# Patient Record
Sex: Female | Born: 1940 | Race: Black or African American | Hispanic: No | Marital: Single | Smoking: Former smoker
Health system: Southern US, Community
[De-identification: ages and names within clinical notes are randomized; demographics above are authoritative.]

## PROBLEM LIST (undated history)

## (undated) DIAGNOSIS — K829 Disease of gallbladder, unspecified: Secondary | ICD-10-CM

## (undated) DIAGNOSIS — I10 Essential (primary) hypertension: Secondary | ICD-10-CM

## (undated) DIAGNOSIS — E538 Deficiency of other specified B group vitamins: Secondary | ICD-10-CM

## (undated) DIAGNOSIS — M7989 Other specified soft tissue disorders: Secondary | ICD-10-CM

## (undated) DIAGNOSIS — E079 Disorder of thyroid, unspecified: Secondary | ICD-10-CM

## (undated) DIAGNOSIS — J449 Chronic obstructive pulmonary disease, unspecified: Secondary | ICD-10-CM

## (undated) DIAGNOSIS — K589 Irritable bowel syndrome without diarrhea: Secondary | ICD-10-CM

## (undated) DIAGNOSIS — E559 Vitamin D deficiency, unspecified: Secondary | ICD-10-CM

## (undated) DIAGNOSIS — G629 Polyneuropathy, unspecified: Secondary | ICD-10-CM

## (undated) DIAGNOSIS — D219 Benign neoplasm of connective and other soft tissue, unspecified: Secondary | ICD-10-CM

## (undated) DIAGNOSIS — E7800 Pure hypercholesterolemia, unspecified: Secondary | ICD-10-CM

## (undated) DIAGNOSIS — I2699 Other pulmonary embolism without acute cor pulmonale: Secondary | ICD-10-CM

## (undated) DIAGNOSIS — F419 Anxiety disorder, unspecified: Secondary | ICD-10-CM

## (undated) DIAGNOSIS — M2550 Pain in unspecified joint: Secondary | ICD-10-CM

## (undated) DIAGNOSIS — K5900 Constipation, unspecified: Secondary | ICD-10-CM

## (undated) DIAGNOSIS — K529 Noninfective gastroenteritis and colitis, unspecified: Secondary | ICD-10-CM

## (undated) DIAGNOSIS — I8390 Asymptomatic varicose veins of unspecified lower extremity: Secondary | ICD-10-CM

## (undated) DIAGNOSIS — F32A Depression, unspecified: Secondary | ICD-10-CM

## (undated) DIAGNOSIS — I82409 Acute embolism and thrombosis of unspecified deep veins of unspecified lower extremity: Secondary | ICD-10-CM

## (undated) DIAGNOSIS — I38 Endocarditis, valve unspecified: Secondary | ICD-10-CM

## (undated) DIAGNOSIS — R0602 Shortness of breath: Secondary | ICD-10-CM

## (undated) DIAGNOSIS — Z8619 Personal history of other infectious and parasitic diseases: Secondary | ICD-10-CM

## (undated) HISTORY — DX: Depression, unspecified: F32.A

## (undated) HISTORY — PX: OOPHORECTOMY: SHX86

## (undated) HISTORY — DX: Irritable bowel syndrome, unspecified: K58.9

## (undated) HISTORY — DX: Disease of gallbladder, unspecified: K82.9

## (undated) HISTORY — DX: Benign neoplasm of connective and other soft tissue, unspecified: D21.9

## (undated) HISTORY — PX: TOTAL THYROIDECTOMY: SHX2547

## (undated) HISTORY — DX: Noninfective gastroenteritis and colitis, unspecified: K52.9

## (undated) HISTORY — DX: Essential (primary) hypertension: I10

## (undated) HISTORY — DX: Personal history of other infectious and parasitic diseases: Z86.19

## (undated) HISTORY — DX: Asymptomatic varicose veins of unspecified lower extremity: I83.90

## (undated) HISTORY — PX: ABDOMINAL HYSTERECTOMY: SHX81

## (undated) HISTORY — DX: Deficiency of other specified B group vitamins: E53.8

## (undated) HISTORY — DX: Chronic obstructive pulmonary disease, unspecified: J44.9

## (undated) HISTORY — DX: Disorder of thyroid, unspecified: E07.9

## (undated) HISTORY — DX: Vitamin D deficiency, unspecified: E55.9

## (undated) HISTORY — DX: Pain in unspecified joint: M25.50

## (undated) HISTORY — DX: Other specified soft tissue disorders: M79.89

## (undated) HISTORY — DX: Shortness of breath: R06.02

## (undated) HISTORY — DX: Polyneuropathy, unspecified: G62.9

## (undated) HISTORY — DX: Pure hypercholesterolemia, unspecified: E78.00

## (undated) HISTORY — DX: Endocarditis, valve unspecified: I38

## (undated) HISTORY — DX: Constipation, unspecified: K59.00

## (undated) HISTORY — DX: Anxiety disorder, unspecified: F41.9

---

## 1998-04-22 ENCOUNTER — Ambulatory Visit (HOSPITAL_COMMUNITY): Admission: RE | Admit: 1998-04-22 | Discharge: 1998-04-22 | Payer: Self-pay | Admitting: Podiatry

## 1998-04-27 ENCOUNTER — Other Ambulatory Visit: Admission: RE | Admit: 1998-04-27 | Discharge: 1998-04-27 | Payer: Self-pay | Admitting: Podiatry

## 1998-10-20 ENCOUNTER — Other Ambulatory Visit: Admission: RE | Admit: 1998-10-20 | Discharge: 1998-10-20 | Payer: Self-pay | Admitting: Obstetrics and Gynecology

## 2000-02-13 ENCOUNTER — Encounter: Admission: RE | Admit: 2000-02-13 | Discharge: 2000-02-13 | Payer: Self-pay | Admitting: Internal Medicine

## 2000-02-13 ENCOUNTER — Encounter: Payer: Self-pay | Admitting: Internal Medicine

## 2000-07-04 ENCOUNTER — Other Ambulatory Visit: Admission: RE | Admit: 2000-07-04 | Discharge: 2000-07-04 | Payer: Self-pay | Admitting: Internal Medicine

## 2003-11-15 ENCOUNTER — Inpatient Hospital Stay (HOSPITAL_COMMUNITY): Admission: EM | Admit: 2003-11-15 | Discharge: 2003-11-16 | Payer: Self-pay | Admitting: Emergency Medicine

## 2003-11-16 ENCOUNTER — Encounter: Payer: Self-pay | Admitting: *Deleted

## 2005-09-24 ENCOUNTER — Encounter: Admission: RE | Admit: 2005-09-24 | Discharge: 2005-09-24 | Payer: Self-pay | Admitting: Internal Medicine

## 2009-05-14 ENCOUNTER — Inpatient Hospital Stay (HOSPITAL_COMMUNITY): Admission: EM | Admit: 2009-05-14 | Discharge: 2009-05-17 | Payer: Self-pay | Admitting: Emergency Medicine

## 2010-03-09 ENCOUNTER — Encounter
Admission: RE | Admit: 2010-03-09 | Discharge: 2010-03-09 | Payer: Self-pay | Source: Home / Self Care | Attending: Internal Medicine | Admitting: Internal Medicine

## 2010-04-27 ENCOUNTER — Ambulatory Visit (HOSPITAL_COMMUNITY)
Admission: RE | Admit: 2010-04-27 | Discharge: 2010-04-27 | Disposition: A | Discharge status: Home / Self Care | Payer: Medicare Other | Source: Ambulatory Visit | Attending: Gastroenterology | Admitting: Gastroenterology

## 2010-04-27 DIAGNOSIS — I10 Essential (primary) hypertension: Secondary | ICD-10-CM | POA: Insufficient documentation

## 2010-04-27 DIAGNOSIS — Z1211 Encounter for screening for malignant neoplasm of colon: Secondary | ICD-10-CM | POA: Insufficient documentation

## 2010-05-01 NOTE — Op Note (Signed)
  NAME:  Denise Cline, Denise Cline NO.:  [id number]  MEDICAL RECORD NO.:  [id number]           PATIENT TYPE:  O  LOCATION:  WLEN                         FACILITY:  [facility]  PHYSICIAN:  Danise Edge, M.D.   DATE OF BIRTH:  1940/04/07  DATE OF PROCEDURE:  04/27/2010 DATE OF DISCHARGE:                              OPERATIVE REPORT   HISTORY:  [name] is a 70 year old female born 1940/10/17.  The patient is scheduled to undergo a screening colonoscopy with polypectomy to prevent colon cancer.  ENDOSCOPIST:  Danise Edge, M.D.  PREMEDICATION:  Fentanyl 75 mcg, Versed 4 mg.  PROCEDURE IN DETAIL:  After obtaining informed consent, the patient was placed in the left lateral decubitus position.  Anal inspection and digital rectal exam were normal.  The Pentax pediatric colonoscope was introduced into the rectum and advanced to the cecum.  Colonic preparation for the exam today was good.  Performance of the colonoscopy was technically difficult due to colonic loop formation.  Anal inspection and digital rectal exam were normal.  Rectum normal.  Retroflexed view of the distal rectum normal. Sigmoid colon and descending colon normal. Splenic flexure normal. Transverse colon normal. Hepatic flexure normal. Ascending colon normal. Cecum and ileocecal valve normal.  ASSESSMENT:  Normal screening proctocolonoscopy to the cecum.  No endoscopic evidence for the presence of colorectal neoplasia.  RECOMMENDATIONS:  Repeat screening colonoscopy is probably not necessary.          ______________________________ Danise Edge, M.D.     MJ/MEDQ  D:  04/27/2010  T:  04/27/2010  Job:  [id number]  cc:   Candyce Churn, M.D. Fax: [phone]  Electronically Signed by Danise Edge M.D. on 05/01/2010 05:04:35 PM

## 2010-05-29 LAB — URINE CULTURE

## 2010-05-29 LAB — HEPATIC FUNCTION PANEL
ALT: 13 U/L (ref 0–35)
AST: 15 U/L (ref 0–37)
Albumin: 3.2 g/dL — ABNORMAL LOW (ref 3.5–5.2)
Alkaline Phosphatase: 34 U/L — ABNORMAL LOW (ref 39–117)
Total Protein: 6.6 g/dL (ref 6.0–8.3)

## 2010-05-29 LAB — DIFFERENTIAL
Basophils Absolute: 0 10*3/uL (ref 0.0–0.1)
Basophils Absolute: 0 10*3/uL (ref 0.0–0.1)
Basophils Absolute: 0 10*3/uL (ref 0.0–0.1)
Basophils Relative: 0 % (ref 0–1)
Basophils Relative: 1 % (ref 0–1)
Eosinophils Absolute: 0 10*3/uL (ref 0.0–0.7)
Eosinophils Relative: 1 % (ref 0–5)
Eosinophils Relative: 2 % (ref 0–5)
Lymphocytes Relative: 46 % (ref 12–46)
Lymphs Abs: 2.3 10*3/uL (ref 0.7–4.0)
Monocytes Absolute: 0.2 10*3/uL (ref 0.1–1.0)
Neutro Abs: 2.3 10*3/uL (ref 1.7–7.7)
Neutro Abs: 2.4 10*3/uL (ref 1.7–7.7)
Neutrophils Relative %: 46 % (ref 43–77)
Neutrophils Relative %: 55 % (ref 43–77)

## 2010-05-29 LAB — CBC
HCT: 36.3 % (ref 36.0–46.0)
HCT: 37.8 % (ref 36.0–46.0)
Platelets: 175 10*3/uL (ref 150–400)
Platelets: 185 10*3/uL (ref 150–400)
Platelets: 187 10*3/uL (ref 150–400)
RBC: 3.68 MIL/uL — ABNORMAL LOW (ref 3.87–5.11)
RDW: 14.5 % (ref 11.5–15.5)
WBC: 4.4 10*3/uL (ref 4.0–10.5)
WBC: 5.1 10*3/uL (ref 4.0–10.5)
WBC: 6.1 10*3/uL (ref 4.0–10.5)

## 2010-05-29 LAB — URINALYSIS, ROUTINE W REFLEX MICROSCOPIC
Bilirubin Urine: NEGATIVE
Hgb urine dipstick: NEGATIVE
Specific Gravity, Urine: 1.013 (ref 1.005–1.030)
pH: 6.5 (ref 5.0–8.0)

## 2010-05-29 LAB — COMPREHENSIVE METABOLIC PANEL
AST: 16 U/L (ref 0–37)
Albumin: 3.3 g/dL — ABNORMAL LOW (ref 3.5–5.2)
Alkaline Phosphatase: 35 U/L — ABNORMAL LOW (ref 39–117)
Alkaline Phosphatase: 41 U/L (ref 39–117)
BUN: 7 mg/dL (ref 6–23)
BUN: 9 mg/dL (ref 6–23)
CO2: 26 mEq/L (ref 19–32)
CO2: 26 mEq/L (ref 19–32)
Chloride: 103 mEq/L (ref 96–112)
Chloride: 107 mEq/L (ref 96–112)
GFR calc Af Amer: >60 mL/min (ref >60)
GFR calc non Af Amer: >60 mL/min (ref >60)
Glucose, Bld: 82 mg/dL (ref 70–99)
Potassium: 3.3 mEq/L — ABNORMAL LOW (ref 3.5–5.1)
Potassium: 3.9 mEq/L (ref 3.5–5.1)
Total Bilirubin: 0.5 mg/dL (ref 0.3–1.2)
Total Bilirubin: 0.5 mg/dL (ref 0.3–1.2)
Total Protein: 7.4 g/dL (ref 6.0–8.3)

## 2010-05-29 LAB — CARDIAC PANEL(CRET KIN+CKTOT+MB+TROPI)
CK, MB: 1.1 ng/mL (ref 0.3–4.0)
CK, MB: 1.1 ng/mL (ref 0.3–4.0)
CK, MB: 1.1 ng/mL (ref 0.3–4.0)
Relative Index: 0.7 (ref 0.0–2.5)
Relative Index: 0.8 (ref 0.0–2.5)
Troponin I: 0.01 ng/mL (ref 0.00–0.06)

## 2010-05-29 LAB — T4: T4, Total: 8 ug/dL (ref 5.0–12.5)

## 2010-05-29 LAB — LIPID PANEL
HDL: 51 mg/dL (ref >39)
Total CHOL/HDL Ratio: 3.7 RATIO
Triglycerides: 112 mg/dL (ref <150)

## 2010-05-29 LAB — URINE MICROSCOPIC-ADD ON

## 2010-05-29 LAB — MAGNESIUM: Magnesium: 1.7 mg/dL (ref 1.5–2.5)

## 2010-05-29 LAB — POCT CARDIAC MARKERS
CKMB, poc: <1 ng/mL — ABNORMAL LOW (ref 1.0–8.0)
Myoglobin, poc: 74.5 ng/mL (ref 12–200)
Troponin i, poc: <0.05 ng/mL (ref 0.00–0.09)

## 2010-05-29 LAB — TSH: TSH: 2.543 u[IU]/mL (ref 0.350–4.500)

## 2010-05-29 LAB — APTT: aPTT: 30 seconds (ref 24–37)

## 2010-05-29 LAB — BASIC METABOLIC PANEL
BUN: 7 mg/dL (ref 6–23)
Chloride: 102 mEq/L (ref 96–112)
GFR calc non Af Amer: >60 mL/min (ref >60)
Potassium: 3.4 mEq/L — ABNORMAL LOW (ref 3.5–5.1)
Sodium: 138 mEq/L (ref 135–145)

## 2010-05-29 LAB — PHOSPHORUS: Phosphorus: 3 mg/dL (ref 2.3–4.6)

## 2010-05-29 LAB — PROTIME-INR
INR: 0.96 (ref 0.00–1.49)
Prothrombin Time: 12.7 seconds (ref 11.6–15.2)

## 2010-07-21 NOTE — H&P (Signed)
NAME:  Denise Cline, Denise Cline NO.:  [id number]   MEDICAL RECORD NO.:  [id number]                   PATIENT TYPE:  EMS   LOCATION:  ED                                   FACILITY:  [facility]   PHYSICIAN:  Candyce Churn, M.D.          DATE OF BIRTH:  1940/12/26   DATE OF ADMISSION:  11/15/2003  DATE OF DISCHARGE:                                HISTORY & PHYSICAL   CHIEF COMPLAINT:  Chest pain.   HISTORY OF PRESENT ILLNESS:  Denise Cline is a very pleasant 70 year old  female with a history of current tobacco use and borderline hypertension  with recent use of Diovan/HCT, who presents with increasing fatigue with  exertion for several months.  She also reports 2-3 pillow orthopnea for  weeks to months and sudden, severe chest pain that occurred this morning at  approximately 3 a.m.  It radiated up into her neck and down her left arm.  It was associated with shortness of breath and diaphoresis.  She had  intermittent chest pain until about 9 a.m. to 10 a.m. and decided to come to  the emergency room at [facility]  Cardiac enzymes have been negative, but  history is good for unstable angina.  Her chest x-ray revealed incipient  CHF, and she was given IV Lasix, which did help her feel less chest  pressure and short of breath.  She was admitted for workup of possible  unstable angina.   MEDICATIONS:  None routinely.  She does occasionally use Advair or Spiriva  for COPD and bronchospasm.  She was recently on Diovan/HCT 100/12.5 1 daily,  but blood pressure was normal on her physical exam on October 25, 2003, and  she had been off her blood pressure medicines for three weeks.  Finally,  occasional Tylenol use for DJD pain.   ALLERGIES:  1.  PENICILLIN causes a rash.  2.  ACE INHIBITORS cause hoarseness.   She can take ARBs okay.   PAST MEDICAL HISTORY:  1.  Hypertension.  2.  Anxiety.  3.  Tobacco use for 20-30 years.  4.  Mild eczema.  5.  Episodic  bronchitis.  6.  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   PAST SURGICAL HISTORY:  1.  Partial thyroidectomy in 1991.  (Since the patient's partial      thyroidectomy, she has euthyroid).  2.  TAH and unilateral oophorectomy.  3.  Bone spur removal from right fifth metatarsal.  4.  Right bunionectomy.   HEALTH MAINTENANCE:  Patient's last mammogram was in June, 2004 at [place].   FAMILY HISTORY:  Positive for stroke, hypertension, and either ulcerative  colitis or Crohn's disease in her mother.  She does not know her father's  family history.   HABITS:  Smokes about a pack per day for more than 20-30 years.  No alcohol  use currently.   SOCIAL HISTORY:  Retired from [employer] in March, 2003.  Worked there for  many years.  She has not been sexually active for several years.  She is  married but separated from her husband.  Her sister, Denise Cline, was  present in the [facility] ER and can be reached at [phone] or [phone].   REVIEW OF SYSTEMS:  Denies abdominal pain, fever or chills.  Otherwise as  per HPI.   PHYSICAL EXAMINATION:  VITAL SIGNS:  Blood pressure 130/96, pulse 58 and  regular, temperature 98.4, respiratory rate 16 and nonlabored.  O2  saturation is 98% on 2 liters.  GENERAL:  She is alert, oriented, comfortable.  HEENT:  Atraumatic and normocephalic.  She does wear glasses.  Oropharynx is  clear.  NECK:  Supple without JVD at 30 degrees.  No obvious bruits.  LUNGS:  Clear to auscultation after intravenous Lasix.  HEART:  Regular rhythm.  No murmurs or gallops.  The rate is decreased at  approximately 50.  ABDOMEN:  Soft and nontender.  Bowel sounds are normal.  EXTREMITIES:  Without edema.  Good capillary refill.  NEUROLOGIC:  Oriented x3.  Nonfocal.  SKIN:  Without rash.   Chest x-ray reveals question of incipient CHF.  Borderline increase in  heart size.   EKG reveals sinus bradycardia at 52 and is a normal EKG.   White count 5200 with a  differential of 52% neutrophils, hemoglobin 11.8,  platelets 246,000.  Sodium 138, potassium 3.8, chloride 105, bicarb 29, BUN  10, creatinine 0.8, glucose 87.  D-dimer is normal at 0.45.  LFTs are  normal.  Cardiac markers at 3 p.m. and 4 p.m. revealed a myoglobin of 55.1  and 68.9, respectively.  CK-MB and troponin I were negative with less than  1.0 and less than 0.05 respectively x2.  BNP was normal at 41.   ASSESSMENT:  A 70 year old female with a history of tobacco use and  hypertension but not hypercholesterolemia or diabetes.  She does run a high  HDL and low HDL cholesterol.  She has symptoms suggestive of cardiac  ischemia and question of transient congestive heart failure.   PLAN:  1.  Admit, monitor, and treat with subcu Lovenox as well as sublingual      nitroglycerin p.r.n.  Also treat with aspirin and O2.  2.  Check serial cardiac enzymes and 2D echo.  3.  Consult cardiology.  4.  Resume Diovan.  Diastolic values of 6.  5.  Be careful with beta blockers with a history of bronchospasm and      recurrent bradycardia.  6.  Xopenex for wheezing p.r.n. at half strength.  7.  Wellbutrin SR 150 mg daily and Xanax 0.25 mg daily for tobacco      cessation.  8.  Check TSH because of history of thyroidectomy.  Will also check      homocysteine level and fasting lipid profile.                                               Candyce Churn, M.D.    RNG/MEDQ  D:  11/15/2003  T:  11/15/2003  Job:  [id number]   cc:   Candace Cruise, M.D.

## 2010-07-21 NOTE — Discharge Summary (Signed)
NAMEYOLANDE, SKODA NO.:  [id number]   MEDICAL RECORD NO.:  [id number]          PATIENT TYPE:  INP   LOCATION:  0351                         FACILITY:  [facility]   PHYSICIAN:  Candyce Churn, M.D.DATE OF BIRTH:  March 07, 1940   DATE OF ADMISSION:  11/15/2003  DATE OF DISCHARGE:  11/16/2003                                 DISCHARGE SUMMARY   PROBLEM:  1.  Chest pain syndrome-likely secondary to reflux esophagitis/esophageal      spasm.  2.  Hypertension.  3.  Anxiety.  4.  History of tobacco use.  5.  Eczema.  6.  History of episodic bronchitis with bronchospasm.  7.  Moderate degenerative joint disease.   DISCHARGE MEDICATIONS:  1.  Diovan Hctz 1/12.5  2.  Occasional Advair or Spiriva use for bronchospasm.   ALLERGIES:  PENICILLIN causes a rash.  ACE INHIBITORS cause hoarseness.  She  tolerates ARBs well.   CONSULTATIONS:  Cardiology, Dr. Hillary Bow.   PROCEDURES:  Adenosine Cardiolite performed on 11/16/03.  No changes to  suggest myocardial ischemia.  The patient was unable to read target heart  rates secondary to fatigue.  On stress testing, Adenosine Cardiolite had to  be performed.   HOSPITAL COURSE:  Ms. Hoskie is a pleasant 70 year old female with a history  of recurrent tobacco use and borderline elevations in the blood pressure.  She takes diabetes seriously only episodically.  She presented on 11/15/03,  complaining of increased fatigue with exertion for months and 2-3 pillow  orthopnea for weeks to months.  She had a subsevere onset of chest pain that  radiated up her neck and down her left arm intermittently associated with  increased shortness of breath and diaphoresis on the date of admission.  She  states she has been having some intermittent chest pain all morning long on  the date of admission and presented to the [facility] Emergency Room on  11/15/03.  Cardiac enzymes were negative but she had a good history for  unstable  angina.  A chest x-ray revealed incipient CHF.  Her lungs were  clear on examination.  She was admitted for workup of possible unstable  angina.   Her workup was negative for ischemia and she seemed to respond well to  Protonix-a proton pump inhibitor.   She was discharged at 1305 in improved condition to be followed in my office  in one week.   DISCHARGE LABORATORIES:  White count 5200, hemoglobin 11.8, platelet count  246,000.  D-dimer 0.45-normal, PTT 28.  Homocystine level elevated at 14.25-  we will plan to get a multivitamin with folate as an  outpatient.  Lipid profile on 11/16/03, revealed a total cholesterol of 195,  triglycerides 84, LDL 120, HDL 58, ratio of 3.4.  TSH was 2.894-normal.   Chest x-ray.  EKG on 11/16/03, revealed sinus bradycardia at 46 beats/min on  11/16/03 with no changes.     [place]   RNG/MEDQ  D:  01/15/2004  T:  01/16/2004  Job:  [id number]   cc:   Meade Maw, M.D.  [phone]  [place]  [place] [address]  Fax: [phone]

## 2010-07-21 NOTE — Consult Note (Signed)
NAME:  Denise Cline, Denise Cline                       ACCOUNT NO.:  [id number]   MEDICAL RECORD NO.:  [id number]                   PATIENT TYPE:  INP   LOCATION:  0351                                 [facility]ACILITY:  [facility]   PHYSICIAN:  Meade Maw, M.D.                 DATE OF BIRTH:  [birth year]   DATE OF CONSULTATION:  11/16/2003  DATE OF DISCHARGE:                                   CONSULTATION   REFERRING PHYSICIAN:  Candyce Churn, M.D.   REASON FOR CONSULTATION:  Chest pain.   HISTORY OF PRESENT ILLNESS:  Denise Cline is a pleasant, 70 year old female  who presents to the emergency room with complaints of a chest tightness,  described as a pressure, and generally not feeling well.  She states that  she awoke at approximately 3 a.m. with a sharp chest pain which radiated  into her neck and down her left arm.  She had associated shortness of breath  and diaphoresis.  She subsequently went back to sleep and had recurrent  chest pain approximately at 9 a.m. to 10 a.m. and decided to come to the  emergency room.  Her coronary risk factors are significant for  postmenopausal status, hypertension and chronic tobacco use.  Cholesterol  profile is unknown.   PAST MEDICAL HISTORY:  1.  COPD.  2.  Hypertension.  3.  Anxiety disorder.  4.  Degenerative joint disease.   PAST SURGICAL HISTORY:  1.  Partial thyroidectomy in 1991.  2.  TAH with unilateral oophorectomy.  3.  Partial bunionectomy.   MEDICATIONS PRIOR TO ADMISSION:  1.  Occasional Advair and Spiriva for her COPD.  2.  She had been on Diovan/hydrochlorothiazide in the past.  Her blood      pressure has recently been normal off of medications.  3.  Tylenol p.r.n. for her DJD.   ALLERGIES:  PENICILLIN which results in a rash.  ACE INHIBITORS which  results in hoarseness.   FAMILY HISTORY:  Significant for a CVA, hypertension, ulcerative colitis.  She does not know her father's past history.   SOCIAL HISTORY:  She smokes one  pack per day for more than 20-30 years.  No  history of alcohol.  She is retired in March 2000.  She has been separated  from her husband for a significant amount of time.  She has increased stress  in her family related to her adult children as well as to her husband.   REVIEW OF SYSTEMS:  GASTROINTESTINAL:  No abdominal pain.  CONSTITUTIONAL:  No fevers or chills.  CARDIOPULMONARY:  No peripheral edema.  No  palpitations.  No tachycardia arrhythmia.  No presyncope or syncope.   PHYSICAL EXAMINATION:  GENERAL:  Elderly female in no acute distress.  VITAL SIGNS:  Blood pressure 126/73, heart rate 50, afebrile, O2 saturations  96% on room air.  HEENT:  Unremarkable.  NECK:  No carotid bruits.  No neck  vein distention.  LUNGS:  Breath sounds are equal and clear to auscultation.  No use of  accessory muscles.  CARDIAC:  Regular rate and rhythm, normal S1, S2.  No murmurs, rubs or  gallops.  PMI not displaced.  ABDOMEN:  Soft, benign, nontender.  No unusual bruits or pulsations are  noted.  EXTREMITIES:  No peripheral edema.  SKIN:  Warm and dry.  NEUROLOGIC:  Nonfocal.   LABORATORY DATA AND X-RAY FINDINGS:  White count 5.2, hemoglobin 11.8,  platelet count 246.  D-dimer 0.4.  Sodium 138, potassium 3.8, creatinine  0.8.  Normal liver enzymes.  Serial cardiac enzymes have been negative.  Troponin I is less than 0.01.  Her BNP was 41.   Chest x-ray reveals borderline heart size.  Her ECG reveals a sinus  bradycardia and normal ECG.   IMPRESSION:  A 70 year old female with chest pain.  The chest pain has  typical and atypical features.  Of note, the patient has been working in her  yard over the past weekend without exacerbation of chest pain.  Her serial  enzymes are negative.  There is no inducible ischemia by her  electrocardiogram.  Her B-type natriuretic peptide was noted to be normal.   RECOMMENDATIONS:  1.  Will schedule for a stress Cardiolite for further evaluation.  2.   Agree with ongoing therapy.  3.  Aspirin 325 mg daily.  4.  Blood pressure currently well-controlled on Avapro.  5.  For possible GE reflux, agree with ongoing use of Protonix.  6.  The patient is currently on Xopenex for COPD.  7.  Further recommendations pending the outcome of her stress Cardiolite.                                               Meade Maw, M.D.    HP/MEDQ  D:  11/16/2003  T:  11/16/2003  Job:  [id number]

## 2012-02-18 ENCOUNTER — Other Ambulatory Visit (HOSPITAL_COMMUNITY)
Admission: RE | Admit: 2012-02-18 | Discharge: 2012-02-18 | Disposition: A | Discharge status: Home / Self Care | Payer: Medicare Other | Source: Ambulatory Visit | Attending: Obstetrics and Gynecology | Admitting: Obstetrics and Gynecology

## 2012-02-18 ENCOUNTER — Encounter: Payer: Self-pay | Admitting: Obstetrics and Gynecology

## 2012-02-18 ENCOUNTER — Ambulatory Visit (INDEPENDENT_AMBULATORY_CARE_PROVIDER_SITE_OTHER): Payer: Medicare Other | Admitting: Obstetrics and Gynecology

## 2012-02-18 VITALS — BP 140/94 | Ht 65.5 in | Wt 198.0 lb

## 2012-02-18 DIAGNOSIS — R209 Unspecified disturbances of skin sensation: Secondary | ICD-10-CM

## 2012-02-18 DIAGNOSIS — Z01419 Encounter for gynecological examination (general) (routine) without abnormal findings: Secondary | ICD-10-CM | POA: Insufficient documentation

## 2012-02-18 DIAGNOSIS — I10 Essential (primary) hypertension: Secondary | ICD-10-CM | POA: Insufficient documentation

## 2012-02-18 DIAGNOSIS — N952 Postmenopausal atrophic vaginitis: Secondary | ICD-10-CM

## 2012-02-18 DIAGNOSIS — R232 Flushing: Secondary | ICD-10-CM

## 2012-02-18 DIAGNOSIS — Z8619 Personal history of other infectious and parasitic diseases: Secondary | ICD-10-CM | POA: Insufficient documentation

## 2012-02-18 DIAGNOSIS — D219 Benign neoplasm of connective and other soft tissue, unspecified: Secondary | ICD-10-CM | POA: Insufficient documentation

## 2012-02-18 DIAGNOSIS — R238 Other skin changes: Secondary | ICD-10-CM

## 2012-02-18 DIAGNOSIS — F419 Anxiety disorder, unspecified: Secondary | ICD-10-CM | POA: Insufficient documentation

## 2012-02-18 DIAGNOSIS — R200 Anesthesia of skin: Secondary | ICD-10-CM

## 2012-02-18 DIAGNOSIS — E079 Disorder of thyroid, unspecified: Secondary | ICD-10-CM | POA: Insufficient documentation

## 2012-02-18 DIAGNOSIS — Z1272 Encounter for screening for malignant neoplasm of vagina: Secondary | ICD-10-CM

## 2012-02-18 DIAGNOSIS — N951 Menopausal and female climacteric states: Secondary | ICD-10-CM

## 2012-02-18 NOTE — Progress Notes (Signed)
The patient is a 71 year old gravida 3 para 3 AB 0 who came to see me today for followup. We have not seen her since the year 2000 so it was treated as a new patient visit. Sometime in the 1990s we did a total abdominal hysterectomy on her for fibroids with severe metromenorrhagia and possible pelvic inflammatory disease. She thinks we removed just one ovary. Her records have been destroyed since has been greater than 10 years since we saw her. 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 She has not had a Pap smear since 2000 but all her Paps were normal after cryosurgery. She is having no pelvic pain. She is having no vaginal bleeding. Dr. Kevan Ny got her mammogram last year. She has had normal bone densities in his office. She does have atrophic vaginitis but is not sexually active. She still has hot flashes but she says they're almost gone and they are tolerable. She previously was on HRT. She is up-to-date on colonoscopy. She says her left leg remains cold  and has paresthesias.  ROS: 12 system review done. Pertinent positives above. Other positives include hypertension, shingles, anxiety and hypothyroidism.  HEENT: Within normal limits.Kennon Portela present.Neck: No masses. Supraclavicular lymph nodes: Not enlarged. Breasts: Examined in both sitting and lying position. Symmetrical without skin changes or masses. Abdomen: Soft no masses guarding or rebound. No hernias. Pelvic: External within normal limits. BUS within normal limits. Vaginal examination shows poor  estrogen effect, no cystocele enterocele or rectocele. Cervix and uterus absent. Adnexa within normal limits. Rectovaginal confirmatory. Extremities within normal limits. Both legs equal temperature. Pulses present. Patient does have some brawny edema of left ankle.  Assessment: #1. Hot flashes-resolving #2. Atrophic vaginitis #3. Possible circulatory problems of left leg #4.  Hypertension  Plan: Mammogram. Referral to Dr. Hart Rochester. We will get her operative note and pathology report from the hospital. Selena Batten will call her and tell her whether we removed her ovaries are not. Pap done.The new Pap smear guidelines were discussed with the patient. Patient to get blood pressure rechecked.

## 2012-02-18 NOTE — Patient Instructions (Addendum)
Your blood pressure was slightly high today. Please get it checked again. Please go to see Dr. Hart Rochester about your  leg. Schedule mammogram.

## 2012-02-22 ENCOUNTER — Encounter: Payer: Self-pay | Admitting: Obstetrics and Gynecology

## 2012-02-25 ENCOUNTER — Telehealth: Payer: Self-pay | Admitting: *Deleted

## 2012-02-25 NOTE — Telephone Encounter (Signed)
appt with Dr. Hart Rochester on Jan 28th @ 1:00 pm. Left message on voicemail with time and date.

## 2012-02-25 NOTE — Telephone Encounter (Signed)
REFERRAL FAXED TO DR. Hart Rochester OFFICE. THEY WILL LET ME KNOW ONCE APPT. SCHEDULED.

## 2012-02-28 ENCOUNTER — Other Ambulatory Visit: Payer: Self-pay | Admitting: *Deleted

## 2012-02-28 DIAGNOSIS — R609 Edema, unspecified: Secondary | ICD-10-CM

## 2012-03-31 ENCOUNTER — Encounter: Payer: Self-pay | Admitting: Vascular Surgery

## 2012-04-01 ENCOUNTER — Encounter (INDEPENDENT_AMBULATORY_CARE_PROVIDER_SITE_OTHER): Payer: Medicare Other | Admitting: *Deleted

## 2012-04-01 ENCOUNTER — Ambulatory Visit (INDEPENDENT_AMBULATORY_CARE_PROVIDER_SITE_OTHER): Payer: Medicare Other | Admitting: Vascular Surgery

## 2012-04-01 ENCOUNTER — Encounter: Payer: Self-pay | Admitting: Vascular Surgery

## 2012-04-01 VITALS — BP 166/103 | HR 53 | Resp 20 | Ht 66.5 in | Wt 198.0 lb

## 2012-04-01 DIAGNOSIS — I83893 Varicose veins of bilateral lower extremities with other complications: Secondary | ICD-10-CM

## 2012-04-01 DIAGNOSIS — R609 Edema, unspecified: Secondary | ICD-10-CM

## 2012-04-01 DIAGNOSIS — M79609 Pain in unspecified limb: Secondary | ICD-10-CM

## 2012-04-01 DIAGNOSIS — I739 Peripheral vascular disease, unspecified: Secondary | ICD-10-CM | POA: Insufficient documentation

## 2012-04-01 DIAGNOSIS — M7989 Other specified soft tissue disorders: Secondary | ICD-10-CM

## 2012-04-01 DIAGNOSIS — M79606 Pain in leg, unspecified: Secondary | ICD-10-CM | POA: Insufficient documentation

## 2012-04-01 NOTE — Progress Notes (Signed)
Subjective:     Patient ID: Denise Cline, female   DOB: 08-02-1940, 72 y.o.   MRN: [id number]  HPI this 72 year old female was referred by Dr. Oletha Blend for evaluation of left leg edema in discomfort. This patient states that she has had a superficial blood clot in the left calf in the past. She would not treated with "blood thinners". She has no known history of DVT. She developed swelling in the left leg as the day progresses. Her leg becomes very "jumpy" at night. She has no history of stasis ulcers bleeding or bulging varicosities. Right leg is asymptomatic.  Past Medical History  Diagnosis Date  . History of shingles   . Hypertension   . Thyroid disease     Nodules  . Fibroid   . Anxiety   . Varicose veins   . COPD (chronic obstructive pulmonary disease)     History  Substance Use Topics  . Smoking status: Former Smoker -- 40 years    Types: Cigarettes    Quit date: 05/30/2008  . Smokeless tobacco: Never Used  . Alcohol Use: No     Comment: Rare    Family History  Problem Relation Age of Onset  . Crohn's disease Mother   . Stroke Father   . Diabetes Sister   . Hypertension Sister   . Hypertension Brother     Allergies  Allergen Reactions  . Oxycodone   . Penicillins Swelling    Current outpatient prescriptions:valsartan (DIOVAN) 80 MG tablet, Take 80 mg by mouth daily., Disp: , Rfl: ;  Vitamin D, Ergocalciferol, (DRISDOL) 50000 UNITS CAPS, Take 50,000 Units by mouth every 7 (seven) days., Disp: , Rfl:   BP 166/103  Pulse 53  Resp 20  Ht 5' 6.5" (1.689 m)  Wt 198 lb (89.812 kg)  BMI 31.48 kg/m2  Body mass index is 31.48 kg/(m^2).           Review of Systems denies chest pain, dyspnea on exertion, PND, orthopnea, hemoptysis, claudication. Does complain of history of depression, and swelling    Objective:   Physical Exam blood pressure 166/103 heart rate 53 respirations 20 Gen.-alert and oriented x3 in no apparent distress HEENT normal for  age Lungs no rhonchi or wheezing Cardiovascular regular rhythm no murmurs carotid pulses 3+ palpable no bruits audible Abdomen soft nontender no palpable masses Musculoskeletal free of  major deformities Skin clear -no rashes Neurologic normal Lower extremities 3+ femoral and dorsalis pedis pulses palpable bilaterally with left calf 2 cm larger in circumference than right calf. No thigh edema noted. Both feet well perfused. No bulging varicosities noted.  Today I ordered a venous duplex exam of the left leg which are reviewed and interpreted. 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

## 2013-01-21 ENCOUNTER — Ambulatory Visit
Admission: RE | Admit: 2013-01-21 | Discharge: 2013-01-21 | Disposition: A | Discharge status: Home / Self Care | Payer: Medicare Other | Source: Ambulatory Visit | Attending: Internal Medicine | Admitting: Internal Medicine

## 2013-01-21 ENCOUNTER — Other Ambulatory Visit: Payer: Self-pay | Admitting: Internal Medicine

## 2013-01-21 DIAGNOSIS — J449 Chronic obstructive pulmonary disease, unspecified: Secondary | ICD-10-CM

## 2013-06-03 ENCOUNTER — Emergency Department (HOSPITAL_COMMUNITY)
Admission: EM | Admit: 2013-06-03 | Discharge: 2013-06-03 | Disposition: A | Discharge status: Home / Self Care | Payer: Medicare Other | Attending: Emergency Medicine | Admitting: Emergency Medicine

## 2013-06-03 ENCOUNTER — Emergency Department (HOSPITAL_COMMUNITY): Payer: Medicare Other

## 2013-06-03 ENCOUNTER — Encounter (HOSPITAL_COMMUNITY): Payer: Self-pay | Admitting: Emergency Medicine

## 2013-06-03 DIAGNOSIS — Z880 Allergy status to penicillin: Secondary | ICD-10-CM | POA: Insufficient documentation

## 2013-06-03 DIAGNOSIS — J40 Bronchitis, not specified as acute or chronic: Secondary | ICD-10-CM

## 2013-06-03 DIAGNOSIS — Z87891 Personal history of nicotine dependence: Secondary | ICD-10-CM | POA: Insufficient documentation

## 2013-06-03 DIAGNOSIS — Z8639 Personal history of other endocrine, nutritional and metabolic disease: Secondary | ICD-10-CM | POA: Insufficient documentation

## 2013-06-03 DIAGNOSIS — Z8659 Personal history of other mental and behavioral disorders: Secondary | ICD-10-CM | POA: Insufficient documentation

## 2013-06-03 DIAGNOSIS — J441 Chronic obstructive pulmonary disease with (acute) exacerbation: Secondary | ICD-10-CM | POA: Insufficient documentation

## 2013-06-03 DIAGNOSIS — Z8742 Personal history of other diseases of the female genital tract: Secondary | ICD-10-CM | POA: Insufficient documentation

## 2013-06-03 DIAGNOSIS — Z79899 Other long term (current) drug therapy: Secondary | ICD-10-CM | POA: Insufficient documentation

## 2013-06-03 DIAGNOSIS — Z862 Personal history of diseases of the blood and blood-forming organs and certain disorders involving the immune mechanism: Secondary | ICD-10-CM | POA: Insufficient documentation

## 2013-06-03 DIAGNOSIS — Z8619 Personal history of other infectious and parasitic diseases: Secondary | ICD-10-CM | POA: Insufficient documentation

## 2013-06-03 DIAGNOSIS — I10 Essential (primary) hypertension: Secondary | ICD-10-CM | POA: Insufficient documentation

## 2013-06-03 MED ORDER — BENZONATATE 100 MG PO CAPS: 100.0000 mg | ORAL_CAPSULE | Freq: Three times a day (TID) | ORAL | Status: DC

## 2013-06-03 MED ORDER — IPRATROPIUM BROMIDE 0.02 % IN SOLN
0.5000 mg | Freq: Once | RESPIRATORY_TRACT | Status: AC
  Administered 2013-06-03: 0.5 mg via RESPIRATORY_TRACT
  Filled 2013-06-03: qty 2.5

## 2013-06-03 MED ORDER — ALBUTEROL SULFATE HFA 108 (90 BASE) MCG/ACT IN AERS
1.0000 | INHALATION_SPRAY | RESPIRATORY_TRACT | Status: DC | PRN
  Administered 2013-06-03: 2 via RESPIRATORY_TRACT
  Filled 2013-06-03: qty 6.7

## 2013-06-03 MED ORDER — ALBUTEROL SULFATE (2.5 MG/3ML) 0.083% IN NEBU
5.0000 mg | INHALATION_SOLUTION | RESPIRATORY_TRACT | Status: DC | PRN
  Administered 2013-06-03: 5 mg via RESPIRATORY_TRACT
  Filled 2013-06-03: qty 6

## 2013-06-03 MED ORDER — GUAIFENESIN ER 1200 MG PO TB12: 1.0000 | ORAL_TABLET | Freq: Two times a day (BID) | ORAL | Status: DC

## 2013-06-03 MED ORDER — PREDNISONE 50 MG PO TABS: 50.0000 mg | ORAL_TABLET | Freq: Every day | ORAL | Status: DC

## 2013-06-03 NOTE — ED Notes (Signed)
Respiratory therapist called for breathing treatment.

## 2013-06-03 NOTE — ED Provider Notes (Signed)
CSN: [id number]     Arrival date & time 06/03/13  6010 History  First MD Initiated Contact with Patient 06/03/13 [phone]     Chief Complaint  Patient presents with  . Cough  . Chills   Patient is a 73 y.o. female presenting with cough. The history is provided by the patient.  Cough Cough characteristics:  Non-productive Severity:  Moderate Onset quality:  Gradual Duration:  4 days Timing:  Constant Progression:  Worsening Chronicity:  New Context: upper respiratory infection   Relieved by:  Nothing Ineffective treatments:  Cough suppressants (nyquil) Associated symptoms: shortness of breath and wheezing   Associated symptoms: no chest pain and no fever   It feels like she caught the flu.  She has been coughing a lot but cant bring anything up.  Her mouth feels dry.  She did not see her doctor because it is hard to get in the office.  Past Medical History  Diagnosis Date  . History of shingles   . Hypertension   . Thyroid disease     Nodules  . Fibroid   . Anxiety   . Varicose veins   . COPD (chronic obstructive pulmonary disease)    Past Surgical History  Procedure Laterality Date  . Abdominal hysterectomy    . Oophorectomy      One ovary removed  . Thyroid nodules    . Thyroid surgery     Family History  Problem Relation Age of Onset  . Crohn's disease Mother   . Stroke Father   . Diabetes Sister   . Hypertension Sister   . Hypertension Brother    History  Substance Use Topics  . Smoking status: Former Smoker -- 40 years    Types: Cigarettes    Quit date: 05/30/2008  . Smokeless tobacco: Former Systems developer    Quit date: 05/12/2008  . Alcohol Use: No     Comment: Rare   OB History   Grav Para Term Preterm Abortions TAB SAB Ect Mult Living   3 3 3       3      Review of Systems  Constitutional: Negative for fever.  Respiratory: Positive for cough, shortness of breath and wheezing.   Cardiovascular: Negative for chest pain.  All other systems reviewed and are  negative.      Allergies  Oxycodone and Penicillins  Home Medications   Current Outpatient Rx  Name  Route  Sig  Dispense  Refill  . valsartan (DIOVAN) 80 MG tablet   Oral   Take 80 mg by mouth daily.         . Vitamin D, Ergocalciferol, (DRISDOL) 50000 UNITS CAPS   Oral   Take 50,000 Units by mouth every 7 (seven) days.          BP 169/104  Pulse 89  Temp(Src) 99 F (37.2 C) (Oral)  Resp 20  SpO2 94% Physical Exam  Nursing note and vitals reviewed. Constitutional: She appears well-developed and well-nourished. No distress.  HENT:  Head: Normocephalic and atraumatic.  Right Ear: External ear normal.  Left Ear: External ear normal.  Eyes: Conjunctivae are normal. Right eye exhibits no discharge. Left eye exhibits no discharge. No scleral icterus.  Neck: Neck supple. No tracheal deviation present.  Cardiovascular: Normal rate, regular rhythm and intact distal pulses.   Pulmonary/Chest: Effort normal and breath sounds normal. No stridor. No respiratory distress. She has no decreased breath sounds. She has no wheezes. She has no rhonchi. She has no  rales.  Frequent cough   Abdominal: Soft. Bowel sounds are normal. She exhibits no distension. There is no tenderness. There is no rebound and no guarding.  Musculoskeletal: She exhibits no edema and no tenderness.  Neurological: She is alert. She has normal strength. No cranial nerve deficit (no facial droop, extraocular movements intact, no slurred speech) or sensory deficit. She exhibits normal muscle tone. She displays no seizure activity. Coordination normal.  Skin: Skin is warm and dry. No rash noted.  Psychiatric: She has a normal mood and affect.    ED Course  Procedures (including critical care time) Imaging Review Dg Chest 2 View  06/03/2013   CLINICAL DATA:  Cough and chills.  EXAM: CHEST  2 VIEW  COMPARISON:  01/21/2013  FINDINGS: There is chronic stable cardiomegaly. Pulmonary vascularity is normal. There is  peribronchial thickening with diffuse accentuation of the interstitial markings suggestive of bronchitis. There is minimal chronic scarring at the left lung base. No effusions.  IMPRESSION: Prominent bronchitic changes.   Electronically Signed   By: Rozetta Nunnery M.D.   On: 06/03/2013 08:11   Medications  albuterol (PROVENTIL) (2.5 MG/3ML) 0.083% nebulizer solution 5 mg (5 mg Nebulization Given 06/03/13 0728)  albuterol (PROVENTIL HFA;VENTOLIN HFA) 108 (90 BASE) MCG/ACT inhaler 1-2 puff (not administered)  ipratropium (ATROVENT) nebulizer solution 0.5 mg (0.5 mg Nebulization Given 06/03/13 0728)   0900  Repeat exam.  Sporadic wheeze noted on end expiration.   NO tachypnea.  No labored breathing.   MDM   Final diagnoses:  Bronchitis    Pt with history of COPD.  No PNA on xray.  No tachypnea.  Occsnl wheeze noted on repeat exam.  Pt has not been using an inhaler.  Will dc with oral steroids and inhaler.  Rec follow up with PCP next week.      Kathalene Frames, MD 06/03/13 [phone]

## 2013-06-03 NOTE — Discharge Instructions (Signed)
Antibiotic Nonuse  Your caregiver felt that the infection or problem was not one that would be helped with an antibiotic. Infections may be caused by viruses or bacteria. Only a caregiver can tell which one of these is the likely cause of an illness. A cold is the most common cause of infection in both adults and children. A cold is a virus. Antibiotic treatment will have no effect on a viral infection. Viruses can lead to many lost days of work caring for sick children and many missed days of school. Children may catch as many as 10 "colds" or "flus" per year during which they can be tearful, cranky, and uncomfortable. The goal of treating a virus is aimed at keeping the ill person comfortable. Antibiotics are medications used to help the body fight bacterial infections. There are relatively few types of bacteria that cause infections but there are hundreds of viruses. While both viruses and bacteria cause infection they are very different types of germs. A viral infection will typically go away by itself within 7 to 10 days. Bacterial infections may spread or get worse without antibiotic treatment. Examples of bacterial infections are:  Sore throats (like strep throat or tonsillitis).  Infection in the lung (pneumonia).  Ear and skin infections. Examples of viral infections are:  Colds or flus.  Most coughs and bronchitis.  Sore throats not caused by Strep.  Runny noses. It is often best not to take an antibiotic when a viral infection is the cause of the problem. Antibiotics can kill off the helpful bacteria that we have inside our body and allow harmful bacteria to start growing. Antibiotics can cause side effects such as allergies, nausea, and diarrhea without helping to improve the symptoms of the viral infection. Additionally, repeated uses of antibiotics can cause bacteria inside of our body to become resistant. That resistance can be passed onto harmful bacterial. The next time you have  an infection it may be harder to treat if antibiotics are used when they are not needed. Not treating with antibiotics allows our own immune system to develop and take care of infections more efficiently. Also, antibiotics will work better for [place] when they are prescribed for bacterial infections. Treatments for a child that is ill may include:  Give extra fluids throughout the day to stay hydrated.  Get plenty of rest.  Only give your child over-the-counter or prescription medicines for pain, discomfort, or fever as directed by your caregiver.  The use of a cool mist humidifier may help stuffy noses.  Cold medications if suggested by your caregiver. Your caregiver may decide to start you on an antibiotic if:  The problem you were seen for today continues for a longer length of time than expected.  You develop a secondary bacterial infection. SEEK MEDICAL CARE IF:  Fever lasts longer than 5 days.  Symptoms continue to get worse after 5 to 7 days or become severe.  Difficulty in breathing develops.  Signs of dehydration develop (poor drinking, rare urinating, dark colored urine).  Changes in behavior or worsening tiredness (listlessness or lethargy). Document Released: 04/30/2001 Document Revised: 05/14/2011 Document Reviewed: 10/27/2008 [facility] Patient Information 2014 [place], [place].  Bronchitis Bronchitis is inflammation of the airways that extend from the windpipe into the lungs (bronchi). The inflammation often causes mucus to develop, which leads to a cough. If the inflammation becomes severe, it may cause shortness of breath. CAUSES  Bronchitis may be caused by:   Viral infections.   Bacteria.  Cigarette smoke.   °· Allergens, pollutants, and other irritants.   °SIGNS AND SYMPTOMS  °The most common symptom of bronchitis is a frequent cough that produces mucus. Other symptoms include: °· Fever.   °· Body aches.   °· Chest congestion.   °· Chills.   °· Shortness of  breath.   °· Sore throat.   °DIAGNOSIS  °Bronchitis is usually diagnosed through a medical history and physical exam. Tests, such as chest X-rays, are sometimes done to rule out other conditions.  °TREATMENT  °You may need to avoid contact with whatever caused the problem (smoking, for example). Medicines are sometimes needed. These may include: °· Antibiotics. These may be prescribed if the condition is caused by bacteria. °· Cough suppressants. These may be prescribed for relief of cough symptoms.   °· Inhaled medicines. These may be prescribed to help open your airways and make it easier for you to breathe.   °· Steroid medicines. These may be prescribed for those with recurrent (chronic) bronchitis. °HOME CARE INSTRUCTIONS °· Get plenty of rest.   °· Drink enough fluids to keep your urine clear or pale yellow (unless you have a medical condition that requires fluid restriction). Increasing fluids may help thin your secretions and will prevent dehydration.   °· Only take over-the-counter or prescription medicines as directed by your health care provider. °· Only take antibiotics as directed. Make sure you finish them even if you start to feel better. °· Avoid secondhand smoke, irritating chemicals, and strong fumes. These will make bronchitis worse. If you are a smoker, quit smoking. Consider using nicotine gum or skin patches to help control withdrawal symptoms. Quitting smoking will help your lungs heal faster.   °· Put a cool-mist humidifier in your bedroom at night to moisten the air. This may help loosen mucus. Change the water in the humidifier daily. You can also run the hot water in your shower and sit in the bathroom with the door closed for 5 10 minutes.   °· Follow up with your health care provider as directed.   °· Wash your hands frequently to avoid catching bronchitis again or spreading an infection to others.   °SEEK MEDICAL CARE IF: °Your symptoms do not improve after 1 week of treatment.  °SEEK  IMMEDIATE MEDICAL CARE IF: °· Your fever increases. °· You have chills.   °· You have chest pain.   °· You have worsening shortness of breath.   °· You have bloody sputum. °· You faint.   °· You have lightheadedness. °· You have a severe headache.   °· You vomit repeatedly. °MAKE SURE YOU:  °· Understand these instructions. °· Will watch your condition. °· Will get help right away if you are not doing well or get worse. °Document Released: 02/19/2005 Document Revised: 12/10/2012 Document Reviewed: 10/14/2012 °[employer]® Patient Information ©2014 [employer]. ° °

## 2013-06-03 NOTE — ED Notes (Signed)
Per pt report: pt c/o of cough and chills since Saturday.  Pt a/o x 4.  Skin warm and dry. Pt has a strong unproductive cough. Pt states "I feel like an 18 wheeler ran over me" and "I can't stuff to come up."

## 2013-06-30 ENCOUNTER — Emergency Department (HOSPITAL_COMMUNITY): Payer: Medicare Other

## 2013-06-30 ENCOUNTER — Emergency Department (HOSPITAL_COMMUNITY)
Admission: EM | Admit: 2013-06-30 | Discharge: 2013-06-30 | Disposition: A | Discharge status: Home / Self Care | Payer: Medicare Other | Attending: Emergency Medicine | Admitting: Emergency Medicine

## 2013-06-30 ENCOUNTER — Encounter (HOSPITAL_COMMUNITY): Payer: Self-pay | Admitting: Emergency Medicine

## 2013-06-30 DIAGNOSIS — Z79899 Other long term (current) drug therapy: Secondary | ICD-10-CM | POA: Insufficient documentation

## 2013-06-30 DIAGNOSIS — N390 Urinary tract infection, site not specified: Secondary | ICD-10-CM | POA: Insufficient documentation

## 2013-06-30 DIAGNOSIS — R55 Syncope and collapse: Secondary | ICD-10-CM | POA: Insufficient documentation

## 2013-06-30 DIAGNOSIS — J449 Chronic obstructive pulmonary disease, unspecified: Secondary | ICD-10-CM | POA: Insufficient documentation

## 2013-06-30 DIAGNOSIS — Z8639 Personal history of other endocrine, nutritional and metabolic disease: Secondary | ICD-10-CM | POA: Insufficient documentation

## 2013-06-30 DIAGNOSIS — Z87891 Personal history of nicotine dependence: Secondary | ICD-10-CM | POA: Insufficient documentation

## 2013-06-30 DIAGNOSIS — I10 Essential (primary) hypertension: Secondary | ICD-10-CM | POA: Insufficient documentation

## 2013-06-30 DIAGNOSIS — Z862 Personal history of diseases of the blood and blood-forming organs and certain disorders involving the immune mechanism: Secondary | ICD-10-CM | POA: Insufficient documentation

## 2013-06-30 DIAGNOSIS — Z8619 Personal history of other infectious and parasitic diseases: Secondary | ICD-10-CM | POA: Insufficient documentation

## 2013-06-30 DIAGNOSIS — J4489 Other specified chronic obstructive pulmonary disease: Secondary | ICD-10-CM | POA: Insufficient documentation

## 2013-06-30 DIAGNOSIS — Z880 Allergy status to penicillin: Secondary | ICD-10-CM | POA: Insufficient documentation

## 2013-06-30 DIAGNOSIS — R1110 Vomiting, unspecified: Secondary | ICD-10-CM | POA: Insufficient documentation

## 2013-06-30 DIAGNOSIS — Z8659 Personal history of other mental and behavioral disorders: Secondary | ICD-10-CM | POA: Insufficient documentation

## 2013-06-30 DIAGNOSIS — Z7982 Long term (current) use of aspirin: Secondary | ICD-10-CM | POA: Insufficient documentation

## 2013-06-30 DIAGNOSIS — Z8742 Personal history of other diseases of the female genital tract: Secondary | ICD-10-CM | POA: Insufficient documentation

## 2013-06-30 LAB — URINE MICROSCOPIC-ADD ON

## 2013-06-30 LAB — COMPREHENSIVE METABOLIC PANEL
ALK PHOS: 48 U/L (ref 39–117)
ALT: 15 U/L (ref 0–35)
AST: 17 U/L (ref 0–37)
Albumin: 3.5 g/dL (ref 3.5–5.2)
BUN: 21 mg/dL (ref 6–23)
CALCIUM: 9.1 mg/dL (ref 8.4–10.5)
CO2: 26 meq/L (ref 19–32)
Chloride: 105 mEq/L (ref 96–112)
Creatinine, Ser: 0.79 mg/dL (ref 0.50–1.10)
GFR calc non Af Amer: 79 mL/min — ABNORMAL LOW (ref >90)
GLUCOSE: 115 mg/dL — AB (ref 70–99)
POTASSIUM: 4.1 meq/L (ref 3.7–5.3)
SODIUM: 141 meq/L (ref 137–147)
TOTAL PROTEIN: 7 g/dL (ref 6.0–8.3)
Total Bilirubin: 0.2 mg/dL — ABNORMAL LOW (ref 0.3–1.2)

## 2013-06-30 LAB — CBG MONITORING, ED: GLUCOSE-CAPILLARY: 107 mg/dL — AB (ref 70–99)

## 2013-06-30 LAB — I-STAT CHEM 8, ED
BUN: 21 mg/dL (ref 6–23)
CHLORIDE: 104 meq/L (ref 96–112)
CREATININE: 0.9 mg/dL (ref 0.50–1.10)
Calcium, Ion: 1.21 mmol/L (ref 1.13–1.30)
Glucose, Bld: 110 mg/dL — ABNORMAL HIGH (ref 70–99)
HCT: 40 % (ref 36.0–46.0)
Hemoglobin: 13.6 g/dL (ref 12.0–15.0)
POTASSIUM: 4 meq/L (ref 3.7–5.3)
SODIUM: 143 meq/L (ref 137–147)
TCO2: 26 mmol/L (ref 0–100)

## 2013-06-30 LAB — URINALYSIS, ROUTINE W REFLEX MICROSCOPIC
Bilirubin Urine: NEGATIVE
GLUCOSE, UA: NEGATIVE mg/dL
HGB URINE DIPSTICK: NEGATIVE
Ketones, ur: NEGATIVE mg/dL
Nitrite: POSITIVE — AB
PROTEIN: NEGATIVE mg/dL
SPECIFIC GRAVITY, URINE: 1.018 (ref 1.005–1.030)
UROBILINOGEN UA: 0.2 mg/dL (ref 0.0–1.0)
pH: 6.5 (ref 5.0–8.0)

## 2013-06-30 LAB — CBC
HCT: 37.4 % (ref 36.0–46.0)
HEMOGLOBIN: 12.6 g/dL (ref 12.0–15.0)
MCH: 31.8 pg (ref 26.0–34.0)
MCHC: 33.7 g/dL (ref 30.0–36.0)
MCV: 94.4 fL (ref 78.0–100.0)
PLATELETS: 238 10*3/uL (ref 150–400)
RBC: 3.96 MIL/uL (ref 3.87–5.11)
RDW: 14.2 % (ref 11.5–15.5)
WBC: 4.2 10*3/uL (ref 4.0–10.5)

## 2013-06-30 MED ORDER — SULFAMETHOXAZOLE-TMP DS 800-160 MG PO TABS
1.0000 | ORAL_TABLET | Freq: Once | ORAL | Status: AC
  Administered 2013-06-30: 1 via ORAL
  Filled 2013-06-30: qty 1

## 2013-06-30 MED ORDER — SULFAMETHOXAZOLE-TRIMETHOPRIM 800-160 MG PO TABS: 1.0000 | ORAL_TABLET | Freq: Two times a day (BID) | ORAL | Status: DC

## 2013-06-30 MED ORDER — SODIUM CHLORIDE 0.9 % IV BOLUS (SEPSIS)
500.0000 mL | Freq: Once | INTRAVENOUS | Status: AC
  Administered 2013-06-30: 500 mL via INTRAVENOUS

## 2013-06-30 MED ORDER — DIPHENHYDRAMINE HCL 50 MG/ML IJ SOLN
25.0000 mg | Freq: Once | INTRAMUSCULAR | Status: AC
  Administered 2013-06-30: 25 mg via INTRAVENOUS
  Filled 2013-06-30: qty 1

## 2013-06-30 MED ORDER — METOCLOPRAMIDE HCL 10 MG PO TABS: 10.0000 mg | ORAL_TABLET | Freq: Four times a day (QID) | ORAL | Status: DC | PRN

## 2013-06-30 MED ORDER — METOCLOPRAMIDE HCL 5 MG/ML IJ SOLN
10.0000 mg | Freq: Once | INTRAMUSCULAR | Status: AC
  Administered 2013-06-30: 10 mg via INTRAVENOUS
  Filled 2013-06-30: qty 2

## 2013-06-30 NOTE — ED Notes (Signed)
Patient denies pain, says she just doesn't feel right.  She is nauseated but not actively vomiting,.

## 2013-06-30 NOTE — Discharge Instructions (Signed)
Take bactrim twice a day for a week.   Take reglan for nausea.   Stay hydrated.   Follow up with your doctor.   Return to ER if you have passing out, vomiting, fever.

## 2013-06-30 NOTE — ED Notes (Signed)
Bed: WA02 Expected date:  Expected time:  Means of arrival:  Comments: 

## 2013-06-30 NOTE — ED Provider Notes (Signed)
CSN: [id number]     Arrival date & time 06/30/13  1102 History   First MD Initiated Contact with Patient 06/30/13 1205     Chief Complaint  Patient presents with  . Near Syncope     (Consider location/radiation/quality/duration/timing/severity/associated sxs/prior Treatment) The history is provided by the patient.  Denise Cline is a 73 y.o. female hx of shingles, HTN, fibroids, COPD here with near syncope, headache, vomiting. Vocal this morning and felt lightheaded and dizzy. She felt like she'll pass out but didn't pass out. She has some headache and nausea and vomiting. Denies any fevers or chills or chest pain or abdominal pain. She was here a month ago and was diagnosed with COPD exacerbation but denies any more coughing or short of breath.    Past Medical History  Diagnosis Date  . History of shingles   . Hypertension   . Thyroid disease     Nodules  . Fibroid   . Anxiety   . Varicose veins   . COPD (chronic obstructive pulmonary disease)    Past Surgical History  Procedure Laterality Date  . Abdominal hysterectomy    . Oophorectomy      One ovary removed  . Thyroid nodules    . Thyroid surgery     Family History  Problem Relation Age of Onset  . Crohn's disease Mother   . Stroke Father   . Diabetes Sister   . Hypertension Sister   . Hypertension Brother    History  Substance Use Topics  . Smoking status: Former Smoker -- 40 years    Types: Cigarettes    Quit date: 05/30/2008  . Smokeless tobacco: Former Systems developer    Quit date: 05/12/2008  . Alcohol Use: No     Comment: Rare   OB History   Grav Para Term Preterm Abortions TAB SAB Ect Mult Living   3 3 3       3      Review of Systems  Cardiovascular: Positive for near-syncope.  Gastrointestinal: Positive for vomiting.  Neurological: Positive for dizziness.  All other systems reviewed and are negative.     Allergies  Penicillins and Oxycodone  Home Medications   Prior to Admission medications    Medication Sig Start Date End Date Taking? Authorizing Provider  aspirin 325 MG tablet Take 325 mg by mouth daily.   Yes Historical Provider, MD  Multiple Vitamin (MULTIVITAMIN WITH MINERALS) TABS tablet Take 1 tablet by mouth daily.   Yes Historical Provider, MD  valsartan (DIOVAN) 80 MG tablet Take 80 mg by mouth daily.   Yes Historical Provider, MD   BP 149/91  Pulse 59  Temp(Src) 97.6 F (36.4 C) (Oral)  Resp 20  SpO2 99% Physical Exam  Nursing note and vitals reviewed. Constitutional: She is oriented to person, place, and time.  Chronically ill, NAD   HENT:  Head: Normocephalic.  Mouth/Throat: Oropharynx is clear and moist.  Eyes: Conjunctivae are normal. Pupils are equal, round, and reactive to light.  Neck: Normal range of motion. Neck supple.  Cardiovascular: Normal rate, regular rhythm and normal heart sounds.   Pulmonary/Chest: Effort normal and breath sounds normal. No respiratory distress. She has no wheezes. She has no rales.  Abdominal: Soft. Bowel sounds are normal. She exhibits no distension. There is no tenderness. There is no rebound and no guarding.  Musculoskeletal: Normal range of motion. She exhibits no edema and no tenderness.  Neurological: She is alert and oriented to person, place, and time.  No cranial nerve deficit. Coordination normal.  Nl strength and sensation throughout   Skin: Skin is warm and dry.  Psychiatric: She has a normal mood and affect. Her behavior is normal. Judgment and thought content normal.    ED Course  Procedures (including critical care time) Labs Review Labs Reviewed  URINALYSIS, ROUTINE W REFLEX MICROSCOPIC - Abnormal; Notable for the following:    APPearance CLOUDY (*)    Nitrite POSITIVE (*)    Leukocytes, UA LARGE (*)    All other components within normal limits  COMPREHENSIVE METABOLIC PANEL - Abnormal; Notable for the following:    Glucose, Bld 115 (*)    Total Bilirubin 0.2 (*)    GFR calc non Af Amer 79 (*)    All  other components within normal limits  URINE MICROSCOPIC-ADD ON - Abnormal; Notable for the following:    Squamous Epithelial / LPF MANY (*)    Bacteria, UA MANY (*)    All other components within normal limits  CBG MONITORING, ED - Abnormal; Notable for the following:    Glucose-Capillary 107 (*)    All other components within normal limits  I-STAT CHEM 8, ED - Abnormal; Notable for the following:    Glucose, Bld 110 (*)    All other components within normal limits  CBC    Imaging Review Dg Chest 2 View  06/30/2013   CLINICAL DATA:  Weakness, extremity tingling. Prior history of smoking and COPD  EXAM: CHEST  2 VIEW  COMPARISON:  Prior chest x-ray 06/03/2013  FINDINGS: Stable cardiomegaly. Mediastinal contours are unchanged. No focal airspace consolidation, pulmonary edema, pleural effusion or pneumothorax. Stable mild bronchitic changes. No acute osseous abnormality.  IMPRESSION: Stable chest x-ray without evidence of acute cardiopulmonary process.   Electronically Signed   By: Jacqulynn Cadet M.D.   On: 06/30/2013 13:54   Ct Head Wo Contrast  06/30/2013   CLINICAL DATA:  Dizziness, nausea/vomiting  EXAM: CT HEAD WITHOUT CONTRAST  TECHNIQUE: Contiguous axial images were obtained from the base of the skull through the vertex without intravenous contrast.  COMPARISON:  None.  FINDINGS: No evidence of parenchymal hemorrhage or extra-axial fluid collection. No mass lesion, mass effect, or midline shift.  No CT evidence of acute infarction.  Mild subcortical white matter and periventricular small vessel ischemic changes, including in the subcortical left parietal lobe (series 2/image 19).  Cerebral volume is within normal limits.  No ventriculomegaly.  The visualized paranasal sinuses are essentially clear. The mastoid air cells are unopacified.  No evidence of calvarial fracture.  IMPRESSION: No evidence of acute intracranial abnormality.  Mild small vessel ischemic changes.   Electronically  Signed   By: Julian Hy M.D.   On: 06/30/2013 13:36     EKG Interpretation   Date/Time:  Tuesday June 30 2013 11:33:20 EDT Ventricular Rate:  54 PR Interval:  141 QRS Duration: 77 QT Interval:  460 QTC Calculation: 436 R Axis:   11 Text Interpretation:  Sinus arrhythmia Baseline wander in lead(s) V2 No  significant change since last tracing Confirmed by Quantae Martel  MD, Rayyan Burley ([id number])  on 06/30/2013 12:05:58 PM      MDM   Final diagnoses:  None   [name] is a 73 y.o. female here with near syncope. Will get orthostatics, labs, EKG. Will check UA, CXR. No chest pain or shortness of breath so I doubt PE or dissection or ACS.   2:31 PM Not orthostatic. UA + UTI and has history of  this. Previous urine culture sensitive to amoxicillin, ceftriaxone, and bactrim. 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 Labs at baseline. Felt better. Will d/c home on bactrim.      Wandra Arthurs, MD 06/30/13 1452

## 2013-06-30 NOTE — ED Notes (Signed)
Pt states that when she woke up this morning to get out of bed she felt dizzy and faint. Pt then got nauseated and vomited twice.  Pt has HTN but hasnt taken her meds this am.

## 2013-07-03 ENCOUNTER — Emergency Department (HOSPITAL_COMMUNITY)
Admission: EM | Admit: 2013-07-03 | Discharge: 2013-07-03 | Disposition: A | Discharge status: Home / Self Care | Payer: Medicare Other | Attending: Emergency Medicine | Admitting: Emergency Medicine

## 2013-07-03 ENCOUNTER — Encounter (HOSPITAL_COMMUNITY): Payer: Self-pay | Admitting: Emergency Medicine

## 2013-07-03 DIAGNOSIS — J449 Chronic obstructive pulmonary disease, unspecified: Secondary | ICD-10-CM | POA: Insufficient documentation

## 2013-07-03 DIAGNOSIS — T7840XA Allergy, unspecified, initial encounter: Secondary | ICD-10-CM

## 2013-07-03 DIAGNOSIS — Z792 Long term (current) use of antibiotics: Secondary | ICD-10-CM | POA: Insufficient documentation

## 2013-07-03 DIAGNOSIS — T370X5A Adverse effect of sulfonamides, initial encounter: Secondary | ICD-10-CM | POA: Insufficient documentation

## 2013-07-03 DIAGNOSIS — Z880 Allergy status to penicillin: Secondary | ICD-10-CM | POA: Insufficient documentation

## 2013-07-03 DIAGNOSIS — Z87891 Personal history of nicotine dependence: Secondary | ICD-10-CM | POA: Insufficient documentation

## 2013-07-03 DIAGNOSIS — J4489 Other specified chronic obstructive pulmonary disease: Secondary | ICD-10-CM | POA: Insufficient documentation

## 2013-07-03 DIAGNOSIS — Z8619 Personal history of other infectious and parasitic diseases: Secondary | ICD-10-CM | POA: Insufficient documentation

## 2013-07-03 DIAGNOSIS — N390 Urinary tract infection, site not specified: Secondary | ICD-10-CM | POA: Insufficient documentation

## 2013-07-03 DIAGNOSIS — Z7982 Long term (current) use of aspirin: Secondary | ICD-10-CM | POA: Insufficient documentation

## 2013-07-03 DIAGNOSIS — R21 Rash and other nonspecific skin eruption: Secondary | ICD-10-CM | POA: Insufficient documentation

## 2013-07-03 DIAGNOSIS — F411 Generalized anxiety disorder: Secondary | ICD-10-CM | POA: Insufficient documentation

## 2013-07-03 DIAGNOSIS — Z79899 Other long term (current) drug therapy: Secondary | ICD-10-CM | POA: Insufficient documentation

## 2013-07-03 DIAGNOSIS — I10 Essential (primary) hypertension: Secondary | ICD-10-CM | POA: Insufficient documentation

## 2013-07-03 DIAGNOSIS — E860 Dehydration: Secondary | ICD-10-CM | POA: Insufficient documentation

## 2013-07-03 LAB — URINE MICROSCOPIC-ADD ON

## 2013-07-03 LAB — URINALYSIS, ROUTINE W REFLEX MICROSCOPIC
Glucose, UA: NEGATIVE mg/dL
HGB URINE DIPSTICK: NEGATIVE
KETONES UR: NEGATIVE mg/dL
Nitrite: NEGATIVE
PROTEIN: 30 mg/dL — AB
Specific Gravity, Urine: 1.021 (ref 1.005–1.030)
Urobilinogen, UA: 1 mg/dL (ref 0.0–1.0)
pH: 5.5 (ref 5.0–8.0)

## 2013-07-03 LAB — BASIC METABOLIC PANEL
BUN: 27 mg/dL — ABNORMAL HIGH (ref 6–23)
CALCIUM: 8.1 mg/dL — AB (ref 8.4–10.5)
CO2: 23 mEq/L (ref 19–32)
Chloride: 97 mEq/L (ref 96–112)
Creatinine, Ser: 1.41 mg/dL — ABNORMAL HIGH (ref 0.50–1.10)
GFR, EST AFRICAN AMERICAN: 41 mL/min — AB (ref >90)
GFR, EST NON AFRICAN AMERICAN: 35 mL/min — AB (ref >90)
GLUCOSE: 108 mg/dL — AB (ref 70–99)
POTASSIUM: 5 meq/L (ref 3.7–5.3)
SODIUM: 133 meq/L — AB (ref 137–147)

## 2013-07-03 LAB — CBC WITH DIFFERENTIAL/PLATELET
BASOS ABS: 0 10*3/uL (ref 0.0–0.1)
BASOS PCT: 0 % (ref 0–1)
EOS ABS: 0.6 10*3/uL (ref 0.0–0.7)
EOS PCT: 5 % (ref 0–5)
HCT: 36.9 % (ref 36.0–46.0)
Hemoglobin: 12.6 g/dL (ref 12.0–15.0)
LYMPHS ABS: 1.5 10*3/uL (ref 0.7–4.0)
Lymphocytes Relative: 13 % (ref 12–46)
MCH: 31.7 pg (ref 26.0–34.0)
MCHC: 34.1 g/dL (ref 30.0–36.0)
MCV: 92.9 fL (ref 78.0–100.0)
Monocytes Absolute: 0.4 10*3/uL (ref 0.1–1.0)
Monocytes Relative: 3 % (ref 3–12)
NEUTROS PCT: 79 % — AB (ref 43–77)
Neutro Abs: 9.5 10*3/uL — ABNORMAL HIGH (ref 1.7–7.7)
Platelets: 145 10*3/uL — ABNORMAL LOW (ref 150–400)
RBC: 3.97 MIL/uL (ref 3.87–5.11)
RDW: 14.5 % (ref 11.5–15.5)
WBC: 12 10*3/uL — ABNORMAL HIGH (ref 4.0–10.5)

## 2013-07-03 MED ORDER — CIPROFLOXACIN HCL 500 MG PO TABS: 500.0000 mg | ORAL_TABLET | Freq: Two times a day (BID) | ORAL | Status: DC

## 2013-07-03 MED ORDER — METHYLPREDNISOLONE SODIUM SUCC 125 MG IJ SOLR
125.0000 mg | Freq: Once | INTRAMUSCULAR | Status: AC
  Administered 2013-07-03: 125 mg via INTRAVENOUS
  Filled 2013-07-03: qty 2

## 2013-07-03 MED ORDER — SODIUM CHLORIDE 0.9 % IV BOLUS (SEPSIS)
500.0000 mL | Freq: Once | INTRAVENOUS | Status: AC
  Administered 2013-07-03: 500 mL via INTRAVENOUS

## 2013-07-03 MED ORDER — FAMOTIDINE IN NACL 20-0.9 MG/50ML-% IV SOLN
20.0000 mg | Freq: Once | INTRAVENOUS | Status: AC
  Administered 2013-07-03: 20 mg via INTRAVENOUS
  Filled 2013-07-03: qty 50

## 2013-07-03 MED ORDER — SODIUM CHLORIDE 0.9 % IV SOLN
INTRAVENOUS | Status: DC
  Administered 2013-07-03: 18:00:00 via INTRAVENOUS

## 2013-07-03 MED ORDER — DIPHENHYDRAMINE HCL 50 MG/ML IJ SOLN
25.0000 mg | Freq: Once | INTRAMUSCULAR | Status: AC
  Administered 2013-07-03: 25 mg via INTRAVENOUS
  Filled 2013-07-03: qty 1

## 2013-07-03 MED ORDER — SODIUM CHLORIDE 0.9 % IV SOLN
INTRAVENOUS | Status: DC
  Administered 2013-07-03: 17:00:00 via INTRAVENOUS

## 2013-07-03 MED ORDER — PREDNISONE 10 MG PO TABS: 20.0000 mg | ORAL_TABLET | Freq: Every day | ORAL | Status: DC

## 2013-07-03 NOTE — ED Notes (Signed)
Per patient and son-was recently treated here on Tuesday for upper respiratory infection with sulfa abx. Today noted generalized redness and swelling. 1+ generalized edema noted. No open wounds. MD in to see patient and son to explain plan of care. Patient A&O x4. VSS. In NAD.

## 2013-07-03 NOTE — Discharge Instructions (Signed)
Use Benadryl as directed and return here for any increased itching Anaphylactic Reaction An anaphylactic reaction is a sudden, severe allergic reaction that involves the whole body. It can be life threatening. A hospital stay is often required. People with asthma, eczema, or hay fever are slightly more likely to have an anaphylactic reaction. CAUSES  An anaphylactic reaction may be caused by anything to which you are allergic. After being exposed to the allergic substance, your immune system becomes sensitized to it. When you are exposed to that allergic substance again, an allergic reaction can occur. Common causes of an anaphylactic reaction include:  Medicines.  Foods, especially peanuts, wheat, shellfish, milk, and eggs.  Insect bites or stings.  Blood products.  Chemicals, such as dyes, latex, and contrast material used for imaging tests. SYMPTOMS  When an allergic reaction occurs, the body releases histamine and other substances. These substances cause symptoms such as tightening of the airway. Symptoms often develop within seconds or minutes of exposure. Symptoms may include:  Skin rash or hives.  Itching.  Chest tightness.  Swelling of the eyes, tongue, or lips.  Trouble breathing or swallowing.  Lightheadedness or fainting.  Anxiety or confusion.  Stomach pains, vomiting, or diarrhea.  Nasal congestion.  A fast or irregular heartbeat (palpitations). DIAGNOSIS  Diagnosis is based on your history of recent exposure to allergic substances, your symptoms, and a physical exam. Your caregiver may also perform blood or urine tests to confirm the diagnosis. TREATMENT  Epinephrine medicine is the main treatment for an anaphylactic reaction. Other medicines that may be used for treatment include antihistamines, steroids, and albuterol. In severe cases, fluids and medicine to support blood pressure may be given through an intravenous line (IV). Even if you improve after  treatment, you need to be observed to make sure your condition does not get worse. This may require a stay in the hospital. [place] a medical alert bracelet or necklace stating your allergy.  You and your family must learn how to use an anaphylaxis kit or give an epinephrine injection to temporarily treat an emergency allergic reaction. Always carry your epinephrine injection or anaphylaxis kit with you. This can be lifesaving if you have a severe reaction.  Do not drive or perform tasks after treatment until the medicines used to treat your reaction have worn off, or until your caregiver says it is okay.  If you have hives or a rash:  Take medicines as directed by your caregiver.  You may use an over-the-counter antihistamine (diphenhydramine) as needed.  Apply cold compresses to the skin or take baths in cool water. Avoid hot baths or showers. SEEK MEDICAL CARE IF:   You develop symptoms of an allergic reaction to a new substance. Symptoms may start right away or minutes later.  You develop a rash, hives, or itching.  You develop new symptoms. SEEK IMMEDIATE MEDICAL CARE IF:   You have swelling of the mouth, difficulty breathing, or wheezing.  You have a tight feeling in your chest or throat.  You develop hives, swelling, or itching all over your body.  You develop severe vomiting or diarrhea.  You feel faint or pass out. This is an emergency. Use your epinephrine injection or anaphylaxis kit as you have been instructed. Call your local emergency services (911 in [place] Even if you improve after the injection, you need to be examined at a hospital emergency department. MAKE SURE YOU:   Understand these instructions.  Will watch your  condition.  Will get help right away if you are not doing well or get worse. Document Released: 02/19/2005 Document Revised: 08/21/2011 Document Reviewed: 05/23/2011 [facility] Patient Information 2014 [place],  [place]. Urinary Tract Infection Urinary tract infections (UTIs) can develop anywhere along your urinary tract. Your urinary tract is your body's drainage system for removing wastes and extra water. Your urinary tract includes two kidneys, two ureters, a bladder, and a urethra. Your kidneys are a pair of bean-shaped organs. Each kidney is about the size of your fist. They are located below your ribs, one on each side of your spine. CAUSES Infections are caused by microbes, which are microscopic organisms, including fungi, viruses, and bacteria. These organisms are so small that they can only be seen through a microscope. Bacteria are the microbes that most commonly cause UTIs. SYMPTOMS  Symptoms of UTIs may vary by age and gender of the patient and by the location of the infection. Symptoms in young women typically include a frequent and intense urge to urinate and a painful, burning feeling in the bladder or urethra during urination. Older women and men are more likely to be tired, shaky, and weak and have muscle aches and abdominal pain. A fever may mean the infection is in your kidneys. Other symptoms of a kidney infection include pain in your back or sides below the ribs, nausea, and vomiting. DIAGNOSIS To diagnose a UTI, your caregiver will ask you about your symptoms. Your caregiver also will ask to provide a urine sample. The urine sample will be tested for bacteria and white blood cells. White blood cells are made by your body to help fight infection. TREATMENT  Typically, UTIs can be treated with medication. Because most UTIs are caused by a bacterial infection, they usually can be treated with the use of antibiotics. The choice of antibiotic and length of treatment depend on your symptoms and the type of bacteria causing your infection. HOME CARE INSTRUCTIONS  If you were prescribed antibiotics, take them exactly as your caregiver instructs you. Finish the medication even if you feel better after  you have only taken some of the medication.  Drink enough water and fluids to keep your urine clear or pale yellow.  Avoid caffeine, tea, and carbonated beverages. They tend to irritate your bladder.  Empty your bladder often. Avoid holding urine for long periods of time.  Empty your bladder before and after sexual intercourse.  After a bowel movement, women should cleanse from front to back. Use each tissue only once. SEEK MEDICAL CARE IF:   You have back pain.  You develop a fever.  Your symptoms do not begin to resolve within 3 days. SEEK IMMEDIATE MEDICAL CARE IF:   You have severe back pain or lower abdominal pain.  You develop chills.  You have nausea or vomiting.  You have continued burning or discomfort with urination. MAKE SURE YOU:   Understand these instructions.  Will watch your condition.  Will get help right away if you are not doing well or get worse. Document Released: 11/29/2004 Document Revised: 08/21/2011 Document Reviewed: 03/30/2011 [facility] Patient Information 2014 [place].

## 2013-07-03 NOTE — ED Notes (Signed)
Patient diagnosed recently here with UTI, given Bactrim and now having red rash and swelling in extremities.  No respiratory distress.

## 2013-07-03 NOTE — ED Provider Notes (Signed)
CSN: [id number]     Arrival date & time 07/03/13  1559 History   First MD Initiated Contact with Patient 07/03/13 1619     Chief Complaint  Patient presents with  . Allergic Reaction     (Consider location/radiation/quality/duration/timing/severity/associated sxs/prior Treatment) Patient is a 73 y.o. female presenting with allergic reaction. The history is provided by the patient and a relative.  Allergic Reaction  patient here complaining of diffuse whole-body rash characterized as being red and associated with swelling in her arms or legs. Has recently been started on Bactrim for UTI. No fever or chills. No trouble swallowing. No dyspnea. Symptoms persisted and no treatment used prior to arrival. No history of sulfa allergy according to the patient. Nothing makes her symptoms better or worse. Past Medical History  Diagnosis Date  . History of shingles   . Hypertension   . Thyroid disease     Nodules  . Fibroid   . Anxiety   . Varicose veins   . COPD (chronic obstructive pulmonary disease)    Past Surgical History  Procedure Laterality Date  . Abdominal hysterectomy    . Oophorectomy      One ovary removed  . Thyroid nodules    . Thyroid surgery     Family History  Problem Relation Age of Onset  . Crohn's disease Mother   . Stroke Father   . Diabetes Sister   . Hypertension Sister   . Hypertension Brother    History  Substance Use Topics  . Smoking status: Former Smoker -- 40 years    Types: Cigarettes    Quit date: 05/30/2008  . Smokeless tobacco: Former Systems developer    Quit date: 05/12/2008  . Alcohol Use: No     Comment: Rare   OB History   Grav Para Term Preterm Abortions TAB SAB Ect Mult Living   3 3 3       3      Review of Systems  All other systems reviewed and are negative.     Allergies  Penicillins; Sulfa antibiotics; and Oxycodone  Home Medications   Prior to Admission medications   Medication Sig Start Date End Date Taking? Authorizing Provider   acetaminophen (TYLENOL) 500 MG tablet Take 500 mg by mouth every 6 (six) hours as needed for headache.   Yes Historical Provider, MD  aspirin 325 MG tablet Take 325 mg by mouth daily.   Yes Historical Provider, MD  metoCLOPramide (REGLAN) 10 MG tablet Take 1 tablet (10 mg total) by mouth every 6 (six) hours as needed for nausea. 06/30/13  Yes Wandra Arthurs, MD  sulfamethoxazole-trimethoprim (BACTRIM DS,SEPTRA DS) 800-160 MG per tablet Take 1 tablet by mouth every 12 (twelve) hours.   Yes Historical Provider, MD  valsartan (DIOVAN) 80 MG tablet Take 80 mg by mouth daily.   Yes Historical Provider, MD  Multiple Vitamin (MULTIVITAMIN WITH MINERALS) TABS tablet Take 1 tablet by mouth daily.    Historical Provider, MD   BP 102/66  Pulse 91  Temp(Src) 99.3 F (37.4 C) (Oral)  Resp 18  SpO2 99% Physical Exam  Nursing note and vitals reviewed. Constitutional: She is oriented to person, place, and time. She appears well-developed and well-nourished.  Non-toxic appearance. No distress.  HENT:  Head: Normocephalic and atraumatic.  Eyes: Conjunctivae, EOM and lids are normal. Pupils are equal, round, and reactive to light.  Neck: Normal range of motion. Neck supple. No tracheal deviation present. No mass present.  Cardiovascular: Normal rate, regular  rhythm and normal heart sounds.  Exam reveals no gallop.   No murmur heard. Pulmonary/Chest: Effort normal and breath sounds normal. No stridor. No respiratory distress. She has no decreased breath sounds. She has no wheezes. She has no rhonchi. She has no rales.  Abdominal: Soft. Normal appearance and bowel sounds are normal. She exhibits no distension. There is no tenderness. There is no rebound and no CVA tenderness.  Musculoskeletal: Normal range of motion. She exhibits no edema and no tenderness.  Neurological: She is alert and oriented to person, place, and time. She has normal strength. No cranial nerve deficit or sensory deficit. GCS eye subscore is  4. GCS verbal subscore is 5. GCS motor subscore is 6.  Skin: Skin is warm and dry. Rash noted. No abrasion noted. Rash is macular.  Diffuse macular rash noted on patient's trunk and legs.  Psychiatric: She has a normal mood and affect. Her speech is normal and behavior is normal.    ED Course  Procedures (including critical care time) Labs Review Labs Reviewed  URINALYSIS, ROUTINE W REFLEX MICROSCOPIC    Imaging Review No results found.   EKG Interpretation None      MDM   Final diagnoses:  None    Patient given Benadryl and site meds on Pepcid here for suspected allergic reaction to sulfa medications. No evidence of Stevens-Johnson syndrome. Patient given IV fluids here. Creatinine is elevated and patient is dehydrated. She also has evidence of continued urinary tract infection. Patient to be placed on Cipro for her UTI and she is stable for discharge    Leota Jacobsen, MD 07/03/13 [phone]

## 2014-01-04 ENCOUNTER — Encounter (HOSPITAL_COMMUNITY): Payer: Self-pay | Admitting: Emergency Medicine

## 2014-10-04 ENCOUNTER — Observation Stay (HOSPITAL_COMMUNITY)
Admission: EM | Admit: 2014-10-04 | Discharge: 2014-10-06 | Disposition: A | Discharge status: Home / Self Care | Payer: Medicare Other | Attending: Internal Medicine | Admitting: Internal Medicine

## 2014-10-04 ENCOUNTER — Encounter (HOSPITAL_COMMUNITY): Payer: Self-pay | Admitting: Emergency Medicine

## 2014-10-04 ENCOUNTER — Emergency Department (HOSPITAL_COMMUNITY): Payer: Medicare Other

## 2014-10-04 DIAGNOSIS — Z880 Allergy status to penicillin: Secondary | ICD-10-CM | POA: Insufficient documentation

## 2014-10-04 DIAGNOSIS — I10 Essential (primary) hypertension: Secondary | ICD-10-CM | POA: Diagnosis not present

## 2014-10-04 DIAGNOSIS — I739 Peripheral vascular disease, unspecified: Secondary | ICD-10-CM | POA: Diagnosis not present

## 2014-10-04 DIAGNOSIS — F419 Anxiety disorder, unspecified: Secondary | ICD-10-CM | POA: Insufficient documentation

## 2014-10-04 DIAGNOSIS — J42 Unspecified chronic bronchitis: Secondary | ICD-10-CM | POA: Diagnosis not present

## 2014-10-04 DIAGNOSIS — Z882 Allergy status to sulfonamides status: Secondary | ICD-10-CM | POA: Diagnosis not present

## 2014-10-04 DIAGNOSIS — Z7982 Long term (current) use of aspirin: Secondary | ICD-10-CM | POA: Diagnosis not present

## 2014-10-04 DIAGNOSIS — K625 Hemorrhage of anus and rectum: Secondary | ICD-10-CM | POA: Insufficient documentation

## 2014-10-04 DIAGNOSIS — N390 Urinary tract infection, site not specified: Secondary | ICD-10-CM | POA: Insufficient documentation

## 2014-10-04 DIAGNOSIS — Z885 Allergy status to narcotic agent status: Secondary | ICD-10-CM | POA: Diagnosis not present

## 2014-10-04 DIAGNOSIS — Z8379 Family history of other diseases of the digestive system: Secondary | ICD-10-CM | POA: Insufficient documentation

## 2014-10-04 DIAGNOSIS — K550 Acute vascular disorders of intestine: Principal | ICD-10-CM | POA: Insufficient documentation

## 2014-10-04 DIAGNOSIS — E039 Hypothyroidism, unspecified: Secondary | ICD-10-CM | POA: Diagnosis not present

## 2014-10-04 DIAGNOSIS — R109 Unspecified abdominal pain: Secondary | ICD-10-CM | POA: Insufficient documentation

## 2014-10-04 DIAGNOSIS — J449 Chronic obstructive pulmonary disease, unspecified: Secondary | ICD-10-CM | POA: Diagnosis not present

## 2014-10-04 DIAGNOSIS — Z87891 Personal history of nicotine dependence: Secondary | ICD-10-CM | POA: Insufficient documentation

## 2014-10-04 DIAGNOSIS — K529 Noninfective gastroenteritis and colitis, unspecified: Secondary | ICD-10-CM | POA: Diagnosis present

## 2014-10-04 DIAGNOSIS — I8390 Asymptomatic varicose veins of unspecified lower extremity: Secondary | ICD-10-CM | POA: Diagnosis not present

## 2014-10-04 DIAGNOSIS — Z79899 Other long term (current) drug therapy: Secondary | ICD-10-CM | POA: Diagnosis not present

## 2014-10-04 LAB — URINE MICROSCOPIC-ADD ON

## 2014-10-04 LAB — COMPREHENSIVE METABOLIC PANEL
ALT: 18 U/L (ref 14–54)
AST: 20 U/L (ref 15–41)
Albumin: 4 g/dL (ref 3.5–5.0)
Alkaline Phosphatase: 44 U/L (ref 38–126)
Anion gap: 9 (ref 5–15)
BILIRUBIN TOTAL: 0.6 mg/dL (ref 0.3–1.2)
BUN: 20 mg/dL (ref 6–20)
CALCIUM: 8.9 mg/dL (ref 8.9–10.3)
CO2: 26 mmol/L (ref 22–32)
CREATININE: 1.23 mg/dL — AB (ref 0.44–1.00)
Chloride: 104 mmol/L (ref 101–111)
GFR calc Af Amer: 48 mL/min — ABNORMAL LOW (ref >60)
GFR, EST NON AFRICAN AMERICAN: 42 mL/min — AB (ref >60)
GLUCOSE: 108 mg/dL — AB (ref 65–99)
Potassium: 3.8 mmol/L (ref 3.5–5.1)
SODIUM: 139 mmol/L (ref 135–145)
Total Protein: 7.5 g/dL (ref 6.5–8.1)

## 2014-10-04 LAB — CBC
HCT: 38.4 % (ref 36.0–46.0)
HEMOGLOBIN: 12.6 g/dL (ref 12.0–15.0)
MCH: 31.4 pg (ref 26.0–34.0)
MCHC: 32.8 g/dL (ref 30.0–36.0)
MCV: 95.8 fL (ref 78.0–100.0)
Platelets: 218 10*3/uL (ref 150–400)
RBC: 4.01 MIL/uL (ref 3.87–5.11)
RDW: 14.8 % (ref 11.5–15.5)
WBC: 8.1 10*3/uL (ref 4.0–10.5)

## 2014-10-04 LAB — URINALYSIS, ROUTINE W REFLEX MICROSCOPIC
Bilirubin Urine: NEGATIVE
Glucose, UA: NEGATIVE mg/dL
Hgb urine dipstick: NEGATIVE
Ketones, ur: NEGATIVE mg/dL
Nitrite: POSITIVE — AB
Protein, ur: NEGATIVE mg/dL
SPECIFIC GRAVITY, URINE: 1.018 (ref 1.005–1.030)
UROBILINOGEN UA: 0.2 mg/dL (ref 0.0–1.0)
pH: 6 (ref 5.0–8.0)

## 2014-10-04 LAB — ABO/RH: ABO/RH(D): AB POS

## 2014-10-04 LAB — POC OCCULT BLOOD, ED: Fecal Occult Bld: POSITIVE — AB

## 2014-10-04 LAB — CLOSTRIDIUM DIFFICILE BY PCR: Toxigenic C. Difficile by PCR: NEGATIVE

## 2014-10-04 LAB — TYPE AND SCREEN
ABO/RH(D): AB POS
Antibody Screen: NEGATIVE

## 2014-10-04 LAB — LIPASE, BLOOD: Lipase: 16 U/L — ABNORMAL LOW (ref 22–51)

## 2014-10-04 MED ORDER — SODIUM CHLORIDE 0.9 % IV SOLN
INTRAVENOUS | Status: DC
  Administered 2014-10-04 – 2014-10-06 (×3): via INTRAVENOUS

## 2014-10-04 MED ORDER — METRONIDAZOLE 500 MG PO TABS
500.0000 mg | ORAL_TABLET | Freq: Three times a day (TID) | ORAL | Status: DC
  Administered 2014-10-04 – 2014-10-05 (×3): 500 mg via ORAL
  Filled 2014-10-04 (×6): qty 1

## 2014-10-04 MED ORDER — IOHEXOL 300 MG/ML  SOLN
100.0000 mL | Freq: Once | INTRAMUSCULAR | Status: AC | PRN
  Administered 2014-10-04: 80 mL via INTRAVENOUS

## 2014-10-04 MED ORDER — BUPROPION HCL ER (SR) 150 MG PO TB12
150.0000 mg | ORAL_TABLET | Freq: Every day | ORAL | Status: DC
  Administered 2014-10-05 – 2014-10-06 (×2): 150 mg via ORAL
  Filled 2014-10-04 (×2): qty 1

## 2014-10-04 MED ORDER — MORPHINE SULFATE 2 MG/ML IJ SOLN
1.0000 mg | INTRAMUSCULAR | Status: DC | PRN
  Administered 2014-10-04: 1 mg via INTRAVENOUS
  Filled 2014-10-04 (×2): qty 1

## 2014-10-04 MED ORDER — VALSARTAN-HYDROCHLOROTHIAZIDE 160-25 MG PO TABS: 1.0000 | ORAL_TABLET | Freq: Every day | ORAL | Status: DC

## 2014-10-04 MED ORDER — ADULT MULTIVITAMIN W/MINERALS CH
1.0000 | ORAL_TABLET | Freq: Every day | ORAL | Status: DC
  Administered 2014-10-05 – 2014-10-06 (×2): 1 via ORAL
  Filled 2014-10-04 (×2): qty 1

## 2014-10-04 MED ORDER — IOHEXOL 300 MG/ML  SOLN
50.0000 mL | Freq: Once | INTRAMUSCULAR | Status: AC | PRN
  Administered 2014-10-04: 50 mL via ORAL

## 2014-10-04 MED ORDER — ACETAMINOPHEN 325 MG PO TABS: 650.0000 mg | ORAL_TABLET | Freq: Four times a day (QID) | ORAL | Status: DC | PRN

## 2014-10-04 MED ORDER — ONDANSETRON HCL 4 MG/2ML IJ SOLN
4.0000 mg | Freq: Four times a day (QID) | INTRAMUSCULAR | Status: DC | PRN
  Filled 2014-10-04: qty 2

## 2014-10-04 MED ORDER — ACETAMINOPHEN 650 MG RE SUPP: 650.0000 mg | Freq: Four times a day (QID) | RECTAL | Status: DC | PRN

## 2014-10-04 MED ORDER — CIPROFLOXACIN HCL 500 MG PO TABS
500.0000 mg | ORAL_TABLET | Freq: Once | ORAL | Status: AC
  Administered 2014-10-04: 500 mg via ORAL
  Filled 2014-10-04: qty 1

## 2014-10-04 MED ORDER — IRBESARTAN 150 MG PO TABS
150.0000 mg | ORAL_TABLET | Freq: Every day | ORAL | Status: DC
  Administered 2014-10-04 – 2014-10-06 (×3): 150 mg via ORAL
  Filled 2014-10-04 (×3): qty 1

## 2014-10-04 MED ORDER — HYDROCHLOROTHIAZIDE 25 MG PO TABS
25.0000 mg | ORAL_TABLET | Freq: Every day | ORAL | Status: DC
  Administered 2014-10-04 – 2014-10-06 (×3): 25 mg via ORAL
  Filled 2014-10-04 (×3): qty 1

## 2014-10-04 NOTE — ED Notes (Signed)
Patient states she has abdominal pain and when using the restroom she noticed blood in the stool.  She is having abdominal cramps.  She denies any N/V or diarrhea.  She states she has no desire to eat.

## 2014-10-04 NOTE — Consult Note (Signed)
Referring Provider: Dr. Janece Canterbury ([facility]) Primary Care Physician:  Henrine Screws, MD Primary Gastroenterologist:  Dr. Howell Rucks  Reason for Consultation:  Colitis  HPI: Denise Cline is a 74 y.o. female admitted through the emergency room today following a roughly 12 hour prodrome of extreme low abdominal crampy pain, reminiscent of the pain when she gave birth to her children, associated with bloody diarrhea, mostly blood, little stool. The blood is becoming somewhat darker since admission, but the abdominal pain continues. A CT scan on admission showed left-sided colitis involving the descending colon. The patient has been afebrile, hemodynamically stable, normal hemoglobin. C. difficile toxin is negative.  Colonoscopy 4 years ago by Dr. Earle Gell was normal.  The patient noted a subtle change in bowel habits over the past couple of months, whereby her longstanding tendency for constipation was somewhat reversed, toward loose, more frequent stools, which she attributes to using salad with olive oil in her diet several times a week.   Past Medical History  Diagnosis Date  . History of shingles   . Hypertension   . Thyroid disease     Nodules  . Fibroid   . Anxiety   . Varicose veins   . COPD (chronic obstructive pulmonary disease)     Past Surgical History  Procedure Laterality Date  . Abdominal hysterectomy    . Oophorectomy      One ovary removed  . Thyroid nodules    . Thyroid surgery      Prior to Admission medications   Medication Sig Start Date End Date Taking? Authorizing Provider  aspirin 325 MG EC tablet Take 325 mg by mouth daily.   Yes Historical Provider, MD  buPROPion (WELLBUTRIN SR) 150 MG 12 hr tablet Take 150 mg by mouth daily.   Yes Historical Provider, MD  Multiple Vitamins-Minerals (CENTRUM SILVER ADULT 50+) TABS Take 1 tablet by mouth daily.   Yes Historical Provider, MD  valsartan-hydrochlorothiazide (DIOVAN-HCT)  160-25 MG per tablet Take 1 tablet by mouth daily.   Yes Historical Provider, MD    Current Facility-Administered Medications  Medication Dose Route Frequency Provider Last Rate Last Dose  . acetaminophen (TYLENOL) tablet 650 mg  650 mg Oral Q6H PRN Janece Canterbury, MD       Or  . acetaminophen (TYLENOL) suppository 650 mg  650 mg Rectal Q6H PRN Janece Canterbury, MD      . Derrill Memo ON 10/05/2014] buPROPion [facility] SR) 12 hr tablet 150 mg  150 mg Oral Daily Janece Canterbury, MD      . irbesartan (AVAPRO) tablet 150 mg  150 mg Oral Daily Minda Ditto, RPH   150 mg at 10/04/14 2138   And  . hydrochlorothiazide (HYDRODIURIL) tablet 25 mg  25 mg Oral Daily Minda Ditto, RPH   25 mg at 10/04/14 2138  . metroNIDAZOLE (FLAGYL) tablet 500 mg  500 mg Oral 3 times per day Janece Canterbury, MD   500 mg at 10/04/14 2138  . morphine 2 MG/ML injection 1 mg  1 mg Intravenous Q2H PRN Janece Canterbury, MD   1 mg at 10/04/14 2150  . [START ON 10/05/2014] multivitamin with minerals tablet 1 tablet  1 tablet Oral Daily Janece Canterbury, MD      . ondansetron [facility]) injection 4 mg  4 mg Intravenous Q6H PRN Janece Canterbury, MD        Allergies as of 10/04/2014 - Review Complete 10/04/2014  Allergen Reaction Noted  . Penicillins Anaphylaxis 02/18/2012  .  Sulfa antibiotics Hives 07/03/2013  . Oxycodone Other (See Comments) 04/01/2012    Family History  Problem Relation Age of Onset  . Crohn's disease Mother   . Stroke Father   . Diabetes Sister   . Hypertension Sister   . Hypertension Brother     History   Social History  . Marital Status: Divorced    Spouse Name: N/A  . Number of Children: N/A  . Years of Education: N/A   Occupational History  . Not on file.   Social History Main Topics  . Smoking status: Former Smoker -- 40 years    Types: Cigarettes    Quit date: 05/30/2008  . Smokeless tobacco: Former Systems developer    Quit date: 05/12/2008  . Alcohol Use: No     Comment: Rare  . Drug Use: No  .  Sexual Activity: No   Other Topics Concern  . Not on file   Social History Narrative    Review of Systems: Negative for anorexia or weight loss (has actually gained about 15 or 20 pounds over the past 6 months, which she attributes to leg swelling), moderate exertional dyspnea (only walks short distances) associated with COPD; no chest pain, no chronic skin rashes, no urinary difficulties, no hot or cold intolerance. She did have some transient vomiting after coming to the emergency room.  Physical Exam: Vital signs in last 24 hours: Temp:  [97.7 F (36.5 C)-98.2 F (36.8 C)] 97.7 F (36.5 C) (08/01 2057) Pulse Rate:  [59-69] 69 (08/01 2057) Resp:  [16-20] 20 (08/01 2057) BP: (120-153)/(75-89) 153/85 mmHg (08/01 2057) SpO2:  [97 %-100 %] 99 % (08/01 2057) Weight:  [97.523 kg (215 lb)] 97.523 kg (215 lb) (08/01 1748) Last BM Date: 10/04/14 General:   Alert,  Well-developed, well-nourished, pleasant and cooperative in NAD Head:  Normocephalic and atraumatic. Eyes:  Sclera clear, no icterus.   Conjunctiva pink. Mouth:   No ulcerations or lesions.  Oropharynx pink & moist. Neck:   No masses or thyromegaly. Lungs:  Clear throughout to auscultation.   No wheezes, crackles, or rhonchi. No evident respiratory distress. Vesicular breath sounds seem normal. Heart:   Regular rate and rhythm; no murmurs, clicks, rubs,  or gallops. Abdomen:  Soft, nontender, mildly tympanitic in the right upper quadrant, and nondistended. No masses, hepatosplenomegaly or ventral hernias noted. Normal bowel sounds, without bruits, guarding, or rebound.   Rectal:  Not performed, but I examined stool which she just past, in the commode. It was liquid, dark red with tissue, very little brown stool present.  Msk:   Symmetrical without gross deformities. Pulses: Radial pulse is diminished Extremities:   Without clubbing, cyanosis, or edema. Despite her stated history of recent leg swelling, I do not detect lower  extremity pitting edema at this time. Neurologic:  Alert and coherent;  grossly normal neurologically. Skin:  Intact without significant lesions or rashes. Cervical Nodes:  No significant cervical adenopathy. Psych:   Alert and cooperative. Normal mood and affect.  Intake/Output from previous day:   Intake/Output this shift: Total I/O In: 240 [P.O.:240] Out: 150 [Urine:150]  Lab Results:  Recent Labs  10/04/14 1409  WBC 8.1  HGB 12.6  HCT 38.4  PLT 218   BMET  Recent Labs  10/04/14 1409  NA 139  K 3.8  CL 104  CO2 26  GLUCOSE 108*  BUN 20  CREATININE 1.23*  CALCIUM 8.9   LFT  Recent Labs  10/04/14 1409  PROT 7.5  ALBUMIN 4.0  AST 20  ALT 18  ALKPHOS 44  BILITOT 0.6   PT/INR No results for input(s): LABPROT, INR in the last 72 hours.  Studies/Results: Ct Abdomen Pelvis W Contrast  10/04/2014   CLINICAL DATA:  Rectal bleeding, abdominal pain beginning at 0200 hours today, past history of hypertension, COPD  EXAM: CT ABDOMEN AND PELVIS WITH CONTRAST  TECHNIQUE: Multidetector CT imaging of the abdomen and pelvis was performed using the standard protocol following bolus administration of intravenous contrast. Sagittal and coronal MPR images reconstructed from axial data set.  CONTRAST:  24mL OMNIPAQUE IOHEXOL 300 MG/ML SOLN PO, 43mL OMNIPAQUE IOHEXOL 300 MG/ML SOLN IV  COMPARISON:  None  FINDINGS: Scattered atelectasis and few blebs at lung bases.  12 mm cyst upper pole LEFT kidney image 34.  Liver, gallbladder, spleen, pancreas, kidneys, and adrenal glands otherwise normal appearance.  Bowel wall thickening of the descending colon consistent with colitis.  Stomach and bowel loops otherwise normal appearance.  Unremarkable bladder and ureters.  Uterus surgically absent with unremarkable LEFT ovary.  RIGHT ovary and appendix are not visualized.  No mass, adenopathy, free air or free fluid.  Tiny umbilical hernia containing fat.  Minimal atherosclerotic calcification.   IMPRESSION: Bowel wall thickening of the descending colon consistent with colitis; differential diagnosis includes inflammatory bowel disease and infection, ischemia considered unlikely due to distribution and lack of significant vascular occlusive disease changes.  No other acute intra-abdominal or intrapelvic abnormalities identified.   Electronically Signed   By: Lavonia Dana M.D.   On: 10/04/2014 15:59    Impression: Acute or subacute onset of lower abdominal pain, bloody diarrhea, and CT evidence of colitis involving the descending colon--all of which to me seems strongly compatible with ischemic colitis, despite the absence of atherosclerotic changes or occlusive disease in her mesenteric vessels on her CT (it is felt that most ischemic colitis is due to arterial spasm, not chronic occlusion).  Plan: 1. Continue supportive care, with analgesics as needed to maintain comfort. 2. IV fluids because the patient will be nothing by mouth after midnight, and because these patients tend to have third spacing and some degree of volume contraction 3. I do not see the need for antibiotics in this setting, especially given the absence of leukocytosis or fever 4. I would favor limited colonoscopy tomorrow to examine the involved area of the colon, to try to discern the nature and severity of the inflammatory process, and to try to confirm the clinical impression of ischemic colitis. I have explained the procedure including the purpose and risks to the patient, and she is agreeable     [place] Soapstone V  10/04/2014, 10:26 PM   Pager [phone] If no answer or after 5 PM call [phone]

## 2014-10-04 NOTE — ED Notes (Signed)
MD at bedside. 

## 2014-10-04 NOTE — H&P (Signed)
[facility] History and Physical  Denise Cline SWN:[id number] DOB: 1940/12/22 DOA: 10/04/2014  Referring physician:  Malvin Johns PCP:  Henrine Screws, MD   Chief Complaint:  Rectal bleeding  HPI:  The patient is a 74 y.o. year-old female with history of COPD, hypertension, hypothyroidism, anxiety, PVD and family history of Crohn's disease in her mother who presents with abdominal pain and rectal bleeding.  The patient was last at their baseline health until about two week ago.  She states that she normally has two soft brown stools per day but that over the last two weeks, she has had more frequent stools and after her stools she would have some abdominal cramping.  She denies nausea, vomiting, fevers, chills, or blood in her stools prior to this morning. She denies black tarry stools. On the morning of admission, she awoke around 2 AM with severe abdominal cramping in the mid abdomen which she described is like labor pains. She states that the pains were coming about every 15 minutes and lasting for about a minute each time and that over the course of the night they started occurring more and more frequently. She she developed watery diarrhea and had about 3 loose stools and then developed smaller volume stools that were mucousy and bloody. The blood was in between light and dark in color and she had some clots mixed in. She denied lightheadedness, shortness of breath.  She had a colonoscopy in 2012 which was completely normal.  She denies weight loss and states she has gained 15 pounds in the last few months. She has not had any problems with iron deficiency anemia. She denies foreign travel, animal exposures, antibiotics or hospitalizations in the last 3 months.  In the emergency department, her vital signs were notable for mild hypertension 1 4481. Her hemoglobin was 12.6, and her creatinine was 1.23. Her CT of the abdomen and pelvis demonstrated bowel wall thickening of the  descending colon consistent with colitis.  The radiologist noted that ischemia is considered unlikely because of the distribution and lack of significant vascular occlusive disease changes. The patient had ongoing abdominal pain with nausea and vomiting in the emergency department. She also had several episodes of bloody rectal discharge that was small in volume.  Review of Systems:  General:  Denies fevers, chills, weight loss or gain HEENT:  Denies changes to hearing and vision, rhinorrhea, sinus congestion, sore throat CV:  Denies chest pain and palpitations, lower extremity edema.  PULM:  Denies SOB, wheezing, cough.   GI:  Per history of present illness  GU:  Denies dysuria, frequency, urgency ENDO:  Denies polyuria, polydipsia.   HEME:  Denies hematemesis, blood in stools, melena, abnormal bruising or bleeding.  LYMPH:  Denies lymphadenopathy.   MSK:  Denies arthralgias, myalgias.   DERM:  Denies skin rash or ulcer.   NEURO:  Denies focal numbness, weakness, slurred speech, confusion, facial droop.  PSYCH:  Denies anxiety and depression.    Past Medical History  Diagnosis Date  . History of shingles   . Hypertension   . Thyroid disease     Nodules  . Fibroid   . Anxiety   . Varicose veins   . COPD (chronic obstructive pulmonary disease)    Past Surgical History  Procedure Laterality Date  . Abdominal hysterectomy    . Oophorectomy      One ovary removed  . Thyroid nodules    . Thyroid surgery     Social History:  reports  that she quit smoking about 6 years ago. Her smoking use included Cigarettes. She quit after 40 years of use. She quit smokeless tobacco use about 6 years ago. She reports that she does not drink alcohol or use illicit drugs.  From home, independent of all ADLs and still drives.   Allergies  Allergen Reactions  . Penicillins Anaphylaxis  . Sulfa Antibiotics Hives  . Oxycodone Other (See Comments)    hallucanations     Family History  Problem  Relation Age of Onset  . Crohn's disease Mother   . Stroke Father   . Diabetes Sister   . Hypertension Sister   . Hypertension Brother      Prior to Admission medications   Medication Sig Start Date End Date Taking? Authorizing Provider  aspirin 325 MG EC tablet Take 325 mg by mouth daily.   Yes Historical Provider, MD  buPROPion (WELLBUTRIN SR) 150 MG 12 hr tablet Take 150 mg by mouth daily.   Yes Historical Provider, MD  Multiple Vitamins-Minerals (CENTRUM SILVER ADULT 50+) TABS Take 1 tablet by mouth daily.   Yes Historical Provider, MD  valsartan-hydrochlorothiazide (DIOVAN-HCT) 160-25 MG per tablet Take 1 tablet by mouth daily.   Yes Historical Provider, MD   Physical Exam: Filed Vitals:   10/04/14 1350 10/04/14 1700 10/04/14 1707 10/04/14 1748  BP: 120/75 144/81  123/89  Pulse: 60 68  59  Temp: 97.8 F (36.6 C)   98.2 F (36.8 C)  TempSrc: Oral   Oral  Resp: 16  16 16   Height:    5\' 6"  (1.676 m)  SpO2: 100% 97%  97%     General:  Pleasant adult female, no acute distress, vomited a couple of times during the interview  Eyes:  PERRL, anicteric, non-injected.  ENT:  Nares clear.  OP clear, non-erythematous without plaques or exudates.  MMM.  Neck:  Supple without TM or JVD.    Lymph:  No cervical, supraclavicular, or submandibular LAD.  Cardiovascular:  RRR, normal S1, S2, without m/r/g.  2+ pulses, warm extremities  Respiratory:  Diminished bilateral breath sounds without rales, wheezes, or rhonchi, without increased WOB.  Abdomen:  NABS.  Soft, mildly distended, tender to palpation in the epigastrium, left upper quadrant, just to the left of the umbilicus, and left lower quadrant  Skin:  No rashes or focal lesions.  Musculoskeletal:  Normal bulk and tone.  No LE edema.  Psychiatric:  A & O x 4.  Appropriate affect.  Neurologic:  CN 3-12 intact.  5/5 strength.  Sensation intact.  Labs on Admission:  Basic Metabolic Panel:  Recent Labs Lab 10/04/14 1409   NA 139  K 3.8  CL 104  CO2 26  GLUCOSE 108*  BUN 20  CREATININE 1.23*  CALCIUM 8.9   Liver Function Tests:  Recent Labs Lab 10/04/14 1409  AST 20  ALT 18  ALKPHOS 44  BILITOT 0.6  PROT 7.5  ALBUMIN 4.0    Recent Labs Lab 10/04/14 1409  LIPASE 16*   No results for input(s): AMMONIA in the last 168 hours. CBC:  Recent Labs Lab 10/04/14 1409  WBC 8.1  HGB 12.6  HCT 38.4  MCV 95.8  PLT 218   Cardiac Enzymes: No results for input(s): CKTOTAL, CKMB, CKMBINDEX, TROPONINI in the last 168 hours.  BNP (last 3 results) No results for input(s): BNP in the last 8760 hours.  ProBNP (last 3 results) No results for input(s): PROBNP in the last 8760 hours.  CBG:  No results for input(s): GLUCAP in the last 168 hours.  Radiological Exams on Admission: Ct Abdomen Pelvis W Contrast  10/04/2014   CLINICAL DATA:  Rectal bleeding, abdominal pain beginning at 0200 hours today, past history of hypertension, COPD  EXAM: CT ABDOMEN AND PELVIS WITH CONTRAST  TECHNIQUE: Multidetector CT imaging of the abdomen and pelvis was performed using the standard protocol following bolus administration of intravenous contrast. Sagittal and coronal MPR images reconstructed from axial data set.  CONTRAST:  26mL OMNIPAQUE IOHEXOL 300 MG/ML SOLN PO, 2mL OMNIPAQUE IOHEXOL 300 MG/ML SOLN IV  COMPARISON:  None  FINDINGS: Scattered atelectasis and few blebs at lung bases.  12 mm cyst upper pole LEFT kidney image 34.  Liver, gallbladder, spleen, pancreas, kidneys, and adrenal glands otherwise normal appearance.  Bowel wall thickening of the descending colon consistent with colitis.  Stomach and bowel loops otherwise normal appearance.  Unremarkable bladder and ureters.  Uterus surgically absent with unremarkable LEFT ovary.  RIGHT ovary and appendix are not visualized.  No mass, adenopathy, free air or free fluid.  Tiny umbilical hernia containing fat.  Minimal atherosclerotic calcification.  IMPRESSION: Bowel  wall thickening of the descending colon consistent with colitis; differential diagnosis includes inflammatory bowel disease and infection, ischemia considered unlikely due to distribution and lack of significant vascular occlusive disease changes.  No other acute intra-abdominal or intrapelvic abnormalities identified.   Electronically Signed   By: Lavonia Dana M.D.   On: 10/04/2014 15:59    EKG: Pending  Assessment/Plan Principal Problem:   Acute colitis Active Problems:   Hypertension   Anxiety   PVD (peripheral vascular disease)   COPD (chronic obstructive pulmonary disease)  ---  Acute colitis, differential diagnosis includes infectious colitis however given her family history this may represent IBD. Her last colonoscopy was 4 years ago and was completely normal and therefore malignancy may be less likely but is not totally excluded.    -  Cipro/Flagyl -  IV fluids -  Clear liquid diet -  Morphine when necessary pain -  Zofran when necessary -  GI consultation:  Followed by Sadie [facility] -  Stool culture -  Hold aspirin -  Repeat hgb in AM  Anxiety, stable, continue Wellbutrin  Hypertension, blood pressure mildly elevated -  Valsartan-HCTZ  Diet:  CLD Access:  PIV IVF:  yes Proph:  SCD  Code Status: full Family Communication: patient alone Disposition Plan: Admit to med-surg  Time spent: 60 min Ahren Pettinger [facility] Pager [phone]  If 7PM-7AM, please contact night-coverage [email or web address] Password [email or web address] 10/04/2014, 6:01 PM

## 2014-10-04 NOTE — ED Provider Notes (Addendum)
CSN: [id number]     Arrival date & time 10/04/14  1338 History   First MD Initiated Contact with Patient 10/04/14 1408     Chief Complaint  Patient presents with  . Rectal Bleeding  . Abdominal Pain     (Consider location/radiation/quality/duration/timing/severity/associated sxs/prior Treatment) HPI Comments: Patient presents with abdominal pain and rectal bleeding. She has a history of hypertension and hypothyroidism as well as COPD. She states that this morning she woke up during the night and had some abdominal crampy pain with one episode of diarrhea. She states throughout the morning and she's felt like she's had have a bowel movement but she has not any significant stool. She has noticed some blood in the toilet and blood when she wipes. She has diffuse crampy abdominal pain particularly across the lower abdomen. She's had some nausea but no vomiting. She feels very fatigued. She denies any known fevers or chills. She denies any past history of rectal bleeding other than hemorrhoids.  Patient is a 74 y.o. female presenting with hematochezia and abdominal pain.  Rectal Bleeding Associated symptoms: abdominal pain   Associated symptoms: no dizziness, no fever and no vomiting   Abdominal Pain Associated symptoms: diarrhea, hematochezia and nausea   Associated symptoms: no chest pain, no chills, no cough, no fatigue, no fever, no hematuria, no shortness of breath and no vomiting     Past Medical History  Diagnosis Date  . History of shingles   . Hypertension   . Thyroid disease     Nodules  . Fibroid   . Anxiety   . Varicose veins   . COPD (chronic obstructive pulmonary disease)    Past Surgical History  Procedure Laterality Date  . Abdominal hysterectomy    . Oophorectomy      One ovary removed  . Thyroid nodules    . Thyroid surgery     Family History  Problem Relation Age of Onset  . Crohn's disease Mother   . Stroke Father   . Diabetes Sister   . Hypertension Sister    . Hypertension Brother    History  Substance Use Topics  . Smoking status: Former Smoker -- 40 years    Types: Cigarettes    Quit date: 05/30/2008  . Smokeless tobacco: Former Systems developer    Quit date: 05/12/2008  . Alcohol Use: No     Comment: Rare   OB History    Gravida Para Term Preterm AB TAB SAB Ectopic Multiple Living   3 3 3       3      Review of Systems  Constitutional: Negative for fever, chills, diaphoresis and fatigue.  HENT: Negative for congestion, rhinorrhea and sneezing.   Eyes: Negative.   Respiratory: Negative for cough, chest tightness and shortness of breath.   Cardiovascular: Negative for chest pain and leg swelling.  Gastrointestinal: Positive for nausea, abdominal pain, diarrhea, blood in stool and hematochezia. Negative for vomiting.  Genitourinary: Negative for frequency, hematuria, flank pain and difficulty urinating.  Musculoskeletal: Negative for back pain and arthralgias.  Skin: Negative for rash.  Neurological: Negative for dizziness, speech difficulty, weakness, numbness and headaches.      Allergies  Penicillins; Sulfa antibiotics; and Oxycodone  Home Medications   Prior to Admission medications   Medication Sig Start Date End Date Taking? Authorizing Provider  aspirin 325 MG EC tablet Take 325 mg by mouth daily.   Yes Historical Provider, MD  buPROPion (WELLBUTRIN SR) 150 MG 12 hr tablet Take 150  mg by mouth daily.   Yes Historical Provider, MD  Multiple Vitamins-Minerals (CENTRUM SILVER ADULT 50+) TABS Take 1 tablet by mouth daily.   Yes Historical Provider, MD  valsartan-hydrochlorothiazide (DIOVAN-HCT) 160-25 MG per tablet Take 1 tablet by mouth daily.   Yes Historical Provider, MD   BP 120/75 mmHg  Pulse 60  Temp(Src) 97.8 F (36.6 C) (Oral)  Resp 16  SpO2 100% Physical Exam  Constitutional: She is oriented to person, place, and time. She appears well-developed and well-nourished.  HENT:  Head: Normocephalic and atraumatic.  Eyes:  Pupils are equal, round, and reactive to light.  Neck: Normal range of motion. Neck supple.  Cardiovascular: Normal rate, regular rhythm and normal heart sounds.   Pulmonary/Chest: Effort normal and breath sounds normal. No respiratory distress. She has no wheezes. She has no rales. She exhibits no tenderness.  Abdominal: Soft. Bowel sounds are normal. There is tenderness (Mild diffuse tenderness). There is no rebound and no guarding.  Genitourinary:  Small amount of gross blood on rectal exam. No visible signs of bleeding. She does have some large nonthrombosed nontender hemorrhoids.  Musculoskeletal: Normal range of motion. She exhibits no edema.  Lymphadenopathy:    She has no cervical adenopathy.  Neurological: She is alert and oriented to person, place, and time.  Skin: Skin is warm and dry. No rash noted.  Psychiatric: She has a normal mood and affect.    ED Course  Procedures (including critical care time) Labs Review Labs Reviewed  COMPREHENSIVE METABOLIC PANEL - Abnormal; Notable for the following:    Glucose, Bld 108 (*)    Creatinine, Ser 1.23 (*)    GFR calc non Af Amer 42 (*)    GFR calc Af Amer 48 (*)    All other components within normal limits  URINALYSIS, ROUTINE W REFLEX MICROSCOPIC (NOT AT [facility]) - Abnormal; Notable for the following:    APPearance CLOUDY (*)    Nitrite POSITIVE (*)    Leukocytes, UA MODERATE (*)    All other components within normal limits  LIPASE, BLOOD - Abnormal; Notable for the following:    Lipase 16 (*)    All other components within normal limits  URINE MICROSCOPIC-ADD ON - Abnormal; Notable for the following:    Bacteria, UA MANY (*)    All other components within normal limits  POC OCCULT BLOOD, ED - Abnormal; Notable for the following:    Fecal Occult Bld POSITIVE (*)    All other components within normal limits  CBC  TYPE AND SCREEN  ABO/RH    Imaging Review Ct Abdomen Pelvis W Contrast  10/04/2014   CLINICAL DATA:  Rectal  bleeding, abdominal pain beginning at 0200 hours today, past history of hypertension, COPD  EXAM: CT ABDOMEN AND PELVIS WITH CONTRAST  TECHNIQUE: Multidetector CT imaging of the abdomen and pelvis was performed using the standard protocol following bolus administration of intravenous contrast. Sagittal and coronal MPR images reconstructed from axial data set.  CONTRAST:  28mL OMNIPAQUE IOHEXOL 300 MG/ML SOLN PO, 59mL OMNIPAQUE IOHEXOL 300 MG/ML SOLN IV  COMPARISON:  None  FINDINGS: Scattered atelectasis and few blebs at lung bases.  12 mm cyst upper pole LEFT kidney image 34.  Liver, gallbladder, spleen, pancreas, kidneys, and adrenal glands otherwise normal appearance.  Bowel wall thickening of the descending colon consistent with colitis.  Stomach and bowel loops otherwise normal appearance.  Unremarkable bladder and ureters.  Uterus surgically absent with unremarkable LEFT ovary.  RIGHT ovary and appendix  are not visualized.  No mass, adenopathy, free air or free fluid.  Tiny umbilical hernia containing fat.  Minimal atherosclerotic calcification.  IMPRESSION: Bowel wall thickening of the descending colon consistent with colitis; differential diagnosis includes inflammatory bowel disease and infection, ischemia considered unlikely due to distribution and lack of significant vascular occlusive disease changes.  No other acute intra-abdominal or intrapelvic abnormalities identified.   Electronically Signed   By: Lavonia Dana M.D.   On: 10/04/2014 15:59     EKG Interpretation None      MDM   Final diagnoses:  Rectal bleeding  UTI (lower urinary tract infection)  Colitis    Patient presents with right red blood per rectum. She does have gross blood on rectal exam. Her CT scan shows evidence of colitis. It's felt to be unlikely secondary to ischemia. Her hemoglobin is stable. Her vital signs are stable. However given the gross blood, I feel that she should be admitted to make sure that the hemoglobin  remains stable.  Discussed with Dr. Sheran Fava who will put in admission orders.    Malvin Johns, MD 10/04/14 [place], MD 10/04/14 [phone]

## 2014-10-05 ENCOUNTER — Encounter (HOSPITAL_COMMUNITY)
Admission: EM | Disposition: A | Discharge status: Home / Self Care | Payer: Self-pay | Source: Home / Self Care | Attending: Emergency Medicine

## 2014-10-05 ENCOUNTER — Observation Stay (HOSPITAL_COMMUNITY): Payer: Medicare Other | Admitting: Certified Registered Nurse Anesthetist

## 2014-10-05 ENCOUNTER — Encounter (HOSPITAL_COMMUNITY): Payer: Self-pay | Admitting: Certified Registered Nurse Anesthetist

## 2014-10-05 DIAGNOSIS — K625 Hemorrhage of anus and rectum: Secondary | ICD-10-CM | POA: Diagnosis not present

## 2014-10-05 DIAGNOSIS — K529 Noninfective gastroenteritis and colitis, unspecified: Secondary | ICD-10-CM | POA: Diagnosis not present

## 2014-10-05 DIAGNOSIS — I10 Essential (primary) hypertension: Secondary | ICD-10-CM | POA: Diagnosis not present

## 2014-10-05 DIAGNOSIS — N390 Urinary tract infection, site not specified: Secondary | ICD-10-CM | POA: Diagnosis not present

## 2014-10-05 DIAGNOSIS — K550 Acute vascular disorders of intestine: Secondary | ICD-10-CM | POA: Diagnosis not present

## 2014-10-05 HISTORY — PX: COLONOSCOPY: SHX5424

## 2014-10-05 LAB — BASIC METABOLIC PANEL
Anion gap: 8 (ref 5–15)
BUN: 15 mg/dL (ref 6–20)
CO2: 27 mmol/L (ref 22–32)
CREATININE: 1.02 mg/dL — AB (ref 0.44–1.00)
Calcium: 8.5 mg/dL — ABNORMAL LOW (ref 8.9–10.3)
Chloride: 103 mmol/L (ref 101–111)
GFR calc non Af Amer: 52 mL/min — ABNORMAL LOW (ref >60)
GLUCOSE: 124 mg/dL — AB (ref 65–99)
POTASSIUM: 3.6 mmol/L (ref 3.5–5.1)
Sodium: 138 mmol/L (ref 135–145)

## 2014-10-05 LAB — CBC
HEMATOCRIT: 35.1 % — AB (ref 36.0–46.0)
Hemoglobin: 11.9 g/dL — ABNORMAL LOW (ref 12.0–15.0)
MCH: 32.4 pg (ref 26.0–34.0)
MCHC: 33.9 g/dL (ref 30.0–36.0)
MCV: 95.6 fL (ref 78.0–100.0)
Platelets: 207 10*3/uL (ref 150–400)
RBC: 3.67 MIL/uL — AB (ref 3.87–5.11)
RDW: 14.9 % (ref 11.5–15.5)
WBC: 8.1 10*3/uL (ref 4.0–10.5)

## 2014-10-05 LAB — PROTIME-INR
INR: 1.09 (ref 0.00–1.49)
Prothrombin Time: 14.3 seconds (ref 11.6–15.2)

## 2014-10-05 LAB — APTT: aPTT: 27 seconds (ref 24–37)

## 2014-10-05 SURGERY — COLONOSCOPY
Anesthesia: Monitor Anesthesia Care

## 2014-10-05 MED ORDER — PROPOFOL 10 MG/ML IV BOLUS
INTRAVENOUS | Status: AC
  Filled 2014-10-05: qty 20

## 2014-10-05 MED ORDER — SODIUM CHLORIDE 0.9 % IV SOLN: INTRAVENOUS | Status: DC

## 2014-10-05 MED ORDER — LIDOCAINE HCL (CARDIAC) 20 MG/ML IV SOLN
INTRAVENOUS | Status: DC | PRN
  Administered 2014-10-05: 50 mg via INTRAVENOUS

## 2014-10-05 MED ORDER — LACTATED RINGERS IV SOLN
INTRAVENOUS | Status: DC
  Administered 2014-10-05: 1000 mL via INTRAVENOUS

## 2014-10-05 MED ORDER — PROPOFOL INFUSION 10 MG/ML OPTIME
INTRAVENOUS | Status: DC | PRN
  Administered 2014-10-05: 300 ug/kg/min via INTRAVENOUS

## 2014-10-05 MED ORDER — PROPOFOL 10 MG/ML IV BOLUS
INTRAVENOUS | Status: DC | PRN
  Administered 2014-10-05: 50 mg via INTRAVENOUS

## 2014-10-05 MED ORDER — LIDOCAINE HCL (CARDIAC) 20 MG/ML IV SOLN
INTRAVENOUS | Status: AC
  Filled 2014-10-05: qty 5

## 2014-10-05 NOTE — Progress Notes (Signed)
Abdominal pain much better this morning. Abdomen is nontender to exam, and patient appears happy and content.  Hemoglobin slightly lower, creatinine is slightly improved, consistent with overnight hydration.  For unprepped colonoscopy later today.  Cleotis Nipper, M.D. Pager [phone] If no answer or after 5 PM call [phone]

## 2014-10-05 NOTE — Progress Notes (Signed)
Partial colonoscopy performed without difficulty, showing left-sided ischemic colitis of moderate severity.  Biopsies are pending.  I have started the patient on a clear liquid diet; she can probably be advanced to a low-residue diet tomorrow morning if she tolerates clear liquids.  Discharge tomorrow is probably going to be possible, as long as she has a satisfactory level of pain control, if necessary with the help of some oral analgesics.  Cleotis Nipper, M.D. Pager [phone] If no answer or after 5 PM call [phone]

## 2014-10-05 NOTE — Transfer of Care (Signed)
Immediate Anesthesia Transfer of Care Note  Patient: Denise Cline  Procedure(s) Performed: Procedure(s): COLONOSCOPY (N/A)  Patient Location: Endo Recovery  Anesthesia Type:MAC  Level of Consciousness: Patient easily awoken, sedated, comfortable, cooperative, following commands, responds to stimulation.   Airway & Oxygen Therapy: Patient spontaneously breathing, ventilating well, oxygen via simple oxygen mask.  Post-op Assessment: Report given to PACU RN, vital signs reviewed and stable, moving all extremities.   Post vital signs: Reviewed and stable.  Complications: No apparent anesthesia complications

## 2014-10-05 NOTE — Progress Notes (Signed)
[facility] PROGRESS NOTE  JANANN BOEVE CWC:[id number] DOB: 01-30-1941 DOA: 10/04/2014 PCP: Henrine Screws, MD Brief narrative 74 year old female with COPD, hypertension, hypothyroidism, anxiety and peripheral vascular disease with family history of Crohn's presented with abdominal pain and rectal bleed along with watery diarrhea on the day of admission. CT scan of the abdomen and pelvis on admission showing bowel wall thickening of the descending colon consistent with colitis. Patient admitted to hospitalist service and equal GI consulted.   Assessment/Plan: Acute colitis Differential includes ischemic colitis vs infection versus inflammatory bowel. Last colonoscopy about 4 years back was normal. On IV hydration and empiric Cipro and Flagyl. I will discontinue her antibiotics given absence of fever or leukocytosis and clinical improvement. Continue when necessary morphine for pain and Zofran when necessary for nausea. Stool for C. difficile negative. Follow stool culture. Abdominal pain and diarrhea has improved. (Had episodes of loose bowel movement With blood overnight). Slight drop in H&H this morning. -Appreciate GI evaluation. Plan on limited colonoscopy today.  Essential hypertension Blood pressure currently stable. Continue valsartan-HCTZ  Anxiety Continue Wellbutrin.  Diet: Nothing by mouth DVT prophylaxis: SCDs   Code Status: Full code Family Communication: None at bedside Disposition Plan: Home possibly in the next 24-48 hours, if improved   Consultants:  [facility] (Dr. Cristina Gong)  Procedures:  For colonoscopy on 8/2  Antibiotics:  IV Cipro and Flagyl : 8/1-8/2  HPI/Subjective: Patient still having blood mixed loose bowel movement but much better since admission. Abdominal pain better today.  Objective: Filed Vitals:   10/05/14 0534  BP: 104/61  Pulse: 61  Temp: 98.3 F (36.8 C)  Resp: 18    Intake/Output Summary (Last 24 hours) at  10/05/14 0923 Last data filed at 10/05/14 0600  Gross per 24 hour  Intake    915 ml  Output    700 ml  Net    215 ml   Filed Weights   10/04/14 1748 10/05/14 0534  Weight: 97.523 kg (215 lb) 95.255 kg (210 lb)    Exam:   General:  Elderly female in no acute distress  HEENT: No pallor, moist oral mucosa  Chest: Clear to auscultation bilaterally on CVS: Normal S1 and S2, no murmurs  GI: Soft, mild abdominal distention minimal infraumbilical and left lower quadrant tenderness, bowel sounds present  Musculoskeletal: Warm, no edema   Data Reviewed: Basic Metabolic Panel:  Recent Labs Lab 10/04/14 1409 10/05/14 0520  NA 139 138  K 3.8 3.6  CL 104 103  CO2 26 27  GLUCOSE 108* 124*  BUN 20 15  CREATININE 1.23* 1.02*  CALCIUM 8.9 8.5*   Liver Function Tests:  Recent Labs Lab 10/04/14 1409  AST 20  ALT 18  ALKPHOS 44  BILITOT 0.6  PROT 7.5  ALBUMIN 4.0    Recent Labs Lab 10/04/14 1409  LIPASE 16*   No results for input(s): AMMONIA in the last 168 hours. CBC:  Recent Labs Lab 10/04/14 1409 10/05/14 0520  WBC 8.1 8.1  HGB 12.6 11.9*  HCT 38.4 35.1*  MCV 95.8 95.6  PLT 218 207   Cardiac Enzymes: No results for input(s): CKTOTAL, CKMB, CKMBINDEX, TROPONINI in the last 168 hours. BNP (last 3 results) No results for input(s): BNP in the last 8760 hours.  ProBNP (last 3 results) No results for input(s): PROBNP in the last 8760 hours.  CBG: No results for input(s): GLUCAP in the last 168 hours.  Recent Results (from the past 240 hour(s))  Clostridium Difficile by  PCR (not at [facility])     Status: None   Collection Time: 10/04/14  7:55 PM  Result Value Ref Range Status   Toxigenic C Difficile by pcr NEGATIVE NEGATIVE Final     Studies: Ct Abdomen Pelvis W Contrast  10/04/2014   CLINICAL DATA:  Rectal bleeding, abdominal pain beginning at 0200 hours today, past history of hypertension, COPD  EXAM: CT ABDOMEN AND PELVIS WITH CONTRAST  TECHNIQUE:  Multidetector CT imaging of the abdomen and pelvis was performed using the standard protocol following bolus administration of intravenous contrast. Sagittal and coronal MPR images reconstructed from axial data set.  CONTRAST:  11mL OMNIPAQUE IOHEXOL 300 MG/ML SOLN PO, 85mL OMNIPAQUE IOHEXOL 300 MG/ML SOLN IV  COMPARISON:  None  FINDINGS: Scattered atelectasis and few blebs at lung bases.  12 mm cyst upper pole LEFT kidney image 34.  Liver, gallbladder, spleen, pancreas, kidneys, and adrenal glands otherwise normal appearance.  Bowel wall thickening of the descending colon consistent with colitis.  Stomach and bowel loops otherwise normal appearance.  Unremarkable bladder and ureters.  Uterus surgically absent with unremarkable LEFT ovary.  RIGHT ovary and appendix are not visualized.  No mass, adenopathy, free air or free fluid.  Tiny umbilical hernia containing fat.  Minimal atherosclerotic calcification.  IMPRESSION: Bowel wall thickening of the descending colon consistent with colitis; differential diagnosis includes inflammatory bowel disease and infection, ischemia considered unlikely due to distribution and lack of significant vascular occlusive disease changes.  No other acute intra-abdominal or intrapelvic abnormalities identified.   Electronically Signed   By: Lavonia Dana M.D.   On: 10/04/2014 15:59    Scheduled Meds: . buPROPion  150 mg Oral Daily  . irbesartan  150 mg Oral Daily   And  . hydrochlorothiazide  25 mg Oral Daily  . metroNIDAZOLE  500 mg Oral 3 times per day  . multivitamin with minerals  1 tablet Oral Daily   Continuous Infusions: . sodium chloride 100 mL/hr at 10/04/14 2315      Time spent: 25 minutes    Danen Lapaglia, [place]  [facility] Pager [phone]. If 7PM-7AM, please contact night-coverage at [email or web address], password [facility] 10/05/2014, 9:23 AM

## 2014-10-05 NOTE — Op Note (Signed)
[facility] [place], [address]   COLONOSCOPY PROCEDURE REPORT  PATIENT: Denise Cline, Denise Cline  MR#: [id number] BIRTHDATE: 1941/01/22 , 74  yrs. old GENDER: female ENDOSCOPIST: Ronald Lobo, MD REFERRED BY:  Dr. Ria Bush PROCEDURE DATE:  2014-10-06 PROCEDURE:   colonoscopy (incomplete) ASA CLASS:   per anesthesia INDICATIONS:left-sided colitis with abrupt onset of abdominal pain and bloody diarrhea MEDICATIONS: propofol, per anesthesia  DESCRIPTION OF PROCEDURE:   After the risks and benefits and of the procedure were explained, informed consent was obtained. The patient was brought from her hospital room to the [facility] long endoscopy unit.  Perianal exam showed skin tags. Digital exam was unremarkable. The EC-[id number] ([id number])  Pentax video colonoscope was introduced through the anus and advanced to 38 cm      . At that point, there was significant colitis and it was felt that further advancement of the scope would expose the patient to increased risk without adding much diagnostically, so pullback was then initiated.  The quality of the prep was very good, even though no specific prep was administered.      .  There was no free blood in the colonic lumen at the time this exam. There was some mucinous liquid blood that was semi-adherent to the colonic mucosa.  The mucosa of the rectum and distal sigmoid was normal, but beginning at approximately 35 cm from the anal verge, there were classic changes of moderately severe ischemic colitis, characterized by complete loss of vascularity, marked bowel wall edema, and significant exudate. As expected, these changes began initially, distally, as linear findings which then became circumferential as I advanced farther. The proximal extent of the inflammation was not determined, because I felt that there was too much inflammation to safely keep advancing the scope. There was no evidence of  gangrene in the region of inflamed mucosa that I was able to inspect.  Numerous biopsies were obtained from the inflamed area.  The instrument was then slowly withdrawn as the colon was fully examined. Estimated blood loss is zero unless otherwise noted in this procedure report.   No polyps, masses or diverticulosis were noted on this exam. Retroflexion in the rectum appeared normal.          The scope was then withdrawn from the patient and the procedure completed.  WITHDRAWAL TIME: not obtained, not pertinent for this incomplete exam  COMPLICATIONS: There were no immediate complications. ENDOSCOPIC IMPRESSION: 1. 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 Otherwise normal exam   RECOMMENDATIONS: 1. Await pathology results  2. Continue supportive care with IV fluids and analgesics. Will start patient on a clear liquid diet today and possibly advance tomorrow if she continues to feel improved.  REPEAT EXAM: not needed  cc:  _______________________________ eSignedRonald Lobo, MD 10/06/2014 1:26 PM   CPT CODES: ICD CODES:  The ICD and CPT codes recommended by this software are interpretations from the data that the clinical staff has captured with the software.  The verification of the translation of this report to the ICD and CPT codes and modifiers is the sole responsibility of the health care institution and practicing physician where this report was generated.  [facility]. will not be held responsible for the validity of the ICD and CPT codes included on this report.  AMA assumes no liability for data contained or not contained herein. CPT is a Designer, television/film set of the [employer].   PATIENT  NAME:  Denise Cline MR#: [id number]

## 2014-10-05 NOTE — Anesthesia Postprocedure Evaluation (Signed)
  Anesthesia Post-op Note  Patient: Denise Cline  Procedure(s) Performed: Procedure(s) (LRB): COLONOSCOPY (N/A)  Patient Location: PACU  Anesthesia Type: MAC  Level of Consciousness: awake and alert   Airway and Oxygen Therapy: Patient Spontanous Breathing  Post-op Pain: mild  Post-op Assessment: Post-op Vital signs reviewed, Patient's Cardiovascular Status Stable, Respiratory Function Stable, Patent Airway and No signs of Nausea or vomiting  Last Vitals:  Filed Vitals:   10/05/14 1350  BP: 116/66  Pulse: 64  Temp:   Resp: 17    Post-op Vital Signs: stable   Complications: No apparent anesthesia complications

## 2014-10-05 NOTE — Anesthesia Preprocedure Evaluation (Signed)
Anesthesia Evaluation  Patient identified by MRN, date of birth, ID band Patient awake    Reviewed: Allergy & Precautions, NPO status , Patient's Chart, lab work & pertinent test results  Airway Mallampati: II  TM Distance: >3 FB Neck ROM: Full    Dental no notable dental hx.    Pulmonary COPDformer smoker,  breath sounds clear to auscultation  Pulmonary exam normal       Cardiovascular Exercise Tolerance: Good hypertension, Pt. on medications + Peripheral Vascular Disease Normal cardiovascular examRhythm:Regular Rate:Normal     Neuro/Psych Anxiety negative neurological ROS     GI/Hepatic negative GI ROS, Neg liver ROS,   Endo/Other  negative endocrine ROS  Renal/GU negative Renal ROS  negative genitourinary   Musculoskeletal negative musculoskeletal ROS (+)   Abdominal   Peds negative pediatric ROS (+)  Hematology negative hematology ROS (+)   Anesthesia Other Findings   Reproductive/Obstetrics negative OB ROS                             Anesthesia Physical Anesthesia Plan  ASA: II  Anesthesia Plan: MAC   Post-op Pain Management:    Induction: Intravenous  Airway Management Planned: Natural Airway  Additional Equipment:   Intra-op Plan:   Post-operative Plan:   Informed Consent: I have reviewed the patients History and Physical, chart, labs and discussed the procedure including the risks, benefits and alternatives for the proposed anesthesia with the patient or authorized representative who has indicated his/her understanding and acceptance.   Dental advisory given  Plan Discussed with: CRNA  Anesthesia Plan Comments:         Anesthesia Quick Evaluation

## 2014-10-06 ENCOUNTER — Encounter (HOSPITAL_COMMUNITY): Payer: Self-pay | Admitting: Gastroenterology

## 2014-10-06 DIAGNOSIS — K529 Noninfective gastroenteritis and colitis, unspecified: Secondary | ICD-10-CM | POA: Diagnosis not present

## 2014-10-06 LAB — CBC
HCT: 35.7 % — ABNORMAL LOW (ref 36.0–46.0)
Hemoglobin: 11.5 g/dL — ABNORMAL LOW (ref 12.0–15.0)
MCH: 31.1 pg (ref 26.0–34.0)
MCHC: 32.2 g/dL (ref 30.0–36.0)
MCV: 96.5 fL (ref 78.0–100.0)
Platelets: 212 10*3/uL (ref 150–400)
RBC: 3.7 MIL/uL — AB (ref 3.87–5.11)
RDW: 14.9 % (ref 11.5–15.5)
WBC: 8.5 10*3/uL (ref 4.0–10.5)

## 2014-10-06 MED ORDER — CIPROFLOXACIN HCL 500 MG PO TABS: 500.0000 mg | ORAL_TABLET | Freq: Two times a day (BID) | ORAL | Status: AC

## 2014-10-06 NOTE — Discharge Instructions (Signed)
Follow with Primary MD GATES,ROBERT NEVILL, MD in 7 days   Get CBC, CMP,  by Primary MD next visit.    Activity: As tolerated with Full fall precautions use walker/cane & assistance as needed   Disposition Home    Diet: Heart Healthy  , with feeding assistance and aspiration precautions.  For Heart failure patients - Check your Weight same time everyday, if you gain over 2 pounds, or you develop in leg swelling, experience more shortness of breath or chest pain, call your Primary MD immediately. Follow Cardiac Low Salt Diet and 1.5 lit/day fluid restriction.   On your next visit with your primary care physician please Get Medicines reviewed and adjusted.   Please request your Prim.MD to go over all Hospital Tests and Procedure/Radiological results at the follow up, please get all Hospital records sent to your Prim MD by signing hospital release before you go home.   If you experience worsening of your admission symptoms, develop shortness of breath, life threatening emergency, suicidal or homicidal thoughts you must seek medical attention immediately by calling 911 or calling your MD immediately  if symptoms less severe.  You Must read complete instructions/literature along with all the possible adverse reactions/side effects for all the Medicines you take and that have been prescribed to you. Take any new Medicines after you have completely understood and accpet all the possible adverse reactions/side effects.   Do not drive, operating heavy machinery, perform activities at heights, swimming or participation in water activities or provide baby sitting services if your were admitted for syncope or siezures until you have seen by Primary MD or a Neurologist and advised to do so again.  Do not drive when taking Pain medications.    Do not take more than prescribed Pain, Sleep and Anxiety Medications  Special Instructions: If you have smoked or chewed Tobacco  in the last 2 yrs please  stop smoking, stop any regular Alcohol  and or any Recreational drug use.  Wear Seat belts while driving.   Please note  You were cared for by a hospitalist during your hospital stay. If you have any questions about your discharge medications or the care you received while you were in the hospital after you are discharged, you can call the unit and asked to speak with the hospitalist on call if the hospitalist that took care of you is not available. Once you are discharged, your primary care physician will handle any further medical issues. Please note that NO REFILLS for any discharge medications will be authorized once you are discharged, as it is imperative that you return to your primary care physician (or establish a relationship with a primary care physician if you do not have one) for your aftercare needs so that they can reassess your need for medications and monitor your lab values.

## 2014-10-06 NOTE — Discharge Summary (Signed)
Denise Cline, is a 74 y.o. female  DOB June 23, 1940  MRN [id number].  Admission date:  10/04/2014  Admitting Physician  Janece Canterbury, MD  Discharge Date:  10/06/2014   Primary MD  Henrine Screws, MD  Recommendations for primary care physician for things to follow:  - Patient to follow with her primary GI Dr. Earle Gell. - Her aspirin has been held on discharge and her presentation with GI bleed, can be resumed during next visit by PCP if her hemoglobin remained stable and no evidence of recurrence of GI bleed.  Admission Diagnosis  Colitis [K52.9] Rectal bleeding [K62.5] UTI (lower urinary tract infection) [N39.0]   Discharge Diagnosis  Colitis [K52.9] Rectal bleeding [K62.5] UTI (lower urinary tract infection) [N39.0]    Principal Problem:   Acute colitis Active Problems:   Hypertension   Anxiety   PVD (peripheral vascular disease)   COPD (chronic obstructive pulmonary disease)      Past Medical History  Diagnosis Date  . History of shingles   . Hypertension   . Thyroid disease     Nodules  . Fibroid   . Anxiety   . Varicose veins   . COPD (chronic obstructive pulmonary disease)     Past Surgical History  Procedure Laterality Date  . Abdominal hysterectomy    . Oophorectomy      One ovary removed  . Thyroid nodules    . Thyroid surgery    . Colonoscopy N/A 10/05/2014    Procedure: COLONOSCOPY;  Surgeon: Ronald Lobo, MD;  Location: [facility];  Service: Endoscopy;  Laterality: N/A;       History of present illness and  Hospital Course:     Kindly see H&P for history of present illness and admission details, please review complete Labs, Consult reports and Test reports for all details in brief  HPI  from the history and physical done on the day of admission on 8/1 by Dr Noah Delaine.  The patient is a 74 y.o. year-old female with history of COPD, hypertension,  hypothyroidism, anxiety, PVD and family history of Crohn's disease in her mother who presents with abdominal pain and rectal bleeding. The patient was last at their baseline health until about two week ago. She states that she normally has two soft brown stools per day but that over the last two weeks, she has had more frequent stools and after her stools she would have some abdominal cramping. She denies nausea, vomiting, fevers, chills, or blood in her stools prior to this morning. She denies black tarry stools. On the morning of admission, she awoke around 2 AM with severe abdominal cramping in the mid abdomen which she described is like labor pains. She states that the pains were coming about every 15 minutes and lasting for about a minute each time and that over the course of the night they started occurring more and more frequently. She she developed watery diarrhea and had about 3 loose stools and then developed smaller volume stools that were mucousy and bloody.  The blood was in between light and dark in color and she had some clots mixed in. She denied lightheadedness, shortness of breath. She had a colonoscopy in 2012 which was completely normal. She denies weight loss and states she has gained 15 pounds in the last few months. She has not had any problems with iron deficiency anemia. She denies foreign travel, animal exposures, antibiotics or hospitalizations in the last 3 months.  In the emergency department, her vital signs were notable for mild hypertension 1 4481. Her hemoglobin was 12.6, and her creatinine was 1.23. Her CT of the abdomen and pelvis demonstrated bowel wall thickening of the descending colon consistent with colitis. The radiologist noted that ischemia is considered unlikely because of the distribution and lack of significant vascular occlusive disease changes. The patient had ongoing abdominal pain with nausea and vomiting in the emergency department. She also had several  episodes of bloody rectal discharge that was small in volume.  Hospital Course   Acute moderate ischemic colitis - Patient was seen by GI service Dr. Harlene Ramus - Limited colonoscopy in 10/05/14 significant for moderately severe left-sided ischemic colitis - Started on clear liquid diet, advance to soft diet which she tolerated very well, no nausea, vomiting, diarrhea or abdominal pain, no recurrence of lower GI bleed, and mildly but has been stable during hospital stay, 12.6 on admission, 11.5 on discharge, aspirin has been held on discharge, to be resumed by PCP when appropriate. - Biopsy significant for him a colitis, no dysplasia or malignancy  Essential hypertension Blood pressure currently stable. Continue valsartan-HCTZ  Anxiety Continue Wellbutrin.  UTI - patient UA was positive on admission, will discharge on po Cipro X 3 days.  Discharge Condition:  Stable   Follow UP  Follow-up Information    Follow up with GATES,ROBERT NEVILL, MD. Call in 1 week.   Specialty:  Internal Medicine   Why:  post hospitalization follow-up   Contact information:   [address] [place] [address] [phone]       Call Garlan Fair, MD.   Specialty:  Gastroenterology   Why:  You will be called by his office for an appointment   Contact information:   [address] [place] [address] [phone]         Discharge Instructions  and  Discharge Medications         Discharge Instructions    Diet - low sodium heart healthy    Complete by:  As directed      Discharge instructions    Complete by:  As directed   Follow with Primary MD GATES,ROBERT NEVILL, MD in 7 days  Continue to hold aspirin until you are seen by your PCP or GI physician and cleared to resume. Get CBC, CMP, checked  by Primary MD next visit.    Activity: As tolerated with Full fall precautions use walker/cane & assistance as needed   Disposition Home **   Diet:  Heart Healthy ** , with feeding assistance and aspiration precautions.  For Heart failure patients - Check your Weight same time everyday, if you gain over 2 pounds, or you develop in leg swelling, experience more shortness of breath or chest pain, call your Primary MD immediately. Follow Cardiac Low Salt Diet and 1.5 lit/day fluid restriction.   On your next visit with your primary care physician please Get Medicines reviewed and adjusted.   Please request your Prim.MD to go over all Hospital Tests and  Procedure/Radiological results at the follow up, please get all Hospital records sent to your Prim MD by signing hospital release before you go home.   If you experience worsening of your admission symptoms, develop shortness of breath, life threatening emergency, suicidal or homicidal thoughts you must seek medical attention immediately by calling 911 or calling your MD immediately  if symptoms less severe.  You Must read complete instructions/literature along with all the possible adverse reactions/side effects for all the Medicines you take and that have been prescribed to you. Take any new Medicines after you have completely understood and accpet all the possible adverse reactions/side effects.   Do not drive, operating heavy machinery, perform activities at heights, swimming or participation in water activities or provide baby sitting services if your were admitted for syncope or siezures until you have seen by Primary MD or a Neurologist and advised to do so again.  Do not drive when taking Pain medications.    Do not take more than prescribed Pain, Sleep and Anxiety Medications  Special Instructions: If you have smoked or chewed Tobacco  in the last 2 yrs please stop smoking, stop any regular Alcohol  and or any Recreational drug use.  Wear Seat belts while driving.   Please note  You were cared for by a hospitalist during your hospital stay. If you have any questions about your  discharge medications or the care you received while you were in the hospital after you are discharged, you can call the unit and asked to speak with the hospitalist on call if the hospitalist that took care of you is not available. Once you are discharged, your primary care physician will handle any further medical issues. Please note that NO REFILLS for any discharge medications will be authorized once you are discharged, as it is imperative that you return to your primary care physician (or establish a relationship with a primary care physician if you do not have one) for your aftercare needs so that they can reassess your need for medications and monitor your lab values.     Increase activity slowly    Complete by:  As directed             Medication List    STOP taking these medications        aspirin 325 MG EC tablet      TAKE these medications        buPROPion 150 MG 12 hr tablet  Commonly known as:  WELLBUTRIN SR  Take 150 mg by mouth daily.     CENTRUM SILVER ADULT 50+ Tabs  Take 1 tablet by mouth daily.     ciprofloxacin 500 MG tablet  Commonly known as:  CIPRO  Take 1 tablet (500 mg total) by mouth 2 (two) times daily.     valsartan-hydrochlorothiazide 160-25 MG per tablet  Commonly known as:  DIOVAN-HCT  Take 1 tablet by mouth daily.          Diet and Activity recommendation: See Discharge Instructions above   Consults obtained -  GI   Major procedures and Radiology Reports - PLEASE review detailed and final reports for all details, in brief -   Partial colonoscopy performed 8/2 , showing left-sided ischemic colitis of moderate severity  Ct Abdomen Pelvis W Contrast  10/04/2014   CLINICAL DATA:  Rectal bleeding, abdominal pain beginning at 0200 hours today, past history of hypertension, COPD  EXAM: CT ABDOMEN AND PELVIS WITH CONTRAST  TECHNIQUE: Multidetector CT imaging  of the abdomen and pelvis was performed using the standard protocol following bolus  administration of intravenous contrast. Sagittal and coronal MPR images reconstructed from axial data set.  CONTRAST:  39mL OMNIPAQUE IOHEXOL 300 MG/ML SOLN PO, 64mL OMNIPAQUE IOHEXOL 300 MG/ML SOLN IV  COMPARISON:  None  FINDINGS: Scattered atelectasis and few blebs at lung bases.  12 mm cyst upper pole LEFT kidney image 34.  Liver, gallbladder, spleen, pancreas, kidneys, and adrenal glands otherwise normal appearance.  Bowel wall thickening of the descending colon consistent with colitis.  Stomach and bowel loops otherwise normal appearance.  Unremarkable bladder and ureters.  Uterus surgically absent with unremarkable LEFT ovary.  RIGHT ovary and appendix are not visualized.  No mass, adenopathy, free air or free fluid.  Tiny umbilical hernia containing fat.  Minimal atherosclerotic calcification.  IMPRESSION: Bowel wall thickening of the descending colon consistent with colitis; differential diagnosis includes inflammatory bowel disease and infection, ischemia considered unlikely due to distribution and lack of significant vascular occlusive disease changes.  No other acute intra-abdominal or intrapelvic abnormalities identified.   Electronically Signed   By: Lavonia Dana M.D.   On: 10/04/2014 15:59    Micro Results    Recent Results (from the past 240 hour(s))  Stool culture     Status: None (Preliminary result)   Collection Time: 10/04/14  7:55 PM  Result Value Ref Range Status   Specimen Description STOOL  Final   Special Requests Normal  Final   Culture   Final    NO SUSPICIOUS COLONIES, CONTINUING TO HOLD Note: REDUCED NORMAL FLORA PRESENT Performed at [facility]    Report Status PENDING  Incomplete  Clostridium Difficile by PCR (not at [facility])     Status: None   Collection Time: 10/04/14  7:55 PM  Result Value Ref Range Status   Toxigenic C Difficile by pcr NEGATIVE NEGATIVE Final       Today   Subjective:   Chauncey Cruel today has no headache,no chest abdominal  pain,no new weakness tingling or numbness, feels much better wants to go home today. No further episodes of GI bleed, tolerated soft diet without complaints  Objective:   Blood pressure 109/76, pulse 69, temperature 98.2 F (36.8 C), temperature source Oral, resp. rate 20, height 5\' 5"  (1.651 m), weight 98.2 kg (216 lb 7.9 oz), SpO2 100 %.   Intake/Output Summary (Last 24 hours) at 10/06/14 1542 Last data filed at 10/05/14 1916  Gross per 24 hour  Intake    240 ml  Output    300 ml  Net    -60 ml    Exam Awake Alert, Oriented x 3, No new F.N deficits, Normal affect [place].AT,PERRAL Supple Neck,No JVD, No cervical lymphadenopathy appriciated.  Symmetrical Chest wall movement, Good air movement bilaterally, CTAB RRR,No Gallops,Rubs or new Murmurs, No Parasternal Heave +ve B.Sounds, Abd Soft, Non tender, No organomegaly appriciated, No rebound -guarding or rigidity. No Cyanosis, Clubbing or edema, No new Rash or bruise  Data Review   CBC w Diff:  Lab Results  Component Value Date   WBC 8.5 10/06/2014   HGB 11.5* 10/06/2014   HCT 35.7* 10/06/2014   PLT 212 10/06/2014   LYMPHOPCT 13 07/03/2013   MONOPCT 3 07/03/2013   EOSPCT 5 07/03/2013   BASOPCT 0 07/03/2013    CMP:  Lab Results  Component Value Date   NA 138 10/05/2014   K 3.6 10/05/2014   CL 103 10/05/2014   CO2 27 10/05/2014  BUN 15 10/05/2014   CREATININE 1.02* 10/05/2014   PROT 7.5 10/04/2014   ALBUMIN 4.0 10/04/2014   BILITOT 0.6 10/04/2014   ALKPHOS 44 10/04/2014   AST 20 10/04/2014   ALT 18 10/04/2014  .   Total Time in preparing paper work, data evaluation and todays exam - 35 minutes  Finola Rosal M.D on 10/06/2014 at 3:42 PM  [facility]   Office  [phone]

## 2014-10-06 NOTE — Progress Notes (Signed)
Pt d/c to home. Discharge instructions, reasons to return to ED/MD, follow up appts, prescriptions, and after visit care reviewed with pt and family at bedside. Pt offered no concerns or questions. Pt and RN signed discharge paper work. PIV removed without complication. Pt escorted to main lobby by PCT Margarette via wheelchair.

## 2014-10-06 NOTE — Progress Notes (Signed)
Pt is free of any significant pain.  Having occasional small liquid stools.  Hungry, eating full liq lunch.  EXAM:   VSS, NAD, abd essent nontender (min subjective tenderness in LLQ).    LABS:  WBC nl, hgb stable, bx's c/w ischemic colitis  IMPR:  Clinically resolving left-sided ischemic colitis.  PLAN:  Ok to advance diet.  I have d/c'd IV b/c it looks like it's starting to infiltrate and she doesn't need IVF's any more.  I have contacted the office of her primary gastroenterologist, Dr. Howell Rucks, asking them to make a f/u ov w/ pt for about a week from now.  I will sign off; call me if any further questions with this patient.  Cleotis Nipper, M.D. Pager [phone] If no answer or after 5 PM call [phone]

## 2014-10-08 LAB — STOOL CULTURE: Special Requests: NORMAL

## 2014-10-20 ENCOUNTER — Other Ambulatory Visit: Payer: Self-pay | Admitting: Internal Medicine

## 2014-10-20 DIAGNOSIS — I739 Peripheral vascular disease, unspecified: Secondary | ICD-10-CM

## 2014-10-22 ENCOUNTER — Ambulatory Visit
Admission: RE | Admit: 2014-10-22 | Discharge: 2014-10-22 | Disposition: A | Discharge status: Home / Self Care | Payer: Medicare Other | Source: Ambulatory Visit | Attending: Internal Medicine | Admitting: Internal Medicine

## 2014-10-22 DIAGNOSIS — I739 Peripheral vascular disease, unspecified: Secondary | ICD-10-CM

## 2015-02-21 ENCOUNTER — Other Ambulatory Visit: Payer: Self-pay | Admitting: Acute Care

## 2015-02-21 ENCOUNTER — Encounter: Payer: Self-pay | Admitting: Acute Care

## 2015-02-21 DIAGNOSIS — Z87891 Personal history of nicotine dependence: Secondary | ICD-10-CM

## 2015-02-23 ENCOUNTER — Ambulatory Visit (INDEPENDENT_AMBULATORY_CARE_PROVIDER_SITE_OTHER)
Admission: RE | Admit: 2015-02-23 | Discharge: 2015-02-23 | Disposition: A | Discharge status: Home / Self Care | Payer: Medicare Other | Source: Ambulatory Visit | Attending: Acute Care | Admitting: Acute Care

## 2015-02-23 ENCOUNTER — Ambulatory Visit (INDEPENDENT_AMBULATORY_CARE_PROVIDER_SITE_OTHER): Payer: Medicare Other | Admitting: Acute Care

## 2015-02-23 ENCOUNTER — Encounter: Payer: Self-pay | Admitting: Acute Care

## 2015-02-23 DIAGNOSIS — Z87891 Personal history of nicotine dependence: Secondary | ICD-10-CM

## 2015-02-23 NOTE — Progress Notes (Signed)
Shared Decision Making Visit Lung Cancer Screening Program [id number])   Eligibility:  Age 74 y.o.  Pack Years Smoking History Calculation 80 pack year smoking history (# packs/per year x # years smoked)  Recent History of coughing up blood  No  Unexplained weight loss? No ( >Than 15 pounds within the last 6 months )  Prior History Lung / other cancer no (Diagnosis within the last 5 years already requiring surveillance chest CT Scans).  Smoking Status Former Smoker  Former Smokers: Years since quit: 6 years  Quit Date: 05/12/2008  Visit Components:  Discussion included one or more decision making aids. yes  Discussion included risk/benefits of screening. yes  Discussion included potential follow up diagnostic testing for abnormal scans. yes  Discussion included meaning and risk of over diagnosis. yes  Discussion included meaning and risk of False Positives. yes  Discussion included meaning of total radiation exposure. yes  Counseling Included:  Importance of adherence to annual lung cancer LDCT screening. yes  Impact of comorbidities on ability to participate in the program. yes  Ability and willingness to under diagnostic treatment. yes  Smoking Cessation Counseling:  Current Smokers:   Discussed importance of smoking cessation. NA; patient is a former smoker  Information about tobacco cessation classes and interventions provided to patient. yes  Patient provided with "ticket" for LDCT Scan. yes  Symptomatic Patient. no  Counseling NA: patient is a former smoker  Diagnosis Code: Tobacco Use Z72.0  Asymptomatic Patient yes  Counseling NA: former smoker  Former Smokers:   Discussed the importance of maintaining cigarette abstinence. yes  Diagnosis Code: Personal History of Nicotine Dependence. [id number]  Information about tobacco cessation classes and interventions provided to patient. Yes  Patient provided with "ticket" for LDCT Scan. yes  Written  Order for Lung Cancer Screening with LDCT placed in Epic. Yes (CT Chest Lung Cancer Screening Low Dose W/O CM) [id number] Z12.2-Screening of respiratory organs Z87.891-Personal history of nicotine dependence  I spent 15 minutes of face to face time with Ms. Pe discussing the risks and benefits of lung cancer screening. We viewed a power point together that explained in detail the above noted topics. We took the time to pause the power point at intervals to allow for questions to be asked and answered to ensure understanding. We discussed that she  had taken the single most powerful action possible to decrease her  risk of developing lung cancer when she  quit smoking. I counseled her to remain smoke free, and to contact me if she ever had the desire to smoke again so that I can provide resources and tools to help support the effort to remain smoke free. We discussed the time and location of the scan, and that either [place] or I will call with the results within  24-48 hours of receiving them. Ms. Storts has my card and contact information in the event she needs to speak with me, in addition to a copy of the power point we reviewed as a resource. She verbalized understanding of all of the above and had no further questions upon leaving the office.    Magdalen Spatz, NP

## 2015-02-24 ENCOUNTER — Telehealth: Payer: Self-pay | Admitting: Acute Care

## 2015-02-24 NOTE — Telephone Encounter (Signed)
I called and explained to this patient that her lung screening scan was read as a Lung RADs 2, nodules with a very low likelihood of becoming a clinically active cancer, benign in appearance and behavior. I told her we will call and schedule her next scan in 02/2016. She verbalized understanding of the above, and has my contact information if she has any questions.

## 2015-03-06 DIAGNOSIS — I2699 Other pulmonary embolism without acute cor pulmonale: Secondary | ICD-10-CM

## 2015-03-06 HISTORY — DX: Other pulmonary embolism without acute cor pulmonale: I26.99

## 2015-03-16 ENCOUNTER — Encounter: Payer: Self-pay | Admitting: Internal Medicine

## 2015-03-16 ENCOUNTER — Ambulatory Visit (INDEPENDENT_AMBULATORY_CARE_PROVIDER_SITE_OTHER): Payer: Medicare Other | Admitting: Internal Medicine

## 2015-03-16 VITALS — BP 140/78 | HR 89 | Ht 66.0 in | Wt 209.2 lb

## 2015-03-16 DIAGNOSIS — M7989 Other specified soft tissue disorders: Secondary | ICD-10-CM | POA: Insufficient documentation

## 2015-03-16 DIAGNOSIS — R0600 Dyspnea, unspecified: Secondary | ICD-10-CM | POA: Diagnosis not present

## 2015-03-16 DIAGNOSIS — J42 Unspecified chronic bronchitis: Secondary | ICD-10-CM | POA: Diagnosis not present

## 2015-03-16 MED ORDER — FLUTICASONE FUROATE-VILANTEROL 100-25 MCG/INH IN AEPB: 1.0000 | INHALATION_SPRAY | Freq: Every morning | RESPIRATORY_TRACT | Status: DC

## 2015-03-16 NOTE — Patient Instructions (Signed)
Please see patient coordinator before you leave today  to schedule venous dopplers and I will call you with results  Try off the BREO as you don't appear to have significant copd - I will let Dr Inda Merlin know   Pulmonary follow up can be as needed

## 2015-03-16 NOTE — Progress Notes (Addendum)
Subjective:    Patient ID: Denise Cline, female    DOB: [birth year],     MRN: [id number]  HPI  89 yobf quit smoking 2010 referred to pulmonary clinic by Dr Inda Merlin 03/16/2015 for ? Copd with no airflow obst on initial spirometry   03/16/2015 1st [facility] office visit/ [facility]   Chief Complaint  Patient presents with  . PULMONARY CONSULT    pt. ref. by dr. Eugene Garnet gates for COPD. pt. states she has some SOB. dry cough X5d. denies wheezing or chest pain/tightness.  sev months prior to OV  Noted more difficulty walking from mb to house due to fatigue and sob BREO helped some  But not using it regularly and ? Causes her hoarseness when she does.   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 No unusual exp hx or h/o childhood pna/ asthma or knowledge of premature birth.  Sleeping ok without nocturnal  or early am exacerbation  of respiratory  c/o's or need for noct saba. Also denies any obvious fluctuation of symptoms with weather or environmental changes or other aggravating or alleviating factors except as outlined above   Current Medications, Allergies, Complete Past Medical History, Past Surgical History, Family History, and Social History were reviewed in [employer] record.              Review of Systems  Constitutional: Positive for unexpected weight change. Negative for fever.  HENT: Positive for sneezing. Negative for congestion, dental problem, ear pain, nosebleeds, postnasal drip, rhinorrhea, sinus pressure, sore throat and trouble swallowing.   Eyes: Negative for redness and itching.  Respiratory: Positive for shortness of breath. Negative for cough, chest tightness and wheezing.   Cardiovascular: Positive for leg  swelling. Negative for palpitations.  Gastrointestinal: Negative for nausea and vomiting.  Genitourinary: Negative for dysuria.  Musculoskeletal: Positive for joint swelling.  Skin: Negative for rash.  Neurological: Negative for headaches.  Hematological: Does not bruise/bleed easily.  Psychiatric/Behavioral: Negative for dysphoric mood. The patient is nervous/anxious.        Objective:   Physical Exam  Hoarse amb wf very unusual affect   Wt Readings from Last 3 Encounters:  03/16/15 209 lb 3.2 oz (94.892 kg)  10/06/14 216 lb 7.9 oz (98.2 kg)  04/01/12 198 lb (89.812 kg)    Vital signs reviewed   HEENT: nl dentition, turbinates, and oropharynx. Nl external ear canals without cough reflex   NECK :  without JVD/Nodes/TM/ nl carotid upstrokes bilaterally   LUNGS: no acc muscle use,  Nl contour chest which is clear to A and P bilaterally without cough on insp or exp maneuvers   CV:  RRR  no s3 or murmur or increase in P2,  Trace edema on L   asym swelling L calf >> R - neg Homan's / no palp chords   ABD:  soft and nontender with nl inspiratory excursion in the supine position. No bruits or organomegaly, bowel sounds nl  MS:  Nl gait/ ext warm without deformities, calf tenderness, cyanosis or clubbing No obvious joint restrictions   SKIN: warm and dry without lesions    NEURO:  alert, approp, nl sensorium with  no motor deficits  I personally reviewed images and agree with radiology impression as follows:  CT Chest   02/23/15  Multiple small pulmonary nodules scattered throughout the lungs bilaterally, the largest of which is in the right lower lobe (image 183 of series 3) demonstrating a volume derived mean diameter of 4.4 mm. No larger more suspicious appearing pulmonary nodules or masses are otherwise noted. There are 2 small thin-walled cysts in the right lung incidentally noted. Areas of mild scarring are noted in the anterior aspect of the left lower lobe  and inferior segment of the lingula. No acute consolidative airspace disease. No pleural effusions.     Assessment & Plan:

## 2015-03-17 ENCOUNTER — Telehealth: Payer: Self-pay | Admitting: Acute Care

## 2015-03-17 ENCOUNTER — Telehealth: Payer: Self-pay

## 2015-03-17 ENCOUNTER — Ambulatory Visit (HOSPITAL_COMMUNITY)
Admission: RE | Admit: 2015-03-17 | Discharge: 2015-03-17 | Disposition: A | Discharge status: Home / Self Care | Payer: Medicare Other | Source: Ambulatory Visit | Attending: Internal Medicine | Admitting: Internal Medicine

## 2015-03-17 ENCOUNTER — Encounter: Payer: Self-pay | Admitting: Internal Medicine

## 2015-03-17 DIAGNOSIS — M7989 Other specified soft tissue disorders: Secondary | ICD-10-CM | POA: Insufficient documentation

## 2015-03-17 DIAGNOSIS — R0600 Dyspnea, unspecified: Secondary | ICD-10-CM | POA: Insufficient documentation

## 2015-03-17 DIAGNOSIS — I10 Essential (primary) hypertension: Secondary | ICD-10-CM | POA: Diagnosis not present

## 2015-03-17 NOTE — Assessment & Plan Note (Addendum)
03/16/2015  Walked RA x 3 laps @ 185 ft each stopped due to  End of study/ no sob/ no desat - spirometry 03/16/2015 no obst   Symptoms are markedly disproportionate to objective findings and not clear this is a lung problem but pt does appear to have difficult airway management issues. DDX of  difficult airways management almost all start with A and  include Adherence, Ace Inhibitors, Acid Reflux, Active Sinus Disease, Alpha 1 Antitripsin deficiency, Anxiety masquerading as Airways dz,  ABPA,  Allergy(esp in young), Aspiration (esp in elderly), Adverse effects of meds,  Active smokers, A bunch of PE's (a small clot burden can't cause this syndrome unless there is already severe underlying pulm or vascular dz with poor reserve) plus two Bs  = Bronchiectasis and Beta blocker use..and one C= CHF   Adherence is always the initial "prime suspect" and is a multilayered concern that requires a "trust but verify" approach in every patient - starting with knowing how to use medications, especially inhalers, correctly, keeping up with refills and understanding the fundamental difference between maintenance and prns vs those medications only taken for a very short course and then stopped and not refilled.  - not using breo regularly and not clear as to benefit vs side effects > ok to stop and see if makes a difference (could have asthma but we didn't detect it at all today)  ? Adverse effects of meds ? breo related hoarseness > try off DPI's - if worse would suggest asthma rec rx with low dose laba/ics like symb 80 or dulera 100   ? A bunch of  PE's > unlikely based on today's walk study but at risk due to L leg h/o dvt > repeat venous dopplers on L   ? Anxiety > usually at the bottom of this list of usual suspects but should be much higher on this pt's based on H and P and note already on psychotropics .      Not a candidate for rehab as no dx of COPD but would benefit from regular exercise for sure    I had  an extended discussion with the patient and son reviewing all relevant studies completed to date and  lasting 35 minutes of a 60 minute visit.  Each maintenance medication was reviewed in detail including most importantly the difference between maintenance and prns and under what circumstances the prns are to be triggered using an action plan format that is not reflected in the computer generated alphabetically organized AVS.    Please see instructions for details which were reviewed in writing and the patient given a copy highlighting the part that I personally wrote and discussed at today's ov.

## 2015-03-17 NOTE — Assessment & Plan Note (Signed)
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

## 2015-03-17 NOTE — Telephone Encounter (Signed)
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  Preliminary report is in Epic.   MW please advise.  Thanks!

## 2015-03-17 NOTE — Telephone Encounter (Signed)
I called spoke with pt. She wanted to know who she needed to call about the "plaques" seen on her scan.  Advised her per Judson Roch she faxed report over to Dr. Inda Merlin this AM.  She will call his office. She needed nothing further.

## 2015-03-17 NOTE — Telephone Encounter (Signed)
Jan. 12, 2017                                                                           Chauncey Cruel: DOB: 09/16/1954  Dr. Inda Merlin Mrs. [place]'s Low Dose Screening CT was read as a Lung RADS 2, indicating nodules with a very low likelihood of becoming a clinically active cancer due to size or lack of growth, benign in appearance and behavior. The recommendation is for a repeat scan in 12 months, which we will schedule and order in Dec. 2017. These results have been called to Mrs. Pincock, who verbalized understanding. Please see the complete scan report attached. Please note the incidental findings of atherosclerosis including 2 vessel CAD. Please note severity of disease and any potential stenosis cannot be assessed on this non-gated exam. Assessment for potential risk factor modification, dietary therapy, or pharmacologic therapy may be warranted, if clinically indicated. 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 Please follow up as you feel is clinically indicated as you know this patient's medical history well. Thank you so much for the referral. Please let me know if you have any questions or concerns regarding the LDCT results.  Eric Form, NP [facility] [phone]  This was refaxed 03/17/15  Additionally,I have called and left a phone message alerting Dr. Inda Merlin to the fact I have refaxed the report. I have requested that they call me if they would like me to make a consult to [facility].

## 2015-03-17 NOTE — Progress Notes (Signed)
Preliminary results by tech - Left Lower Ext. Venous Duplex Completed. Positive for chronic appearing DVT in the posterior tibial veins and peroneal veins. Results given to [facility]. Oda Cogan, BS, RDMS, RVT

## 2015-03-18 NOTE — Telephone Encounter (Signed)
Nothing further needed here, see result note

## 2015-03-21 NOTE — Progress Notes (Signed)
Quick Note:  Spoke with pt and notified of results per Dr. Wert. Pt verbalized understanding and denied any questions.  ______ 

## 2015-04-26 ENCOUNTER — Other Ambulatory Visit: Payer: Self-pay | Admitting: Internal Medicine

## 2015-04-26 ENCOUNTER — Ambulatory Visit
Admission: RE | Admit: 2015-04-26 | Discharge: 2015-04-26 | Disposition: A | Discharge status: Home / Self Care | Payer: Medicare Other | Source: Ambulatory Visit | Attending: Internal Medicine | Admitting: Internal Medicine

## 2015-04-26 DIAGNOSIS — M25562 Pain in left knee: Secondary | ICD-10-CM

## 2015-04-26 DIAGNOSIS — M25512 Pain in left shoulder: Secondary | ICD-10-CM

## 2015-05-12 ENCOUNTER — Encounter: Payer: Self-pay | Admitting: Interventional Cardiology

## 2015-05-12 ENCOUNTER — Ambulatory Visit (INDEPENDENT_AMBULATORY_CARE_PROVIDER_SITE_OTHER): Payer: Medicare Other | Admitting: Interventional Cardiology

## 2015-05-12 VITALS — BP 126/78 | HR 82 | Ht 66.0 in | Wt 201.0 lb

## 2015-05-12 DIAGNOSIS — I10 Essential (primary) hypertension: Secondary | ICD-10-CM | POA: Diagnosis not present

## 2015-05-12 DIAGNOSIS — I739 Peripheral vascular disease, unspecified: Secondary | ICD-10-CM

## 2015-05-12 DIAGNOSIS — J42 Unspecified chronic bronchitis: Secondary | ICD-10-CM

## 2015-05-12 DIAGNOSIS — I712 Thoracic aortic aneurysm, without rupture, unspecified: Secondary | ICD-10-CM | POA: Insufficient documentation

## 2015-05-12 DIAGNOSIS — I2510 Atherosclerotic heart disease of native coronary artery without angina pectoris: Secondary | ICD-10-CM | POA: Insufficient documentation

## 2015-05-12 NOTE — Progress Notes (Signed)
Cardiology Office Note   Date:  05/12/2015   ID:  CHRISTEAN REZEK, DOB 1940-12-21, MRN [id number]  PCP:  Henrine Screws, MD  Cardiologist:  Sinclair Grooms, MD   Chief Complaint  Patient presents with  . Coronary Artery Disease      History of Present Illness: Denise Cline is a 75 y.o. female who presents for CAD identified by screening CT scan due to the presence of calcification.  The patient has a history of hypertension, tobacco use, thoracic aneurysm, hyperlipidemia, obesity, and generalized stress disorder. A low energy CT scan was performed to screen for lung cancer. This study demonstrated multiple nodules that need to have longitudinal follow-up, enlargement of the ascending aorta (4.5 cm), and two-vessel coronary artery disease. She denies chest discomfort. She does have dyspnea on exertion which is always been felt secondary to chronic lung disease. She denies orthopnea, PND, palpitations, and syncope.    Past Medical History  Diagnosis Date  . History of shingles   . Hypertension   . Thyroid disease     Nodules  . Fibroid   . Anxiety   . Varicose veins   . COPD (chronic obstructive pulmonary disease) [facility])     Past Surgical History  Procedure Laterality Date  . Abdominal hysterectomy    . Oophorectomy      One ovary removed  . Thyroid nodules    . Thyroid surgery    . Colonoscopy N/A 10/05/2014    Procedure: COLONOSCOPY;  Surgeon: Ronald Lobo, MD;  Location: [facility];  Service: Endoscopy;  Laterality: N/A;     Current Outpatient Prescriptions  Medication Sig Dispense Refill  . buPROPion (WELLBUTRIN SR) 150 MG 12 hr tablet Take 150 mg by mouth daily.    . fluticasone furoate-vilanterol (BREO ELLIPTA) 100-25 MCG/INH AEPB Inhale 1 puff into the lungs daily as needed (shortness of breath).    . Multiple Vitamins-Minerals (CENTRUM SILVER ADULT 50+) TABS Take 1 tablet by mouth daily.    Marland Kitchen OVER THE COUNTER MEDICATION Take 1 capsule by  mouth every other day. Med Name: COLON CLENZ    . traMADol (ULTRAM) 50 MG tablet Take 50 mg by mouth every 6 (six) hours as needed for moderate pain.    . valsartan-hydrochlorothiazide (DIOVAN-HCT) 160-25 MG per tablet Take 1 tablet by mouth daily.     No current facility-administered medications for this visit.    Allergies:   Penicillins; Sulfa antibiotics; and Oxycodone    Social History:  The patient  reports that she quit smoking about 6 years ago. Her smoking use included Cigarettes. She has a 80 pack-year smoking history. She quit smokeless tobacco use about 7 years ago. She reports that she does not drink alcohol or use illicit drugs.   Family History:  The patient's family history includes Crohn's disease in her mother; Diabetes in her sister; Hypertension in her brother and sister; Stroke in her father.    ROS:  Please see the history of present illness.   Otherwise, review of systems are positive for depression, anxiety, cough, leg swelling, leg pain, and unexplained weight gain. She has recent diagnosis of chronic venous stasis in the left lower extremity. Probable prior DVT. Previously on Xarelto which she has stopped without instruction from her physician..   All other systems are reviewed and negative.    PHYSICAL EXAM: VS:  BP 126/78 mmHg  Pulse 82  Ht 5\' 6"  (1.676 m)  Wt 201 lb (91.173 kg)  BMI 32.46  kg/m2 , BMI Body mass index is 32.46 kg/(m^2). GEN: Well nourished, well developed, in no acute distress HEENT: normal Neck: no JVD, carotid bruits, or masses Cardiac: RRR.  There is no murmur, rub, or gallop. There is no edema. Respiratory:  clear to auscultation bilaterally, normal work of breathing. GI: soft, nontender, nondistended, + BS MS: no deformity or atrophy Skin: warm and dry, no rash Neuro:  Strength and sensation are intact Psych: euthymic mood, full affect   EKG:  EKG is ordered today. The ekg reveals normal sinus rhythm with sinus arrhythmia and  nonspecific ST abnormality   Recent Labs: 10/04/2014: ALT 18 10/05/2014: BUN 15; Creatinine, Ser 1.02*; Potassium 3.6; Sodium 138 10/06/2014: Hemoglobin 11.5*; Platelets 212    Lipid Panel    Component Value Date/Time   CHOL  05/15/2009 0555    187        ATP III CLASSIFICATION:  <200     mg/dL   Desirable  200-239  mg/dL   Borderline High  >=240    mg/dL   High          TRIG 112 05/15/2009 0555   HDL 51 05/15/2009 0555   CHOLHDL 3.7 05/15/2009 0555   VLDL 22 05/15/2009 0555   LDLCALC * 05/15/2009 0555    114        Total Cholesterol/HDL:CHD Risk Coronary Heart Disease Risk Table                     Men   Women  1/2 Average Risk   3.4   3.3  Average Risk       5.0   4.4  2 X Average Risk   9.6   7.1  3 X Average Risk  23.4   11.0        Use the calculated Patient Ratio above and the CHD Risk Table to determine the patient's CHD Risk.        ATP III CLASSIFICATION (LDL):  <100     mg/dL   Optimal  100-129  mg/dL   Near or Above                    Optimal  130-159  mg/dL   Borderline  160-189  mg/dL   High  >190     mg/dL   Very High      Wt Readings from Last 3 Encounters:  05/12/15 201 lb (91.173 kg)  03/16/15 209 lb 3.2 oz (94.892 kg)  10/06/14 216 lb 7.9 oz (98.2 kg)      Other studies Reviewed: Additional studies/ records that were reviewed today include: CT report performed recently which identified coronary artery two-vessel calcification. Also an ascending thoracic aortic aneurysm measuring 4.5 cm.. The findings include multiple nodules were noted on the CT and surveillance repeat studies will be necessary..    ASSESSMENT AND PLAN:  2. Essential hypertension Well controlled  3. PVD (peripheral vascular disease) ([place]) Asymptomatic  5. Chronic bronchitis, unspecified chronic bronchitis type ([place]) Followed by pulmonary  1. Coronary artery calcification seen on CAT scan No clinical symptoms to suggest angina unless dyspnea isn't equivalent  4.  Thoracic aortic aneurysm without rupture (HCC) 4.5 cm on recent CT    Current medicines are reviewed at length with the patient today.  The patient has the following concerns regarding medicines: none.  The following changes/actions have been instituted:    Will need to have repeat CT scan of the chest performed in  1 year to follow aortic aneurysm  Pharmacologic myocardial perfusion study to exclude significant ischemia.  Labs/ tests ordered today include:   Orders Placed This Encounter  Procedures  . Myocardial Perfusion Imaging  . EKG 12-Lead     Disposition:   FU with HS in PRN    Signed, Sinclair Grooms, MD  05/12/2015 4:00 PM    [facility] [address], [place], [place]  [address] Phone: [phone]; Fax: [phone]

## 2015-05-12 NOTE — Patient Instructions (Signed)
Medication Instructions:  Your physician recommends that you continue on your current medications as directed. Please refer to the Current Medication list given to you today.   Labwork: None ordered  Testing/Procedures: Your physician has requested that you have a lexiscan myoview. For further information please visit [email or web address]. Please follow instruction sheet, as given.   Follow-Up: Your physician recommends that you schedule a follow-up appointment pending test results   Any Other Special Instructions Will Be Listed Below (If Applicable).     If you need a refill on your cardiac medications before your next appointment, please call your pharmacy.

## 2015-05-17 ENCOUNTER — Telehealth (HOSPITAL_COMMUNITY): Payer: Self-pay | Admitting: *Deleted

## 2015-05-17 NOTE — Telephone Encounter (Signed)
Patient given detailed instructions per Myocardial Perfusion Study Information Sheet for the test on 05/19/15 at 0730. Patient notified to arrive 15 minutes early and that it is imperative to arrive on time for appointment to keep from having the test rescheduled.  If you need to cancel or reschedule your appointment, please call the office within 24 hours of your appointment. Failure to do so may result in a cancellation of your appointment, and a $50 no show fee. Patient verbalized understanding.Laramie Gelles, Ranae Palms

## 2015-05-17 NOTE — Telephone Encounter (Signed)
Patient given detailed instructions per Myocardial Perfusion Study Information Sheet for the test on 05/19/15 at 0730. Patient notified to arrive 15 minutes early and that it is imperative to arrive on time for appointment to keep from having the test rescheduled.  If you need to cancel or reschedule your appointment, please call the office within 24 hours of your appointment. Failure to do so may result in a cancellation of your appointment, and a $50 no show fee. Patient verbalized understanding.Michaeline Eckersley, Ranae Palms

## 2015-05-19 ENCOUNTER — Ambulatory Visit (HOSPITAL_COMMUNITY): Payer: Medicare Other | Attending: Cardiology

## 2015-05-19 DIAGNOSIS — R0609 Other forms of dyspnea: Secondary | ICD-10-CM | POA: Insufficient documentation

## 2015-05-19 DIAGNOSIS — I739 Peripheral vascular disease, unspecified: Secondary | ICD-10-CM | POA: Insufficient documentation

## 2015-05-19 DIAGNOSIS — I2510 Atherosclerotic heart disease of native coronary artery without angina pectoris: Secondary | ICD-10-CM | POA: Insufficient documentation

## 2015-05-19 LAB — MYOCARDIAL PERFUSION IMAGING
CHL CUP RESTING HR STRESS: 63 {beats}/min
Peak HR: 81 {beats}/min

## 2015-05-19 MED ORDER — TECHNETIUM TC 99M SESTAMIBI GENERIC - CARDIOLITE
10.9000 | Freq: Once | INTRAVENOUS | Status: AC | PRN
  Administered 2015-05-19: 10.9 via INTRAVENOUS

## 2015-05-19 MED ORDER — TECHNETIUM TC 99M SESTAMIBI GENERIC - CARDIOLITE
32.3000 | Freq: Once | INTRAVENOUS | Status: AC | PRN
  Administered 2015-05-19: 32.3 via INTRAVENOUS

## 2015-05-19 MED ORDER — REGADENOSON 0.4 MG/5ML IV SOLN
0.4000 mg | Freq: Once | INTRAVENOUS | Status: AC
  Administered 2015-05-19: 0.4 mg via INTRAVENOUS

## 2015-05-25 ENCOUNTER — Other Ambulatory Visit: Payer: Self-pay | Admitting: Acute Care

## 2015-05-25 DIAGNOSIS — Z87891 Personal history of nicotine dependence: Secondary | ICD-10-CM

## 2015-08-23 ENCOUNTER — Emergency Department (HOSPITAL_COMMUNITY): Payer: Medicare Other

## 2015-08-23 ENCOUNTER — Encounter (HOSPITAL_COMMUNITY): Payer: Self-pay

## 2015-08-23 ENCOUNTER — Other Ambulatory Visit: Payer: Self-pay

## 2015-08-23 ENCOUNTER — Emergency Department (HOSPITAL_COMMUNITY)
Admission: EM | Admit: 2015-08-23 | Discharge: 2015-08-23 | Disposition: A | Discharge status: Home / Self Care | Payer: Medicare Other | Attending: Emergency Medicine | Admitting: Emergency Medicine

## 2015-08-23 DIAGNOSIS — J449 Chronic obstructive pulmonary disease, unspecified: Secondary | ICD-10-CM | POA: Insufficient documentation

## 2015-08-23 DIAGNOSIS — M25512 Pain in left shoulder: Secondary | ICD-10-CM | POA: Insufficient documentation

## 2015-08-23 DIAGNOSIS — I868 Varicose veins of other specified sites: Secondary | ICD-10-CM | POA: Insufficient documentation

## 2015-08-23 DIAGNOSIS — Z87891 Personal history of nicotine dependence: Secondary | ICD-10-CM | POA: Insufficient documentation

## 2015-08-23 DIAGNOSIS — Z79899 Other long term (current) drug therapy: Secondary | ICD-10-CM | POA: Diagnosis not present

## 2015-08-23 DIAGNOSIS — I10 Essential (primary) hypertension: Secondary | ICD-10-CM | POA: Diagnosis not present

## 2015-08-23 MED ORDER — KETOROLAC TROMETHAMINE 60 MG/2ML IM SOLN
15.0000 mg | Freq: Once | INTRAMUSCULAR | Status: AC
  Administered 2015-08-23: 15 mg via INTRAMUSCULAR
  Filled 2015-08-23: qty 2

## 2015-08-23 MED ORDER — IBUPROFEN 200 MG PO TABS: 400.0000 mg | ORAL_TABLET | Freq: Two times a day (BID) | ORAL | Status: DC

## 2015-08-23 MED ORDER — TRAMADOL HCL 50 MG PO TABS: 50.0000 mg | ORAL_TABLET | Freq: Four times a day (QID) | ORAL | Status: AC | PRN

## 2015-08-23 NOTE — ED Provider Notes (Signed)
CSN: [id number]     Arrival date & time 08/23/15  1406 History   First MD Initiated Contact with Patient 08/23/15 1858     Chief Complaint  Patient presents with  . Shoulder Pain     (Consider location/radiation/quality/duration/timing/severity/associated sxs/prior Treatment) Patient is a 75 y.o. female presenting with shoulder pain.  Shoulder Pain Location:  Shoulder Injury: no   Shoulder location:  L shoulder Pain details:    Quality:  Aching and sharp   Radiates to:  Does not radiate   Duration:  3 months   Timing:  Constant   Progression:  Worsening Chronicity:  Chronic Handedness:  Left-handed Dislocation: no   Foreign body present:  No foreign bodies Prior injury to area:  No   Past Medical History  Diagnosis Date  . History of shingles   . Hypertension   . Thyroid disease     Nodules  . Fibroid   . Anxiety   . Varicose veins   . COPD (chronic obstructive pulmonary disease) [facility])    Past Surgical History  Procedure Laterality Date  . Abdominal hysterectomy    . Oophorectomy      One ovary removed  . Thyroid nodules    . Thyroid surgery    . Colonoscopy N/A 10/05/2014    Procedure: COLONOSCOPY;  Surgeon: Ronald Lobo, MD;  Location: [facility];  Service: Endoscopy;  Laterality: N/A;   Family History  Problem Relation Age of Onset  . Crohn's disease Mother   . Stroke Father   . Diabetes Sister   . Hypertension Sister   . Hypertension Brother    Social History  Substance Use Topics  . Smoking status: Former Smoker -- 2.00 packs/day for 40 years    Types: Cigarettes    Quit date: 05/30/2008  . Smokeless tobacco: Former Systems developer    Quit date: 05/12/2008     Comment: Counseled to remain smoke free  . Alcohol Use: No     Comment: Rare   OB History    Gravida Para Term Preterm AB TAB SAB Ectopic Multiple Living   3 3 3       3      Review of Systems  Respiratory: Negative for cough and shortness of breath.   Cardiovascular: Negative for chest pain  and leg swelling.  All other systems reviewed and are negative.     Allergies  Penicillins; Sulfa antibiotics; and Oxycodone  Home Medications   Prior to Admission medications   Medication Sig Start Date End Date Taking? Authorizing Provider  buPROPion (WELLBUTRIN SR) 150 MG 12 hr tablet Take 150 mg by mouth daily.   Yes Historical Provider, MD  fluticasone furoate-vilanterol (BREO ELLIPTA) 100-25 MCG/INH AEPB Inhale 1 puff into the lungs daily as needed (shortness of breath).   Yes Historical Provider, MD  Multiple Vitamins-Minerals (CENTRUM SILVER ADULT 50+) TABS Take 1 tablet by mouth daily.   Yes Historical Provider, MD  OVER THE COUNTER MEDICATION Take 1 capsule by mouth every other day. Med Name: COLON CLENZ   Yes Historical Provider, MD  rivaroxaban (XARELTO) 10 MG TABS tablet Take 10 mg by mouth daily.   Yes Historical Provider, MD  valsartan-hydrochlorothiazide (DIOVAN-HCT) 160-25 MG per tablet Take 1 tablet by mouth daily.   Yes Historical Provider, MD  ibuprofen (ADVIL,MOTRIN) 200 MG tablet Take 2 tablets (400 mg total) by mouth 2 (two) times daily. 08/23/15   Merrily Pew, MD  traMADol (ULTRAM) 50 MG tablet Take 1 tablet (50 mg total) by  mouth every 6 (six) hours as needed for moderate pain. 08/23/15   Corene Cornea Sorayah Schrodt, MD   BP 179/93 mmHg  Pulse 50  Temp(Src) 97.5 F (36.4 C) (Oral)  Resp 16  SpO2 100% Physical Exam  Constitutional: She appears well-developed and well-nourished.  HENT:  Head: Normocephalic and atraumatic.  Neck: Normal range of motion.  Cardiovascular: Normal rate and regular rhythm.   Pulmonary/Chest: No stridor. No respiratory distress.  Abdominal: Soft. She exhibits no distension. There is no tenderness.  Musculoskeletal: She exhibits tenderness (palpation of biceps tendon and anterior rotator cuff).  Neurological: She is alert.  Nursing note and vitals reviewed.   ED Course  Procedures (including critical care time) Labs Review Labs Reviewed -  No data to display  Imaging Review Dg Shoulder Left  08/23/2015  CLINICAL DATA:  Left shoulder pain for several months. No known injury. EXAM: LEFT SHOULDER - 2+ VIEW COMPARISON:  04/26/2015 FINDINGS: Mild AC joint degenerative changes. The glenohumeral joint is maintained. No acute bony findings. No abnormal soft tissue calcifications. The left lung is grossly clear. IMPRESSION: No acute bony findings or significant degenerative changes. Electronically Signed   By: Marijo Sanes M.D.   On: 08/23/2015 19:40   I have personally reviewed and evaluated these images and lab results as part of my medical decision-making.   EKG Interpretation   Date/Time:  Tuesday August 23 2015 20:07:34 EDT Ventricular Rate:  52 PR Interval:    QRS Duration: 79 QT Interval:  488 QTC Calculation: [id number] R Axis:   24 Text Interpretation:  Sinus rhythm Abnormal R-wave progression, early  transition No significant change since last tracing Confirmed by  Winfred Leeds  MD, SAM [phone]) on 08/23/2015 8:30:25 PM      MDM   Final diagnoses:  Left shoulder pain   75 year old female with left shoulder pain likely secondary to a biceps tendinitis versus rotator cuff injury. Chronic in nature we'll be okay to follow up with primary doctor for orthopedic referral. Doubt any cardiac cause without any associated symptoms. No fracture or evidence of healing fracture or malignancy on x-ray.  New Prescriptions: Discharge Medication List as of 08/23/2015  8:52 PM      I have personally and contemperaneously reviewed labs and imaging and used in my decision making as above.   A medical screening exam was performed and I feel the patient has had an appropriate workup for their chief complaint at this time and likelihood of emergent condition existing is low and thus workup can continue on an outpatient basis.. Their vital signs are stable. They have been counseled on decision, discharge, follow up and which symptoms necessitate  immediate return to the emergency department.  They verbally stated understanding and agreement with plan and discharged in stable condition.      Merrily Pew, MD 08/24/15 [phone]

## 2015-08-23 NOTE — ED Notes (Addendum)
Pt with shoulder pain to left arm.  States feels tingling.  Pt states symptoms for months.  Denies injury.  Denies chest pain. Shoulder hurts more with physical movement

## 2015-08-23 NOTE — ED Notes (Signed)
Bed: WA14 Expected date:  Expected time:  Means of arrival:  Comments: 

## 2015-11-11 ENCOUNTER — Inpatient Hospital Stay (HOSPITAL_COMMUNITY)
Admission: EM | Admit: 2015-11-11 | Discharge: 2015-11-14 | DRG: 175 | Disposition: A | Discharge status: Home / Self Care | Payer: Medicare Other | Attending: Internal Medicine | Admitting: Internal Medicine

## 2015-11-11 ENCOUNTER — Emergency Department (HOSPITAL_COMMUNITY): Payer: Medicare Other

## 2015-11-11 ENCOUNTER — Encounter (HOSPITAL_COMMUNITY): Payer: Self-pay | Admitting: Emergency Medicine

## 2015-11-11 DIAGNOSIS — Z86718 Personal history of other venous thrombosis and embolism: Secondary | ICD-10-CM

## 2015-11-11 DIAGNOSIS — J449 Chronic obstructive pulmonary disease, unspecified: Secondary | ICD-10-CM | POA: Diagnosis present

## 2015-11-11 DIAGNOSIS — I2699 Other pulmonary embolism without acute cor pulmonale: Secondary | ICD-10-CM | POA: Diagnosis not present

## 2015-11-11 DIAGNOSIS — Z79899 Other long term (current) drug therapy: Secondary | ICD-10-CM

## 2015-11-11 DIAGNOSIS — I714 Abdominal aortic aneurysm, without rupture: Secondary | ICD-10-CM | POA: Diagnosis present

## 2015-11-11 DIAGNOSIS — I82412 Acute embolism and thrombosis of left femoral vein: Secondary | ICD-10-CM | POA: Diagnosis present

## 2015-11-11 DIAGNOSIS — I2609 Other pulmonary embolism with acute cor pulmonale: Secondary | ICD-10-CM | POA: Diagnosis not present

## 2015-11-11 DIAGNOSIS — I2510 Atherosclerotic heart disease of native coronary artery without angina pectoris: Secondary | ICD-10-CM | POA: Diagnosis present

## 2015-11-11 DIAGNOSIS — Z87891 Personal history of nicotine dependence: Secondary | ICD-10-CM

## 2015-11-11 DIAGNOSIS — R609 Edema, unspecified: Secondary | ICD-10-CM | POA: Diagnosis not present

## 2015-11-11 DIAGNOSIS — I10 Essential (primary) hypertension: Secondary | ICD-10-CM | POA: Diagnosis present

## 2015-11-11 DIAGNOSIS — R52 Pain, unspecified: Secondary | ICD-10-CM

## 2015-11-11 DIAGNOSIS — I82409 Acute embolism and thrombosis of unspecified deep veins of unspecified lower extremity: Secondary | ICD-10-CM

## 2015-11-11 DIAGNOSIS — Z7982 Long term (current) use of aspirin: Secondary | ICD-10-CM

## 2015-11-11 DIAGNOSIS — I82442 Acute embolism and thrombosis of left tibial vein: Secondary | ICD-10-CM | POA: Diagnosis present

## 2015-11-11 DIAGNOSIS — M7989 Other specified soft tissue disorders: Secondary | ICD-10-CM | POA: Diagnosis present

## 2015-11-11 DIAGNOSIS — I739 Peripheral vascular disease, unspecified: Secondary | ICD-10-CM | POA: Diagnosis present

## 2015-11-11 DIAGNOSIS — Z880 Allergy status to penicillin: Secondary | ICD-10-CM

## 2015-11-11 LAB — BASIC METABOLIC PANEL
Anion gap: 7 (ref 5–15)
BUN: 24 mg/dL — ABNORMAL HIGH (ref 6–20)
CALCIUM: 9 mg/dL (ref 8.9–10.3)
CHLORIDE: 105 mmol/L (ref 101–111)
CO2: 29 mmol/L (ref 22–32)
CREATININE: 0.85 mg/dL (ref 0.44–1.00)
GFR calc Af Amer: >60 mL/min (ref >60)
GFR calc non Af Amer: >60 mL/min (ref >60)
GLUCOSE: 93 mg/dL (ref 65–99)
Potassium: 4 mmol/L (ref 3.5–5.1)
Sodium: 141 mmol/L (ref 135–145)

## 2015-11-11 LAB — PROTIME-INR
INR: 0.96
PROTHROMBIN TIME: 12.8 s (ref 11.4–15.2)

## 2015-11-11 LAB — CBC
HEMATOCRIT: 37.1 % (ref 36.0–46.0)
HEMOGLOBIN: 12.7 g/dL (ref 12.0–15.0)
MCH: 32.1 pg (ref 26.0–34.0)
MCHC: 34.2 g/dL (ref 30.0–36.0)
MCV: 93.7 fL (ref 78.0–100.0)
Platelets: 188 10*3/uL (ref 150–400)
RBC: 3.96 MIL/uL (ref 3.87–5.11)
RDW: 14.4 % (ref 11.5–15.5)
WBC: 6.4 10*3/uL (ref 4.0–10.5)

## 2015-11-11 LAB — TROPONIN I: Troponin I: 0.03 ng/mL (ref <0.03)

## 2015-11-11 LAB — HEPARIN LEVEL (UNFRACTIONATED): Heparin Unfractionated: 0.55 IU/mL (ref 0.30–0.70)

## 2015-11-11 MED ORDER — FLUTICASONE FUROATE-VILANTEROL 100-25 MCG/INH IN AEPB
1.0000 | INHALATION_SPRAY | Freq: Every day | RESPIRATORY_TRACT | Status: DC
  Administered 2015-11-12 – 2015-11-14 (×3): 1 via RESPIRATORY_TRACT
  Filled 2015-11-11: qty 28

## 2015-11-11 MED ORDER — SODIUM CHLORIDE 0.9% FLUSH
3.0000 mL | Freq: Two times a day (BID) | INTRAVENOUS | Status: DC
  Administered 2015-11-13 – 2015-11-14 (×3): 3 mL via INTRAVENOUS

## 2015-11-11 MED ORDER — BUPROPION HCL ER (SR) 150 MG PO TB12
150.0000 mg | ORAL_TABLET | Freq: Every day | ORAL | Status: DC
  Administered 2015-11-11 – 2015-11-14 (×4): 150 mg via ORAL
  Filled 2015-11-11 (×4): qty 1

## 2015-11-11 MED ORDER — IOPAMIDOL (ISOVUE-370) INJECTION 76%
100.0000 mL | Freq: Once | INTRAVENOUS | Status: AC | PRN
  Administered 2015-11-11: 100 mL via INTRAVENOUS

## 2015-11-11 MED ORDER — HEPARIN BOLUS VIA INFUSION
2000.0000 [IU] | Freq: Once | INTRAVENOUS | Status: AC
  Administered 2015-11-11: 2000 [IU] via INTRAVENOUS
  Filled 2015-11-11: qty 2000

## 2015-11-11 MED ORDER — IRBESARTAN 150 MG PO TABS
150.0000 mg | ORAL_TABLET | Freq: Every day | ORAL | Status: DC
  Administered 2015-11-11 – 2015-11-14 (×4): 150 mg via ORAL
  Filled 2015-11-11 (×4): qty 1

## 2015-11-11 MED ORDER — VALSARTAN-HYDROCHLOROTHIAZIDE 160-25 MG PO TABS: 1.0000 | ORAL_TABLET | Freq: Every day | ORAL | Status: DC

## 2015-11-11 MED ORDER — HYDROCHLOROTHIAZIDE 25 MG PO TABS
25.0000 mg | ORAL_TABLET | Freq: Every day | ORAL | Status: DC
  Administered 2015-11-11 – 2015-11-14 (×4): 25 mg via ORAL
  Filled 2015-11-11 (×4): qty 1

## 2015-11-11 MED ORDER — TRAMADOL HCL 50 MG PO TABS
50.0000 mg | ORAL_TABLET | Freq: Four times a day (QID) | ORAL | Status: DC | PRN
  Administered 2015-11-11 – 2015-11-12 (×4): 50 mg via ORAL
  Filled 2015-11-11 (×4): qty 1

## 2015-11-11 MED ORDER — ADULT MULTIVITAMIN W/MINERALS CH
1.0000 | ORAL_TABLET | Freq: Every day | ORAL | Status: DC
  Administered 2015-11-11 – 2015-11-14 (×4): 1 via ORAL
  Filled 2015-11-11 (×7): qty 1

## 2015-11-11 MED ORDER — MORPHINE SULFATE (PF) 2 MG/ML IV SOLN
2.0000 mg | Freq: Once | INTRAVENOUS | Status: AC
  Administered 2015-11-11: 2 mg via INTRAVENOUS
  Filled 2015-11-11: qty 1

## 2015-11-11 MED ORDER — ASPIRIN EC 81 MG PO TBEC
81.0000 mg | DELAYED_RELEASE_TABLET | Freq: Every day | ORAL | Status: DC
  Administered 2015-11-11 – 2015-11-12 (×2): 81 mg via ORAL
  Filled 2015-11-11 (×2): qty 1

## 2015-11-11 MED ORDER — HEPARIN (PORCINE) IN NACL 100-0.45 UNIT/ML-% IJ SOLN
1200.0000 [IU]/h | INTRAMUSCULAR | Status: DC
  Administered 2015-11-11: 1200 [IU]/h via INTRAVENOUS
  Filled 2015-11-11 (×2): qty 250

## 2015-11-11 NOTE — Progress Notes (Signed)
ANTICOAGULATION CONSULT NOTE - Initial Consult  Pharmacy Consult for Heparin Indication: pulmonary embolus  Allergies  Allergen Reactions  . Penicillins Anaphylaxis    Has patient had a PCN reaction causing immediate rash, facial/tongue/throat swelling, SOB or lightheadedness with hypotension: yes Has patient had a PCN reaction causing severe rash involving mucus membranes or skin necrosis: no Has patient had a PCN reaction that required hospitalization yes Has patient had a PCN reaction occurring within the last 10 years: yes If all of the above answers are "NO", then may proceed with Cephalosporin use.   . Sulfa Antibiotics Hives  . Oxycodone Other (See Comments)    hallucanations    Patient Measurements: Height: 5\' 5"  (165.1 cm) Weight: 200 lb (90.7 kg) IBW/kg (Calculated) : 57 Heparin Dosing Weight: 77kg  Vital Signs: Temp: 98 F (36.7 C) (09/08 1043) Temp Source: Oral (09/08 1043) BP: 152/91 (09/08 1330) Pulse Rate: 58 (09/08 1330)  Labs:  Recent Labs  11/11/15 1301  HGB 12.7  HCT 37.1  PLT 188  CREATININE 0.85   Estimated Creatinine Clearance: 61.7 mL/min (by C-G formula based on SCr of 0.85 mg/dL).  Medical History: Past Medical History:  Diagnosis Date  . Anxiety   . COPD (chronic obstructive pulmonary disease) ([place])   . Fibroid   . History of shingles   . Hypertension   . Thyroid disease    Nodules  . Varicose veins    Medications:  Scheduled:  . heparin  2,000 Units Intravenous Once   Infusions:  . heparin     Assessment: 1 yoF to ED with c/o head and neck pain. Head CT neg for acute process, R sided PE noted. No anti-coagulants PTA, hx of DVT in 2014, baseline CBC wnl.  Goal of Therapy:  Heparin level 0.3-0.7 units/ml Monitor platelets by anticoagulation protocol: Yes   Plan:   Begin Heparin per pharmacy, bolus 2000 units, infusion at 1200 units/hr  Check 6 hr Heparin level  Daily CBC, Heparin level (when at steady  state)  Monitor for signs/sx bleeding  Await decision for oral anti-coagulation  Minda Ditto PharmD Pager [phone] 11/11/2015, 3:22 PM

## 2015-11-11 NOTE — ED Notes (Signed)
Pt transported to CT ?

## 2015-11-11 NOTE — ED Triage Notes (Signed)
Pt presents to ED with complaints of left-sided neck pain that radiates to the back of her head and her spine. She reports that she had a blood clot (Jan 2014) which he doctor told her it has dissolved on its own. She has concerns that she may have another blood clot. Currently rating pain 8/10. Pt also reports concern of area of redness on her left shoulder, she is concerned that this may be an insect bite of some kind.

## 2015-11-11 NOTE — ED Provider Notes (Signed)
[facility] DEPT Provider Note   CSN: [id number] Arrival date & time: 11/11/15  1022     History   Chief Complaint Chief Complaint  Patient presents with  . Neck Pain    HPI Denise Cline is a 75 y.o. female.  The pain started a couple of days ago on the left side.  It is on the posterior part of the neck.  She has a history of a prior blood clot in her leg.   She is not longer on medications.  Pt is concerned that she could have another blood clot.   The history is provided by the patient.  Neck Pain   This is a new problem. The current episode started 2 days ago. The problem occurs constantly. Progression since onset: The pain was worse this am when she went to get up.   The pain is associated with nothing. There has been no fever. The quality of the pain is described as stabbing and shooting. Radiates to: back of her head. The pain is moderate. Exacerbated by: movement and turning. Stiffness is present in the morning. Pertinent negatives include no photophobia, no visual change, no numbness, no weight loss, no headaches, no paresis, no tingling and no weakness. She has tried NSAIDs for the symptoms. The treatment provided mild relief.    Past Medical History:  Diagnosis Date  . Anxiety   . COPD (chronic obstructive pulmonary disease) ([place])   . Fibroid   . History of shingles   . Hypertension   . Thyroid disease    Nodules  . Varicose veins     Patient Active Problem List   Diagnosis Date Noted  . Coronary artery calcification seen on CAT scan 05/12/2015  . Thoracic aortic aneurysm ([place]) 05/12/2015  . Dyspnea 03/17/2015  . Left leg swelling 03/16/2015  . Acute colitis 10/04/2014  . COPD (chronic obstructive pulmonary disease) ([place])   . PVD (peripheral vascular disease) ([place]) 04/01/2012  . Leg pain 04/01/2012  . Swelling of limb 04/01/2012  . History of shingles   . Hypertension   . Fibroid   . Thyroid disease   . Anxiety     Past Surgical History:    Procedure Laterality Date  . ABDOMINAL HYSTERECTOMY    . COLONOSCOPY N/A 10/05/2014   Procedure: COLONOSCOPY;  Surgeon: Ronald Lobo, MD;  Location: [facility] ENDOSCOPY;  Service: Endoscopy;  Laterality: N/A;  . OOPHORECTOMY     One ovary removed  . Thyroid nodules    . THYROID SURGERY      OB History    Gravida Para Term Preterm AB Living   3 3 3     3    SAB TAB Ectopic Multiple Live Births                   Home Medications    Prior to Admission medications   Medication Sig Start Date End Date Taking? Authorizing Provider  aspirin EC 81 MG tablet Take 81 mg by mouth daily.   Yes Historical Provider, MD  buPROPion (WELLBUTRIN SR) 150 MG 12 hr tablet Take 150 mg by mouth daily.   Yes Historical Provider, MD  fluticasone furoate-vilanterol (BREO ELLIPTA) 100-25 MCG/INH AEPB Inhale 1 puff into the lungs daily as needed (shortness of breath).   Yes Historical Provider, MD  ibuprofen (ADVIL,MOTRIN) 200 MG tablet Take 2 tablets (400 mg total) by mouth 2 (two) times daily. 08/23/15  Yes Merrily Pew, MD  Multiple Vitamins-Minerals (CENTRUM SILVER ADULT 50+) TABS  Take 1 tablet by mouth daily.   Yes Historical Provider, MD  OVER THE COUNTER MEDICATION Take 1 capsule by mouth daily as needed (constipation.). Med Name: COLON CLENZ    Yes Historical Provider, MD  traMADol (ULTRAM) 50 MG tablet Take 1 tablet (50 mg total) by mouth every 6 (six) hours as needed for moderate pain. 08/23/15  Yes Merrily Pew, MD  valsartan-hydrochlorothiazide (DIOVAN-HCT) 160-25 MG per tablet Take 1 tablet by mouth daily.   Yes Historical Provider, MD    Family History Family History  Problem Relation Age of Onset  . Crohn's disease Mother   . Stroke Father   . Diabetes Sister   . Hypertension Sister   . Hypertension Brother     Social History Social History  Substance Use Topics  . Smoking status: Former Smoker    Packs/day: 2.00    Years: 40.00    Types: Cigarettes    Quit date: 05/30/2008  . Smokeless  tobacco: Former Systems developer    Quit date: 05/12/2008     Comment: Counseled to remain smoke free  . Alcohol use No     Comment: Rare     Allergies   Penicillins; Sulfa antibiotics; and Oxycodone   Review of Systems Review of Systems  Constitutional: Negative for weight loss.  Eyes: Negative for photophobia.  Musculoskeletal: Positive for neck pain.  Neurological: Negative for tingling, weakness, numbness and headaches.  All other systems reviewed and are negative.    Physical Exam Updated Vital Signs BP 152/91   Pulse (!) 58   Temp 98 F (36.7 C) (Oral)   Resp 18   Ht 5\' 5"  (1.651 m)   Wt 90.7 kg   SpO2 100%   BMI 33.28 kg/m   Physical Exam  Constitutional: She appears well-developed and well-nourished. No distress.  HENT:  Head: Normocephalic and atraumatic.  Right Ear: External ear normal.  Left Ear: External ear normal.  Eyes: Conjunctivae are normal. Right eye exhibits no discharge. Left eye exhibits no discharge. No scleral icterus.  Neck: Neck supple. No spinous process tenderness and no muscular tenderness present. No neck rigidity. Decreased range of motion present. No tracheal deviation present.  Cardiovascular: Normal rate, regular rhythm and intact distal pulses.   Pulmonary/Chest: Effort normal and breath sounds normal. No stridor. No respiratory distress. She has no wheezes. She has no rales.  Abdominal: Soft. Bowel sounds are normal. She exhibits no distension. There is no tenderness. There is no rebound and no guarding.  Musculoskeletal: She exhibits no edema or tenderness.  Neurological: She is alert. She has normal strength. No cranial nerve deficit (no facial droop, extraocular movements intact, no slurred speech) or sensory deficit. She exhibits normal muscle tone. She displays no seizure activity. Coordination normal.  Skin: Skin is warm and dry. No rash noted.  Psychiatric: She has a normal mood and affect.  Nursing note and vitals reviewed.    ED  Treatments / Results  Labs (all labs ordered are listed, but only abnormal results are displayed) Labs Reviewed  BASIC METABOLIC PANEL - Abnormal; Notable for the following:       Result Value   BUN 24 (*)    All other components within normal limits  CBC  TROPONIN I  PROTIME-INR    EKG  EKG Interpretation None       Radiology Ct Angio Head W Or Wo Contrast  Result Date: 11/11/2015 CLINICAL DATA:  75 y/o F; left-sided headache radiating to the occipital area and 2  days of left-sided neck pain worse this morning. EXAM: CT ANGIOGRAPHY HEAD AND NECK TECHNIQUE: Multidetector CT imaging of the head and neck was performed using the standard protocol during bolus administration of intravenous contrast. Multiplanar CT image reconstructions and MIPs were obtained to evaluate the vascular anatomy. Carotid stenosis measurements (when applicable) are obtained utilizing NASCET criteria, using the distal internal carotid diameter as the denominator. CONTRAST:  100 cc Isovue 370. COMPARISON:  06/30/2013 CT head. FINDINGS: CT HEAD Brain: No evidence of large acute infarct, focal mass effect, intracranial hemorrhage, or hydrocephalus. Mild parenchymal volume loss and chronic microvascular ischemic changes. Calvarium and skull base: Negative. Paranasal sinuses: No acute finding. Orbits: Negative. CTA NECK Aortic arch: Ascending aortic aneurysm measuring up to 4.4 cm. Three vessel arch. Right carotid system: Mild calcific atherosclerosis of the right carotid bifurcation without significant stenosis. Moderate tortuosity of the right internal carotid artery with a brief retropharyngeal submucosal course. Left carotid system: Minimal calcific atherosclerosis of the left carotid bifurcation. No significant stenosis. Vertebral arteries:No occlusion, aneurysm, dissection, or significant stenosis is identified. Skeleton: Multilevel degenerative changes of the cervical spine with disc space narrowing, marginal  osteophytes, and disc osteophyte complexes greatest at the C4-5 level. Where there is at least moderate canal stenosis. Multilevel uncovertebral and facet hypertrophy results in mild to moderate bony foraminal narrowing bilaterally. 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 Heterogeneity of the right thyroid lobe may represent underlying nodules. No lymphadenopathy or discrete cervical mass is identified. Borderline nonspecific left-sided subpectoral lymph node. 2 mm right parotid gland calcification may represent sequelae of prior infectious/inflammatory process or a sialolith(series 7, image 173). No evidence of salivary duct obstruction. Upper chest: Left-sided lobar and segmental pulmonary embolus (series 7, image 21). Minor atelectasis in the lung apices bilaterally and minimal emphysematous changes. CTA HEAD Anterior circulation: Bilateral internal carotid arteries, middle cerebral arteries, and anterior cerebral arteries are patent. No occlusion, aneurysm, dissection, or significant stenosis is identified. Posterior circulation: Codominant vertebrobasilar system. Bilateral vertebral arteries, the basilar artery, and posterior cerebral arteries are patent. No occlusion, aneurysm, dissection, or significant stenosis is identified. Venous sinuses: No thrombus identified. Anatomic variants: Bilateral fetal posterior cerebral arteries. Patent anterior communicating artery. Delayed phase: No abnormal enhancement. IMPRESSION: 1. Left-sided lobar and segmental acute pulmonary embolus. 2. No occlusion, aneurysm, dissection, or significant stenosis of the carotid or vertebral arteries of the neck. 3. No occlusion, aneurysm, dissection, or significant stenosis of the circle of Willis. 4. No acute intracranial abnormality or abnormal enhancement. Stable mild parenchymal volume loss and chronic microvascular ischemic changes. 5. Ascending aortic aneurysm measuring 4.4 cm. Recommend annual imaging followup by  CTA or MRA. This recommendation follows 2010 ACCF/AHA/AATS/ACR/ASA/SCA/SCAI/SIR/STS/SVM Guidelines for the Diagnosis and Management of Patients with Thoracic Aortic Disease. Circulation. 2010; 121SP:1689793 These results were called by telephone at the time of interpretation on 11/11/2015 at 2:49 pm to Dr. Dorie Rank , who verbally acknowledged these results. Electronically Signed   By: Kristine Garbe M.D.   On: 11/11/2015 14:49   Ct Angio Neck W And/or Wo Contrast  Result Date: 11/11/2015 CLINICAL DATA:  75 y/o F; left-sided headache radiating to the occipital area and 2 days of left-sided neck pain worse this morning. EXAM: CT ANGIOGRAPHY HEAD AND NECK TECHNIQUE: Multidetector CT imaging of the head and neck was performed using the standard protocol during bolus administration of intravenous contrast. Multiplanar CT image reconstructions and MIPs were obtained to evaluate the vascular anatomy. Carotid stenosis measurements (when applicable) are obtained utilizing  NASCET criteria, using the distal internal carotid diameter as the denominator. CONTRAST:  100 cc Isovue 370. COMPARISON:  06/30/2013 CT head. FINDINGS: CT HEAD Brain: No evidence of large acute infarct, focal mass effect, intracranial hemorrhage, or hydrocephalus. Mild parenchymal volume loss and chronic microvascular ischemic changes. Calvarium and skull base: Negative. Paranasal sinuses: No acute finding. Orbits: Negative. CTA NECK Aortic arch: Ascending aortic aneurysm measuring up to 4.4 cm. Three vessel arch. Right carotid system: Mild calcific atherosclerosis of the right carotid bifurcation without significant stenosis. Moderate tortuosity of the right internal carotid artery with a brief retropharyngeal submucosal course. Left carotid system: Minimal calcific atherosclerosis of the left carotid bifurcation. No significant stenosis. Vertebral arteries:No occlusion, aneurysm, dissection, or significant stenosis is identified. Skeleton:  Multilevel degenerative changes of the cervical spine with disc space narrowing, marginal osteophytes, and disc osteophyte complexes greatest at the C4-5 level. Where there is at least moderate canal stenosis. Multilevel uncovertebral and facet hypertrophy results in mild to moderate bony foraminal narrowing bilaterally. 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 Heterogeneity of the right thyroid lobe may represent underlying nodules. No lymphadenopathy or discrete cervical mass is identified. Borderline nonspecific left-sided subpectoral lymph node. 2 mm right parotid gland calcification may represent sequelae of prior infectious/inflammatory process or a sialolith(series 7, image 173). No evidence of salivary duct obstruction. Upper chest: Left-sided lobar and segmental pulmonary embolus (series 7, image 21). Minor atelectasis in the lung apices bilaterally and minimal emphysematous changes. CTA HEAD Anterior circulation: Bilateral internal carotid arteries, middle cerebral arteries, and anterior cerebral arteries are patent. No occlusion, aneurysm, dissection, or significant stenosis is identified. Posterior circulation: Codominant vertebrobasilar system. Bilateral vertebral arteries, the basilar artery, and posterior cerebral arteries are patent. No occlusion, aneurysm, dissection, or significant stenosis is identified. Venous sinuses: No thrombus identified. Anatomic variants: Bilateral fetal posterior cerebral arteries. Patent anterior communicating artery. Delayed phase: No abnormal enhancement. IMPRESSION: 1. Left-sided lobar and segmental acute pulmonary embolus. 2. No occlusion, aneurysm, dissection, or significant stenosis of the carotid or vertebral arteries of the neck. 3. No occlusion, aneurysm, dissection, or significant stenosis of the circle of Willis. 4. No acute intracranial abnormality or abnormal enhancement. Stable mild parenchymal volume loss and chronic microvascular ischemic  changes. 5. Ascending aortic aneurysm measuring 4.4 cm. Recommend annual imaging followup by CTA or MRA. This recommendation follows 2010 ACCF/AHA/AATS/ACR/ASA/SCA/SCAI/SIR/STS/SVM Guidelines for the Diagnosis and Management of Patients with Thoracic Aortic Disease. Circulation. 2010; [id number] These results were called by telephone at the time of interpretation on 11/11/2015 at 2:49 pm to Dr. Dorie Rank , who verbally acknowledged these results. Electronically Signed   By: Kristine Garbe M.D.   On: 11/11/2015 14:49    Procedures Procedures (including critical care time)  Medications Ordered in ED Medications  heparin bolus via infusion 2,000 Units (not administered)  heparin ADULT infusion 100 units/mL (25000 units/266mL sodium chloride 0.45%) (not administered)  morphine 2 MG/ML injection 2 mg (2 mg Intravenous Given 11/11/15 1330)  iopamidol (ISOVUE-370) 76 % injection 100 mL (100 mLs Intravenous Contrast Given 11/11/15 1357)     Initial Impression / Assessment and Plan / ED Course  I have reviewed the triage vital signs and the nursing notes.  Pertinent labs & imaging results that were available during my care of the patient were reviewed by me and considered in my medical decision making (see chart for details).  Clinical Course    The patient's CT scan surprisingly shows a pulmonary embolus on the left side. This  may be causing referred pain to her neck. Interestingly, the patient denies any trouble with chest pain or shortness of breath.  CT scan also demonstrates a thoracic aneurysm but this is stable and noted on prior CT scans.  Patient does have history of prior blood clots. She was on Xarelto but stopped taking it. I have started her on heparin. I will consult the medical service for admission. I have added on troponin and a regular chest x-ray.  Final Clinical Impressions(s) / ED Diagnoses   Final diagnoses:  Other acute pulmonary embolism [facility])    New  Prescriptions New Prescriptions   No medications on file     Dorie Rank, MD 11/11/15 1529

## 2015-11-11 NOTE — H&P (Signed)
History and Physical    Denise Cline [id number] DOB: [birth year] DOA: 11/11/2015  PCP: Henrine Screws, MD  Outpatient Specialists: Cardiology, Dr. Tamala Julian, pulmonology, Dr. Melvyn Novas Patient coming from: home   Chief Complaint: Neck pain  HPI: Denise Cline is a 75 y.o. female with medical history significant of COPD, hypertension, peripheral vascular disease, presents to the emergency room with a chief complaint of neck pain. Patient complains over the last couple days she's been having left-sided neck pain, and at the base of her skull. She complains of the pain is getting worse when she moves her head around. She denies any fever or chills. She denies any nausea or vomiting. She denies any photophobia. She denies any numbness in her arms. She has no fever or chills. She has no chest pain, no reference of breath. No abdominal pain, nausea, vomiting or diarrhea, and is otherwise feeling his baseline. 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 She finished treatment. She had an admission in August 2016 with a lower GI bleed felt to be due to ischemic colitis. No bleeding since.  ED Course: In the ED, patient vital signs are stable, blood work is essentially unremarkable. Out of concern for vascular etiology for her neck pain, she underwent a CT angiogram of the head and neck which showed no pathology in that area however he did show acute pulmonary embolism in the left sided lobar and segmental areas. She was started on heparin infusion and [facility] was asked for admission.  Review of Systems: As per HPI otherwise 10 point review of systems negative.   Past Medical History:  Diagnosis Date  . Anxiety   . COPD (chronic obstructive pulmonary disease) ([place])   . Fibroid   . History of shingles   . Hypertension   . Thyroid disease    Nodules  . Varicose veins     Past Surgical History:  Procedure Laterality Date  . ABDOMINAL  HYSTERECTOMY    . COLONOSCOPY N/A 10/05/2014   Procedure: COLONOSCOPY;  Surgeon: Ronald Lobo, MD;  Location: [facility];  Service: Endoscopy;  Laterality: N/A;  . OOPHORECTOMY     One ovary removed  . Thyroid nodules    . THYROID SURGERY       reports that she quit smoking about 7 years ago. Her smoking use included Cigarettes. She has a 80.00 pack-year smoking history. She quit smokeless tobacco use about 7 years ago. She reports that she does not drink alcohol or use drugs.  Allergies  Allergen Reactions  . Penicillins Anaphylaxis    Has patient had a PCN reaction causing immediate rash, facial/tongue/throat swelling, SOB or lightheadedness with hypotension: yes Has patient had a PCN reaction causing severe rash involving mucus membranes or skin necrosis: no Has patient had a PCN reaction that required hospitalization yes Has patient had a PCN reaction occurring within the last 10 years: yes If all of the above answers are "NO", then may proceed with Cephalosporin use.   . Sulfa Antibiotics Hives  . Oxycodone Other (See Comments)    hallucanations     Family History  Problem Relation Age of Onset  . Crohn's disease Mother   . Stroke Father   . Diabetes Sister   . Hypertension Sister   . Hypertension Brother     Prior to Admission medications   Medication Sig Start Date End Date Taking? Authorizing Provider  aspirin EC 81 MG  tablet Take 81 mg by mouth daily.   Yes Historical Provider, MD  buPROPion (WELLBUTRIN SR) 150 MG 12 hr tablet Take 150 mg by mouth daily.   Yes Historical Provider, MD  fluticasone furoate-vilanterol (BREO ELLIPTA) 100-25 MCG/INH AEPB Inhale 1 puff into the lungs daily as needed (shortness of breath).   Yes Historical Provider, MD  ibuprofen (ADVIL,MOTRIN) 200 MG tablet Take 2 tablets (400 mg total) by mouth 2 (two) times daily. 08/23/15  Yes Merrily Pew, MD  Multiple Vitamins-Minerals (CENTRUM SILVER ADULT 50+) TABS Take 1 tablet by mouth daily.    Yes Historical Provider, MD  OVER THE COUNTER MEDICATION Take 1 capsule by mouth daily as needed (constipation.). Med Name: COLON CLENZ    Yes Historical Provider, MD  traMADol (ULTRAM) 50 MG tablet Take 1 tablet (50 mg total) by mouth every 6 (six) hours as needed for moderate pain. 08/23/15  Yes Merrily Pew, MD  valsartan-hydrochlorothiazide (DIOVAN-HCT) 160-25 MG per tablet Take 1 tablet by mouth daily.   Yes Historical Provider, MD    Physical Exam: Vitals:   11/11/15 1047 11/11/15 1330 11/11/15 1550 11/11/15 1550  BP:  152/91 157/96 157/96  Pulse:  (!) 58 (!) 56 (!) 58  Resp:  18 18 12   Temp:      TempSrc:      SpO2:  100% 100% 100%  Weight: 90.7 kg (200 lb)     Height: 5\' 5"  (1.651 m)         Constitutional: NAD, calm, comfortable Vitals:   11/11/15 1047 11/11/15 1330 11/11/15 1550 11/11/15 1550  BP:  152/91 157/96 157/96  Pulse:  (!) 58 (!) 56 (!) 58  Resp:  18 18 12   Temp:      TempSrc:      SpO2:  100% 100% 100%  Weight: 90.7 kg (200 lb)     Height: 5\' 5"  (1.651 m)      Eyes: PERRL, lids and conjunctivae normal ENMT: Mucous membranes are moist. Posterior pharynx clear of any exudate or lesions.Normal dentition.  Neck: normal, supple, no masses, no thyromegaly Respiratory: clear to auscultation bilaterally, no wheezing, no crackles. Normal respiratory effort. No accessory muscle use.  Cardiovascular: Regular rate and rhythm, no murmurs / rubs / gallops. No extremity edema. 2+ pedal pulses.  Abdomen: no tenderness, no masses palpated. Bowel sounds positive.  Musculoskeletal: no clubbing / cyanosis. Normal muscle tone. Slightly asymmetrically larger left lower extremity than right Skin: no rashes, lesions, ulcers. No induration Neurologic: CN 2-12 grossly intact. Strength 5/5 in all 4.  Psychiatric: Normal judgment and insight. Alert and oriented x 3. Normal mood.   Labs on Admission: I have personally reviewed following labs and imaging studies  CBC:  Recent  Labs Lab 11/11/15 1301  WBC 6.4  HGB 12.7  HCT 37.1  MCV 93.7  PLT 0000000   Basic Metabolic Panel:  Recent Labs Lab 11/11/15 1301  NA 141  K 4.0  CL 105  CO2 29  GLUCOSE 93  BUN 24*  CREATININE 0.85  CALCIUM 9.0   GFR: Estimated Creatinine Clearance: 61.7 mL/min (by C-G formula based on SCr of 0.85 mg/dL). Liver Function Tests: No results for input(s): AST, ALT, ALKPHOS, BILITOT, PROT, ALBUMIN in the last 168 hours. No results for input(s): LIPASE, AMYLASE in the last 168 hours. No results for input(s): AMMONIA in the last 168 hours. Coagulation Profile:  Recent Labs Lab 11/11/15 1301  INR 0.96   Cardiac Enzymes:  Recent Labs Lab 11/11/15 1301  TROPONINI 0.03*   BNP (last 3 results) No results for input(s): PROBNP in the last 8760 hours. HbA1C: No results for input(s): HGBA1C in the last 72 hours. CBG: No results for input(s): GLUCAP in the last 168 hours. Lipid Profile: No results for input(s): CHOL, HDL, LDLCALC, TRIG, CHOLHDL, LDLDIRECT in the last 72 hours. Thyroid Function Tests: No results for input(s): TSH, T4TOTAL, FREET4, T3FREE, THYROIDAB in the last 72 hours. Anemia Panel: No results for input(s): VITAMINB12, FOLATE, FERRITIN, TIBC, IRON, RETICCTPCT in the last 72 hours. Urine analysis:    Component Value Date/Time   COLORURINE YELLOW 10/04/2014 1415   APPEARANCEUR CLOUDY (A) 10/04/2014 1415   LABSPEC 1.018 10/04/2014 1415   PHURINE 6.0 10/04/2014 1415   GLUCOSEU NEGATIVE 10/04/2014 1415   HGBUR NEGATIVE 10/04/2014 1415   BILIRUBINUR NEGATIVE 10/04/2014 1415   KETONESUR NEGATIVE 10/04/2014 1415   PROTEINUR NEGATIVE 10/04/2014 1415   UROBILINOGEN 0.2 10/04/2014 1415   NITRITE POSITIVE (A) 10/04/2014 1415   LEUKOCYTESUR MODERATE (A) 10/04/2014 1415   Sepsis Labs: @LABRCNTIP (procalcitonin:4,lacticidven:4) )No results found for this or any previous visit (from the past 240 hour(s)).   Radiological Exams on Admission: Ct Angio Head W Or  Wo Contrast  Result Date: 11/11/2015 CLINICAL DATA:  75 y/o F; left-sided headache radiating to the occipital area and 2 days of left-sided neck pain worse this morning. EXAM: CT ANGIOGRAPHY HEAD AND NECK TECHNIQUE: Multidetector CT imaging of the head and neck was performed using the standard protocol during bolus administration of intravenous contrast. Multiplanar CT image reconstructions and MIPs were obtained to evaluate the vascular anatomy. Carotid stenosis measurements (when applicable) are obtained utilizing NASCET criteria, using the distal internal carotid diameter as the denominator. CONTRAST:  100 cc Isovue 370. COMPARISON:  06/30/2013 CT head. FINDINGS: CT HEAD Brain: No evidence of large acute infarct, focal mass effect, intracranial hemorrhage, or hydrocephalus. Mild parenchymal volume loss and chronic microvascular ischemic changes. Calvarium and skull base: Negative. Paranasal sinuses: No acute finding. Orbits: Negative. CTA NECK Aortic arch: Ascending aortic aneurysm measuring up to 4.4 cm. Three vessel arch. Right carotid system: Mild calcific atherosclerosis of the right carotid bifurcation without significant stenosis. Moderate tortuosity of the right internal carotid artery with a brief retropharyngeal submucosal course. Left carotid system: Minimal calcific atherosclerosis of the left carotid bifurcation. No significant stenosis. Vertebral arteries:No occlusion, aneurysm, dissection, or significant stenosis is identified. Skeleton: Multilevel degenerative changes of the cervical spine with disc space narrowing, marginal osteophytes, and disc osteophyte complexes greatest at the C4-5 level. Where there is at least moderate canal stenosis. Multilevel uncovertebral and facet hypertrophy results in mild to moderate bony foraminal narrowing bilaterally. 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 Heterogeneity of the right thyroid lobe may represent underlying nodules. No  lymphadenopathy or discrete cervical mass is identified. Borderline nonspecific left-sided subpectoral lymph node. 2 mm right parotid gland calcification may represent sequelae of prior infectious/inflammatory process or a sialolith(series 7, image 173). No evidence of salivary duct obstruction. Upper chest: Left-sided lobar and segmental pulmonary embolus (series 7, image 21). Minor atelectasis in the lung apices bilaterally and minimal emphysematous changes. CTA HEAD Anterior circulation: Bilateral internal carotid arteries, middle cerebral arteries, and anterior cerebral arteries are patent. No occlusion, aneurysm, dissection, or significant stenosis is identified. Posterior circulation: Codominant vertebrobasilar system. Bilateral vertebral arteries, the basilar artery, and posterior cerebral arteries are patent. No occlusion, aneurysm, dissection, or significant stenosis is identified. Venous sinuses: No thrombus identified. Anatomic variants: Bilateral fetal posterior cerebral arteries. Patent  anterior communicating artery. Delayed phase: No abnormal enhancement. IMPRESSION: 1. Left-sided lobar and segmental acute pulmonary embolus. 2. No occlusion, aneurysm, dissection, or significant stenosis of the carotid or vertebral arteries of the neck. 3. No occlusion, aneurysm, dissection, or significant stenosis of the circle of Willis. 4. No acute intracranial abnormality or abnormal enhancement. Stable mild parenchymal volume loss and chronic microvascular ischemic changes. 5. Ascending aortic aneurysm measuring 4.4 cm. Recommend annual imaging followup by CTA or MRA. This recommendation follows 2010 ACCF/AHA/AATS/ACR/ASA/SCA/SCAI/SIR/STS/SVM Guidelines for the Diagnosis and Management of Patients with Thoracic Aortic Disease. Circulation. 2010; [id number] These results were called by telephone at the time of interpretation on 11/11/2015 at 2:49 pm to Dr. Dorie Rank , who verbally acknowledged these results.  Electronically Signed   By: Kristine Garbe M.D.   On: 11/11/2015 14:49   Dg Chest 2 View  Result Date: 11/11/2015 CLINICAL DATA:  Cough.  Shortness of breath. EXAM: CHEST  2 VIEW COMPARISON:  CT 02/23/2015 .  06/30/2013. FINDINGS: Cardiomegaly with mild pulmonary vascular prominence. No focal alveolar infiltrate. Mild bibasilar subsegmental atelectasis and/or scarring. No pleural effusion or pneumothorax P IMPRESSION: Cardiomegaly with mild pulmonary venous congestion. Low lung volumes with mild bibasilar subsegmental atelectasis and/or scarring again noted. These findings stable from prior exam. Electronically Signed   By: Marcello Moores  Register   On: 11/11/2015 15:31   Ct Angio Neck W And/or Wo Contrast  Result Date: 11/11/2015 CLINICAL DATA:  74 y/o F; left-sided headache radiating to the occipital area and 2 days of left-sided neck pain worse this morning. EXAM: CT ANGIOGRAPHY HEAD AND NECK TECHNIQUE: Multidetector CT imaging of the head and neck was performed using the standard protocol during bolus administration of intravenous contrast. Multiplanar CT image reconstructions and MIPs were obtained to evaluate the vascular anatomy. Carotid stenosis measurements (when applicable) are obtained utilizing NASCET criteria, using the distal internal carotid diameter as the denominator. CONTRAST:  100 cc Isovue 370. COMPARISON:  06/30/2013 CT head. FINDINGS: CT HEAD Brain: No evidence of large acute infarct, focal mass effect, intracranial hemorrhage, or hydrocephalus. Mild parenchymal volume loss and chronic microvascular ischemic changes. Calvarium and skull base: Negative. Paranasal sinuses: No acute finding. Orbits: Negative. CTA NECK Aortic arch: Ascending aortic aneurysm measuring up to 4.4 cm. Three vessel arch. Right carotid system: Mild calcific atherosclerosis of the right carotid bifurcation without significant stenosis. Moderate tortuosity of the right internal carotid artery with a brief  retropharyngeal submucosal course. Left carotid system: Minimal calcific atherosclerosis of the left carotid bifurcation. No significant stenosis. Vertebral arteries:No occlusion, aneurysm, dissection, or significant stenosis is identified. Skeleton: Multilevel degenerative changes of the cervical spine with disc space narrowing, marginal osteophytes, and disc osteophyte complexes greatest at the C4-5 level. Where there is at least moderate canal stenosis. Multilevel uncovertebral and facet hypertrophy results in mild to moderate bony foraminal narrowing bilaterally. 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 Heterogeneity of the right thyroid lobe may represent underlying nodules. No lymphadenopathy or discrete cervical mass is identified. Borderline nonspecific left-sided subpectoral lymph node. 2 mm right parotid gland calcification may represent sequelae of prior infectious/inflammatory process or a sialolith(series 7, image 173). No evidence of salivary duct obstruction. Upper chest: Left-sided lobar and segmental pulmonary embolus (series 7, image 21). Minor atelectasis in the lung apices bilaterally and minimal emphysematous changes. CTA HEAD Anterior circulation: Bilateral internal carotid arteries, middle cerebral arteries, and anterior cerebral arteries are patent. No occlusion, aneurysm, dissection, or significant stenosis is identified. Posterior circulation: Codominant vertebrobasilar  system. Bilateral vertebral arteries, the basilar artery, and posterior cerebral arteries are patent. No occlusion, aneurysm, dissection, or significant stenosis is identified. Venous sinuses: No thrombus identified. Anatomic variants: Bilateral fetal posterior cerebral arteries. Patent anterior communicating artery. Delayed phase: No abnormal enhancement. IMPRESSION: 1. Left-sided lobar and segmental acute pulmonary embolus. 2. No occlusion, aneurysm, dissection, or significant stenosis of the carotid or  vertebral arteries of the neck. 3. No occlusion, aneurysm, dissection, or significant stenosis of the circle of Willis. 4. No acute intracranial abnormality or abnormal enhancement. Stable mild parenchymal volume loss and chronic microvascular ischemic changes. 5. Ascending aortic aneurysm measuring 4.4 cm. Recommend annual imaging followup by CTA or MRA. This recommendation follows 2010 ACCF/AHA/AATS/ACR/ASA/SCA/SCAI/SIR/STS/SVM Guidelines for the Diagnosis and Management of Patients with Thoracic Aortic Disease. Circulation. 2010; [id number] These results were called by telephone at the time of interpretation on 11/11/2015 at 2:49 pm to Dr. Dorie Rank , who verbally acknowledged these results. Electronically Signed   By: Kristine Garbe M.D.   On: 11/11/2015 14:49    EKG: Independently reviewed. Sinus rhythm  Assessment/Plan Active Problems:   Hypertension   PVD (peripheral vascular disease) ([facility])   COPD (chronic obstructive pulmonary disease) ([facility])   Left leg swelling   Coronary artery calcification seen on CAT scan   Pulmonary emboli ([facility])   Acute pulmonary embolism ([facility])   Acute pulmonary embolism - Very atypical presentation, I doubt that her neck pain is referred from the pulmonary embolism since the neck pain is somewhat reproducible with palpation and worse with movement. I think the PE is incidental, however does appear acute. She has lower extremity swelling, we'll do DVT Doppler lower extremities to rule out DVT. - Place patient on heparin, she can probably be transitioned on Xarelto soon, closely monitor for GI bleeding as she had an episode of that in August 2016.  COPD - Stable, no wheezing, resume home medications  Hypertension - Resume home medications  Peripheral vascular disease    DVT prophylaxis: heparin   Code Status: Full  Family Communication: no family bedside Disposition Plan: admit to telemetry Consults called: none  Admission status: obs     Marzetta Board, MD [facility] Pager [phone]  If 7PM-7AM, please contact night-coverage [email or web address] Password [email or web address]  11/11/2015, 4:46 PM

## 2015-11-11 NOTE — ED Notes (Signed)
Pt can go to floor at 16:30.

## 2015-11-11 NOTE — ED Notes (Signed)
Hospitalist at bedside.  Will transport pt to room after MD leaves.

## 2015-11-12 ENCOUNTER — Observation Stay (HOSPITAL_COMMUNITY): Payer: Medicare Other

## 2015-11-12 DIAGNOSIS — I2699 Other pulmonary embolism without acute cor pulmonale: Secondary | ICD-10-CM | POA: Diagnosis not present

## 2015-11-12 DIAGNOSIS — J449 Chronic obstructive pulmonary disease, unspecified: Secondary | ICD-10-CM | POA: Diagnosis present

## 2015-11-12 DIAGNOSIS — M7989 Other specified soft tissue disorders: Secondary | ICD-10-CM | POA: Diagnosis not present

## 2015-11-12 DIAGNOSIS — I739 Peripheral vascular disease, unspecified: Secondary | ICD-10-CM | POA: Diagnosis present

## 2015-11-12 DIAGNOSIS — Z79899 Other long term (current) drug therapy: Secondary | ICD-10-CM | POA: Diagnosis not present

## 2015-11-12 DIAGNOSIS — I10 Essential (primary) hypertension: Secondary | ICD-10-CM

## 2015-11-12 DIAGNOSIS — I2609 Other pulmonary embolism with acute cor pulmonale: Secondary | ICD-10-CM | POA: Diagnosis present

## 2015-11-12 DIAGNOSIS — I714 Abdominal aortic aneurysm, without rupture: Secondary | ICD-10-CM | POA: Diagnosis present

## 2015-11-12 DIAGNOSIS — I82402 Acute embolism and thrombosis of unspecified deep veins of left lower extremity: Secondary | ICD-10-CM | POA: Diagnosis not present

## 2015-11-12 DIAGNOSIS — R609 Edema, unspecified: Secondary | ICD-10-CM

## 2015-11-12 DIAGNOSIS — Z87891 Personal history of nicotine dependence: Secondary | ICD-10-CM | POA: Diagnosis not present

## 2015-11-12 DIAGNOSIS — Z880 Allergy status to penicillin: Secondary | ICD-10-CM | POA: Diagnosis not present

## 2015-11-12 DIAGNOSIS — I82412 Acute embolism and thrombosis of left femoral vein: Secondary | ICD-10-CM | POA: Diagnosis not present

## 2015-11-12 DIAGNOSIS — I82442 Acute embolism and thrombosis of left tibial vein: Secondary | ICD-10-CM | POA: Diagnosis present

## 2015-11-12 DIAGNOSIS — J42 Unspecified chronic bronchitis: Secondary | ICD-10-CM | POA: Diagnosis not present

## 2015-11-12 DIAGNOSIS — Z86718 Personal history of other venous thrombosis and embolism: Secondary | ICD-10-CM | POA: Diagnosis not present

## 2015-11-12 DIAGNOSIS — Z7982 Long term (current) use of aspirin: Secondary | ICD-10-CM | POA: Diagnosis not present

## 2015-11-12 DIAGNOSIS — I82409 Acute embolism and thrombosis of unspecified deep veins of unspecified lower extremity: Secondary | ICD-10-CM

## 2015-11-12 LAB — CBC
HCT: 35.3 % — ABNORMAL LOW (ref 36.0–46.0)
Hemoglobin: 11.7 g/dL — ABNORMAL LOW (ref 12.0–15.0)
MCH: 31.5 pg (ref 26.0–34.0)
MCHC: 33.1 g/dL (ref 30.0–36.0)
MCV: 94.9 fL (ref 78.0–100.0)
PLATELETS: 203 10*3/uL (ref 150–400)
RBC: 3.72 MIL/uL — AB (ref 3.87–5.11)
RDW: 14.7 % (ref 11.5–15.5)
WBC: 7.1 10*3/uL (ref 4.0–10.5)

## 2015-11-12 LAB — BASIC METABOLIC PANEL
ANION GAP: 8 (ref 5–15)
BUN: 19 mg/dL (ref 6–20)
CO2: 27 mmol/L (ref 22–32)
Calcium: 8.8 mg/dL — ABNORMAL LOW (ref 8.9–10.3)
Chloride: 102 mmol/L (ref 101–111)
Creatinine, Ser: 0.87 mg/dL (ref 0.44–1.00)
GFR calc Af Amer: >60 mL/min (ref >60)
Glucose, Bld: 106 mg/dL — ABNORMAL HIGH (ref 65–99)
POTASSIUM: 3.9 mmol/L (ref 3.5–5.1)
SODIUM: 137 mmol/L (ref 135–145)

## 2015-11-12 LAB — HEPARIN LEVEL (UNFRACTIONATED)
HEPARIN UNFRACTIONATED: 0.72 [IU]/mL — AB (ref 0.30–0.70)
HEPARIN UNFRACTIONATED: 0.86 [IU]/mL — AB (ref 0.30–0.70)

## 2015-11-12 MED ORDER — HEPARIN (PORCINE) IN NACL 100-0.45 UNIT/ML-% IJ SOLN
950.0000 [IU]/h | INTRAMUSCULAR | Status: AC
  Filled 2015-11-12: qty 250

## 2015-11-12 MED ORDER — MUSCLE RUB 10-15 % EX CREA
1.0000 "application " | TOPICAL_CREAM | CUTANEOUS | Status: DC | PRN
  Administered 2015-11-13: 1 via TOPICAL
  Filled 2015-11-12: qty 85

## 2015-11-12 MED ORDER — HYDRALAZINE HCL 20 MG/ML IJ SOLN
10.0000 mg | Freq: Once | INTRAMUSCULAR | Status: AC
  Administered 2015-11-12: 10 mg via INTRAVENOUS
  Filled 2015-11-12: qty 1

## 2015-11-12 MED ORDER — METHOCARBAMOL 500 MG PO TABS
500.0000 mg | ORAL_TABLET | Freq: Four times a day (QID) | ORAL | Status: DC | PRN
  Administered 2015-11-12 (×2): 500 mg via ORAL
  Filled 2015-11-12 (×3): qty 1

## 2015-11-12 MED ORDER — HEPARIN (PORCINE) IN NACL 100-0.45 UNIT/ML-% IJ SOLN
1100.0000 [IU]/h | INTRAMUSCULAR | Status: DC
  Filled 2015-11-12: qty 250

## 2015-11-12 MED ORDER — HEPARIN (PORCINE) IN NACL 100-0.45 UNIT/ML-% IJ SOLN
1100.0000 [IU]/h | INTRAMUSCULAR | Status: DC
  Administered 2015-11-12: 1100 [IU]/h via INTRAVENOUS
  Filled 2015-11-12: qty 250

## 2015-11-12 MED ORDER — RIVAROXABAN 15 MG PO TABS: 15.0000 mg | ORAL_TABLET | Freq: Two times a day (BID) | ORAL | Status: DC

## 2015-11-12 MED ORDER — DOCUSATE SODIUM 100 MG PO CAPS
100.0000 mg | ORAL_CAPSULE | Freq: Two times a day (BID) | ORAL | Status: DC
Start: 2015-11-12 — End: 2015-11-14
  Administered 2015-11-12 – 2015-11-14 (×4): 100 mg via ORAL
  Filled 2015-11-12 (×4): qty 1

## 2015-11-12 NOTE — Progress Notes (Signed)
PHARMACY - HEPARIN (brief note)  Patient started on IV heparin for +PE  Heparin currently infusing @ 1200 units/hr Heparin level = 0.55 (Goal 0000000) No complications of therapy noted  Plan:  Continue IV heparin @ 1200 units/hr            F/U AM heparin level, CBC  Leone Haven, PharmD

## 2015-11-12 NOTE — Progress Notes (Signed)
ANTICOAGULATION CONSULT NOTE - Follow-Up  Pharmacy Consult for Heparin Indication: pulmonary embolus  Allergies  Allergen Reactions  . Penicillins Anaphylaxis    Has patient had a PCN reaction causing immediate rash, facial/tongue/throat swelling, SOB or lightheadedness with hypotension: yes Has patient had a PCN reaction causing severe rash involving mucus membranes or skin necrosis: no Has patient had a PCN reaction that required hospitalization yes Has patient had a PCN reaction occurring within the last 10 years: yes If all of the above answers are "NO", then may proceed with Cephalosporin use.   . Sulfa Antibiotics Hives  . Oxycodone Other (See Comments)    hallucanations    Patient Measurements: Height: 5\' 6"  (167.6 cm) Weight: 204 lb 2.3 oz (92.6 kg) IBW/kg (Calculated) : 59.3 Heparin Dosing Weight: 77kg  Vital Signs: Temp: 98.5 F (36.9 C) (09/09 1322) Temp Source: Oral (09/09 1322) BP: 118/64 (09/09 1322) Pulse Rate: 64 (09/09 1322)  Labs:  Recent Labs  11/11/15 1301 11/11/15 2203 11/12/15 0536 11/12/15 1651  HGB 12.7  --  11.7*  --   HCT 37.1  --  35.3*  --   PLT 188  --  203  --   LABPROT 12.8  --   --   --   INR 0.96  --   --   --   HEPARINUNFRC  --  0.55 0.72* 0.86*  CREATININE 0.85  --  0.87  --   TROPONINI 0.03*  --   --   --    Estimated Creatinine Clearance: 62.1 mL/min (by C-G formula based on SCr of 0.87 mg/dL).  Medications:   Infusions:  . heparin     Assessment: 72 yoF to ED with c/o head and neck pain. Head CT neg for acute process, R sided PE noted. + DVT.  No anti-coagulants PTA, hx of DVT in 2014, baseline CBC was WNL.  Today, 11/12/2015:  Heparin level elevated and trending up despite rate reduction this am. Heparin infusing at prescribed rate of 1100 units/hr.  Level drawn from L arm and heparin infusing in R arm  CBC: Hgb slightly decreased from admission, pltc WNL  No obvious bleeding  LE dopplers reveal possible mobile  clot so [facility] has d/c'd orders to change to xarelto and re-ordered heparin  Goal of Therapy:  Heparin level 0.3-0.7 units/ml Monitor platelets by anticoagulation protocol: Yes   Plan:   Reduce heparin level to 950 units/hr  Check heparin level in 8h  Daily CBC, Heparin level  Monitor for signs/sx bleeding  Await decision for oral anti-coagulation  Doreene Eland, PharmD, BCPS.   Pager: [phone] 11/12/2015 6:00 PM

## 2015-11-12 NOTE — Progress Notes (Signed)
[name], NP notified regarding pt's BP 174/108 and pt c/o headache. Will continue to monitor pt closely. Denise Cline

## 2015-11-12 NOTE — Progress Notes (Addendum)
*  Preliminary Results* Bilateral lower extremity venous duplex completed. Findings suggest acute deep vein thrombosis involving the left common femoral, femoral, and popliteal veins. Thrombus in the left femoral vein appears to be slightly mobile. 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 There is no obvious evidence of deep vein thrombosis involving the right lower extremity. There is no evidence of Baker's cyst bilaterally.  Preliminary results discussed with Dr. Eliseo Squires.  11/12/2015 9:32 AM Maudry Mayhew, BS, RVT, RDCS, RDMS

## 2015-11-12 NOTE — Progress Notes (Signed)
PROGRESS NOTE    Denise Cline  [id number] DOB: 10/11/1940 DOA: 11/11/2015 PCP: Denise Screws, MD   Outpatient Specialists:     Brief Narrative:  Denise Cline is a 75 y.o. female with medical history significant of COPD, hypertension, peripheral vascular disease, presents to the emergency room with a chief complaint of neck pain. Patient complains over the last couple days she's been having left-sided neck pain, and at the base of her skull. She complains of the pain is getting worse when she moves her head around. She denies any fever or chills. She denies any nausea or vomiting. She denies any photophobia. She denies any numbness in her arms. She has no fever or chills. She has no chest pain, no reference of breath. No abdominal pain, nausea, vomiting or diarrhea, and is otherwise feeling his baseline. 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 She finished treatment. She had an admission in August 2016 with a lower GI bleed felt to be due to ischemic colitis. No bleeding since.   Assessment & Plan:   Active Problems:   Hypertension   PVD (peripheral vascular disease) (HCC)   COPD (chronic obstructive pulmonary disease) (HCC)   Left leg swelling   Coronary artery calcification seen on CAT scan   Pulmonary emboli (HCC)   Acute pulmonary embolism (HCC)   Acute pulmonary embolism with left leg DVT - incidental finding -heparin gtt-- transition to xarelto in AM if no GI bleeding--- if bleeds, will need IVC filter- chest guidelines for IVC filter: IVCF are indicated for patients who have suffered an acute venous thromboembolic event (VTE) and who cannot receive anticoagulation. -discussed with IR and on call vascular Dr.  Neck pain -? Etiology -add heat and robaxin -CTA neck negative  COPD - Stable, no wheezing, resume home medications  Hypertension - Resume home medications   DVT prophylaxis:    Heparin gtt  Code Status: Full Code   Family Communication: patient  Disposition Plan:     Consultants:     Procedures:        Subjective: Still with neck pain-- describes as deep  Objective: Vitals:   11/11/15 1953 11/12/15 0457 11/12/15 0925 11/12/15 1107  BP: (!) 144/84 137/81 133/87   Pulse: (!) 58 67 65   Resp: 16 16    Temp: 97.9 F (36.6 C) 98.8 F (37.1 C)    TempSrc: Oral Oral    SpO2: 100% 98%  95%  Weight:      Height:        Intake/Output Summary (Last 24 hours) at 11/12/15 1259 Last data filed at 11/12/15 1146  Gross per 24 hour  Intake            614.4 ml  Output             1750 ml  Net          -1135.6 ml   Filed Weights   11/11/15 1047 11/11/15 1712  Weight: 90.7 kg (200 lb) 92.6 kg (204 lb 2.3 oz)    Examination:  General exam: Appears calm and comfortable  Respiratory system: Clear to auscultation. Respiratory effort normal. Cardiovascular system: S1 & S2 heard, RRR. No JVD, murmurs, rubs, gallops or clicks. No pedal edema. Gastrointestinal system: Abdomen is nondistended, soft and nontender. No organomegaly or masses felt. Normal bowel sounds heard. Central nervous system: Alert and oriented. No focal neurological deficits.  Data Reviewed: I have personally reviewed following labs and imaging studies  CBC:  Recent Labs Lab 11/11/15 1301 11/12/15 0536  WBC 6.4 7.1  HGB 12.7 11.7*  HCT 37.1 35.3*  MCV 93.7 94.9  PLT 188 [id number]   Basic Metabolic Panel:  Recent Labs Lab 11/11/15 1301 11/12/15 0536  NA 141 137  K 4.0 3.9  CL 105 102  CO2 29 27  GLUCOSE 93 106*  BUN 24* 19  CREATININE 0.85 0.87  CALCIUM 9.0 8.8*   GFR: Estimated Creatinine Clearance: 62.1 mL/min (by C-G formula based on SCr of 0.87 mg/dL). Liver Function Tests: No results for input(s): AST, ALT, ALKPHOS, BILITOT, PROT, ALBUMIN in the last 168 hours. No results for input(s): LIPASE, AMYLASE in the last 168 hours. No results for input(s):  AMMONIA in the last 168 hours. Coagulation Profile:  Recent Labs Lab 11/11/15 1301  INR 0.96   Cardiac Enzymes:  Recent Labs Lab 11/11/15 1301  TROPONINI 0.03*   BNP (last 3 results) No results for input(s): PROBNP in the last 8760 hours. HbA1C: No results for input(s): HGBA1C in the last 72 hours. CBG: No results for input(s): GLUCAP in the last 168 hours. Lipid Profile: No results for input(s): CHOL, HDL, LDLCALC, TRIG, CHOLHDL, LDLDIRECT in the last 72 hours. Thyroid Function Tests: No results for input(s): TSH, T4TOTAL, FREET4, T3FREE, THYROIDAB in the last 72 hours. Anemia Panel: No results for input(s): VITAMINB12, FOLATE, FERRITIN, TIBC, IRON, RETICCTPCT in the last 72 hours. Urine analysis:    Component Value Date/Time   COLORURINE YELLOW 10/04/2014 1415   APPEARANCEUR CLOUDY (A) 10/04/2014 1415   LABSPEC 1.018 10/04/2014 1415   PHURINE 6.0 10/04/2014 1415   GLUCOSEU NEGATIVE 10/04/2014 1415   HGBUR NEGATIVE 10/04/2014 1415   BILIRUBINUR NEGATIVE 10/04/2014 1415   KETONESUR NEGATIVE 10/04/2014 1415   PROTEINUR NEGATIVE 10/04/2014 1415   UROBILINOGEN 0.2 10/04/2014 1415   NITRITE POSITIVE (A) 10/04/2014 1415   LEUKOCYTESUR MODERATE (A) 10/04/2014 1415      Anti-infectives    None       Radiology Studies: Ct Angio Head W Or Wo Contrast  Result Date: 11/11/2015 CLINICAL DATA:  75 y/o F; left-sided headache radiating to the occipital area and 2 days of left-sided neck pain worse this morning. EXAM: CT ANGIOGRAPHY HEAD AND NECK TECHNIQUE: Multidetector CT imaging of the head and neck was performed using the standard protocol during bolus administration of intravenous contrast. Multiplanar CT image reconstructions and MIPs were obtained to evaluate the vascular anatomy. Carotid stenosis measurements (when applicable) are obtained utilizing NASCET criteria, using the distal internal carotid diameter as the denominator. CONTRAST:  100 cc Isovue 370. COMPARISON:   06/30/2013 CT head. FINDINGS: CT HEAD Brain: No evidence of large acute infarct, focal mass effect, intracranial hemorrhage, or hydrocephalus. Mild parenchymal volume loss and chronic microvascular ischemic changes. Calvarium and skull base: Negative. Paranasal sinuses: No acute finding. Orbits: Negative. CTA NECK Aortic arch: Ascending aortic aneurysm measuring up to 4.4 cm. Three vessel arch. Right carotid system: Mild calcific atherosclerosis of the right carotid bifurcation without significant stenosis. Moderate tortuosity of the right internal carotid artery with a brief retropharyngeal submucosal course. Left carotid system: Minimal calcific atherosclerosis of the left carotid bifurcation. No significant stenosis. Vertebral arteries:No occlusion, aneurysm, dissection, or significant stenosis is identified. Skeleton: Multilevel degenerative changes of the cervical spine with disc space narrowing, marginal osteophytes, and disc osteophyte complexes greatest at the C4-5 level. Where there is at least moderate canal stenosis. Multilevel uncovertebral  and facet hypertrophy results in mild to moderate bony foraminal narrowing bilaterally. 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 Heterogeneity of the right thyroid lobe may represent underlying nodules. No lymphadenopathy or discrete cervical mass is identified. Borderline nonspecific left-sided subpectoral lymph node. 2 mm right parotid gland calcification may represent sequelae of prior infectious/inflammatory process or a sialolith(series 7, image 173). No evidence of salivary duct obstruction. Upper chest: Left-sided lobar and segmental pulmonary embolus (series 7, image 21). Minor atelectasis in the lung apices bilaterally and minimal emphysematous changes. CTA HEAD Anterior circulation: Bilateral internal carotid arteries, middle cerebral arteries, and anterior cerebral arteries are patent. No occlusion, aneurysm, dissection, or significant  stenosis is identified. Posterior circulation: Codominant vertebrobasilar system. Bilateral vertebral arteries, the basilar artery, and posterior cerebral arteries are patent. No occlusion, aneurysm, dissection, or significant stenosis is identified. Venous sinuses: No thrombus identified. Anatomic variants: Bilateral fetal posterior cerebral arteries. Patent anterior communicating artery. Delayed phase: No abnormal enhancement. IMPRESSION: 1. Left-sided lobar and segmental acute pulmonary embolus. 2. No occlusion, aneurysm, dissection, or significant stenosis of the carotid or vertebral arteries of the neck. 3. No occlusion, aneurysm, dissection, or significant stenosis of the circle of Willis. 4. No acute intracranial abnormality or abnormal enhancement. Stable mild parenchymal volume loss and chronic microvascular ischemic changes. 5. Ascending aortic aneurysm measuring 4.4 cm. Recommend annual imaging followup by CTA or MRA. This recommendation follows 2010 ACCF/AHA/AATS/ACR/ASA/SCA/SCAI/SIR/STS/SVM Guidelines for the Diagnosis and Management of Patients with Thoracic Aortic Disease. Circulation. 2010; 121SP:1689793 These results were called by telephone at the time of interpretation on 11/11/2015 at 2:49 pm to Dr. Dorie Rank , who verbally acknowledged these results. Electronically Signed   By: Kristine Garbe M.D.   On: 11/11/2015 14:49   Dg Chest 2 View  Result Date: 11/11/2015 CLINICAL DATA:  Cough.  Shortness of breath. EXAM: CHEST  2 VIEW COMPARISON:  CT 02/23/2015 .  06/30/2013. FINDINGS: Cardiomegaly with mild pulmonary vascular prominence. No focal alveolar infiltrate. Mild bibasilar subsegmental atelectasis and/or scarring. No pleural effusion or pneumothorax P IMPRESSION: Cardiomegaly with mild pulmonary venous congestion. Low lung volumes with mild bibasilar subsegmental atelectasis and/or scarring again noted. These findings stable from prior exam. Electronically Signed   By: Marcello Moores   Register   On: 11/11/2015 15:31   Ct Angio Neck W And/or Wo Contrast  Result Date: 11/11/2015 CLINICAL DATA:  75 y/o F; left-sided headache radiating to the occipital area and 2 days of left-sided neck pain worse this morning. EXAM: CT ANGIOGRAPHY HEAD AND NECK TECHNIQUE: Multidetector CT imaging of the head and neck was performed using the standard protocol during bolus administration of intravenous contrast. Multiplanar CT image reconstructions and MIPs were obtained to evaluate the vascular anatomy. Carotid stenosis measurements (when applicable) are obtained utilizing NASCET criteria, using the distal internal carotid diameter as the denominator. CONTRAST:  100 cc Isovue 370. COMPARISON:  06/30/2013 CT head. FINDINGS: CT HEAD Brain: No evidence of large acute infarct, focal mass effect, intracranial hemorrhage, or hydrocephalus. Mild parenchymal volume loss and chronic microvascular ischemic changes. Calvarium and skull base: Negative. Paranasal sinuses: No acute finding. Orbits: Negative. CTA NECK Aortic arch: Ascending aortic aneurysm measuring up to 4.4 cm. Three vessel arch. Right carotid system: Mild calcific atherosclerosis of the right carotid bifurcation without significant stenosis. Moderate tortuosity of the right internal carotid artery with a brief retropharyngeal submucosal course. Left carotid system: Minimal calcific atherosclerosis of the left carotid bifurcation. No significant stenosis. Vertebral arteries:No occlusion, aneurysm, dissection, or significant stenosis  is identified. Skeleton: Multilevel degenerative changes of the cervical spine with disc space narrowing, marginal osteophytes, and disc osteophyte complexes greatest at the C4-5 level. Where there is at least moderate canal stenosis. Multilevel uncovertebral and facet hypertrophy results in mild to moderate bony foraminal narrowing bilaterally. 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 Heterogeneity of the right  thyroid lobe may represent underlying nodules. No lymphadenopathy or discrete cervical mass is identified. Borderline nonspecific left-sided subpectoral lymph node. 2 mm right parotid gland calcification may represent sequelae of prior infectious/inflammatory process or a sialolith(series 7, image 173). No evidence of salivary duct obstruction. Upper chest: Left-sided lobar and segmental pulmonary embolus (series 7, image 21). Minor atelectasis in the lung apices bilaterally and minimal emphysematous changes. CTA HEAD Anterior circulation: Bilateral internal carotid arteries, middle cerebral arteries, and anterior cerebral arteries are patent. No occlusion, aneurysm, dissection, or significant stenosis is identified. Posterior circulation: Codominant vertebrobasilar system. Bilateral vertebral arteries, the basilar artery, and posterior cerebral arteries are patent. No occlusion, aneurysm, dissection, or significant stenosis is identified. Venous sinuses: No thrombus identified. Anatomic variants: Bilateral fetal posterior cerebral arteries. Patent anterior communicating artery. Delayed phase: No abnormal enhancement. IMPRESSION: 1. Left-sided lobar and segmental acute pulmonary embolus. 2. No occlusion, aneurysm, dissection, or significant stenosis of the carotid or vertebral arteries of the neck. 3. No occlusion, aneurysm, dissection, or significant stenosis of the circle of Willis. 4. No acute intracranial abnormality or abnormal enhancement. Stable mild parenchymal volume loss and chronic microvascular ischemic changes. 5. Ascending aortic aneurysm measuring 4.4 cm. Recommend annual imaging followup by CTA or MRA. This recommendation follows 2010 ACCF/AHA/AATS/ACR/ASA/SCA/SCAI/SIR/STS/SVM Guidelines for the Diagnosis and Management of Patients with Thoracic Aortic Disease. Circulation. 2010; [id number] These results were called by telephone at the time of interpretation on 11/11/2015 at 2:49 pm to Dr. Dorie Rank , who verbally acknowledged these results. Electronically Signed   By: Kristine Garbe M.D.   On: 11/11/2015 14:49        Scheduled Meds: . aspirin EC  81 mg Oral Daily  . buPROPion  150 mg Oral Daily  . fluticasone furoate-vilanterol  1 puff Inhalation Daily  . irbesartan  150 mg Oral Daily   And  . hydrochlorothiazide  25 mg Oral Daily  . multivitamin with minerals  1 tablet Oral Daily  . sodium chloride flush  3 mL Intravenous Q12H   Continuous Infusions: . heparin 1,100 Units/hr (11/12/15 0939)     LOS: 0 days    Time spent: 25 min    [place], DO [facility] Pager [phone]  If 7PM-7AM, please contact night-coverage [email or web address] Password [email or web address] 11/12/2015, 12:59 PM

## 2015-11-12 NOTE — Progress Notes (Addendum)
ANTICOAGULATION CONSULT NOTE - Follow-Up  Pharmacy Consult for Heparin to xarelto (see addendums x 2) Indication: pulmonary embolus  Allergies  Allergen Reactions  . Penicillins Anaphylaxis    Has patient had a PCN reaction causing immediate rash, facial/tongue/throat swelling, SOB or lightheadedness with hypotension: yes Has patient had a PCN reaction causing severe rash involving mucus membranes or skin necrosis: no Has patient had a PCN reaction that required hospitalization yes Has patient had a PCN reaction occurring within the last 10 years: yes If all of the above answers are "NO", then may proceed with Cephalosporin use.   . Sulfa Antibiotics Hives  . Oxycodone Other (See Comments)    hallucanations    Patient Measurements: Height: 5\' 6"  (167.6 cm) Weight: 204 lb 2.3 oz (92.6 kg) IBW/kg (Calculated) : 59.3 Heparin Dosing Weight: 77kg  Vital Signs: Temp: 98.8 F (37.1 C) (09/09 0457) Temp Source: Oral (09/09 0457) BP: 137/81 (09/09 0457) Pulse Rate: 67 (09/09 0457)  Labs:  Recent Labs  11/11/15 1301 11/11/15 2203 11/12/15 0536  HGB 12.7  --  11.7*  HCT 37.1  --  35.3*  PLT 188  --  203  LABPROT 12.8  --   --   INR 0.96  --   --   HEPARINUNFRC  --  0.55 0.72*  CREATININE 0.85  --  0.87  TROPONINI 0.03*  --   --    Estimated Creatinine Clearance: 62.1 mL/min (by C-G formula based on SCr of 0.87 mg/dL).  Medications:   Infusions:  . heparin 1,200 Units/hr (11/11/15 1548)   Assessment: 75 yoF to ED with c/o head and neck pain. Head CT neg for acute process, R sided PE noted. No anti-coagulants PTA, hx of DVT in 2014, baseline CBC was WNL.  Today, 11/12/2015:  Heparin level slightly elevated this am at 0.72 on heparin 1200 units/hr. Heparin infusing at prescribed rate  CBC: Hgb slightly decreased from admission, pltc WNL  No obvious bleeding  Goal of Therapy:  Heparin level 0.3-0.7 units/ml Monitor platelets by anticoagulation protocol: Yes   Plan:    Reduce heparin level to 1100 units/hr  Check heparin level this afternoon  Daily CBC, Heparin level  Monitor for signs/sx bleeding  Await decision for oral anti-coagulation  Doreene Eland, PharmD, BCPS.   Pager: [phone] 11/12/2015 8:41 AM  Addendum:  Orders to change from heparin to xarelto. Patient previously on xarelto for h/o DVT  Plan: - xarelto 15mg  BIDWC x 21 days then xarelto 20mg  QDWC starting 9/30 - monitoring for bleeding - pharmacist to provide education and coupon for free first month supply - Suggest d/c ASA 81mg   Doreene Eland, PharmD, BCPS.   Pager: [phone] 11/12/2015 9:31 AM   Addendum #2: LE dopplers reveal possible mobile clot so [facility] has d/c'd orders to change to xarelto and re-ordered heparin  Plan: - resume orders for  heparin (never turned off) at 1100 units/hr - heparin level this evening - daily heparin level and CBC  Doreene Eland, PharmD, BCPS.   Pager: [phone] 11/12/2015 9:41 AM

## 2015-11-13 DIAGNOSIS — I82412 Acute embolism and thrombosis of left femoral vein: Secondary | ICD-10-CM

## 2015-11-13 LAB — CBC
HCT: 36.2 % (ref 36.0–46.0)
Hemoglobin: 12.1 g/dL (ref 12.0–15.0)
MCH: 31.3 pg (ref 26.0–34.0)
MCHC: 33.4 g/dL (ref 30.0–36.0)
MCV: 93.8 fL (ref 78.0–100.0)
Platelets: 204 10*3/uL (ref 150–400)
RBC: 3.86 MIL/uL — ABNORMAL LOW (ref 3.87–5.11)
RDW: 14.4 % (ref 11.5–15.5)
WBC: 8.7 10*3/uL (ref 4.0–10.5)

## 2015-11-13 LAB — HEPARIN LEVEL (UNFRACTIONATED): HEPARIN UNFRACTIONATED: 0.6 [IU]/mL (ref 0.30–0.70)

## 2015-11-13 MED ORDER — ACETAMINOPHEN 325 MG PO TABS
650.0000 mg | ORAL_TABLET | Freq: Four times a day (QID) | ORAL | Status: DC
  Administered 2015-11-13 – 2015-11-14 (×4): 650 mg via ORAL
  Filled 2015-11-13 (×4): qty 2

## 2015-11-13 MED ORDER — RIVAROXABAN 15 MG PO TABS
15.0000 mg | ORAL_TABLET | Freq: Two times a day (BID) | ORAL | Status: DC
  Administered 2015-11-13 – 2015-11-14 (×3): 15 mg via ORAL
  Filled 2015-11-13 (×3): qty 1

## 2015-11-13 MED ORDER — TRAMADOL HCL 50 MG PO TABS
100.0000 mg | ORAL_TABLET | Freq: Four times a day (QID) | ORAL | Status: DC | PRN
  Administered 2015-11-14: 100 mg via ORAL
  Filled 2015-11-13 (×2): qty 2

## 2015-11-13 MED ORDER — RIVAROXABAN 20 MG PO TABS: 20.0000 mg | ORAL_TABLET | Freq: Every day | ORAL | Status: DC

## 2015-11-13 MED ORDER — ONDANSETRON HCL 4 MG/2ML IJ SOLN: 4.0000 mg | Freq: Four times a day (QID) | INTRAMUSCULAR | Status: DC | PRN

## 2015-11-13 MED ORDER — ACETAMINOPHEN 325 MG PO TABS
650.0000 mg | ORAL_TABLET | ORAL | Status: DC | PRN
  Administered 2015-11-13 (×2): 650 mg via ORAL
  Filled 2015-11-13 (×2): qty 2

## 2015-11-13 NOTE — Discharge Instructions (Signed)
Information on my medicine - XARELTO (rivaroxaban)  This medication education was reviewed with me or my healthcare representative as part of my discharge preparation.  The pharmacist that spoke with me during my hospital stay was:  Henreitta Leber. PharmD WHY WAS [place]? Xarelto was prescribed to treat blood clots that may have been found in the veins of your legs (deep vein thrombosis) or in your lungs (pulmonary embolism) and to reduce the risk of them occurring again.  What do you need to know about Xarelto? The starting dose is one 15 mg tablet taken TWICE daily with food for the FIRST 21 DAYS then on Sunday, October 1st the dose is changed to one 20 mg tablet taken ONCE A DAY with your evening meal.  DO NOT stop taking Xarelto without talking to the health care provider who prescribed the medication.  Refill your prescription for 20 mg tablets before you run out.  After discharge, you should have regular check-up appointments with your healthcare provider that is prescribing your Xarelto.  In the future your dose may need to be changed if your kidney function changes by a significant amount.  What do you do if you miss a dose? If you are taking Xarelto TWICE DAILY and you miss a dose, take it as soon as you remember. You may take two 15 mg tablets (total 30 mg) at the same time then resume your regularly scheduled 15 mg twice daily the next day.  If you are taking Xarelto ONCE DAILY and you miss a dose, take it as soon as you remember on the same day then continue your regularly scheduled once daily regimen the next day. Do not take two doses of Xarelto at the same time.   Important Safety Information Xarelto is a blood thinner medicine that can cause bleeding. You should call your healthcare provider right away if you experience any of the following: ? Bleeding from an injury or your nose that does not stop. ? Unusual colored urine (red or dark brown) or unusual  colored stools (red or black). ? Unusual bruising for unknown reasons. ? A serious fall or if you hit your head (even if there is no bleeding).  Some medicines may interact with Xarelto and might increase your risk of bleeding while on Xarelto. To help avoid this, consult your healthcare provider or pharmacist prior to using any new prescription or non-prescription medications, including herbals, vitamins, non-steroidal anti-inflammatory drugs (NSAIDs) and supplements.  This website has more information on Xarelto: [email or web address]

## 2015-11-13 NOTE — Progress Notes (Signed)
PHARMACY - HEPARIN (brief note)  IV heparin infusing @ 950 units/hr for PE  Heparin rate = 0.6 (Goal 0.3-0.7) CBC stable No complications of therapy noted  Plan:  Continue IV heparin @ 950 units/hr           Recheck heparin level @ 11am to confirm therapeutic dose  Leone Haven, PharmD

## 2015-11-13 NOTE — Progress Notes (Signed)
PROGRESS NOTE    Denise Cline  [id number] DOB: 10/18/1940 DOA: 11/11/2015 PCP: Henrine Screws, MD   Outpatient Specialists:     Brief Narrative:  Denise Cline is a 75 y.o. female with medical history significant of COPD, hypertension, peripheral vascular disease, presents to the emergency room with a chief complaint of neck pain. Patient complains over the last couple days she's been having left-sided neck pain, and at the base of her skull. She complains of the pain is getting worse when she moves her head around. She denies any fever or chills. She denies any nausea or vomiting. She denies any photophobia. She denies any numbness in her arms. She has no fever or chills. She has no chest pain, no reference of breath. No abdominal pain, nausea, vomiting or diarrhea, and is otherwise feeling his baseline. 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 She finished treatment. She had an admission in August 2016 with a lower GI bleed felt to be due to ischemic colitis. No bleeding since.   Assessment & Plan:   Active Problems:   Hypertension   PVD (peripheral vascular disease) (HCC)   COPD (chronic obstructive pulmonary disease) (HCC)   Left leg swelling   Coronary artery calcification seen on CAT scan   Pulmonary emboli (HCC)   Acute pulmonary embolism (HCC)   DVT (deep venous thrombosis) (HCC)   Acute pulmonary embolism with left leg DVT - incidental finding -heparin gtt-- transition to xarelto -- watch for GI bleeding--- if bleeds, will need IVC filter- chest guidelines for IVC filter: IVCF are indicated for patients who have suffered an acute venous thromboembolic event (VTE) and who cannot receive anticoagulation. -discussed with IR and on call vascular Dr.  Neck pain -? Etiology -added heat and robaxin -CTA head/neck negative except for some Multilevel degenerative changes of the cervical spine with  disc space narrowing, marginal osteophytes, and disc osteophyte complexes greatest at the C4-5 level. -scheduled tylenol -outpatient NS follow up if conservative measures fail  COPD - Stable, no wheezing, resume home medications  Hypertension - Resume home medications  Ascending aortic aneurysm measuring 4.4 cm. Recommend annual imaging followup by CTA or MRA  DVT prophylaxis:  Heparin gtt--> xarelto  Code Status: Full Code   Family Communication: patient  Disposition Plan:     Consultants:     Procedures:        Subjective: Still with neck pain-- describes as deep Hurt to open mouth  Objective: Vitals:   11/13/15 0305 11/13/15 0558 11/13/15 0912 11/13/15 0954  BP: (!) 145/93 133/73  111/87  Pulse: 82 69  67  Resp: 16 17    Temp: 97.7 F (36.5 C) 98.3 F (36.8 C)    TempSrc: Oral Oral    SpO2: 96% 97% 94%   Weight:      Height:        Intake/Output Summary (Last 24 hours) at 11/13/15 1319 Last data filed at 11/13/15 1010  Gross per 24 hour  Intake           324.08 ml  Output             2250 ml  Net         -1925.92 ml   Filed Weights   11/11/15 1047 11/11/15 1712  Weight: 90.7 kg (200 lb) 92.6 kg (204 lb 2.3 oz)    Examination:  General exam: Appears calm and  comfortable  Respiratory system: Clear to auscultation. Respiratory effort normal. Cardiovascular system: S1 & S2 heard, RRR. No JVD, murmurs, rubs, gallops or clicks. No pedal edema. Gastrointestinal system: Abdomen is nondistended, soft and nontender. No organomegaly or masses felt. Normal bowel sounds heard. Central nervous system: Alert and oriented. No focal neurological deficits.    Data Reviewed: I have personally reviewed following labs and imaging studies  CBC:  Recent Labs Lab 11/11/15 1301 11/12/15 0536 11/13/15 0316  WBC 6.4 7.1 8.7  HGB 12.7 11.7* 12.1  HCT 37.1 35.3* 36.2  MCV 93.7 94.9 93.8  PLT 188 203 0000000   Basic Metabolic Panel:  Recent Labs Lab  11/11/15 1301 11/12/15 0536  NA 141 137  K 4.0 3.9  CL 105 102  CO2 29 27  GLUCOSE 93 106*  BUN 24* 19  CREATININE 0.85 0.87  CALCIUM 9.0 8.8*   GFR: Estimated Creatinine Clearance: 62.1 mL/min (by C-G formula based on SCr of 0.87 mg/dL). Liver Function Tests: No results for input(s): AST, ALT, ALKPHOS, BILITOT, PROT, ALBUMIN in the last 168 hours. No results for input(s): LIPASE, AMYLASE in the last 168 hours. No results for input(s): AMMONIA in the last 168 hours. Coagulation Profile:  Recent Labs Lab 11/11/15 1301  INR 0.96   Cardiac Enzymes:  Recent Labs Lab 11/11/15 1301  TROPONINI 0.03*   BNP (last 3 results) No results for input(s): PROBNP in the last 8760 hours. HbA1C: No results for input(s): HGBA1C in the last 72 hours. CBG: No results for input(s): GLUCAP in the last 168 hours. Lipid Profile: No results for input(s): CHOL, HDL, LDLCALC, TRIG, CHOLHDL, LDLDIRECT in the last 72 hours. Thyroid Function Tests: No results for input(s): TSH, T4TOTAL, FREET4, T3FREE, THYROIDAB in the last 72 hours. Anemia Panel: No results for input(s): VITAMINB12, FOLATE, FERRITIN, TIBC, IRON, RETICCTPCT in the last 72 hours. Urine analysis:    Component Value Date/Time   COLORURINE YELLOW 10/04/2014 1415   APPEARANCEUR CLOUDY (A) 10/04/2014 1415   LABSPEC 1.018 10/04/2014 1415   PHURINE 6.0 10/04/2014 1415   GLUCOSEU NEGATIVE 10/04/2014 1415   HGBUR NEGATIVE 10/04/2014 1415   BILIRUBINUR NEGATIVE 10/04/2014 1415   KETONESUR NEGATIVE 10/04/2014 1415   PROTEINUR NEGATIVE 10/04/2014 1415   UROBILINOGEN 0.2 10/04/2014 1415   NITRITE POSITIVE (A) 10/04/2014 1415   LEUKOCYTESUR MODERATE (A) 10/04/2014 1415      Anti-infectives    None       Radiology Studies: Ct Angio Head W Or Wo Contrast  Result Date: 11/11/2015 CLINICAL DATA:  75 y/o F; left-sided headache radiating to the occipital area and 2 days of left-sided neck pain worse this morning. EXAM: CT  ANGIOGRAPHY HEAD AND NECK TECHNIQUE: Multidetector CT imaging of the head and neck was performed using the standard protocol during bolus administration of intravenous contrast. Multiplanar CT image reconstructions and MIPs were obtained to evaluate the vascular anatomy. Carotid stenosis measurements (when applicable) are obtained utilizing NASCET criteria, using the distal internal carotid diameter as the denominator. CONTRAST:  100 cc Isovue 370. COMPARISON:  06/30/2013 CT head. FINDINGS: CT HEAD Brain: No evidence of large acute infarct, focal mass effect, intracranial hemorrhage, or hydrocephalus. Mild parenchymal volume loss and chronic microvascular ischemic changes. Calvarium and skull base: Negative. Paranasal sinuses: No acute finding. Orbits: Negative. CTA NECK Aortic arch: Ascending aortic aneurysm measuring up to 4.4 cm. Three vessel arch. Right carotid system: Mild calcific atherosclerosis of the right carotid bifurcation without significant stenosis. Moderate tortuosity of the right internal carotid  artery with a brief retropharyngeal submucosal course. Left carotid system: Minimal calcific atherosclerosis of the left carotid bifurcation. No significant stenosis. Vertebral arteries:No occlusion, aneurysm, dissection, or significant stenosis is identified. Skeleton: Multilevel degenerative changes of the cervical spine with disc space narrowing, marginal osteophytes, and disc osteophyte complexes greatest at the C4-5 level. Where there is at least moderate canal stenosis. Multilevel uncovertebral and facet hypertrophy results in mild to moderate bony foraminal narrowing bilaterally. 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 Heterogeneity of the right thyroid lobe may represent underlying nodules. No lymphadenopathy or discrete cervical mass is identified. Borderline nonspecific left-sided subpectoral lymph node. 2 mm right parotid gland calcification may represent sequelae of prior  infectious/inflammatory process or a sialolith(series 7, image 173). No evidence of salivary duct obstruction. Upper chest: Left-sided lobar and segmental pulmonary embolus (series 7, image 21). Minor atelectasis in the lung apices bilaterally and minimal emphysematous changes. CTA HEAD Anterior circulation: Bilateral internal carotid arteries, middle cerebral arteries, and anterior cerebral arteries are patent. No occlusion, aneurysm, dissection, or significant stenosis is identified. Posterior circulation: Codominant vertebrobasilar system. Bilateral vertebral arteries, the basilar artery, and posterior cerebral arteries are patent. No occlusion, aneurysm, dissection, or significant stenosis is identified. Venous sinuses: No thrombus identified. Anatomic variants: Bilateral fetal posterior cerebral arteries. Patent anterior communicating artery. Delayed phase: No abnormal enhancement. IMPRESSION: 1. Left-sided lobar and segmental acute pulmonary embolus. 2. No occlusion, aneurysm, dissection, or significant stenosis of the carotid or vertebral arteries of the neck. 3. No occlusion, aneurysm, dissection, or significant stenosis of the circle of Willis. 4. No acute intracranial abnormality or abnormal enhancement. Stable mild parenchymal volume loss and chronic microvascular ischemic changes. 5. Ascending aortic aneurysm measuring 4.4 cm. Recommend annual imaging followup by CTA or MRA. This recommendation follows 2010 ACCF/AHA/AATS/ACR/ASA/SCA/SCAI/SIR/STS/SVM Guidelines for the Diagnosis and Management of Patients with Thoracic Aortic Disease. Circulation. 2010; [id number] These results were called by telephone at the time of interpretation on 11/11/2015 at 2:49 pm to Dr. Dorie Rank , who verbally acknowledged these results. Electronically Signed   By: Kristine Garbe M.D.   On: 11/11/2015 14:49   Dg Chest 2 View  Result Date: 11/11/2015 CLINICAL DATA:  Cough.  Shortness of breath. EXAM: CHEST  2 VIEW  COMPARISON:  CT 02/23/2015 .  06/30/2013. FINDINGS: Cardiomegaly with mild pulmonary vascular prominence. No focal alveolar infiltrate. Mild bibasilar subsegmental atelectasis and/or scarring. No pleural effusion or pneumothorax P IMPRESSION: Cardiomegaly with mild pulmonary venous congestion. Low lung volumes with mild bibasilar subsegmental atelectasis and/or scarring again noted. These findings stable from prior exam. Electronically Signed   By: Marcello Moores  Register   On: 11/11/2015 15:31   Ct Angio Neck W And/or Wo Contrast  Result Date: 11/11/2015 CLINICAL DATA:  75 y/o F; left-sided headache radiating to the occipital area and 2 days of left-sided neck pain worse this morning. EXAM: CT ANGIOGRAPHY HEAD AND NECK TECHNIQUE: Multidetector CT imaging of the head and neck was performed using the standard protocol during bolus administration of intravenous contrast. Multiplanar CT image reconstructions and MIPs were obtained to evaluate the vascular anatomy. Carotid stenosis measurements (when applicable) are obtained utilizing NASCET criteria, using the distal internal carotid diameter as the denominator. CONTRAST:  100 cc Isovue 370. COMPARISON:  06/30/2013 CT head. FINDINGS: CT HEAD Brain: No evidence of large acute infarct, focal mass effect, intracranial hemorrhage, or hydrocephalus. Mild parenchymal volume loss and chronic microvascular ischemic changes. Calvarium and skull base: Negative. Paranasal sinuses: No acute finding. Orbits: Negative. CTA  NECK Aortic arch: Ascending aortic aneurysm measuring up to 4.4 cm. Three vessel arch. Right carotid system: Mild calcific atherosclerosis of the right carotid bifurcation without significant stenosis. Moderate tortuosity of the right internal carotid artery with a brief retropharyngeal submucosal course. Left carotid system: Minimal calcific atherosclerosis of the left carotid bifurcation. No significant stenosis. Vertebral arteries:No occlusion, aneurysm,  dissection, or significant stenosis is identified. Skeleton: Multilevel degenerative changes of the cervical spine with disc space narrowing, marginal osteophytes, and disc osteophyte complexes greatest at the C4-5 level. Where there is at least moderate canal stenosis. Multilevel uncovertebral and facet hypertrophy results in mild to moderate bony foraminal narrowing bilaterally. 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 Heterogeneity of the right thyroid lobe may represent underlying nodules. No lymphadenopathy or discrete cervical mass is identified. Borderline nonspecific left-sided subpectoral lymph node. 2 mm right parotid gland calcification may represent sequelae of prior infectious/inflammatory process or a sialolith(series 7, image 173). No evidence of salivary duct obstruction. Upper chest: Left-sided lobar and segmental pulmonary embolus (series 7, image 21). Minor atelectasis in the lung apices bilaterally and minimal emphysematous changes. CTA HEAD Anterior circulation: Bilateral internal carotid arteries, middle cerebral arteries, and anterior cerebral arteries are patent. No occlusion, aneurysm, dissection, or significant stenosis is identified. Posterior circulation: Codominant vertebrobasilar system. Bilateral vertebral arteries, the basilar artery, and posterior cerebral arteries are patent. No occlusion, aneurysm, dissection, or significant stenosis is identified. Venous sinuses: No thrombus identified. Anatomic variants: Bilateral fetal posterior cerebral arteries. Patent anterior communicating artery. Delayed phase: No abnormal enhancement. IMPRESSION: 1. Left-sided lobar and segmental acute pulmonary embolus. 2. No occlusion, aneurysm, dissection, or significant stenosis of the carotid or vertebral arteries of the neck. 3. No occlusion, aneurysm, dissection, or significant stenosis of the circle of Willis. 4. No acute intracranial abnormality or abnormal enhancement. Stable mild  parenchymal volume loss and chronic microvascular ischemic changes. 5. Ascending aortic aneurysm measuring 4.4 cm. Recommend annual imaging followup by CTA or MRA. This recommendation follows 2010 ACCF/AHA/AATS/ACR/ASA/SCA/SCAI/SIR/STS/SVM Guidelines for the Diagnosis and Management of Patients with Thoracic Aortic Disease. Circulation. 2010; [id number] These results were called by telephone at the time of interpretation on 11/11/2015 at 2:49 pm to Dr. Dorie Rank , who verbally acknowledged these results. Electronically Signed   By: Kristine Garbe M.D.   On: 11/11/2015 14:49        Scheduled Meds: . acetaminophen  650 mg Oral Q6H  . buPROPion  150 mg Oral Daily  . docusate sodium  100 mg Oral BID  . fluticasone furoate-vilanterol  1 puff Inhalation Daily  . irbesartan  150 mg Oral Daily   And  . hydrochlorothiazide  25 mg Oral Daily  . multivitamin with minerals  1 tablet Oral Daily  . Rivaroxaban  15 mg Oral BID WC  . [START ON 12/04/2015] Rivaroxaban  20 mg Oral Q supper  . sodium chloride flush  3 mL Intravenous Q12H   Continuous Infusions:     LOS: 1 day    Time spent: 25 min    [place], DO [facility] Pager [phone]  If 7PM-7AM, please contact night-coverage [email or web address] Password [email or web address] 11/13/2015, 1:19 PM

## 2015-11-13 NOTE — Progress Notes (Signed)
ANTICOAGULATION CONSULT NOTE - Follow-Up  Pharmacy Consult for Heparin to xarelto Indication: pulmonary embolus  Allergies  Allergen Reactions  . Penicillins Anaphylaxis    Has patient had a PCN reaction causing immediate rash, facial/tongue/throat swelling, SOB or lightheadedness with hypotension: yes Has patient had a PCN reaction causing severe rash involving mucus membranes or skin necrosis: no Has patient had a PCN reaction that required hospitalization yes Has patient had a PCN reaction occurring within the last 10 years: yes If all of the above answers are "NO", then may proceed with Cephalosporin use.   . Sulfa Antibiotics Hives  . Oxycodone Other (See Comments)    hallucanations    Patient Measurements: Height: 5\' 6"  (167.6 cm) Weight: 204 lb 2.3 oz (92.6 kg) IBW/kg (Calculated) : 59.3 Heparin Dosing Weight: 77kg  Vital Signs: Temp: 98.3 F (36.8 C) (09/10 0558) Temp Source: Oral (09/10 0558) BP: 133/73 (09/10 0558) Pulse Rate: 69 (09/10 0558)  Labs:  Recent Labs  11/11/15 1301  11/12/15 0536 11/12/15 1651 11/13/15 0316  HGB 12.7  --  11.7*  --  12.1  HCT 37.1  --  35.3*  --  36.2  PLT 188  --  203  --  204  LABPROT 12.8  --   --   --   --   INR 0.96  --   --   --   --   HEPARINUNFRC  --   < > 0.72* 0.86* 0.60  CREATININE 0.85  --  0.87  --   --   TROPONINI 0.03*  --   --   --   --   < > = values in this interval not displayed. Estimated Creatinine Clearance: 62.1 mL/min (by C-G formula based on SCr of 0.87 mg/dL).  Medications:   Infusions:  . heparin 950 Units/hr (11/12/15 1809)   Assessment: Denise Cline to ED with c/o head and neck pain. Head CT neg for acute process, R sided PE noted. + DVT.  No anti-coagulants PTA, hx of DVT in 2014, baseline CBC was WNL.  Today, 11/13/2015:  Heparin level therapeutic earlier this am for first time (supratherapeutic levels prior to this) - now orders to change to xarelto  CBC: Hgb and pltc WNL  No obvious  bleeding, has h/o GIB  Goal of Therapy:  Heparin level 0.3-0.7 units/ml Monitor platelets by anticoagulation protocol: Yes   Plan:  - Xarelto 15mg  BIDWC x 21 days then xarelto 20mg  QDWC starting 10/1 - monitoring for bleeding - pharmacist to provide education and coupon for free first month supply - Suggest d/c ASA 81mg  and will need to avoid NSAIDs  Doreene Eland, PharmD, BCPS.   Pager: [phone] 11/13/2015 9:35 AM

## 2015-11-14 DIAGNOSIS — J42 Unspecified chronic bronchitis: Secondary | ICD-10-CM

## 2015-11-14 LAB — BASIC METABOLIC PANEL
Anion gap: 9 (ref 5–15)
BUN: 22 mg/dL — AB (ref 6–20)
CALCIUM: 8.9 mg/dL (ref 8.9–10.3)
CHLORIDE: 101 mmol/L (ref 101–111)
CO2: 27 mmol/L (ref 22–32)
CREATININE: 1.05 mg/dL — AB (ref 0.44–1.00)
GFR calc non Af Amer: 50 mL/min — ABNORMAL LOW (ref >60)
GFR, EST AFRICAN AMERICAN: 58 mL/min — AB (ref >60)
GLUCOSE: 106 mg/dL — AB (ref 65–99)
Potassium: 3.8 mmol/L (ref 3.5–5.1)
Sodium: 137 mmol/L (ref 135–145)

## 2015-11-14 LAB — CBC
HEMATOCRIT: 35.3 % — AB (ref 36.0–46.0)
Hemoglobin: 11.7 g/dL — ABNORMAL LOW (ref 12.0–15.0)
MCH: 30.6 pg (ref 26.0–34.0)
MCHC: 33.1 g/dL (ref 30.0–36.0)
MCV: 92.4 fL (ref 78.0–100.0)
Platelets: 215 10*3/uL (ref 150–400)
RBC: 3.82 MIL/uL — ABNORMAL LOW (ref 3.87–5.11)
RDW: 14.7 % (ref 11.5–15.5)
WBC: 6.7 10*3/uL (ref 4.0–10.5)

## 2015-11-14 MED ORDER — FLUTICASONE FUROATE-VILANTEROL 100-25 MCG/INH IN AEPB: 1.0000 | INHALATION_SPRAY | Freq: Every day | RESPIRATORY_TRACT | Status: DC

## 2015-11-14 MED ORDER — METHOCARBAMOL 500 MG PO TABS: 500.0000 mg | ORAL_TABLET | Freq: Four times a day (QID) | ORAL | 0 refills | Status: DC | PRN

## 2015-11-14 MED ORDER — ACETAMINOPHEN 325 MG PO TABS: 650.0000 mg | ORAL_TABLET | Freq: Four times a day (QID) | ORAL | Status: DC

## 2015-11-14 MED ORDER — DOCUSATE SODIUM 100 MG PO CAPS: 100.0000 mg | ORAL_CAPSULE | Freq: Two times a day (BID) | ORAL | 0 refills | Status: DC

## 2015-11-14 MED ORDER — RIVAROXABAN (XARELTO) VTE STARTER PACK (15 & 20 MG): ORAL_TABLET | ORAL | 0 refills | Status: DC

## 2015-11-14 NOTE — Care Management Note (Signed)
Case Management Note  Patient Details  Name: Denise Cline MRN: [id number] Date of Birth: 1940-03-16  Subjective/Objective:75 y/o f admitted w/Pulmonary Emboli. From home.                    Action/Plan:d/c home no needs or orders.   Expected Discharge Date:   (unknown)               Expected Discharge Plan:  Home/Self Care  In-House Referral:     Discharge planning Services  CM Consult  Post Acute Care Choice:    Choice offered to:     DME Arranged:    DME Agency:     HH Arranged:    [place] Agency:     Status of Service:  Completed, signed off  If discussed at [employer] of Stay Meetings, dates discussed:    Additional Comments:  Dessa Phi, RN 11/14/2015, 10:56 AM

## 2015-11-14 NOTE — Discharge Summary (Signed)
Physician Discharge Summary  Denise Cline [id number] DOB: 1940/06/05 DOA: 11/11/2015  PCP: Denise Screws, MD  Admit date: 11/11/2015 Discharge date: 11/14/2015   Recommendations for Outpatient Follow-Up:   1. May need IVC filter if can not take oral anticoagulation due to GI bleed 2. If conservative treatment of neck pain fails, may need referral for bone spur to NS   Discharge Diagnosis:   Active Problems:   Hypertension   PVD (peripheral vascular disease) (HCC)   COPD (chronic obstructive pulmonary disease) (HCC)   Left leg swelling   Coronary artery calcification seen on CAT scan   Pulmonary emboli (HCC)   Acute pulmonary embolism (HCC)   DVT (deep venous thrombosis) ([place])   Discharge disposition:  Home.  Discharge Condition: Improved.  Diet recommendation: Low sodium, heart healthy.  Wound care: None.   History of Present Illness:    Denise Cline is a 75 y.o. female with medical history significant of COPD, hypertension, peripheral vascular disease, presents to the emergency room with a chief complaint of neck pain. Patient complains over the last couple days she's been having left-sided neck pain, and at the base of her skull. She complains of the pain is getting worse when she moves her head around. She denies any fever or chills. She denies any nausea or vomiting. She denies any photophobia. She denies any numbness in her arms. She has no fever or chills. She has no chest pain, no reference of breath. No abdominal pain, nausea, vomiting or diarrhea, and is otherwise feeling his baseline. 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 She finished treatment. She had an admission in August 2016 with a lower GI bleed felt to be due to ischemic colitis. No bleeding since.   Hospital Course by Problem:   Acute pulmonary embolism with left leg DVT - incidental finding -heparin gtt-- transition to  xarelto -- watch for GI bleeding--- if bleeds, will need IVC filter- chest guidelines for IVC filter: IVCF are indicated for patients who have suffered an acute venous thromboembolic event (VTE) and who cannot receive anticoagulation. -discussed with IR and on call vascular Dr.  Neck pain -? Etiology -added heat and robaxin -CTA head/neck negative except for some Multilevel degenerative changes of the cervical spine with disc space narrowing, marginal osteophytes, and disc osteophyte complexes greatest at the C4-5 level. -scheduled tylenol -outpatient NS follow up if conservative measures fail  COPD - Stable, no wheezing, resume home medications  Hypertension - Resume home medications  Ascending aortic aneurysm measuring 4.4 cm. Recommend annual imaging followup by CTA or MRA    Medical Consultants:    Vascular/IR- phone   Discharge Exam:   Vitals:   11/13/15 2045 11/14/15 0449  BP: 134/85 126/83  Pulse: 78 (!) 57  Resp: 18 18  Temp: 98 F (36.7 C) 99 F (37.2 C)   Vitals:   11/13/15 1422 11/13/15 2045 11/14/15 0449 11/14/15 0853  BP: 133/77 134/85 126/83   Pulse: 64 78 (!) 57   Resp: 18 18 18    Temp: 97.7 F (36.5 C) 98 F (36.7 C) 99 F (37.2 C)   TempSrc: Oral Oral Oral   SpO2: 97% 100% 96% 97%  Weight:      Height:        Gen:  NAD    The results of significant diagnostics from this hospitalization (including imaging, microbiology, ancillary and laboratory) are listed below  for reference.     Procedures and Diagnostic Studies:   Ct Angio Head W Or Wo Contrast  Result Date: 11/11/2015 CLINICAL DATA:  75 y/o F; left-sided headache radiating to the occipital area and 2 days of left-sided neck pain worse this morning. EXAM: CT ANGIOGRAPHY HEAD AND NECK TECHNIQUE: Multidetector CT imaging of the head and neck was performed using the standard protocol during bolus administration of intravenous contrast. Multiplanar CT image reconstructions and MIPs  were obtained to evaluate the vascular anatomy. Carotid stenosis measurements (when applicable) are obtained utilizing NASCET criteria, using the distal internal carotid diameter as the denominator. CONTRAST:  100 cc Isovue 370. COMPARISON:  06/30/2013 CT head. FINDINGS: CT HEAD Brain: No evidence of large acute infarct, focal mass effect, intracranial hemorrhage, or hydrocephalus. Mild parenchymal volume loss and chronic microvascular ischemic changes. Calvarium and skull base: Negative. Paranasal sinuses: No acute finding. Orbits: Negative. CTA NECK Aortic arch: Ascending aortic aneurysm measuring up to 4.4 cm. Three vessel arch. Right carotid system: Mild calcific atherosclerosis of the right carotid bifurcation without significant stenosis. Moderate tortuosity of the right internal carotid artery with a brief retropharyngeal submucosal course. Left carotid system: Minimal calcific atherosclerosis of the left carotid bifurcation. No significant stenosis. Vertebral arteries:No occlusion, aneurysm, dissection, or significant stenosis is identified. Skeleton: Multilevel degenerative changes of the cervical spine with disc space narrowing, marginal osteophytes, and disc osteophyte complexes greatest at the C4-5 level. Where there is at least moderate canal stenosis. Multilevel uncovertebral and facet hypertrophy results in mild to moderate bony foraminal narrowing bilaterally. 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 Heterogeneity of the right thyroid lobe may represent underlying nodules. No lymphadenopathy or discrete cervical mass is identified. Borderline nonspecific left-sided subpectoral lymph node. 2 mm right parotid gland calcification may represent sequelae of prior infectious/inflammatory process or a sialolith(series 7, image 173). No evidence of salivary duct obstruction. Upper chest: Left-sided lobar and segmental pulmonary embolus (series 7, image 21). Minor atelectasis in the lung apices  bilaterally and minimal emphysematous changes. CTA HEAD Anterior circulation: Bilateral internal carotid arteries, middle cerebral arteries, and anterior cerebral arteries are patent. No occlusion, aneurysm, dissection, or significant stenosis is identified. Posterior circulation: Codominant vertebrobasilar system. Bilateral vertebral arteries, the basilar artery, and posterior cerebral arteries are patent. No occlusion, aneurysm, dissection, or significant stenosis is identified. Venous sinuses: No thrombus identified. Anatomic variants: Bilateral fetal posterior cerebral arteries. Patent anterior communicating artery. Delayed phase: No abnormal enhancement. IMPRESSION: 1. Left-sided lobar and segmental acute pulmonary embolus. 2. No occlusion, aneurysm, dissection, or significant stenosis of the carotid or vertebral arteries of the neck. 3. No occlusion, aneurysm, dissection, or significant stenosis of the circle of Willis. 4. No acute intracranial abnormality or abnormal enhancement. Stable mild parenchymal volume loss and chronic microvascular ischemic changes. 5. Ascending aortic aneurysm measuring 4.4 cm. Recommend annual imaging followup by CTA or MRA. This recommendation follows 2010 ACCF/AHA/AATS/ACR/ASA/SCA/SCAI/SIR/STS/SVM Guidelines for the Diagnosis and Management of Patients with Thoracic Aortic Disease. Circulation. 2010; [id number] These results were called by telephone at the time of interpretation on 11/11/2015 at 2:49 pm to Dr. Dorie Rank , who verbally acknowledged these results. Electronically Signed   By: Kristine Garbe M.D.   On: 11/11/2015 14:49   Dg Chest 2 View  Result Date: 11/11/2015 CLINICAL DATA:  Cough.  Shortness of breath. EXAM: CHEST  2 VIEW COMPARISON:  CT 02/23/2015 .  06/30/2013. FINDINGS: Cardiomegaly with mild pulmonary vascular prominence. No focal alveolar infiltrate. Mild bibasilar subsegmental atelectasis and/or  scarring. No pleural effusion or pneumothorax P  IMPRESSION: Cardiomegaly with mild pulmonary venous congestion. Low lung volumes with mild bibasilar subsegmental atelectasis and/or scarring again noted. These findings stable from prior exam. Electronically Signed   By: Marcello Moores  Register   On: 11/11/2015 15:31   Ct Angio Neck W And/or Wo Contrast  Result Date: 11/11/2015 CLINICAL DATA:  75 y/o F; left-sided headache radiating to the occipital area and 2 days of left-sided neck pain worse this morning. EXAM: CT ANGIOGRAPHY HEAD AND NECK TECHNIQUE: Multidetector CT imaging of the head and neck was performed using the standard protocol during bolus administration of intravenous contrast. Multiplanar CT image reconstructions and MIPs were obtained to evaluate the vascular anatomy. Carotid stenosis measurements (when applicable) are obtained utilizing NASCET criteria, using the distal internal carotid diameter as the denominator. CONTRAST:  100 cc Isovue 370. COMPARISON:  06/30/2013 CT head. FINDINGS: CT HEAD Brain: No evidence of large acute infarct, focal mass effect, intracranial hemorrhage, or hydrocephalus. Mild parenchymal volume loss and chronic microvascular ischemic changes. Calvarium and skull base: Negative. Paranasal sinuses: No acute finding. Orbits: Negative. CTA NECK Aortic arch: Ascending aortic aneurysm measuring up to 4.4 cm. Three vessel arch. Right carotid system: Mild calcific atherosclerosis of the right carotid bifurcation without significant stenosis. Moderate tortuosity of the right internal carotid artery with a brief retropharyngeal submucosal course. Left carotid system: Minimal calcific atherosclerosis of the left carotid bifurcation. No significant stenosis. Vertebral arteries:No occlusion, aneurysm, dissection, or significant stenosis is identified. Skeleton: Multilevel degenerative changes of the cervical spine with disc space narrowing, marginal osteophytes, and disc osteophyte complexes greatest at the C4-5 level. Where there is at  least moderate canal stenosis. Multilevel uncovertebral and facet hypertrophy results in mild to moderate bony foraminal narrowing bilaterally. 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 Heterogeneity of the right thyroid lobe may represent underlying nodules. No lymphadenopathy or discrete cervical mass is identified. Borderline nonspecific left-sided subpectoral lymph node. 2 mm right parotid gland calcification may represent sequelae of prior infectious/inflammatory process or a sialolith(series 7, image 173). No evidence of salivary duct obstruction. Upper chest: Left-sided lobar and segmental pulmonary embolus (series 7, image 21). Minor atelectasis in the lung apices bilaterally and minimal emphysematous changes. CTA HEAD Anterior circulation: Bilateral internal carotid arteries, middle cerebral arteries, and anterior cerebral arteries are patent. No occlusion, aneurysm, dissection, or significant stenosis is identified. Posterior circulation: Codominant vertebrobasilar system. Bilateral vertebral arteries, the basilar artery, and posterior cerebral arteries are patent. No occlusion, aneurysm, dissection, or significant stenosis is identified. Venous sinuses: No thrombus identified. Anatomic variants: Bilateral fetal posterior cerebral arteries. Patent anterior communicating artery. Delayed phase: No abnormal enhancement. IMPRESSION: 1. Left-sided lobar and segmental acute pulmonary embolus. 2. No occlusion, aneurysm, dissection, or significant stenosis of the carotid or vertebral arteries of the neck. 3. No occlusion, aneurysm, dissection, or significant stenosis of the circle of Willis. 4. No acute intracranial abnormality or abnormal enhancement. Stable mild parenchymal volume loss and chronic microvascular ischemic changes. 5. Ascending aortic aneurysm measuring 4.4 cm. Recommend annual imaging followup by CTA or MRA. This recommendation follows 2010 ACCF/AHA/AATS/ACR/ASA/SCA/SCAI/SIR/STS/SVM  Guidelines for the Diagnosis and Management of Patients with Thoracic Aortic Disease. Circulation. 2010; [id number] These results were called by telephone at the time of interpretation on 11/11/2015 at 2:49 pm to Dr. Dorie Rank , who verbally acknowledged these results. Electronically Signed   By: Kristine Garbe M.D.   On: 11/11/2015 14:49     Labs:   Basic Metabolic Panel:  Recent Labs Lab 11/11/15 1301 11/12/15 0536 11/14/15 0459  NA 141 137 137  K 4.0 3.9 3.8  CL 105 102 101  CO2 29 27 27   GLUCOSE 93 106* 106*  BUN 24* 19 22*  CREATININE 0.85 0.87 1.05*  CALCIUM 9.0 8.8* 8.9   GFR Estimated Creatinine Clearance: 51.4 mL/min (by C-G formula based on SCr of 1.05 mg/dL). Liver Function Tests: No results for input(s): AST, ALT, ALKPHOS, BILITOT, PROT, ALBUMIN in the last 168 hours. No results for input(s): LIPASE, AMYLASE in the last 168 hours. No results for input(s): AMMONIA in the last 168 hours. Coagulation profile  Recent Labs Lab 11/11/15 1301  INR 0.96    CBC:  Recent Labs Lab 11/11/15 1301 11/12/15 0536 11/13/15 0316 11/14/15 0459  WBC 6.4 7.1 8.7 6.7  HGB 12.7 11.7* 12.1 11.7*  HCT 37.1 35.3* 36.2 35.3*  MCV 93.7 94.9 93.8 92.4  PLT 188 203 204 215   Cardiac Enzymes:  Recent Labs Lab 11/11/15 1301  TROPONINI 0.03*   BNP: Invalid input(s): POCBNP CBG: No results for input(s): GLUCAP in the last 168 hours. D-Dimer No results for input(s): DDIMER in the last 72 hours. Hgb A1c No results for input(s): HGBA1C in the last 72 hours. Lipid Profile No results for input(s): CHOL, HDL, LDLCALC, TRIG, CHOLHDL, LDLDIRECT in the last 72 hours. Thyroid function studies No results for input(s): TSH, T4TOTAL, T3FREE, THYROIDAB in the last 72 hours.  Invalid input(s): FREET3 Anemia work up No results for input(s): VITAMINB12, FOLATE, FERRITIN, TIBC, IRON, RETICCTPCT in the last 72 hours. Microbiology No results found for this or any previous  visit (from the past 240 hour(s)).   Discharge Instructions:   Discharge Instructions    Diet - low sodium heart healthy    Complete by:  As directed   Discharge instructions    Complete by:  As directed   Any blood in stool or dark tarry appearing stools, please come back to ER as you will nee filter placed   Increase activity slowly    Complete by:  As directed       Medication List    STOP taking these medications   aspirin EC 81 MG tablet   ibuprofen 200 MG tablet Commonly known as:  ADVIL,MOTRIN     TAKE these medications   acetaminophen 325 MG tablet Commonly known as:  TYLENOL Take 2 tablets (650 mg total) by mouth every 6 (six) hours.   buPROPion 150 MG 12 hr tablet Commonly known as:  WELLBUTRIN SR Take 150 mg by mouth daily.   CENTRUM SILVER ADULT 50+ Tabs Take 1 tablet by mouth daily.   docusate sodium 100 MG capsule Commonly known as:  COLACE Take 1 capsule (100 mg total) by mouth 2 (two) times daily.   fluticasone furoate-vilanterol 100-25 MCG/INH Aepb Commonly known as:  BREO ELLIPTA Inhale 1 puff into the lungs daily. What changed:  when to take this  reasons to take this   methocarbamol 500 MG tablet Commonly known as:  ROBAXIN Take 1 tablet (500 mg total) by mouth every 6 (six) hours as needed for muscle spasms (neck pain).   OVER THE COUNTER MEDICATION Take 1 capsule by mouth daily as needed (constipation.). Med Name: COLON CLENZ   Rivaroxaban 15 & 20 MG Tbpk Take as directed on package: Start with one 15mg  tablet by mouth twice a day with food. On Day 22, switch to one 20mg  tablet once a day with food.   traMADol  50 MG tablet Commonly known as:  ULTRAM Take 1 tablet (50 mg total) by mouth every 6 (six) hours as needed for moderate pain.   valsartan-hydrochlorothiazide 160-25 MG tablet Commonly known as:  DIOVAN-HCT Take 1 tablet by mouth daily.      Follow-up Information    GATES,ROBERT NEVILL, MD Follow up in 1 week(s).     Specialty:  Internal Medicine Contact information: [address] [place] [address] [phone]            Time coordinating discharge: 35 min  Signed:  Kohner Orlick Alison Stalling   [facility] 11/14/2015, 1:19 PM

## 2016-01-16 ENCOUNTER — Other Ambulatory Visit: Payer: Self-pay | Admitting: Nurse Practitioner

## 2016-01-16 ENCOUNTER — Ambulatory Visit
Admission: RE | Admit: 2016-01-16 | Discharge: 2016-01-16 | Disposition: A | Discharge status: Home / Self Care | Payer: Medicare Other | Source: Ambulatory Visit | Attending: Nurse Practitioner | Admitting: Nurse Practitioner

## 2016-01-16 DIAGNOSIS — R059 Cough, unspecified: Secondary | ICD-10-CM

## 2016-01-16 DIAGNOSIS — R05 Cough: Secondary | ICD-10-CM

## 2016-01-16 MED ORDER — IOPAMIDOL (ISOVUE-300) INJECTION 61%
75.0000 mL | Freq: Once | INTRAVENOUS | Status: AC | PRN
  Administered 2016-01-16: 75 mL via INTRAVENOUS

## 2016-01-30 ENCOUNTER — Other Ambulatory Visit: Payer: Self-pay | Admitting: Acute Care

## 2016-01-30 DIAGNOSIS — Z87891 Personal history of nicotine dependence: Secondary | ICD-10-CM

## 2016-02-08 ENCOUNTER — Other Ambulatory Visit (HOSPITAL_COMMUNITY): Admission: RE | Admit: 2016-02-08 | Payer: Medicare Other | Source: Ambulatory Visit

## 2016-02-08 ENCOUNTER — Other Ambulatory Visit: Payer: Self-pay | Admitting: Radiology

## 2016-03-23 DIAGNOSIS — Z7189 Other specified counseling: Secondary | ICD-10-CM | POA: Diagnosis not present

## 2016-03-23 DIAGNOSIS — E559 Vitamin D deficiency, unspecified: Secondary | ICD-10-CM | POA: Diagnosis not present

## 2016-03-23 DIAGNOSIS — R69 Illness, unspecified: Secondary | ICD-10-CM | POA: Diagnosis not present

## 2016-03-23 DIAGNOSIS — G629 Polyneuropathy, unspecified: Secondary | ICD-10-CM | POA: Diagnosis not present

## 2016-03-23 DIAGNOSIS — J449 Chronic obstructive pulmonary disease, unspecified: Secondary | ICD-10-CM | POA: Diagnosis not present

## 2016-03-23 DIAGNOSIS — I2699 Other pulmonary embolism without acute cor pulmonale: Secondary | ICD-10-CM | POA: Diagnosis not present

## 2016-03-23 DIAGNOSIS — I82409 Acute embolism and thrombosis of unspecified deep veins of unspecified lower extremity: Secondary | ICD-10-CM | POA: Diagnosis not present

## 2016-03-23 DIAGNOSIS — E669 Obesity, unspecified: Secondary | ICD-10-CM | POA: Diagnosis not present

## 2016-03-23 DIAGNOSIS — E039 Hypothyroidism, unspecified: Secondary | ICD-10-CM | POA: Diagnosis not present

## 2016-03-23 DIAGNOSIS — Z1389 Encounter for screening for other disorder: Secondary | ICD-10-CM | POA: Diagnosis not present

## 2016-03-23 DIAGNOSIS — I10 Essential (primary) hypertension: Secondary | ICD-10-CM | POA: Diagnosis not present

## 2016-03-23 DIAGNOSIS — I2510 Atherosclerotic heart disease of native coronary artery without angina pectoris: Secondary | ICD-10-CM | POA: Diagnosis not present

## 2016-03-23 DIAGNOSIS — Z0000 Encounter for general adult medical examination without abnormal findings: Secondary | ICD-10-CM | POA: Diagnosis not present

## 2016-04-16 DIAGNOSIS — I10 Essential (primary) hypertension: Secondary | ICD-10-CM | POA: Diagnosis not present

## 2016-04-16 DIAGNOSIS — K5900 Constipation, unspecified: Secondary | ICD-10-CM | POA: Diagnosis not present

## 2016-04-16 DIAGNOSIS — R69 Illness, unspecified: Secondary | ICD-10-CM | POA: Diagnosis not present

## 2016-04-16 DIAGNOSIS — G4730 Sleep apnea, unspecified: Secondary | ICD-10-CM | POA: Diagnosis not present

## 2016-04-16 DIAGNOSIS — L209 Atopic dermatitis, unspecified: Secondary | ICD-10-CM | POA: Diagnosis not present

## 2016-04-16 DIAGNOSIS — Z86718 Personal history of other venous thrombosis and embolism: Secondary | ICD-10-CM | POA: Diagnosis not present

## 2016-04-16 DIAGNOSIS — Z7901 Long term (current) use of anticoagulants: Secondary | ICD-10-CM | POA: Diagnosis not present

## 2016-04-16 DIAGNOSIS — Z9989 Dependence on other enabling machines and devices: Secondary | ICD-10-CM | POA: Diagnosis not present

## 2016-04-16 DIAGNOSIS — M159 Polyosteoarthritis, unspecified: Secondary | ICD-10-CM | POA: Diagnosis not present

## 2016-04-16 DIAGNOSIS — Z0000 Encounter for general adult medical examination without abnormal findings: Secondary | ICD-10-CM | POA: Diagnosis not present

## 2016-04-16 DIAGNOSIS — E669 Obesity, unspecified: Secondary | ICD-10-CM | POA: Diagnosis not present

## 2016-04-16 DIAGNOSIS — Z9181 History of falling: Secondary | ICD-10-CM | POA: Diagnosis not present

## 2016-04-16 DIAGNOSIS — Z87891 Personal history of nicotine dependence: Secondary | ICD-10-CM | POA: Diagnosis not present

## 2016-04-16 DIAGNOSIS — E782 Mixed hyperlipidemia: Secondary | ICD-10-CM | POA: Diagnosis not present

## 2016-04-16 DIAGNOSIS — J449 Chronic obstructive pulmonary disease, unspecified: Secondary | ICD-10-CM | POA: Diagnosis not present

## 2016-04-18 ENCOUNTER — Encounter (HOSPITAL_COMMUNITY): Payer: Self-pay | Admitting: Emergency Medicine

## 2016-04-18 ENCOUNTER — Emergency Department (HOSPITAL_COMMUNITY)
Admission: EM | Admit: 2016-04-18 | Discharge: 2016-04-18 | Disposition: A | Discharge status: Home / Self Care | Payer: Medicare HMO | Attending: Emergency Medicine | Admitting: Emergency Medicine

## 2016-04-18 ENCOUNTER — Emergency Department (HOSPITAL_COMMUNITY): Payer: Medicare HMO

## 2016-04-18 DIAGNOSIS — R112 Nausea with vomiting, unspecified: Secondary | ICD-10-CM | POA: Diagnosis present

## 2016-04-18 DIAGNOSIS — J449 Chronic obstructive pulmonary disease, unspecified: Secondary | ICD-10-CM | POA: Diagnosis not present

## 2016-04-18 DIAGNOSIS — Z87891 Personal history of nicotine dependence: Secondary | ICD-10-CM | POA: Insufficient documentation

## 2016-04-18 DIAGNOSIS — Z79899 Other long term (current) drug therapy: Secondary | ICD-10-CM | POA: Diagnosis not present

## 2016-04-18 DIAGNOSIS — M791 Myalgia: Secondary | ICD-10-CM | POA: Diagnosis not present

## 2016-04-18 DIAGNOSIS — R05 Cough: Secondary | ICD-10-CM | POA: Diagnosis not present

## 2016-04-18 DIAGNOSIS — J09X2 Influenza due to identified novel influenza A virus with other respiratory manifestations: Secondary | ICD-10-CM | POA: Diagnosis not present

## 2016-04-18 DIAGNOSIS — Z7902 Long term (current) use of antithrombotics/antiplatelets: Secondary | ICD-10-CM | POA: Insufficient documentation

## 2016-04-18 DIAGNOSIS — I10 Essential (primary) hypertension: Secondary | ICD-10-CM | POA: Insufficient documentation

## 2016-04-18 DIAGNOSIS — R0602 Shortness of breath: Secondary | ICD-10-CM | POA: Diagnosis not present

## 2016-04-18 DIAGNOSIS — J101 Influenza due to other identified influenza virus with other respiratory manifestations: Secondary | ICD-10-CM

## 2016-04-18 LAB — BASIC METABOLIC PANEL
ANION GAP: 8 (ref 5–15)
BUN: 16 mg/dL (ref 6–20)
CO2: 27 mmol/L (ref 22–32)
Calcium: 8.9 mg/dL (ref 8.9–10.3)
Chloride: 102 mmol/L (ref 101–111)
Creatinine, Ser: 1.06 mg/dL — ABNORMAL HIGH (ref 0.44–1.00)
GFR, EST AFRICAN AMERICAN: 57 mL/min — AB (ref >60)
GFR, EST NON AFRICAN AMERICAN: 49 mL/min — AB (ref >60)
Glucose, Bld: 101 mg/dL — ABNORMAL HIGH (ref 65–99)
POTASSIUM: 4.4 mmol/L (ref 3.5–5.1)
Sodium: 137 mmol/L (ref 135–145)

## 2016-04-18 LAB — INFLUENZA PANEL BY PCR (TYPE A & B)
INFLBPCR: NEGATIVE
Influenza A By PCR: POSITIVE — AB

## 2016-04-18 LAB — CBC
HCT: 35.9 % — ABNORMAL LOW (ref 36.0–46.0)
Hemoglobin: 12.2 g/dL (ref 12.0–15.0)
MCH: 32.7 pg (ref 26.0–34.0)
MCHC: 34 g/dL (ref 30.0–36.0)
MCV: 96.2 fL (ref 78.0–100.0)
PLATELETS: 211 10*3/uL (ref 150–400)
RBC: 3.73 MIL/uL — AB (ref 3.87–5.11)
RDW: 14.4 % (ref 11.5–15.5)
WBC: 7.8 10*3/uL (ref 4.0–10.5)

## 2016-04-18 LAB — I-STAT TROPONIN, ED: TROPONIN I, POC: 0 ng/mL (ref 0.00–0.08)

## 2016-04-18 LAB — I-STAT CG4 LACTIC ACID, ED: LACTIC ACID, VENOUS: 1.55 mmol/L (ref 0.5–1.9)

## 2016-04-18 MED ORDER — ACETAMINOPHEN 325 MG PO TABS
650.0000 mg | ORAL_TABLET | Freq: Once | ORAL | Status: AC
  Administered 2016-04-18: 650 mg via ORAL
  Filled 2016-04-18: qty 2

## 2016-04-18 MED ORDER — SODIUM CHLORIDE 0.9 % IV BOLUS (SEPSIS)
1000.0000 mL | Freq: Once | INTRAVENOUS | Status: AC
  Administered 2016-04-18: 1000 mL via INTRAVENOUS

## 2016-04-18 MED ORDER — OSELTAMIVIR PHOSPHATE 75 MG PO CAPS
75.0000 mg | ORAL_CAPSULE | Freq: Once | ORAL | Status: AC
  Administered 2016-04-18: 75 mg via ORAL
  Filled 2016-04-18: qty 1

## 2016-04-18 MED ORDER — OSELTAMIVIR PHOSPHATE 75 MG PO CAPS: 75.0000 mg | ORAL_CAPSULE | Freq: Two times a day (BID) | ORAL | 0 refills | Status: DC

## 2016-04-18 NOTE — ED Triage Notes (Signed)
Per EMS, N/V/D and body aches since yesterday-vomited once when she arrived to ED

## 2016-04-18 NOTE — ED Provider Notes (Signed)
[facility] DEPT Provider Note   CSN: [id number] Arrival date & time: 04/18/16  1516     History   Chief Complaint Chief Complaint  Patient presents with  . flu like symptoms    HPI Denise Cline is a 76 y.o. female.  HPI  76 y.o. female with a hx of COPD, HTN, presents to the Emergency Department today complaining of N/V/D with onset yesterday. Notes generalized body aches with associated CP/SOB. Mild abdominal discomfort. Has not attempted OTC medications. Notes no sick contacts. Fevers with TMax 100F yesterday. Pain 5/10 currently. No other symptoms noted.   Past Medical History:  Diagnosis Date  . Anxiety   . COPD (chronic obstructive pulmonary disease) ([place])   . Fibroid   . History of shingles   . Hypertension   . Thyroid disease    Nodules  . Varicose veins     Patient Active Problem List   Diagnosis Date Noted  . DVT (deep venous thrombosis) ([place]) 11/12/2015  . Pulmonary emboli ([place]) 11/11/2015  . Acute pulmonary embolism ([place]) 11/11/2015  . Coronary artery calcification seen on CAT scan 05/12/2015  . Thoracic aortic aneurysm ([place]) 05/12/2015  . Dyspnea 03/17/2015  . Left leg swelling 03/16/2015  . Acute colitis 10/04/2014  . COPD (chronic obstructive pulmonary disease) ([place])   . PVD (peripheral vascular disease) ([place]) 04/01/2012  . Leg pain 04/01/2012  . Swelling of limb 04/01/2012  . History of shingles   . Hypertension   . Fibroid   . Thyroid disease   . Anxiety     Past Surgical History:  Procedure Laterality Date  . ABDOMINAL HYSTERECTOMY    . COLONOSCOPY N/A 10/05/2014   Procedure: COLONOSCOPY;  Surgeon: Ronald Lobo, MD;  Location: [facility] END[facility]COPY;  Service: Endoscopy;  Laterality: N/A;  . OOPHORECTOMY     One ovary removed  . Thyroid nodules    . THYROID SURGERY      OB History    Gravida Para Term Preterm AB Living   3 3 3     3    SAB TAB Ectopic Multiple Live Births                   Home Medications    Prior to  Admission medications   Medication Sig Start Date End Date Taking? Authorizing Provider  acetaminophen (TYLENOL) 325 MG tablet Take 2 tablets (650 mg total) by mouth every 6 (six) hours. 11/14/15   Geradine Girt, DO  buPROPion (WELLBUTRIN SR) 150 MG 12 hr tablet Take 150 mg by mouth daily.    Historical Provider, MD  docusate sodium (COLACE) 100 MG capsule Take 1 capsule (100 mg total) by mouth 2 (two) times daily. 11/14/15   Geradine Girt, DO  fluticasone furoate-vilanterol (BREO ELLIPTA) 100-25 MCG/INH AEPB Inhale 1 puff into the lungs daily. 11/14/15   Geradine Girt, DO  methocarbamol (ROBAXIN) 500 MG tablet Take 1 tablet (500 mg total) by mouth every 6 (six) hours as needed for muscle spasms (neck pain). 11/14/15   Geradine Girt, DO  Multiple Vitamins-Minerals (CENTRUM SILVER ADULT 50+) TABS Take 1 tablet by mouth daily.    Historical Provider, MD  OVER THE COUNTER MEDICATION Take 1 capsule by mouth daily as needed (constipation.). Med Name: [place]     Historical Provider, MD  Rivaroxaban 15 & 20 MG TBPK Take as directed on package: Start with one 15mg  tablet by mouth twice a day with food. On Day 22, switch to one  20mg  tablet once a day with food. 11/14/15   Geradine Girt, DO  traMADol (ULTRAM) 50 MG tablet Take 1 tablet (50 mg total) by mouth every 6 (six) hours as needed for moderate pain. 08/23/15   Merrily Pew, MD  valsartan-hydrochlorothiazide (DIOVAN-HCT) 160-25 MG per tablet Take 1 tablet by mouth daily.    Historical Provider, MD    Family History Family History  Problem Relation Age of Onset  . Crohn's disease Mother   . Stroke Father   . Diabetes Sister   . Hypertension Sister   . Hypertension Brother     Social History Social History  Substance Use Topics  . Smoking status: Former Smoker    Packs/day: 2.00    Years: 40.00    Types: Cigarettes    Quit date: 05/30/2008  . Smokeless tobacco: Former Systems developer    Quit date: 05/12/2008     Comment: Counseled to remain smoke  free  . Alcohol use No     Comment: Rare     Allergies   Penicillins; Sulfa antibiotics; and Oxycodone   Review of Systems Review of Systems ROS reviewed and all are negative for acute change except as noted in the HPI.  Physical Exam Updated Vital Signs BP 118/76 (BP Location: Left Arm)   Pulse 108   Temp 100.8 F (38.2 C) (Oral)   Resp 18   SpO2 95%   Physical Exam  Constitutional: She is oriented to person, place, and time. Vital signs are normal. She appears well-developed and well-nourished. No distress.  HENT:  Head: Normocephalic and atraumatic.  Right Ear: Hearing, tympanic membrane, external ear and ear canal normal.  Left Ear: Hearing, tympanic membrane, external ear and ear canal normal.  Nose: Nose normal.  Mouth/Throat: Uvula is midline, oropharynx is clear and moist and mucous membranes are normal. No trismus in the jaw. No oropharyngeal exudate, posterior oropharyngeal erythema or tonsillar abscesses.  Eyes: Conjunctivae and EOM are normal. Pupils are equal, round, and reactive to light.  Neck: Normal range of motion. Neck supple. No tracheal deviation present.  Cardiovascular: Regular rhythm, S1 normal, S2 normal, normal heart sounds, intact distal pulses and normal pulses.  Tachycardia present.   Pulmonary/Chest: Effort normal and breath sounds normal. No respiratory distress. She has no decreased breath sounds. She has no wheezes. She has no rhonchi. She has no rales.  Abdominal: Soft. Normal appearance and bowel sounds are normal. There is no tenderness.  Musculoskeletal: Normal range of motion.  Neurological: She is alert and oriented to person, place, and time.  Skin: Skin is warm and dry.  Psychiatric: She has a normal mood and affect. Her speech is normal and behavior is normal. Thought content normal.  Nursing note and vitals reviewed.  ED Treatments / Results  Labs (all labs ordered are listed, but only abnormal results are displayed) Labs  Reviewed  CBC - Abnormal; Notable for the following:       Result Value   RBC 3.73 (*)    HCT 35.9 (*)    All other components within normal limits  BASIC METABOLIC PANEL - Abnormal; Notable for the following:    Glucose, Bld 101 (*)    Creatinine, Ser 1.06 (*)    GFR calc non Af Amer 49 (*)    GFR calc Af Amer 57 (*)    All other components within normal limits  INFLUENZA PANEL BY PCR (TYPE A & B) - Abnormal; Notable for the following:    Influenza A  By PCR POSITIVE (*)    All other components within normal limits  I-STAT CG4 LACTIC ACID, ED  Randolm Idol, ED   EKG  EKG Interpretation None      Radiology Dg Chest 2 View  Result Date: 04/18/2016 CLINICAL DATA:  Shortness of breath and cough EXAM: CHEST  2 VIEW COMPARISON:  Chest CT 01/16/2016 FINDINGS: Cardiac silhouette is enlarged. There is no overt pulmonary edema. The costophrenic angles are obscured from view by positioning, but there is no sizable pleural effusion. No focal airspace consolidation. IMPRESSION: Cardiomegaly without overt pulmonary edema. Electronically Signed   By: Ulyses Jarred M.D.   On: 04/18/2016 19:50    Procedures Procedures (including critical care time)  Medications Ordered in ED Medications  sodium chloride 0.9 % bolus 1,000 mL (1,000 mLs Intravenous New Bag/Given 04/18/16 2033)  acetaminophen (TYLENOL) tablet 650 mg (650 mg Oral Given 04/18/16 2004)   Initial Impression / Assessment and Plan / ED Course  I have reviewed the triage vital signs and the nursing notes.  Pertinent labs & imaging results that were available during my care of the patient were reviewed by me and considered in my medical decision making (see chart for details).  Final Clinical Impressions(s) / ED Diagnoses  {I have reviewed and evaluated the relevant laboratory values. {I have reviewed and evaluated the relevant imaging studies. {I have interpreted the relevant EKG. {I have reviewed the relevant previous healthcare  records.  {I obtained HPI from historian. {Patient discussed with supervising physician.  ED Course:  Assessment: Pt is a 56yF with hx COPD, HTN who presents with URi symptoms with N/V/D and fever last night. On exam, pt in Nontoxic/nonseptic appearing. VS with tachycardia. Temp 100.72F. Lungs CTA. Heart RRR. Abdomen nontender soft. iStat Lactate 1.55. WBC 7.8. Influenza drawn. Trop negative. Flu Positive for Influenza A.  Treated with Tamiflu. CXR negative. DC home with close follow up to PCP. Strict return precautions given.   Disposition/Plan:  DC Home Additional Verbal discharge instructions given and discussed with patient.  Pt Instructed to f/u with PCP in the next week for evaluation and treatment of symptoms. Return precautions given Pt acknowledges and agrees with plan  Supervising Physician Gwenyth Allegra Tegeler, MD  Final diagnoses:  Influenza A    New Prescriptions New Prescriptions   No medications on file     Shary Decamp, PA-C 04/18/16 2144    Gwenyth Allegra Tegeler, MD 04/19/16 [phone]

## 2016-04-18 NOTE — ED Notes (Signed)
PT DISCHARGED. INSTRUCTIONS AND PRESCRIPTIONS GIVEN. AAOX4. PT IN NO APPARENT DISTRESS. THE OPPORTUNITY TO ASK QUESTIONS WAS PROVIDED. 

## 2016-04-18 NOTE — Discharge Instructions (Signed)
Please read and follow all provided instructions.  Your diagnoses today include:  1. Influenza A     Tests performed today include: Vital signs. See below for your results today.   Medications prescribed:   Take any prescribed medications only as directed.  Home care instructions:  Follow any educational materials contained in this packet. Please continue drinking plenty of fluids. Use over-the-counter cold and flu medications as needed as directed on packaging for symptom relief. You may also use ibuprofen or tylenol as directed on packaging for pain or fever.   BE VERY CAREFUL not to take multiple medicines containing Tylenol (also called acetaminophen). Doing so can lead to an overdose which can damage your liver and cause liver failure and possibly death.   Follow-up instructions: Please follow-up with your primary care provider in the next 3 days for further evaluation of your symptoms.   Return instructions:  Please return to the Emergency Department if you experience worsening symptoms. Please return if you have a high fever greater than 101 degrees not controlled with over-the-counter medications, persistent vomiting and cannot keep down fluids, or worsening trouble breathing. Please return if you have any other emergent concerns.  Additional Information:  Your vital signs today were: BP 133/82    Pulse 91    Temp 102.4 F (39.1 C) (Oral)    Resp 21    Ht 5\' 6"  (1.676 m)    Wt 91.6 kg    SpO2 100%    BMI 32.60 kg/m  If your blood pressure (BP) was elevated above 135/85 this visit, please have this repeated by your doctor within one month.

## 2016-05-18 DIAGNOSIS — M7542 Impingement syndrome of left shoulder: Secondary | ICD-10-CM | POA: Diagnosis not present

## 2016-06-12 ENCOUNTER — Emergency Department (HOSPITAL_COMMUNITY): Payer: Medicare HMO

## 2016-06-12 ENCOUNTER — Emergency Department (HOSPITAL_COMMUNITY)
Admission: EM | Admit: 2016-06-12 | Discharge: 2016-06-13 | Disposition: A | Discharge status: Home / Self Care | Payer: Medicare HMO | Attending: Emergency Medicine | Admitting: Emergency Medicine

## 2016-06-12 ENCOUNTER — Encounter (HOSPITAL_COMMUNITY): Payer: Self-pay | Admitting: Emergency Medicine

## 2016-06-12 DIAGNOSIS — N3000 Acute cystitis without hematuria: Secondary | ICD-10-CM | POA: Insufficient documentation

## 2016-06-12 DIAGNOSIS — R0602 Shortness of breath: Secondary | ICD-10-CM | POA: Diagnosis not present

## 2016-06-12 DIAGNOSIS — Z7902 Long term (current) use of antithrombotics/antiplatelets: Secondary | ICD-10-CM | POA: Diagnosis not present

## 2016-06-12 DIAGNOSIS — I10 Essential (primary) hypertension: Secondary | ICD-10-CM | POA: Diagnosis not present

## 2016-06-12 DIAGNOSIS — J449 Chronic obstructive pulmonary disease, unspecified: Secondary | ICD-10-CM | POA: Insufficient documentation

## 2016-06-12 DIAGNOSIS — Z79899 Other long term (current) drug therapy: Secondary | ICD-10-CM | POA: Insufficient documentation

## 2016-06-12 DIAGNOSIS — R509 Fever, unspecified: Secondary | ICD-10-CM | POA: Diagnosis present

## 2016-06-12 DIAGNOSIS — Z87891 Personal history of nicotine dependence: Secondary | ICD-10-CM | POA: Insufficient documentation

## 2016-06-12 LAB — URINALYSIS, MICROSCOPIC (REFLEX): RBC / HPF: NONE SEEN RBC/hpf (ref 0–5)

## 2016-06-12 LAB — COMPREHENSIVE METABOLIC PANEL
ALBUMIN: 3.2 g/dL — AB (ref 3.5–5.0)
ALK PHOS: 71 U/L (ref 38–126)
ALT: 43 U/L (ref 14–54)
AST: 31 U/L (ref 15–41)
Anion gap: 8 (ref 5–15)
BILIRUBIN TOTAL: 0.4 mg/dL (ref 0.3–1.2)
BUN: 22 mg/dL — AB (ref 6–20)
CALCIUM: 9 mg/dL (ref 8.9–10.3)
CO2: 31 mmol/L (ref 22–32)
Chloride: 99 mmol/L — ABNORMAL LOW (ref 101–111)
Creatinine, Ser: 1.1 mg/dL — ABNORMAL HIGH (ref 0.44–1.00)
GFR calc Af Amer: 55 mL/min — ABNORMAL LOW (ref >60)
GFR calc non Af Amer: 47 mL/min — ABNORMAL LOW (ref >60)
GLUCOSE: 89 mg/dL (ref 65–99)
Potassium: 3.8 mmol/L (ref 3.5–5.1)
Sodium: 138 mmol/L (ref 135–145)
TOTAL PROTEIN: 7.6 g/dL (ref 6.5–8.1)

## 2016-06-12 LAB — CBC WITH DIFFERENTIAL/PLATELET
BASOS ABS: 0 10*3/uL (ref 0.0–0.1)
BASOS PCT: 0 %
EOS PCT: 2 %
Eosinophils Absolute: 0.1 10*3/uL (ref 0.0–0.7)
HCT: 33.3 % — ABNORMAL LOW (ref 36.0–46.0)
Hemoglobin: 11.2 g/dL — ABNORMAL LOW (ref 12.0–15.0)
LYMPHS PCT: 17 %
Lymphs Abs: 1.5 10*3/uL (ref 0.7–4.0)
MCH: 31.4 pg (ref 26.0–34.0)
MCHC: 33.6 g/dL (ref 30.0–36.0)
MCV: 93.3 fL (ref 78.0–100.0)
MONO ABS: 0.6 10*3/uL (ref 0.1–1.0)
MONOS PCT: 7 %
Neutro Abs: 6.5 10*3/uL (ref 1.7–7.7)
Neutrophils Relative %: 74 %
PLATELETS: 412 10*3/uL — AB (ref 150–400)
RBC: 3.57 MIL/uL — ABNORMAL LOW (ref 3.87–5.11)
RDW: 13.6 % (ref 11.5–15.5)
WBC: 8.7 10*3/uL (ref 4.0–10.5)

## 2016-06-12 LAB — URINALYSIS, ROUTINE W REFLEX MICROSCOPIC
Bilirubin Urine: NEGATIVE
GLUCOSE, UA: NEGATIVE mg/dL
HGB URINE DIPSTICK: NEGATIVE
Ketones, ur: NEGATIVE mg/dL
Nitrite: POSITIVE — AB
PH: 6 (ref 5.0–8.0)
Protein, ur: NEGATIVE mg/dL

## 2016-06-12 LAB — I-STAT CG4 LACTIC ACID, ED: Lactic Acid, Venous: 0.79 mmol/L (ref 0.5–1.9)

## 2016-06-12 NOTE — ED Provider Notes (Signed)
[name] DEPT Provider Note   CSN: [id number] Arrival date & time: 06/12/16  2106   By signing my name below, I, Eunice Blase, attest that this documentation has been prepared under the direction and in the presence of Jola Schmidt, MD. Electronically signed, Eunice Blase, ED Scribe. 06/12/16. 12:14 AM.  History   Chief Complaint Chief Complaint  Patient presents with  . Generalized Body Aches  . Fever   The history is provided by the patient, medical records and a relative. No language interpreter was used.    Denise Cline is a 76 y.o. female with h/o blood clots in L lung and L leg, HTN, PVD, COPD, DVT and varicose veins transported by her son to the Emergency Department with concern for moderate, episodic L chest pain onset while attempting to urinate yesterday. She notes this pain has subsided without intervention; no modifying factors noted. She adds inferior R shoulder pain, fever (tMax 102.9) 45 days ago that has subsided, urgency, decreased urination, fatigue, voice change, dry mouth, decreased appetite, cough and decreased bowel movements. Pt taking xarelto for blood clots and followed by Dr. Inda Merlin for this issue. No other complaints at this time.  Past Medical History:  Diagnosis Date  . Anxiety   . COPD (chronic obstructive pulmonary disease) ([place])   . Fibroid   . History of shingles   . Hypertension   . Thyroid disease    Nodules  . Varicose veins     Patient Active Problem List   Diagnosis Date Noted  . DVT (deep venous thrombosis) ([place]) 11/12/2015  . Pulmonary emboli ([place]) 11/11/2015  . Acute pulmonary embolism ([place]) 11/11/2015  . Coronary artery calcification seen on CAT scan 05/12/2015  . Thoracic aortic aneurysm ([place]) 05/12/2015  . Dyspnea 03/17/2015  . Left leg swelling 03/16/2015  . Acute colitis 10/04/2014  . COPD (chronic obstructive pulmonary disease) ([place])   . PVD (peripheral vascular disease) ([place]) 04/01/2012  . Leg pain 04/01/2012    . Swelling of limb 04/01/2012  . History of shingles   . Hypertension   . Fibroid   . Thyroid disease   . Anxiety     Past Surgical History:  Procedure Laterality Date  . ABDOMINAL HYSTERECTOMY    . COLONOSCOPY N/A 10/05/2014   Procedure: COLONOSCOPY;  Surgeon: Ronald Lobo, MD;  Location: [facility];  Service: Endoscopy;  Laterality: N/A;  . OOPHORECTOMY     One ovary removed  . Thyroid nodules    . THYROID SURGERY      OB History    Gravida Para Term Preterm AB Living   3 3 3     3    SAB TAB Ectopic Multiple Live Births                   Home Medications    Prior to Admission medications   Medication Sig Start Date End Date Taking? Authorizing Provider  acetaminophen (TYLENOL) 325 MG tablet Take 2 tablets (650 mg total) by mouth every 6 (six) hours. 11/14/15  Yes Geradine Girt, DO  buPROPion (WELLBUTRIN SR) 150 MG 12 hr tablet Take 150 mg by mouth daily.   Yes Historical Provider, MD  docusate sodium (COLACE) 100 MG capsule Take 1 capsule (100 mg total) by mouth 2 (two) times daily. 11/14/15  Yes Jessica U Vann, DO  fluticasone furoate-vilanterol (BREO ELLIPTA) 100-25 MCG/INH AEPB Inhale 1 puff into the lungs daily. 11/14/15  Yes Geradine Girt, DO  Multiple Vitamins-Minerals (CENTRUM SILVER ADULT  50+) TABS Take 1 tablet by mouth daily.   Yes Historical Provider, MD  Rivaroxaban 15 & 20 MG TBPK Take as directed on package: Start with one 15mg  tablet by mouth twice a day with food. On Day 22, switch to one 20mg  tablet once a day with food. Patient taking differently: Take 20 mg by mouth daily. Pt has been taking 20mg  since beginning of October 2017. 11/14/15  Yes Geradine Girt, DO  traMADol (ULTRAM) 50 MG tablet Take 1 tablet (50 mg total) by mouth every 6 (six) hours as needed for moderate pain. 08/23/15  Yes Merrily Pew, MD  valsartan-hydrochlorothiazide (DIOVAN-HCT) 160-25 MG per tablet Take 1 tablet by mouth daily.   Yes Historical Provider, MD  methocarbamol (ROBAXIN)  500 MG tablet Take 1 tablet (500 mg total) by mouth every 6 (six) hours as needed for muscle spasms (neck pain). Patient not taking: Reported on 04/18/2016 11/14/15   Geradine Girt, DO  oseltamivir (TAMIFLU) 75 MG capsule Take 1 capsule (75 mg total) by mouth every 12 (twelve) hours. Patient not taking: Reported on 06/12/2016 04/18/16   Shary Decamp, PA-C    Family History Family History  Problem Relation Age of Onset  . Crohn's disease Mother   . Stroke Father   . Diabetes Sister   . Hypertension Sister   . Hypertension Brother     Social History Social History  Substance Use Topics  . Smoking status: Former Smoker    Packs/day: 2.00    Years: 40.00    Types: Cigarettes    Quit date: 05/30/2008  . Smokeless tobacco: Former Systems developer    Quit date: 05/12/2008     Comment: Counseled to remain smoke free  . Alcohol use No     Comment: Rare     Allergies   Penicillins; Sulfa antibiotics; and Oxycodone   Review of Systems Review of Systems  All other systems reviewed and all systems are negative for acute changes except as noted in the HPI and PMH.      Physical Exam Updated Vital Signs BP 124/82 (BP Location: Left Arm)   Pulse 73   Temp 98.1 F (36.7 C) (Oral)   Resp 18   Ht 5\' 6"  (1.676 m)   Wt 200 lb (90.7 kg)   SpO2 100%   BMI 32.28 kg/m   Physical Exam  Constitutional: She is oriented to person, place, and time. She appears well-developed and well-nourished. No distress.  HENT:  Head: Normocephalic and atraumatic.  Eyes: EOM are normal.  Neck: Normal range of motion.  Cardiovascular: Normal rate, regular rhythm and normal heart sounds.   Pulmonary/Chest: Effort normal and breath sounds normal.  Abdominal: Soft. She exhibits no distension. There is tenderness (mild) in the suprapubic area.  Musculoskeletal: Normal range of motion.  Neurological: She is alert and oriented to person, place, and time.  Skin: Skin is warm and dry.  Psychiatric: She has a normal mood  and affect. Judgment normal.  Nursing note and vitals reviewed.    ED Treatments / Results  DIAGNOSTIC STUDIES: Oxygen Saturation is 100% on RA, normal by my interpretation.    COORDINATION OF CARE: 12:11 AM Discussed treatment plan with pt at bedside and pt agreed to plan. Will order abx, Pt given F/U precautions.  Labs (all labs ordered are listed, but only abnormal results are displayed) Labs Reviewed  COMPREHENSIVE METABOLIC PANEL - Abnormal; Notable for the following:       Result Value   Chloride 99 (*)  BUN 22 (*)    Creatinine, Ser 1.10 (*)    Albumin 3.2 (*)    GFR calc non Af Amer 47 (*)    GFR calc Af Amer 55 (*)    All other components within normal limits  CBC WITH DIFFERENTIAL/PLATELET - Abnormal; Notable for the following:    RBC 3.57 (*)    Hemoglobin 11.2 (*)    HCT 33.3 (*)    Platelets 412 (*)    All other components within normal limits  URINALYSIS, ROUTINE W REFLEX MICROSCOPIC - Abnormal; Notable for the following:    APPearance CLOUDY (*)    Specific Gravity, Urine <1.005 (*)    Nitrite POSITIVE (*)    Leukocytes, UA SMALL (*)    All other components within normal limits  URINALYSIS, MICROSCOPIC (REFLEX) - Abnormal; Notable for the following:    Bacteria, UA MANY (*)    Squamous Epithelial / LPF 6-30 (*)    All other components within normal limits  URINE CULTURE  I-STAT CG4 LACTIC ACID, ED  I-STAT CG4 LACTIC ACID, ED    EKG  EKG Interpretation  Date/Time:  Tuesday June 12 2016 21:41:47 EDT Ventricular Rate:  104 PR Interval:    QRS Duration: 77 QT Interval:  373 QTC Calculation: 417 R Axis:   6 Text Interpretation:  Sinus tachycardia Ventricular premature complex Probable left atrial enlargement Low voltage, precordial leads Abnormal R-wave progression, early transition No significant change was found Confirmed by Ramandeep Arington  MD, Lennette Bihari ([id number]) on 06/13/2016 12:11:47 AM       Radiology Dg Chest 2 View  Result Date: 06/12/2016 CLINICAL  DATA:  Fever, weakness, shortness of breath and dizziness for 4 days. History of COPD. EXAM: CHEST  2 VIEW COMPARISON:  Chest radiograph April 18, 2016 FINDINGS: Cardiac silhouette is mildly enlarged and unchanged. Mediastinal silhouette is nonsuspicious. Pulmonary vasculature appears normal. No pleural effusion. Similar strandy densities LEFT lung base. No no pneumothorax. Soft tissue planes and included osseous structures are nonsuspicious, mild degenerative change of the thoracic spine. IMPRESSION: Stable cardiomegaly.  LEFT lung base atelectasis/ scarring. Electronically Signed   By: Elon Alas M.D.   On: 06/12/2016 22:45    Procedures Procedures (including critical care time)  Medications Ordered in ED Medications  cephALEXin (KEFLEX) capsule 500 mg (500 mg Oral Given 06/13/16 0030)     Initial Impression / Assessment and Plan / ED Course  I have reviewed the triage vital signs and the nursing notes.  Pertinent labs & imaging results that were available during my care of the patient were reviewed by me and considered in my medical decision making (see chart for details).     Patient is overall well-appearing.  Labs x-ray without significant abnormality.  Rather normal examination.  Patient be treated for urinary tract infection.  Primary care follow-up.  Final Clinical Impressions(s) / ED Diagnoses   Final diagnoses:  Acute cystitis without hematuria    New Prescriptions New Prescriptions   CEPHALEXIN (KEFLEX) 500 MG CAPSULE    Take 1 capsule (500 mg total) by mouth 3 (three) times daily.  I personally performed the services described in this documentation, which was scribed in my presence. The recorded information has been reviewed and is accurate.        Jola Schmidt, MD 06/13/16 [phone]

## 2016-06-12 NOTE — ED Triage Notes (Signed)
Pt comes with complaints of generalized body aches and fever since Friday.  Highest fever reported 102.9. Has been afebrile since.  Pt also states she at one point she had a sharp pain in the back of her right shoulder that has since resolved.  Also states she was diagnosed with a blood clot in her left lung in the beginning of February and has been on a blood thinner since.  Pt A&O x4.

## 2016-06-12 NOTE — ED Notes (Signed)
Pt is c/o a dry  Cough  and states that she has not been urinating very much

## 2016-06-13 DIAGNOSIS — J449 Chronic obstructive pulmonary disease, unspecified: Secondary | ICD-10-CM | POA: Diagnosis not present

## 2016-06-13 DIAGNOSIS — Z7902 Long term (current) use of antithrombotics/antiplatelets: Secondary | ICD-10-CM | POA: Diagnosis not present

## 2016-06-13 DIAGNOSIS — I10 Essential (primary) hypertension: Secondary | ICD-10-CM | POA: Diagnosis not present

## 2016-06-13 DIAGNOSIS — Z79899 Other long term (current) drug therapy: Secondary | ICD-10-CM | POA: Diagnosis not present

## 2016-06-13 DIAGNOSIS — Z87891 Personal history of nicotine dependence: Secondary | ICD-10-CM | POA: Diagnosis not present

## 2016-06-13 DIAGNOSIS — N3000 Acute cystitis without hematuria: Secondary | ICD-10-CM | POA: Diagnosis not present

## 2016-06-13 MED ORDER — CEPHALEXIN 500 MG PO CAPS: 500.0000 mg | ORAL_CAPSULE | Freq: Three times a day (TID) | ORAL | 0 refills | Status: DC

## 2016-06-13 MED ORDER — CEPHALEXIN 500 MG PO CAPS
500.0000 mg | ORAL_CAPSULE | Freq: Once | ORAL | Status: AC
  Administered 2016-06-13: 500 mg via ORAL
  Filled 2016-06-13: qty 1

## 2016-06-13 NOTE — ED Notes (Signed)
Bladder scan = 20cc

## 2016-06-15 LAB — URINE CULTURE: Culture: >=100000 — AB

## 2016-06-16 ENCOUNTER — Telehealth: Payer: Self-pay

## 2016-06-16 NOTE — Telephone Encounter (Signed)
Post ED Visit - Positive Culture Follow-up  Culture report reviewed by antimicrobial stewardship pharmacist:  []  Elenor Quinones, Pharm.D. []  Heide Guile, Pharm.D., BCPS AQ-ID []  Parks Neptune, Pharm.D., BCPS []  Alycia Rossetti, Pharm.D., BCPS []  [place], Pharm.D., BCPS, AAHIVP []  Legrand Como, Pharm.D., BCPS, AAHIVP []  Salome Arnt, PharmD, BCPS [x]  Dimitri Ped, PharmD, BCPS []  Vincenza Hews, PharmD, BCPS  Positive urine culture Treated with Cephalexin, organism sensitive to the same and no further patient follow-up is required at this time.  Genia Del 06/16/2016, 9:47 AM

## 2016-06-20 DIAGNOSIS — G629 Polyneuropathy, unspecified: Secondary | ICD-10-CM | POA: Diagnosis not present

## 2016-06-20 DIAGNOSIS — E039 Hypothyroidism, unspecified: Secondary | ICD-10-CM | POA: Diagnosis not present

## 2016-06-20 DIAGNOSIS — R69 Illness, unspecified: Secondary | ICD-10-CM | POA: Diagnosis not present

## 2016-06-20 DIAGNOSIS — I2699 Other pulmonary embolism without acute cor pulmonale: Secondary | ICD-10-CM | POA: Diagnosis not present

## 2016-06-20 DIAGNOSIS — N390 Urinary tract infection, site not specified: Secondary | ICD-10-CM | POA: Diagnosis not present

## 2016-06-20 DIAGNOSIS — I10 Essential (primary) hypertension: Secondary | ICD-10-CM | POA: Diagnosis not present

## 2016-06-20 DIAGNOSIS — I82409 Acute embolism and thrombosis of unspecified deep veins of unspecified lower extremity: Secondary | ICD-10-CM | POA: Diagnosis not present

## 2016-06-20 DIAGNOSIS — I2510 Atherosclerotic heart disease of native coronary artery without angina pectoris: Secondary | ICD-10-CM | POA: Diagnosis not present

## 2016-06-20 DIAGNOSIS — K219 Gastro-esophageal reflux disease without esophagitis: Secondary | ICD-10-CM | POA: Diagnosis not present

## 2016-11-27 DIAGNOSIS — Z01419 Encounter for gynecological examination (general) (routine) without abnormal findings: Secondary | ICD-10-CM | POA: Diagnosis not present

## 2016-11-27 DIAGNOSIS — N898 Other specified noninflammatory disorders of vagina: Secondary | ICD-10-CM | POA: Diagnosis not present

## 2016-11-27 DIAGNOSIS — R350 Frequency of micturition: Secondary | ICD-10-CM | POA: Diagnosis not present

## 2016-12-17 DIAGNOSIS — R69 Illness, unspecified: Secondary | ICD-10-CM | POA: Diagnosis not present

## 2016-12-17 DIAGNOSIS — L309 Dermatitis, unspecified: Secondary | ICD-10-CM | POA: Diagnosis not present

## 2016-12-17 DIAGNOSIS — I10 Essential (primary) hypertension: Secondary | ICD-10-CM | POA: Diagnosis not present

## 2016-12-17 DIAGNOSIS — I2510 Atherosclerotic heart disease of native coronary artery without angina pectoris: Secondary | ICD-10-CM | POA: Diagnosis not present

## 2016-12-17 DIAGNOSIS — I82409 Acute embolism and thrombosis of unspecified deep veins of unspecified lower extremity: Secondary | ICD-10-CM | POA: Diagnosis not present

## 2016-12-17 DIAGNOSIS — I2699 Other pulmonary embolism without acute cor pulmonale: Secondary | ICD-10-CM | POA: Diagnosis not present

## 2016-12-17 DIAGNOSIS — E559 Vitamin D deficiency, unspecified: Secondary | ICD-10-CM | POA: Diagnosis not present

## 2016-12-17 DIAGNOSIS — G629 Polyneuropathy, unspecified: Secondary | ICD-10-CM | POA: Diagnosis not present

## 2016-12-17 DIAGNOSIS — Z23 Encounter for immunization: Secondary | ICD-10-CM | POA: Diagnosis not present

## 2016-12-17 DIAGNOSIS — M25562 Pain in left knee: Secondary | ICD-10-CM | POA: Diagnosis not present

## 2016-12-26 DIAGNOSIS — R922 Inconclusive mammogram: Secondary | ICD-10-CM | POA: Diagnosis not present

## 2016-12-26 DIAGNOSIS — Z09 Encounter for follow-up examination after completed treatment for conditions other than malignant neoplasm: Secondary | ICD-10-CM | POA: Diagnosis not present

## 2017-01-30 DIAGNOSIS — G4733 Obstructive sleep apnea (adult) (pediatric): Secondary | ICD-10-CM | POA: Diagnosis not present

## 2017-02-01 DIAGNOSIS — H04123 Dry eye syndrome of bilateral lacrimal glands: Secondary | ICD-10-CM | POA: Diagnosis not present

## 2017-03-04 DIAGNOSIS — J4521 Mild intermittent asthma with (acute) exacerbation: Secondary | ICD-10-CM | POA: Diagnosis not present

## 2017-03-04 DIAGNOSIS — M179 Osteoarthritis of knee, unspecified: Secondary | ICD-10-CM | POA: Diagnosis not present

## 2017-03-04 DIAGNOSIS — E039 Hypothyroidism, unspecified: Secondary | ICD-10-CM | POA: Diagnosis not present

## 2017-03-04 DIAGNOSIS — R69 Illness, unspecified: Secondary | ICD-10-CM | POA: Diagnosis not present

## 2017-03-04 DIAGNOSIS — J449 Chronic obstructive pulmonary disease, unspecified: Secondary | ICD-10-CM | POA: Diagnosis not present

## 2017-03-04 DIAGNOSIS — I2510 Atherosclerotic heart disease of native coronary artery without angina pectoris: Secondary | ICD-10-CM | POA: Diagnosis not present

## 2017-03-04 DIAGNOSIS — M169 Osteoarthritis of hip, unspecified: Secondary | ICD-10-CM | POA: Diagnosis not present

## 2017-03-04 DIAGNOSIS — E782 Mixed hyperlipidemia: Secondary | ICD-10-CM | POA: Diagnosis not present

## 2017-03-04 DIAGNOSIS — I10 Essential (primary) hypertension: Secondary | ICD-10-CM | POA: Diagnosis not present

## 2017-03-28 DIAGNOSIS — R69 Illness, unspecified: Secondary | ICD-10-CM | POA: Diagnosis not present

## 2017-03-28 DIAGNOSIS — Z1389 Encounter for screening for other disorder: Secondary | ICD-10-CM | POA: Diagnosis not present

## 2017-03-28 DIAGNOSIS — I2699 Other pulmonary embolism without acute cor pulmonale: Secondary | ICD-10-CM | POA: Diagnosis not present

## 2017-03-28 DIAGNOSIS — I10 Essential (primary) hypertension: Secondary | ICD-10-CM | POA: Diagnosis not present

## 2017-03-28 DIAGNOSIS — E782 Mixed hyperlipidemia: Secondary | ICD-10-CM | POA: Diagnosis not present

## 2017-03-28 DIAGNOSIS — J449 Chronic obstructive pulmonary disease, unspecified: Secondary | ICD-10-CM | POA: Diagnosis not present

## 2017-03-28 DIAGNOSIS — K5900 Constipation, unspecified: Secondary | ICD-10-CM | POA: Diagnosis not present

## 2017-03-28 DIAGNOSIS — M179 Osteoarthritis of knee, unspecified: Secondary | ICD-10-CM | POA: Diagnosis not present

## 2017-03-28 DIAGNOSIS — I2510 Atherosclerotic heart disease of native coronary artery without angina pectoris: Secondary | ICD-10-CM | POA: Diagnosis not present

## 2017-03-28 DIAGNOSIS — Z789 Other specified health status: Secondary | ICD-10-CM | POA: Diagnosis not present

## 2017-03-28 DIAGNOSIS — E559 Vitamin D deficiency, unspecified: Secondary | ICD-10-CM | POA: Diagnosis not present

## 2017-03-28 DIAGNOSIS — Z0000 Encounter for general adult medical examination without abnormal findings: Secondary | ICD-10-CM | POA: Diagnosis not present

## 2017-03-28 DIAGNOSIS — I82409 Acute embolism and thrombosis of unspecified deep veins of unspecified lower extremity: Secondary | ICD-10-CM | POA: Diagnosis not present

## 2017-03-28 DIAGNOSIS — M67919 Unspecified disorder of synovium and tendon, unspecified shoulder: Secondary | ICD-10-CM | POA: Diagnosis not present

## 2017-03-29 DIAGNOSIS — M169 Osteoarthritis of hip, unspecified: Secondary | ICD-10-CM | POA: Diagnosis not present

## 2017-03-29 DIAGNOSIS — M179 Osteoarthritis of knee, unspecified: Secondary | ICD-10-CM | POA: Diagnosis not present

## 2017-03-29 DIAGNOSIS — J449 Chronic obstructive pulmonary disease, unspecified: Secondary | ICD-10-CM | POA: Diagnosis not present

## 2017-03-29 DIAGNOSIS — I2510 Atherosclerotic heart disease of native coronary artery without angina pectoris: Secondary | ICD-10-CM | POA: Diagnosis not present

## 2017-03-29 DIAGNOSIS — E782 Mixed hyperlipidemia: Secondary | ICD-10-CM | POA: Diagnosis not present

## 2017-03-29 DIAGNOSIS — J4521 Mild intermittent asthma with (acute) exacerbation: Secondary | ICD-10-CM | POA: Diagnosis not present

## 2017-03-29 DIAGNOSIS — I10 Essential (primary) hypertension: Secondary | ICD-10-CM | POA: Diagnosis not present

## 2017-03-29 DIAGNOSIS — E039 Hypothyroidism, unspecified: Secondary | ICD-10-CM | POA: Diagnosis not present

## 2017-03-29 DIAGNOSIS — R69 Illness, unspecified: Secondary | ICD-10-CM | POA: Diagnosis not present

## 2017-04-03 ENCOUNTER — Emergency Department (HOSPITAL_COMMUNITY): Payer: Medicare HMO

## 2017-04-03 ENCOUNTER — Emergency Department (HOSPITAL_COMMUNITY)
Admission: EM | Admit: 2017-04-03 | Discharge: 2017-04-03 | Disposition: A | Discharge status: Home / Self Care | Payer: Medicare HMO | Attending: Emergency Medicine | Admitting: Emergency Medicine

## 2017-04-03 ENCOUNTER — Encounter (HOSPITAL_COMMUNITY): Payer: Self-pay | Admitting: Emergency Medicine

## 2017-04-03 DIAGNOSIS — Z7901 Long term (current) use of anticoagulants: Secondary | ICD-10-CM | POA: Diagnosis not present

## 2017-04-03 DIAGNOSIS — Z79899 Other long term (current) drug therapy: Secondary | ICD-10-CM | POA: Diagnosis not present

## 2017-04-03 DIAGNOSIS — H539 Unspecified visual disturbance: Secondary | ICD-10-CM | POA: Diagnosis present

## 2017-04-03 DIAGNOSIS — H532 Diplopia: Secondary | ICD-10-CM | POA: Insufficient documentation

## 2017-04-03 DIAGNOSIS — J449 Chronic obstructive pulmonary disease, unspecified: Secondary | ICD-10-CM | POA: Insufficient documentation

## 2017-04-03 DIAGNOSIS — I10 Essential (primary) hypertension: Secondary | ICD-10-CM | POA: Diagnosis not present

## 2017-04-03 DIAGNOSIS — H538 Other visual disturbances: Secondary | ICD-10-CM | POA: Diagnosis not present

## 2017-04-03 DIAGNOSIS — Z87891 Personal history of nicotine dependence: Secondary | ICD-10-CM | POA: Diagnosis not present

## 2017-04-03 DIAGNOSIS — D68318 Other hemorrhagic disorder due to intrinsic circulating anticoagulants, antibodies, or inhibitors: Secondary | ICD-10-CM | POA: Diagnosis not present

## 2017-04-03 LAB — COMPREHENSIVE METABOLIC PANEL
ALT: 20 U/L (ref 14–54)
AST: 19 U/L (ref 15–41)
Albumin: 4.1 g/dL (ref 3.5–5.0)
Alkaline Phosphatase: 47 U/L (ref 38–126)
Anion gap: 8 (ref 5–15)
BUN: 27 mg/dL — ABNORMAL HIGH (ref 6–20)
CHLORIDE: 103 mmol/L (ref 101–111)
CO2: 28 mmol/L (ref 22–32)
CREATININE: 1.01 mg/dL — AB (ref 0.44–1.00)
Calcium: 9.4 mg/dL (ref 8.9–10.3)
GFR, EST NON AFRICAN AMERICAN: 52 mL/min — AB (ref >60)
Glucose, Bld: 86 mg/dL (ref 65–99)
Potassium: 3.7 mmol/L (ref 3.5–5.1)
Sodium: 139 mmol/L (ref 135–145)
Total Bilirubin: 0.6 mg/dL (ref 0.3–1.2)
Total Protein: 8.1 g/dL (ref 6.5–8.1)

## 2017-04-03 LAB — URINALYSIS, ROUTINE W REFLEX MICROSCOPIC
Bacteria, UA: NONE SEEN
Bilirubin Urine: NEGATIVE
Glucose, UA: NEGATIVE mg/dL
HGB URINE DIPSTICK: NEGATIVE
Ketones, ur: NEGATIVE mg/dL
NITRITE: NEGATIVE
Protein, ur: NEGATIVE mg/dL
SPECIFIC GRAVITY, URINE: 1.019 (ref 1.005–1.030)
pH: 5 (ref 5.0–8.0)

## 2017-04-03 LAB — CBC WITH DIFFERENTIAL/PLATELET
Basophils Absolute: 0 10*3/uL (ref 0.0–0.1)
Basophils Relative: 0 %
EOS PCT: 1 %
Eosinophils Absolute: 0.1 10*3/uL (ref 0.0–0.7)
HCT: 37.1 % (ref 36.0–46.0)
Hemoglobin: 12.7 g/dL (ref 12.0–15.0)
LYMPHS ABS: 2.5 10*3/uL (ref 0.7–4.0)
LYMPHS PCT: 32 %
MCH: 32.8 pg (ref 26.0–34.0)
MCHC: 34.2 g/dL (ref 30.0–36.0)
MCV: 95.9 fL (ref 78.0–100.0)
MONOS PCT: 6 %
Monocytes Absolute: 0.4 10*3/uL (ref 0.1–1.0)
Neutro Abs: 4.7 10*3/uL (ref 1.7–7.7)
Neutrophils Relative %: 61 %
PLATELETS: 298 10*3/uL (ref 150–400)
RBC: 3.87 MIL/uL (ref 3.87–5.11)
RDW: 14.5 % (ref 11.5–15.5)
WBC: 7.7 10*3/uL (ref 4.0–10.5)

## 2017-04-03 NOTE — ED Triage Notes (Signed)
Patient c/o intermittent bilateral double vision x1 month. Reports being seen by opthamologist x2 and being dx with "dry eye." Denies headache and dizziness. Ambulatory.

## 2017-04-03 NOTE — ED Provider Notes (Signed)
[facility] DEPT Provider Note   CSN: [id number] Arrival date & time: 04/03/17  1138     History   Chief Complaint Chief Complaint  Patient presents with  . Diplopia    HPI ARYIA DELIRA is a 77 y.o. female.  The history is provided by the patient.  She has a history of hypertension, COPD, pulmonary embolism, thoracic aortic aneurysm and comes to the ED because of episodes of vision change.  The end of November, she had an episode of vertical diplopia.  She saw her ophthalmologist who stated that she had dry eyes.  Since then, she has had episodes where she feels pressure in the back of her eye and will have blurring of her entire visual field.  This will last a few minutes.  Today, she had an episode which lasted about 15 minutes.  She also has noticed times where she feels like she is getting off balance and she is about to fall, but has not fallen.  She is very concerned because a sibling had multiple falls and was found to have had strokes which were undiagnosed.  She denies any headache per se and is unable to put a number on the pressure she feels.  There is no weakness, numbness, tingling.  She is on long-term anticoagulation because of her prior episodes of DVT and pulmonary embolism.  She also relates that she was recently put back on CPAP at night.  Past Medical History:  Diagnosis Date  . Anxiety   . COPD (chronic obstructive pulmonary disease) ([place])   . Fibroid   . History of shingles   . Hypertension   . Thyroid disease    Nodules  . Varicose veins     Patient Active Problem List   Diagnosis Date Noted  . DVT (deep venous thrombosis) ([place]) 11/12/2015  . Pulmonary emboli ([place]) 11/11/2015  . Acute pulmonary embolism ([place]) 11/11/2015  . Coronary artery calcification seen on CAT scan 05/12/2015  . Thoracic aortic aneurysm ([place]) 05/12/2015  . Dyspnea 03/17/2015  . Left leg swelling 03/16/2015  . Acute colitis 10/04/2014  . COPD  (chronic obstructive pulmonary disease) ([place])   . PVD (peripheral vascular disease) ([place]) 04/01/2012  . Leg pain 04/01/2012  . Swelling of limb 04/01/2012  . History of shingles   . Hypertension   . Fibroid   . Thyroid disease   . Anxiety     Past Surgical History:  Procedure Laterality Date  . ABDOMINAL HYSTERECTOMY    . COLONOSCOPY N/A 10/05/2014   Procedure: COLONOSCOPY;  Surgeon: Ronald Lobo, MD;  Location: [facility] END[facility]COPY;  Service: Endoscopy;  Laterality: N/A;  . OOPHORECTOMY     One ovary removed  . Thyroid nodules    . THYROID SURGERY      OB History    Gravida Para Term Preterm AB Living   3 3 3     3    SAB TAB Ectopic Multiple Live Births                   Home Medications    Prior to Admission medications   Medication Sig Start Date End Date Taking? Authorizing Provider  acetaminophen (TYLENOL) 325 MG tablet Take 2 tablets (650 mg total) by mouth every 6 (six) hours. 11/14/15  Yes Geradine Girt, DO  Rivaroxaban 15 & 20 MG TBPK Take as directed on package: Start with one 15mg  tablet by mouth twice a day with food. On Day 22, switch to  one 20mg  tablet once a day with food. Patient taking differently: Take 20 mg by mouth daily. Pt has been taking 20mg  since beginning of October 2017. 11/14/15  Yes Vann, Tomi Bamberger, DO  buPROPion [employer] SR) 150 MG 12 hr tablet Take 150 mg by mouth daily.    [provider]  cephALEXin (KEFLEX) 500 MG capsule Take 1 capsule (500 mg total) by mouth 3 (three) times daily. Patient not taking: Reported on 04/03/2017 06/13/16   Jola Schmidt, MD  docusate sodium (COLACE) 100 MG capsule Take 1 capsule (100 mg total) by mouth 2 (two) times daily. Patient not taking: Reported on 04/03/2017 11/14/15   Eulogio Bear U, DO  fluticasone furoate-vilanterol (BREO ELLIPTA) 100-25 MCG/INH AEPB Inhale 1 puff into the lungs daily. Patient not taking: Reported on 04/03/2017 11/14/15   Geradine Girt, DO  methocarbamol (ROBAXIN) 500 MG tablet  Take 1 tablet (500 mg total) by mouth every 6 (six) hours as needed for muscle spasms (neck pain). Patient not taking: Reported on 04/18/2016 11/14/15   Geradine Girt, DO  Multiple Vitamins-Minerals (CENTRUM SILVER ADULT 50+) TABS Take 1 tablet by mouth daily.    [provider]  oseltamivir (TAMIFLU) 75 MG capsule Take 1 capsule (75 mg total) by mouth every 12 (twelve) hours. Patient not taking: Reported on 06/12/2016 04/18/16   Shary Decamp, PA-C  traMADol (ULTRAM) 50 MG tablet Take 1 tablet (50 mg total) by mouth every 6 (six) hours as needed for moderate pain. 08/23/15   Mesner, Corene Cornea, MD  valsartan-hydrochlorothiazide (DIOVAN-HCT) 160-25 MG per tablet Take 1 tablet by mouth daily.    [provider]    Family History Family History  Problem Relation Age of Onset  . Crohn's disease Mother   . Stroke Father   . Diabetes Sister   . Hypertension Sister   . Hypertension Brother     Social History Social History   Tobacco Use  . Smoking status: Former Smoker    Packs/day: 2.00    Years: 40.00    Pack years: 80.00    Types: Cigarettes    Last attempt to quit: 05/30/2008    Years since quitting: 8.8  . Smokeless tobacco: Former Systems developer    Quit date: 05/12/2008  . Tobacco comment: Counseled to remain smoke free  Substance Use Topics  . Alcohol use: No    Alcohol/week: 0.0 oz    Comment: Rare  . Drug use: No     Allergies   Penicillins; Sulfa antibiotics; and Oxycodone   Review of Systems Review of Systems  All other systems reviewed and are negative.    Physical Exam Updated Vital Signs BP (!) 120/103 (BP Location: Right Arm)   Pulse 62   Temp (!) 97.4 F (36.3 C) (Oral)   Resp 18   SpO2 100%   Physical Exam  Nursing note and vitals reviewed.  77 year old female, resting comfortably and in no acute distress. Vital signs are significant for hypertension. Oxygen saturation is 100%, which is normal. Head is normocephalic and atraumatic. PERRLA, EOMI.  Oropharynx is clear.  Fundi show grade 2 hypertensive changes. Neck is nontender and supple without adenopathy or JVD.  There are no carotid bruits. Back is nontender and there is no CVA tenderness. Lungs are clear without rales, wheezes, or rhonchi. Chest is nontender. Heart has regular rate and rhythm without murmur. Abdomen is soft, flat, nontender without masses or hepatosplenomegaly and peristalsis is normoactive. Extremities have no cyanosis or edema, full  range of motion is present. Skin is warm and dry without rash. Neurologic: Mental status is normal, cranial nerves are intact, there are no motor or sensory deficits.  There is no pronator drift.  Romberg is negative, but she is mildly off balance.  With tandem gait, she tends to fall to the left when her left foot is in front.  ED Treatments / Results  Labs (all labs ordered are listed, but only abnormal results are displayed) Labs Reviewed  COMPREHENSIVE METABOLIC PANEL - Abnormal; Notable for the following components:      Result Value   BUN 27 (*)    Creatinine, Ser 1.01 (*)    GFR calc non Af Amer 52 (*)    All other components within normal limits  URINALYSIS, ROUTINE W REFLEX MICROSCOPIC - Abnormal; Notable for the following components:   Leukocytes, UA MODERATE (*)    Squamous Epithelial / LPF 6-30 (*)    All other components within normal limits  CBC WITH DIFFERENTIAL/PLATELET    EKG  EKG Interpretation  Date/Time:  Wednesday April 03 2017 17:27:31 EST Ventricular Rate:  59 PR Interval:    QRS Duration: 78 QT Interval:  442 QTC Calculation: 438 R Axis:   27 Text Interpretation:  Sinus rhythm Probable left atrial enlargement Otherwise within normal limits When compared with ECG of 06/12/2016, No significant change was found Confirmed by Delora Fuel ([id number]) on 04/03/2017 7:02:15 PM       Radiology Mr Angiogram Head Wo Contrast  Result Date: 04/03/2017 CLINICAL DATA:  Intermittent double vision over the  last month. EXAM: MRI HEAD WITHOUT CONTRAST MRA HEAD WITHOUT CONTRAST TECHNIQUE: Multiplanar, multiecho pulse sequences of the brain and surrounding structures were obtained without intravenous contrast. Angiographic images of the head were obtained using MRA technique without contrast. COMPARISON:  CT 11/11/2015 FINDINGS: MRI HEAD FINDINGS Brain: Diffusion imaging does not show any acute or subacute infarction. There are mild chronic small-vessel ischemic changes of the pons. No cerebellar abnormality. Cerebral hemispheres show mild to moderate chronic small-vessel ischemic change of the deep and subcortical white matter. No cortical or large vessel territory infarction. No mass lesion, hemorrhage, hydrocephalus or extra-axial collection. Vascular: Major vessels at the base of the brain show flow. Skull and upper cervical spine: Negative Sinuses/Orbits: Clear/normal Other: Empty sella without evidence of pituitary mass. MRA HEAD FINDINGS Both internal carotid arteries are widely patent into the brain. No siphon stenosis. The anterior and middle cerebral vessels are patent without proximal stenosis, aneurysm or vascular malformation. Fetal origin of the left posterior cerebral artery from the anterior circulation. Both vertebral arteries are patent to the basilar with the right being dominant. No basilar stenosis. Posterior circulation branch vessels are patent. As noted above, there is fetal origin of the left posterior cerebral artery. There is a large posterior communicating artery on the right. IMPRESSION: No acute or reversible finding. No abnormality seen to explain double vision. Mild to moderate chronic small-vessel ischemic changes affecting the cerebral hemispheric white matter, often seen at this age. Negative intracranial MR angiography of the large and medium size vessels. No evidence of aneurysm or stenosis. Electronically Signed   By: Nelson Chimes M.D.   On: 04/03/2017 19:24   Mr Brain Wo  Contrast  Result Date: 04/03/2017 CLINICAL DATA:  Intermittent double vision over the last month. EXAM: MRI HEAD WITHOUT CONTRAST MRA HEAD WITHOUT CONTRAST TECHNIQUE: Multiplanar, multiecho pulse sequences of the brain and surrounding structures were obtained without intravenous contrast. Angiographic images of the  head were obtained using MRA technique without contrast. COMPARISON:  CT 11/11/2015 FINDINGS: MRI HEAD FINDINGS Brain: Diffusion imaging does not show any acute or subacute infarction. There are mild chronic small-vessel ischemic changes of the pons. No cerebellar abnormality. Cerebral hemispheres show mild to moderate chronic small-vessel ischemic change of the deep and subcortical white matter. No cortical or large vessel territory infarction. No mass lesion, hemorrhage, hydrocephalus or extra-axial collection. Vascular: Major vessels at the base of the brain show flow. Skull and upper cervical spine: Negative Sinuses/Orbits: Clear/normal Other: Empty sella without evidence of pituitary mass. MRA HEAD FINDINGS Both internal carotid arteries are widely patent into the brain. No siphon stenosis. The anterior and middle cerebral vessels are patent without proximal stenosis, aneurysm or vascular malformation. Fetal origin of the left posterior cerebral artery from the anterior circulation. Both vertebral arteries are patent to the basilar with the right being dominant. No basilar stenosis. Posterior circulation branch vessels are patent. As noted above, there is fetal origin of the left posterior cerebral artery. There is a large posterior communicating artery on the right. IMPRESSION: No acute or reversible finding. No abnormality seen to explain double vision. Mild to moderate chronic small-vessel ischemic changes affecting the cerebral hemispheric white matter, often seen at this age. Negative intracranial MR angiography of the large and medium size vessels. No evidence of aneurysm or stenosis.  Electronically Signed   By: Nelson Chimes M.D.   On: 04/03/2017 19:24    Procedures Procedures (including critical care time)  Medications Ordered in ED Medications - No data to display   Initial Impression / Assessment and Plan / ED Course  I have reviewed the triage vital signs and the nursing notes.  Pertinent labs & imaging results that were available during my care of the patient were reviewed by me and considered in my medical decision making (see chart for details).  Episodes of visual loss.  I am concerned that this may be amaurosis fugax.  Also, transient episodes of loss of balance without actual falls.  Old records are reviewed, and she has no relevant past visits.  She will be sent for MRI of the brain and MRA of the head.  She should get the full workup for transient ischemic attack.  However, since symptoms have been going on for approximately 10 weeks, I do not see an indication for admission to complete the workup.  She will need carotid duplex ultrasound and echocardiogram to complete her workup.  Laboratory workup and MRI are unremarkable.  No evidence of stroke, no evidence of any arterial narrowing or aneurysm.  At this point, I still feel that this probably represents amaurosis fugax and she will need carotid duplex ultrasound and echocardiogram to complete her workup.  I have explained this to the patient.  I have also explained to her that she probably should be on aspirin since her systemic anticoagulant does not help with TIAs.  She is very concerned since her doctor had told her never to take aspirin.  I have asked her to discuss with her primary care provider possibly starting on low-dose aspirin, will consider other antiplatelet treatment.  Final Clinical Impressions(s) / ED Diagnoses   Final diagnoses:  Transient visual disturbance, bilateral  Chronic anticoagulation    ED Discharge Orders    None       Delora Fuel, MD 45/62/56 [phone]

## 2017-04-03 NOTE — Discharge Instructions (Signed)
Talk with your doctor about getting some additional tests done. I believe you should have an ultrasound of your carotid arteries (blood vessels in your neck), and an ultrasound of your heart - called an echocardiogram. You also need to talk with him about possibly going on low-dose aspirin.

## 2017-04-03 NOTE — ED Notes (Signed)
20/50 Right EYE 20/40 Left EYE

## 2017-04-04 ENCOUNTER — Other Ambulatory Visit: Payer: Self-pay | Admitting: Internal Medicine

## 2017-04-04 DIAGNOSIS — G43109 Migraine with aura, not intractable, without status migrainosus: Secondary | ICD-10-CM | POA: Diagnosis not present

## 2017-04-04 DIAGNOSIS — H539 Unspecified visual disturbance: Secondary | ICD-10-CM | POA: Diagnosis not present

## 2017-04-04 DIAGNOSIS — I712 Thoracic aortic aneurysm, without rupture, unspecified: Secondary | ICD-10-CM

## 2017-04-08 ENCOUNTER — Ambulatory Visit
Admission: RE | Admit: 2017-04-08 | Discharge: 2017-04-08 | Disposition: A | Discharge status: Home / Self Care | Payer: Medicare HMO | Source: Ambulatory Visit | Attending: Internal Medicine | Admitting: Internal Medicine

## 2017-04-08 DIAGNOSIS — I712 Thoracic aortic aneurysm, without rupture, unspecified: Secondary | ICD-10-CM

## 2017-04-10 ENCOUNTER — Telehealth: Payer: Self-pay | Admitting: Interventional Cardiology

## 2017-04-10 NOTE — Telephone Encounter (Signed)
Will route to Medical Records.  

## 2017-04-10 NOTE — Telephone Encounter (Signed)
Mrs. [place] is calling to get her records from her last visit . Please call

## 2017-04-16 NOTE — Telephone Encounter (Signed)
VM lfet for pt to call back release will need to be signed.

## 2017-04-19 DIAGNOSIS — G4733 Obstructive sleep apnea (adult) (pediatric): Secondary | ICD-10-CM | POA: Diagnosis not present

## 2017-05-01 DIAGNOSIS — I2510 Atherosclerotic heart disease of native coronary artery without angina pectoris: Secondary | ICD-10-CM | POA: Diagnosis not present

## 2017-05-01 DIAGNOSIS — I712 Thoracic aortic aneurysm, without rupture: Secondary | ICD-10-CM | POA: Diagnosis not present

## 2017-05-01 DIAGNOSIS — J449 Chronic obstructive pulmonary disease, unspecified: Secondary | ICD-10-CM | POA: Diagnosis not present

## 2017-05-01 DIAGNOSIS — E039 Hypothyroidism, unspecified: Secondary | ICD-10-CM | POA: Diagnosis not present

## 2017-05-01 DIAGNOSIS — G43109 Migraine with aura, not intractable, without status migrainosus: Secondary | ICD-10-CM | POA: Diagnosis not present

## 2017-05-01 DIAGNOSIS — I10 Essential (primary) hypertension: Secondary | ICD-10-CM | POA: Diagnosis not present

## 2017-05-01 DIAGNOSIS — R69 Illness, unspecified: Secondary | ICD-10-CM | POA: Diagnosis not present

## 2017-05-01 DIAGNOSIS — E782 Mixed hyperlipidemia: Secondary | ICD-10-CM | POA: Diagnosis not present

## 2017-05-01 DIAGNOSIS — H539 Unspecified visual disturbance: Secondary | ICD-10-CM | POA: Diagnosis not present

## 2017-05-01 NOTE — Progress Notes (Addendum)
Cardiology Office Note   Date:  05/03/2017   ID:  Denise Cline, DOB [birth year], MRN [id number]  PCP:  Denise Huddle, MD  Cardiologist:  Dr. Inda Merlin    Chief Complaint  Patient presents with  . Coronary Artery Disease    she is worried that she is not healthy.        History of Present Illness: Denise Cline is a 77 y.o. female who presents for CAD on screening from CT   The patient has a history of hypertension, tobacco use-COPD, thoracic aneurysm, hyperlipidemia, obesity, and generalized stress disorder. A low energy CT scan was performed to screen for lung cancer. This study demonstrated multiple nodules that need to have longitudinal follow-up, enlargement of the ascending aorta (4.5 cm), and two-vessel coronary artery disease.  OSA on CPAP, anticoagulation for Lt.  DVT and PE  Nuc 05/2015 EF 54% low risk study  Recent CTA chest with no change in 4.3 cm thoracic aneurysm calcific coronary arteries +cardiomegaly recent ER visit with visual changes MRI MRA of head with no acute findings, Mild to moderate chronic small-vessel ischemic changes affecting the cerebral hemispheric white matter, often seen at this age.  She is stressed today because she has been caring for a brother who has died.  She has no chest pain but increasing SOB.  May be due to deconditioning but she is worried it is from her heart.  She stopped tobacco in 2010.  She had questions about aneurysm about carotid arteries.  She is using her CPAP. She complains of her feet being cold and is concerned about arterial blockage in legs.  Most of pain in Lt leg which is leg of DVT. Her BP is controlled.    Past Medical History:  Diagnosis Date  . Anxiety   . COPD (chronic obstructive pulmonary disease) ([place])   . Fibroid   . History of shingles   . Hypertension   . Thyroid disease    Nodules  . Varicose veins     Past Surgical History:  Procedure Laterality Date  . ABDOMINAL HYSTERECTOMY    . COLONOSCOPY  N/A 10/05/2014   Procedure: COLONOSCOPY;  Surgeon: Ronald Lobo, MD;  Location: [facility];  Service: Endoscopy;  Laterality: N/A;  . OOPHORECTOMY     One ovary removed  . Thyroid nodules    . THYROID SURGERY       Current Outpatient Medications  Medication Sig Dispense Refill  . acetaminophen (TYLENOL) 325 MG tablet Take 2 tablets (650 mg total) by mouth every 6 (six) hours.    Marland Kitchen acetaminophen (TYLENOL) 650 MG CR tablet Take 650-1,300 mg by mouth 2 (two) times daily as needed for pain.    Marland Kitchen aspirin EC 81 MG tablet Take 81 mg by mouth daily.    Marland Kitchen buPROPion (WELLBUTRIN SR) 150 MG 12 hr tablet Take 150 mg by mouth 2 (two) times daily.     Marland Kitchen losartan-hydrochlorothiazide (HYZAAR) 100-25 MG tablet Take 1 tablet by mouth daily.    . Multiple Vitamins-Minerals (CENTRUM SILVER ADULT 50+) TABS Take 1 tablet by mouth daily.    Marland Kitchen OVER THE COUNTER MEDICATION Take 1 capsule by mouth at bedtime. (ColonClenz)    . Polyvinyl Alcohol-Povidone (REFRESH OP) Place 1 drop into both eyes 3 (three) times daily.     Marland Kitchen topiramate (TOPAMAX) 25 MG tablet Take 1-2 tablets by mouth at bedtime.  5  . traMADol (ULTRAM) 50 MG tablet Take 1 tablet (50 mg total) by mouth every  6 (six) hours as needed for moderate pain. 30 tablet 0  . triamcinolone (KENALOG) 0.025 % cream Apply 1 application topically daily as needed for itching.     No current facility-administered medications for this visit.     Allergies:   Penicillins; Sulfa antibiotics; and Oxycodone    Social History:  The patient  reports that she quit smoking about 8 years ago. Her smoking use included cigarettes. She has a 80.00 pack-year smoking history. She quit smokeless tobacco use about 8 years ago. She reports that she does not drink alcohol or use drugs.   Family History:  The patient's family history includes Crohn's disease in her mother; Diabetes in her sister; Hypertension in her brother and sister; Stroke in her father.    ROS:  General:no  colds or fevers, no weight changes Skin:no rashes or ulcers HEENT:no blurred vision, no congestion CV:see HPI PUL:see HPI GI:no diarrhea constipation or melena, no indigestion GU:no hematuria, no dysuria MS:no joint pain, ? possible claudication Neuro:no syncope, no lightheadedness Endo:no diabetes, no thyroid disease  Wt Readings from Last 3 Encounters:  05/02/17 202 lb 12.8 oz (92 kg)  06/13/16 200 lb (90.7 kg)  04/18/16 202 lb (91.6 kg)     PHYSICAL EXAM: VS:  BP 110/74   Pulse 78   Ht 5\' 6"  (1.676 m)   Wt 202 lb 12.8 oz (92 kg)   BMI 32.73 kg/m  , BMI Body mass index is 32.73 kg/m. General:Pleasant affect, NAD Skin:Warm and dry, brisk capillary refill HEENT:normocephalic, sclera clear, mucus membranes moist Neck:supple, no JVD, no bruits  Heart:S1S2 RRR without murmur, gallup, rub or click Lungs:clear without rales, rhonchi, or wheezes AYT:KZSW, non tender, + BS, do not palpate liver spleen or masses Ext:no to tr. lower ext edema, ? pedal pulses, 2+ radial pulses Neuro:alert and oriented X 3, MAE, follows commands, + facial symmetry    EKG:  EKG is NOT ordered today.    Recent Labs: 04/03/2017: ALT 20; BUN 27; Creatinine, Ser 1.01; Hemoglobin 12.7; Platelets 298; Potassium 3.7; Sodium 139    Lipid Panel    Component Value Date/Time   CHOL  05/15/2009 0555    187        ATP III CLASSIFICATION:  <200     mg/dL   Desirable  200-239  mg/dL   Borderline High  >=240    mg/dL   High          TRIG 112 05/15/2009 0555   HDL 51 05/15/2009 0555   CHOLHDL 3.7 05/15/2009 0555   VLDL 22 05/15/2009 0555   LDLCALC (H) 05/15/2009 0555    114        Total Cholesterol/HDL:CHD Risk Coronary Heart Disease Risk Table                     Men   Women  1/2 Average Risk   3.4   3.3  Average Risk       5.0   4.4  2 X Average Risk   9.6   7.1  3 X Average Risk  23.4   11.0        Use the calculated Patient Ratio above and the CHD Risk Table to determine the patient's CHD  Risk.        ATP III CLASSIFICATION (LDL):  <100     mg/dL   Optimal  100-129  mg/dL   Near or Above  Optimal  130-159  mg/dL   Borderline  160-189  mg/dL   High  >190     mg/dL   Very High       Other studies Reviewed: Additional studies/ records that were reviewed today include: . nuc study 05/19/15 Study Highlights    Nuclear stress EF: 54%.  The left ventricular ejection fraction is mildly decreased (45-54%).  There was no ST segment deviation noted during stress.  No T wave inversion was noted during stress.  This is a low risk study.   No significant reversible ischemia. LVEF 54% with normal wall motion. This is a low risk study.      ASSESSMENT AND PLAN:  1.  DOE pt very concerned this is angina.  Will proceed with lexiscan myoivew.  Follow up in several weeks with me  2.   Possible claudication in legs will check lower ext arterial dopplers.  3.   HTN  controlled   4.   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 05/30/17  Please note this was written incorrectly no DVT/PE and no Xarelto.    5.    Coronary artery calcification seen on CT will recheck stress test  6.    thoracic aortic aneurysm stable on recent CTA of chest may benefit from BB       Current medicines are reviewed with the patient today.  The patient Has no concerns regarding medicines.  The following changes have been made:  See above Labs/ tests ordered today include:see above  Disposition:   FU:  see above  Signed, Cecilie Kicks, NP  05/03/2017 4:06 PM    [facility] [place], [address] [place], [place] Phone: [phone]; Fax: [phone]

## 2017-05-02 ENCOUNTER — Ambulatory Visit: Payer: Medicare HMO | Admitting: Cardiology

## 2017-05-02 ENCOUNTER — Encounter: Payer: Self-pay | Admitting: Cardiology

## 2017-05-02 VITALS — BP 110/74 | HR 78 | Ht 66.0 in | Wt 202.8 lb

## 2017-05-02 DIAGNOSIS — I712 Thoracic aortic aneurysm, without rupture, unspecified: Secondary | ICD-10-CM

## 2017-05-02 DIAGNOSIS — I2510 Atherosclerotic heart disease of native coronary artery without angina pectoris: Secondary | ICD-10-CM

## 2017-05-02 DIAGNOSIS — G4733 Obstructive sleep apnea (adult) (pediatric): Secondary | ICD-10-CM | POA: Diagnosis not present

## 2017-05-02 DIAGNOSIS — R0609 Other forms of dyspnea: Secondary | ICD-10-CM

## 2017-05-02 DIAGNOSIS — R0600 Dyspnea, unspecified: Secondary | ICD-10-CM

## 2017-05-02 DIAGNOSIS — I739 Peripheral vascular disease, unspecified: Secondary | ICD-10-CM | POA: Diagnosis not present

## 2017-05-02 NOTE — Patient Instructions (Signed)
Medication Instructions:  Your physician recommends that you continue on your current medications as directed. Please refer to the Current Medication list given to you today.  Labwork: None  Testing/Procedures: Your physician has requested that you have a lexiscan myoview. For further information please visit [email or web address]. Please follow instruction sheet, as given.  Your physician has requested that you have a lower or upper extremity arterial duplex. This test is an ultrasound of the arteries in the legs or arms. It looks at arterial blood flow in the legs and arms. Allow one hour for Lower and Upper Arterial scans. There are no restrictions or special instructions  Follow-Up: Your physician recommends that you schedule a follow-up appointment in: 4 weeks with Cecilie Kicks, NP.  Your physician wants you to follow-up in: 5 months with Dr. Tamala Julian.  You will receive a reminder letter in the mail two months in advance. If you don't receive a letter, please call our office to schedule the follow-up appointment.    Any Other Special Instructions Will Be Listed Below (If Applicable).     If you need a refill on your cardiac medications before your next appointment, please call your pharmacy.

## 2017-05-03 ENCOUNTER — Encounter: Payer: Self-pay | Admitting: Cardiology

## 2017-05-06 ENCOUNTER — Other Ambulatory Visit: Payer: Self-pay | Admitting: Cardiology

## 2017-05-06 DIAGNOSIS — I739 Peripheral vascular disease, unspecified: Secondary | ICD-10-CM

## 2017-05-14 ENCOUNTER — Telehealth (HOSPITAL_COMMUNITY): Payer: Self-pay

## 2017-05-14 NOTE — Telephone Encounter (Signed)
Encounter complete. 

## 2017-05-15 ENCOUNTER — Encounter (HOSPITAL_COMMUNITY): Payer: Self-pay | Admitting: Cardiology

## 2017-05-16 ENCOUNTER — Ambulatory Visit (HOSPITAL_COMMUNITY)
Admission: RE | Admit: 2017-05-16 | Discharge: 2017-05-16 | Disposition: A | Discharge status: Home / Self Care | Payer: Medicare HMO | Source: Ambulatory Visit | Attending: Cardiology | Admitting: Cardiology

## 2017-05-16 ENCOUNTER — Ambulatory Visit (HOSPITAL_COMMUNITY)
Admission: RE | Admit: 2017-05-16 | Discharge: 2017-05-16 | Disposition: A | Discharge status: Home / Self Care | Payer: Medicare HMO | Source: Ambulatory Visit | Attending: Cardiovascular Disease | Admitting: Cardiovascular Disease

## 2017-05-16 DIAGNOSIS — R0609 Other forms of dyspnea: Secondary | ICD-10-CM | POA: Insufficient documentation

## 2017-05-16 DIAGNOSIS — R0600 Dyspnea, unspecified: Secondary | ICD-10-CM

## 2017-05-16 DIAGNOSIS — I739 Peripheral vascular disease, unspecified: Secondary | ICD-10-CM

## 2017-05-16 LAB — MYOCARDIAL PERFUSION IMAGING
CHL CUP NUCLEAR SSS: 0
CHL CUP RESTING HR STRESS: 59 {beats}/min
LVDIAVOL: 85 mL (ref 46–106)
LVSYSVOL: 41 mL
Peak HR: 83 {beats}/min
SDS: 0
SRS: 0
TID: 1.32

## 2017-05-16 MED ORDER — TECHNETIUM TC 99M TETROFOSMIN IV KIT
31.7000 | PACK | Freq: Once | INTRAVENOUS | Status: AC | PRN
  Administered 2017-05-16: 31.7 via INTRAVENOUS
  Filled 2017-05-16: qty 32

## 2017-05-16 MED ORDER — TECHNETIUM TC 99M TETROFOSMIN IV KIT
9.8000 | PACK | Freq: Once | INTRAVENOUS | Status: AC | PRN
  Administered 2017-05-16: 9.8 via INTRAVENOUS
  Filled 2017-05-16: qty 10

## 2017-05-16 MED ORDER — REGADENOSON 0.4 MG/5ML IV SOLN
0.4000 mg | Freq: Once | INTRAVENOUS | Status: AC
  Administered 2017-05-16: 0.4 mg via INTRAVENOUS

## 2017-05-17 ENCOUNTER — Telehealth: Payer: Self-pay | Admitting: *Deleted

## 2017-05-17 DIAGNOSIS — I712 Thoracic aortic aneurysm, without rupture, unspecified: Secondary | ICD-10-CM

## 2017-05-17 NOTE — Telephone Encounter (Signed)
-----   Message from Isaiah Serge, NP sent at 05/16/2017  5:43 PM EDT ----- No lack of blood supply to the heart , let's check ultrasound -echo to eval EF.  But otherwise this is good.

## 2017-05-17 NOTE — Telephone Encounter (Signed)
Spoke with pt. See result note.  °

## 2017-05-17 NOTE — Progress Notes (Signed)
Pt has been made aware of normal result and verbalized understanding.  jw 05/17/17

## 2017-05-22 ENCOUNTER — Ambulatory Visit (HOSPITAL_COMMUNITY): Payer: Medicare HMO | Attending: Cardiology

## 2017-05-22 ENCOUNTER — Other Ambulatory Visit: Payer: Self-pay

## 2017-05-22 DIAGNOSIS — I712 Thoracic aortic aneurysm, without rupture, unspecified: Secondary | ICD-10-CM

## 2017-05-22 DIAGNOSIS — Z87891 Personal history of nicotine dependence: Secondary | ICD-10-CM | POA: Diagnosis not present

## 2017-05-22 DIAGNOSIS — I2510 Atherosclerotic heart disease of native coronary artery without angina pectoris: Secondary | ICD-10-CM | POA: Diagnosis not present

## 2017-05-22 DIAGNOSIS — I10 Essential (primary) hypertension: Secondary | ICD-10-CM | POA: Insufficient documentation

## 2017-05-22 DIAGNOSIS — J449 Chronic obstructive pulmonary disease, unspecified: Secondary | ICD-10-CM | POA: Diagnosis not present

## 2017-05-30 ENCOUNTER — Ambulatory Visit: Payer: Medicare HMO | Admitting: Cardiology

## 2017-05-30 ENCOUNTER — Encounter: Payer: Self-pay | Admitting: Cardiology

## 2017-05-30 VITALS — BP 126/88 | HR 75 | Ht 66.0 in | Wt 203.4 lb

## 2017-05-30 DIAGNOSIS — M79604 Pain in right leg: Secondary | ICD-10-CM | POA: Diagnosis not present

## 2017-05-30 DIAGNOSIS — I712 Thoracic aortic aneurysm, without rupture, unspecified: Secondary | ICD-10-CM

## 2017-05-30 DIAGNOSIS — R0609 Other forms of dyspnea: Secondary | ICD-10-CM

## 2017-05-30 DIAGNOSIS — I10 Essential (primary) hypertension: Secondary | ICD-10-CM | POA: Diagnosis not present

## 2017-05-30 DIAGNOSIS — I2510 Atherosclerotic heart disease of native coronary artery without angina pectoris: Secondary | ICD-10-CM

## 2017-05-30 DIAGNOSIS — M79605 Pain in left leg: Secondary | ICD-10-CM | POA: Diagnosis not present

## 2017-05-30 DIAGNOSIS — R0600 Dyspnea, unspecified: Secondary | ICD-10-CM

## 2017-05-30 MED ORDER — METOPROLOL SUCCINATE ER 25 MG PO TB24: 25.0000 mg | ORAL_TABLET | Freq: Every day | ORAL | 3 refills | Status: DC

## 2017-05-30 NOTE — Progress Notes (Signed)
Cardiology Office Note   Date:  05/30/2017   ID:  AQUINNAH DEVIN, DOB 1940/05/03, MRN [id number]  PCP:  Josetta Huddle, MD  Cardiologist:  Dr. Tamala Julian    Chief Complaint  Patient presents with  . Shortness of Breath      History of Present Illness: AYEISHA LINDENBERGER is a 77 y.o. female who presents for DOE and leg pain and follow up of tests.  The patient has a history of hypertension, tobacco use-COPD, thoracic aneurysm, hyperlipidemia, obesity, and generalized stress disorder. A low energy CT scan was performed to screen for lung cancer. This study demonstrated multiple nodules that need to have longitudinal follow-up, enlargement of the ascending aorta (4.5 cm), and two-vessel coronary artery disease.  OSA on CPAP, anticoagulation for Lt.  DVT and PE  Nuc 05/2015 EF 54% low risk study  Recent CTA 04/2017 chest with no change in 4.3 cm thoracic aneurysm calcific coronary arteries +cardiomegaly recent ER visit with visual changes MRI MRA of head with no acute findings, Mild to moderate chronic small-vessel ischemic changes affecting the cerebral hemispheric white matter, often seen at this age.  At last visit 05/02/17 with DOE and concern for arterial blockage in legs with bilateral leg pain.    We did nuc study with no ischemia and mildly decreased EF --Echo with EF 65-70% g1DD, mildly dilated aortic rood and ascending aorta.  Mild LVH,  ABI of lower ext normal.   Today she is doing well, just very concerned about her health.  We discussed adding BB to meds due to aneurysm and just watching this.  Reassured about the CT coronary artery calicfication.  Her stress myoview was normal and her Echo -  Though with dilation of aortic root and ascending aorta, this is followed with CT scans.  Her ABIs were normal.  Discussed.  She has anxiety about her condition she does tell me she is seeing therapist.    Family is with her today.    Past Medical History:  Diagnosis Date  . Anxiety     . COPD (chronic obstructive pulmonary disease) ([place])   . Fibroid   . History of shingles   . Hypertension   . Thyroid disease    Nodules  . Varicose veins     Past Surgical History:  Procedure Laterality Date  . ABDOMINAL HYSTERECTOMY    . COLONOSCOPY N/A 10/05/2014   Procedure: COLONOSCOPY;  Surgeon: Ronald Lobo, MD;  Location: [facility];  Service: Endoscopy;  Laterality: N/A;  . OOPHORECTOMY     One ovary removed  . Thyroid nodules    . THYROID SURGERY       Current Outpatient Medications  Medication Sig Dispense Refill  . acetaminophen (TYLENOL) 650 MG CR tablet Take 650-1,300 mg by mouth 2 (two) times daily as needed for pain.    Marland Kitchen aspirin EC 81 MG tablet Take 81 mg by mouth daily.    Marland Kitchen buPROPion (WELLBUTRIN SR) 150 MG 12 hr tablet Take 150 mg by mouth 2 (two) times daily.     Marland Kitchen losartan-hydrochlorothiazide (HYZAAR) 100-25 MG tablet Take 1 tablet by mouth daily.    . Multiple Vitamins-Minerals (CENTRUM SILVER ADULT 50+) TABS Take 1 tablet by mouth daily.    Marland Kitchen OVER THE COUNTER MEDICATION Take 1 capsule by mouth at bedtime. (ColonClenz)    . Polyvinyl Alcohol-Povidone (REFRESH OP) Place 1 drop into both eyes 3 (three) times daily.     . rivaroxaban (XARELTO) 20 MG TABS tablet Take  20 mg by mouth daily with supper.    . topiramate (TOPAMAX) 25 MG tablet Take 1-2 tablets by mouth at bedtime.  5  . traMADol (ULTRAM) 50 MG tablet Take 1 tablet (50 mg total) by mouth every 6 (six) hours as needed for moderate pain. 30 tablet 0  . triamcinolone (KENALOG) 0.025 % cream Apply 1 application topically daily as needed for itching.     No current facility-administered medications for this visit.     Allergies:   Penicillins; Sulfa antibiotics; and Oxycodone    Social History:  The patient  reports that she quit smoking about 9 years ago. Her smoking use included cigarettes. She has a 80.00 pack-year smoking history. She quit smokeless tobacco use about 9 years ago. She reports  that she does not drink alcohol or use drugs.   Family History:  The patient's family history includes Crohn's disease in her mother; Diabetes in her sister; Hypertension in her brother and sister; Stroke in her father.    ROS:  General:no colds or fevers, no weight changes Skin:no rashes or ulcers HEENT:no blurred vision, no congestion, a bump inside of nose,  Teeth feel strange at gums. CV:see HPI PUL:see HPI GI:no diarrhea constipation or melena, no indigestion GU:no hematuria, no dysuria MS:no joint pain, no claudication Neuro:no syncope, no lightheadedness Endo:no diabetes, no thyroid disease  Wt Readings from Last 3 Encounters:  05/30/17 203 lb 6.4 oz (92.3 kg)  05/16/17 202 lb (91.6 kg)  05/02/17 202 lb 12.8 oz (92 kg)     PHYSICAL EXAM: VS:  BP 126/88   Pulse 75   Ht 5\' 6"  (1.676 m)   Wt 203 lb 6.4 oz (92.3 kg)   SpO2 94%   BMI 32.83 kg/m  , BMI Body mass index is 32.83 kg/m. General:Pleasant affect, NAD Skin:Warm and dry, brisk capillary refill HEENT:normocephalic, sclera clear, mucus membranes moist, no obvious dental/gum issues, no obvious knot or bump in Lt nare. Neck:supple, no JVD, no bruits  Heart:S1S2 RRR without murmur, gallup, rub or click Lungs:clear without rales, Occ rhonchi, no wheezes DUK:GURK, non tender, + BS, do not palpate liver spleen or masses Ext:no lower ext edema,  2+ radial pulses Neuro:alert and oriented X 3, MAE, follows commands, + facial symmetry    EKG:  EKG is NOT ordered today.   Recent Labs: 04/03/2017: ALT 20; BUN 27; Creatinine, Ser 1.01; Hemoglobin 12.7; Platelets 298; Potassium 3.7; Sodium 139    Lipid Panel    Component Value Date/Time   CHOL  05/15/2009 0555    187        ATP III CLASSIFICATION:  <200     mg/dL   Desirable  200-239  mg/dL   Borderline High  >=240    mg/dL   High          TRIG 112 05/15/2009 0555   HDL 51 05/15/2009 0555   CHOLHDL 3.7 05/15/2009 0555   VLDL 22 05/15/2009 0555   LDLCALC (H)  05/15/2009 0555    114        Total Cholesterol/HDL:CHD Risk Coronary Heart Disease Risk Table                     Men   Women  1/2 Average Risk   3.4   3.3  Average Risk       5.0   4.4  2 X Average Risk   9.6   7.1  3 X Average Risk  23.4  11.0        Use the calculated Patient Ratio above and the CHD Risk Table to determine the patient's CHD Risk.        ATP III CLASSIFICATION (LDL):  <100     mg/dL   Optimal  100-129  mg/dL   Near or Above                    Optimal  130-159  mg/dL   Borderline  160-189  mg/dL   High  >190     mg/dL   Very High       Other studies Reviewed: Additional studies/ records that were reviewed today include: .   05/16/17 Study Highlights     The left ventricular ejection fraction is mildly decreased (45-54%).  Nuclear stress EF: 52%.  No T wave inversion was noted during stress.  There was no ST segment deviation noted during stress.  The study is normal.  This is a low risk study.   Normal perfusion. LVEF 52% with normal wall motion. This is a low risk study.   Echo 05/22/17 Study Conclusions  - Left ventricle: The cavity size was normal. Wall thickness was   increased in a pattern of mild LVH. Systolic function was   vigorous. The estimated ejection fraction was in the range of 65%   to 70%. Wall motion was normal; there were no regional wall   motion abnormalities. Doppler parameters are consistent with   abnormal left ventricular relaxation (grade 1 diastolic   dysfunction). - Aortic valve: There was no stenosis. There was trivial   regurgitation. - Aorta: Mildly dilated aortic root and ascending aorta. Aortic   root diameter: 40 mm. Ascending aortic diameter: 42 mm (S). - Mitral valve: There was trivial regurgitation. - Right ventricle: The cavity size was normal. Systolic function   was normal. - Tricuspid valve: Peak RV-RA gradient (S): 29 mm Hg. - Pulmonary arteries: PA peak pressure: 32 mm Hg (S). - Inferior  vena cava: The vessel was normal in size. The   respirophasic diameter changes were in the normal range (>= 50%),   consistent with normal central venous pressure.  Impressions:  - Normal LV size with mild LV hypertrophy. EF 65-70%. Normal RV   size and systolic function. No significant valvular   abnormalities. Mildly dilated aortic root and ascending aorta.  Bilateral ABIs  05/16/17  Bilateral ABIs appear essentially unchanged compared to prior study on 10/22/14.  Final Interpretation:  Right: Resting right ankle-brachial index is within normal range. No evidence of significant right lower extremity arterial disease. The right toe-brachial index is abnormal.  Left: Resting left ankle-brachial index is within normal range. No evidence of significant left lower extremity arterial disease. The left toe-brachial index is abnormal.  ASSESSMENT AND PLAN:  1.  DOE with normal nuc study and fairly normal Echo - most likely due to deconditioning and COPD. Encouraged exercise just walking is fine.  Follow up with Dr. Tamala Julian in 6 months   2.  Coronary artery calcification on CTA of chest -  No ischemia on nuc study pt reassured.  3.  HTN  Controlled  4.  Foot and leg pain, with normal ABIs. Reassured  5.  Thoracic aortic aneurysm stable, added toprol XL 25 mg daily.      Current medicines are reviewed with the patient today.  The patient Has no concerns regarding medicines.  The following changes have been made:  See above Labs/ tests ordered  today include:see above  Disposition:   FU:  see above  Signed, Cecilie Kicks, NP  05/30/2017 10:04 AM    [facility], [place], [place], [place] Phone: [phone]; Fax: [phone]

## 2017-05-30 NOTE — Patient Instructions (Addendum)
Medication Instructions:  1. START TOPROL XL 25 MG TAKE 1 TABLET DAILY; RX HAS BEEN SENT IN  Labwork: NONE ORDERED TODAY  Testing/Procedures: NONE ORDERED TODAY  Follow-Up: Your physician wants you to follow-up in: 6 MONTHS WITH DR. Gaspar Bidding will receive a reminder letter in the mail two months in advance. If you don't receive a letter, please call our office to schedule the follow-up appointment.   Any Other Special Instructions Will Be Listed Below (If Applicable).     If you need a refill on your cardiac medications before your next appointment, please call your pharmacy.

## 2017-06-17 DIAGNOSIS — E782 Mixed hyperlipidemia: Secondary | ICD-10-CM | POA: Diagnosis not present

## 2017-06-17 DIAGNOSIS — J4521 Mild intermittent asthma with (acute) exacerbation: Secondary | ICD-10-CM | POA: Diagnosis not present

## 2017-06-17 DIAGNOSIS — R69 Illness, unspecified: Secondary | ICD-10-CM | POA: Diagnosis not present

## 2017-06-17 DIAGNOSIS — E039 Hypothyroidism, unspecified: Secondary | ICD-10-CM | POA: Diagnosis not present

## 2017-06-17 DIAGNOSIS — M179 Osteoarthritis of knee, unspecified: Secondary | ICD-10-CM | POA: Diagnosis not present

## 2017-06-17 DIAGNOSIS — I10 Essential (primary) hypertension: Secondary | ICD-10-CM | POA: Diagnosis not present

## 2017-06-19 DIAGNOSIS — K5900 Constipation, unspecified: Secondary | ICD-10-CM | POA: Diagnosis not present

## 2017-06-19 DIAGNOSIS — G8929 Other chronic pain: Secondary | ICD-10-CM | POA: Diagnosis not present

## 2017-06-19 DIAGNOSIS — I10 Essential (primary) hypertension: Secondary | ICD-10-CM | POA: Diagnosis not present

## 2017-06-19 DIAGNOSIS — G43909 Migraine, unspecified, not intractable, without status migrainosus: Secondary | ICD-10-CM | POA: Diagnosis not present

## 2017-06-19 DIAGNOSIS — R69 Illness, unspecified: Secondary | ICD-10-CM | POA: Diagnosis not present

## 2017-06-19 DIAGNOSIS — G4730 Sleep apnea, unspecified: Secondary | ICD-10-CM | POA: Diagnosis not present

## 2017-06-19 DIAGNOSIS — L309 Dermatitis, unspecified: Secondary | ICD-10-CM | POA: Diagnosis not present

## 2017-06-19 DIAGNOSIS — I82509 Chronic embolism and thrombosis of unspecified deep veins of unspecified lower extremity: Secondary | ICD-10-CM | POA: Diagnosis not present

## 2017-06-19 DIAGNOSIS — H04129 Dry eye syndrome of unspecified lacrimal gland: Secondary | ICD-10-CM | POA: Diagnosis not present

## 2017-06-19 DIAGNOSIS — J449 Chronic obstructive pulmonary disease, unspecified: Secondary | ICD-10-CM | POA: Diagnosis not present

## 2017-06-24 DIAGNOSIS — H16223 Keratoconjunctivitis sicca, not specified as Sjogren's, bilateral: Secondary | ICD-10-CM | POA: Diagnosis not present

## 2017-07-30 DIAGNOSIS — E782 Mixed hyperlipidemia: Secondary | ICD-10-CM | POA: Diagnosis not present

## 2017-07-30 DIAGNOSIS — I2510 Atherosclerotic heart disease of native coronary artery without angina pectoris: Secondary | ICD-10-CM | POA: Diagnosis not present

## 2017-07-30 DIAGNOSIS — M169 Osteoarthritis of hip, unspecified: Secondary | ICD-10-CM | POA: Diagnosis not present

## 2017-07-30 DIAGNOSIS — I10 Essential (primary) hypertension: Secondary | ICD-10-CM | POA: Diagnosis not present

## 2017-07-30 DIAGNOSIS — E039 Hypothyroidism, unspecified: Secondary | ICD-10-CM | POA: Diagnosis not present

## 2017-07-30 DIAGNOSIS — J449 Chronic obstructive pulmonary disease, unspecified: Secondary | ICD-10-CM | POA: Diagnosis not present

## 2017-07-30 DIAGNOSIS — R69 Illness, unspecified: Secondary | ICD-10-CM | POA: Diagnosis not present

## 2017-07-30 DIAGNOSIS — M179 Osteoarthritis of knee, unspecified: Secondary | ICD-10-CM | POA: Diagnosis not present

## 2017-07-30 DIAGNOSIS — J4521 Mild intermittent asthma with (acute) exacerbation: Secondary | ICD-10-CM | POA: Diagnosis not present

## 2017-08-14 DIAGNOSIS — Z658 Other specified problems related to psychosocial circumstances: Secondary | ICD-10-CM | POA: Diagnosis not present

## 2017-09-17 DIAGNOSIS — H2513 Age-related nuclear cataract, bilateral: Secondary | ICD-10-CM | POA: Diagnosis not present

## 2017-09-17 DIAGNOSIS — H04123 Dry eye syndrome of bilateral lacrimal glands: Secondary | ICD-10-CM | POA: Diagnosis not present

## 2017-09-17 DIAGNOSIS — H25043 Posterior subcapsular polar age-related cataract, bilateral: Secondary | ICD-10-CM | POA: Diagnosis not present

## 2017-09-17 DIAGNOSIS — H18413 Arcus senilis, bilateral: Secondary | ICD-10-CM | POA: Diagnosis not present

## 2017-09-17 DIAGNOSIS — H25013 Cortical age-related cataract, bilateral: Secondary | ICD-10-CM | POA: Diagnosis not present

## 2017-09-17 DIAGNOSIS — H16223 Keratoconjunctivitis sicca, not specified as Sjogren's, bilateral: Secondary | ICD-10-CM | POA: Diagnosis not present

## 2017-09-17 DIAGNOSIS — I10 Essential (primary) hypertension: Secondary | ICD-10-CM | POA: Diagnosis not present

## 2017-09-17 DIAGNOSIS — H02839 Dermatochalasis of unspecified eye, unspecified eyelid: Secondary | ICD-10-CM | POA: Diagnosis not present

## 2017-10-08 ENCOUNTER — Other Ambulatory Visit: Payer: Self-pay

## 2017-10-08 ENCOUNTER — Emergency Department (HOSPITAL_COMMUNITY): Payer: Medicare HMO

## 2017-10-08 ENCOUNTER — Encounter (HOSPITAL_COMMUNITY): Payer: Self-pay

## 2017-10-08 ENCOUNTER — Emergency Department (HOSPITAL_COMMUNITY)
Admission: EM | Admit: 2017-10-08 | Discharge: 2017-10-09 | Disposition: A | Discharge status: Home / Self Care | Payer: Medicare HMO | Attending: Emergency Medicine | Admitting: Emergency Medicine

## 2017-10-08 DIAGNOSIS — Z7901 Long term (current) use of anticoagulants: Secondary | ICD-10-CM | POA: Diagnosis not present

## 2017-10-08 DIAGNOSIS — J449 Chronic obstructive pulmonary disease, unspecified: Secondary | ICD-10-CM | POA: Diagnosis not present

## 2017-10-08 DIAGNOSIS — I712 Thoracic aortic aneurysm, without rupture: Secondary | ICD-10-CM | POA: Diagnosis not present

## 2017-10-08 DIAGNOSIS — M546 Pain in thoracic spine: Secondary | ICD-10-CM | POA: Insufficient documentation

## 2017-10-08 DIAGNOSIS — Z87891 Personal history of nicotine dependence: Secondary | ICD-10-CM | POA: Insufficient documentation

## 2017-10-08 DIAGNOSIS — I10 Essential (primary) hypertension: Secondary | ICD-10-CM | POA: Insufficient documentation

## 2017-10-08 DIAGNOSIS — M5489 Other dorsalgia: Secondary | ICD-10-CM | POA: Diagnosis present

## 2017-10-08 DIAGNOSIS — R0602 Shortness of breath: Secondary | ICD-10-CM | POA: Diagnosis not present

## 2017-10-08 DIAGNOSIS — Z7982 Long term (current) use of aspirin: Secondary | ICD-10-CM | POA: Insufficient documentation

## 2017-10-08 DIAGNOSIS — R109 Unspecified abdominal pain: Secondary | ICD-10-CM | POA: Diagnosis not present

## 2017-10-08 DIAGNOSIS — Z79899 Other long term (current) drug therapy: Secondary | ICD-10-CM | POA: Insufficient documentation

## 2017-10-08 DIAGNOSIS — I7121 Aneurysm of the ascending aorta, without rupture: Secondary | ICD-10-CM

## 2017-10-08 LAB — CBC
HEMATOCRIT: 37.2 % (ref 36.0–46.0)
Hemoglobin: 12.4 g/dL (ref 12.0–15.0)
MCH: 32.5 pg (ref 26.0–34.0)
MCHC: 33.3 g/dL (ref 30.0–36.0)
MCV: 97.6 fL (ref 78.0–100.0)
PLATELETS: 262 10*3/uL (ref 150–400)
RBC: 3.81 MIL/uL — ABNORMAL LOW (ref 3.87–5.11)
RDW: 14.3 % (ref 11.5–15.5)
WBC: 5 10*3/uL (ref 4.0–10.5)

## 2017-10-08 LAB — BASIC METABOLIC PANEL
Anion gap: 9 (ref 5–15)
BUN: 27 mg/dL — AB (ref 8–23)
CALCIUM: 10.1 mg/dL (ref 8.9–10.3)
CO2: 30 mmol/L (ref 22–32)
CREATININE: 1.06 mg/dL — AB (ref 0.44–1.00)
Chloride: 105 mmol/L (ref 98–111)
GFR calc Af Amer: 56 mL/min — ABNORMAL LOW (ref >60)
GFR calc non Af Amer: 49 mL/min — ABNORMAL LOW (ref >60)
GLUCOSE: 102 mg/dL — AB (ref 70–99)
Potassium: 3.9 mmol/L (ref 3.5–5.1)
Sodium: 144 mmol/L (ref 135–145)

## 2017-10-08 LAB — TROPONIN I: Troponin I: <0.03 ng/mL (ref <0.03)

## 2017-10-08 LAB — D-DIMER, QUANTITATIVE: D-Dimer, Quant: <0.27 ug/mL-FEU (ref 0.00–0.50)

## 2017-10-08 MED ORDER — SODIUM CHLORIDE 0.9 % IV SOLN
INTRAVENOUS | Status: DC
  Administered 2017-10-08: 22:00:00 via INTRAVENOUS

## 2017-10-08 MED ORDER — IOPAMIDOL (ISOVUE-370) INJECTION 76%
100.0000 mL | Freq: Once | INTRAVENOUS | Status: AC | PRN
  Administered 2017-10-08: 100 mL via INTRAVENOUS

## 2017-10-08 MED ORDER — IOPAMIDOL (ISOVUE-370) INJECTION 76%
INTRAVENOUS | Status: AC
  Filled 2017-10-08: qty 100

## 2017-10-08 NOTE — ED Triage Notes (Signed)
Pt states that she has been having back pain that originates from her right shoulder blade. Pt states that it is worse with movement.

## 2017-10-09 MED ORDER — TRAMADOL HCL 50 MG PO TABS: 50.0000 mg | ORAL_TABLET | Freq: Four times a day (QID) | ORAL | 0 refills | Status: DC | PRN

## 2017-10-09 NOTE — ED Provider Notes (Signed)
[facility] DEPT Provider Note   CSN: [id number] Arrival date & time: 10/08/17  1635     History   Chief Complaint Chief Complaint  Patient presents with  . Back Pain    HPI Denise Cline is a 77 y.o. female.  Patient with a complaint of pain around her right shoulder blade for about 2 days.  Worse with movement of the right arm.  Also worse with sitting up.  Patient has been working to clean out her deceased son's room.  But no definitive history of any injury.  Pain is located just to that area.  No anterior chest pain no anterior abdominal pain.  The patient is felt that she has had some additional abdominal distention over the last several weeks.  Patient is on Xarelto she has a history of DVT and PE.  Patient does have a primary care doctor is followed by [facility]     Past Medical History:  Diagnosis Date  . Anxiety   . COPD (chronic obstructive pulmonary disease) ([place])   . Fibroid   . History of shingles   . Hypertension   . Thyroid disease    Nodules  . Varicose veins     Patient Active Problem List   Diagnosis Date Noted  . DVT (deep venous thrombosis) ([place]) 11/12/2015  . Pulmonary emboli ([place]) 11/11/2015  . Acute pulmonary embolism ([place]) 11/11/2015  . Coronary artery calcification seen on CAT scan 05/12/2015  . Thoracic aortic aneurysm ([place]) 05/12/2015  . Dyspnea 03/17/2015  . Left leg swelling 03/16/2015  . Acute colitis 10/04/2014  . COPD (chronic obstructive pulmonary disease) ([place])   . PVD (peripheral vascular disease) ([place]) 04/01/2012  . Leg pain 04/01/2012  . Swelling of limb 04/01/2012  . History of shingles   . Hypertension   . Fibroid   . Thyroid disease   . Anxiety     Past Surgical History:  Procedure Laterality Date  . ABDOMINAL HYSTERECTOMY    . COLONOSCOPY N/A 10/05/2014   Procedure: COLONOSCOPY;  Surgeon: Ronald Lobo, MD;  Location: [facility] ENDOSCOPY;  Service: Endoscopy;  Laterality: N/A;  . OOPHORECTOMY      One ovary removed  . Thyroid nodules    . THYROID SURGERY       OB History    Gravida  3   Para  3   Term  3   Preterm      AB      Living  3     SAB      TAB      Ectopic      Multiple      Live Births               Home Medications    Prior to Admission medications   Medication Sig Start Date End Date Taking? Authorizing Provider  acetaminophen (TYLENOL) 650 MG CR tablet Take 650-1,300 mg by mouth 2 (two) times daily as needed for pain.   Yes [provider]  aspirin EC 81 MG tablet Take 81 mg by mouth daily.   Yes [provider]  buPROPion (WELLBUTRIN SR) 150 MG 12 hr tablet Take 150 mg by mouth daily.    Yes [provider]  calcium-vitamin D (OSCAL WITH D) 250-125 MG-UNIT tablet Take 2 tablets by mouth daily.   Yes [provider]  losartan-hydrochlorothiazide (HYZAAR) 100-25 MG tablet Take 1 tablet by mouth daily.   Yes [provider]  methocarbamol (ROBAXIN) 500  MG tablet Take 500 mg by mouth at bedtime. 07/08/17  Yes [provider]  Multiple Vitamins-Minerals (CENTRUM SILVER ADULT 50+) TABS Take 1 tablet by mouth daily.   Yes [provider]  OVER THE COUNTER MEDICATION Take 1 capsule by mouth at bedtime. (ColonClenz)   Yes [provider]  Polyvinyl Alcohol-Povidone (REFRESH OP) Place 1 drop into both eyes 3 (three) times daily.    Yes [provider]  rivaroxaban (XARELTO) 20 MG TABS tablet Take 20 mg by mouth daily with supper.   Yes [provider]  traMADol (ULTRAM) 50 MG tablet Take 1 tablet (50 mg total) by mouth every 6 (six) hours as needed for moderate pain. 08/23/15  Yes Mesner, Corene Cornea, MD  triamcinolone (KENALOG) 0.025 % cream Apply 1 application topically daily as needed for itching. 03/29/17  Yes [provider]  metoprolol succinate (TOPROL XL) 25 MG 24 hr tablet Take 1 tablet (25 mg total) by mouth daily. Patient not taking: Reported on  10/08/2017 05/30/17   Isaiah Serge, NP  traMADol (ULTRAM) 50 MG tablet Take 1 tablet (50 mg total) by mouth every 6 (six) hours as needed. 10/09/17   Fredia Sorrow, MD    Family History Family History  Problem Relation Age of Onset  . Crohn's disease Mother   . Stroke Father   . Diabetes Sister   . Hypertension Sister   . Hypertension Brother     Social History Social History   Tobacco Use  . Smoking status: Former Smoker    Packs/day: 2.00    Years: 40.00    Pack years: 80.00    Types: Cigarettes    Last attempt to quit: 05/30/2008    Years since quitting: 9.3  . Smokeless tobacco: Former Systems developer    Quit date: 05/12/2008  . Tobacco comment: Counseled to remain smoke free  Substance Use Topics  . Alcohol use: No    Alcohol/week: 0.0 oz    Comment: Rare  . Drug use: No     Allergies   Penicillins; Sulfa antibiotics; and Oxycodone   Review of Systems Review of Systems  Constitutional: Negative for fatigue.  HENT: Negative for congestion.   Eyes: Negative for visual disturbance.  Respiratory: Negative for shortness of breath.   Cardiovascular: Negative for chest pain.  Gastrointestinal: Negative for abdominal pain.  Genitourinary: Negative for dysuria.  Musculoskeletal: Positive for back pain. Negative for neck pain.  Skin: Negative for rash.  Neurological: Negative for weakness, numbness and headaches.  Psychiatric/Behavioral: Negative for confusion.     Physical Exam Updated Vital Signs BP (!) 183/91   Pulse (!) 49   Temp 97.6 F (36.4 C) (Oral)   Resp 13   Ht 1.676 m (5\' 6" )   Wt 90.7 kg (200 lb)   SpO2 100%   BMI 32.28 kg/m   Physical Exam  Constitutional: She is oriented to person, place, and time. She appears well-developed and well-nourished. No distress.  HENT:  Head: Normocephalic and atraumatic.  Mouth/Throat: Oropharynx is clear and moist.  Eyes: Pupils are equal, round, and reactive to light. Conjunctivae and EOM are normal.  Neck: Neck  supple.  Cardiovascular: Normal rate, regular rhythm and normal heart sounds.  Pulmonary/Chest: Effort normal and breath sounds normal. No respiratory distress. She exhibits no tenderness.  Abdominal: Soft. Bowel sounds are normal. She exhibits distension. There is no tenderness.  Musculoskeletal: Normal range of motion.  Pain to palpation around the base of the right scapula.  Tender to  palpation.  Worse with movement of the right arm.  No crepitance.  Neurological: She is alert and oriented to person, place, and time. No cranial nerve deficit or sensory deficit. She exhibits normal muscle tone. Coordination normal.  Skin: Skin is warm.  Nursing note and vitals reviewed.    ED Treatments / Results  Labs (all labs ordered are listed, but only abnormal results are displayed) Labs Reviewed  CBC - Abnormal; Notable for the following components:      Result Value   RBC 3.81 (*)    All other components within normal limits  BASIC METABOLIC PANEL - Abnormal; Notable for the following components:   Glucose, Bld 102 (*)    BUN 27 (*)    Creatinine, Ser 1.06 (*)    GFR calc non Af Amer 49 (*)    GFR calc Af Amer 56 (*)    All other components within normal limits  D-DIMER, QUANTITATIVE (NOT AT [facility])  TROPONIN I    EKG EKG Interpretation  Date/Time:  Tuesday October 08 2017 18:48:17 EDT Ventricular Rate:  61 PR Interval:    QRS Duration: 75 QT Interval:  429 QTC Calculation: 433 R Axis:   3 Text Interpretation:  Sinus rhythm Low voltage, precordial leads No significant change since last tracing Confirmed by Fredia Sorrow [phone]) on 10/08/2017 6:57:07 PM   Radiology Dg Chest 2 View  Result Date: 10/08/2017 CLINICAL DATA:  Shortness of breath. EXAM: CHEST - 2 VIEW COMPARISON:  Radiographs of June 12, 2016. FINDINGS: Stable cardiomegaly. No pneumothorax or pleural effusion is noted. Both lungs are clear. The visualized skeletal structures are unremarkable. IMPRESSION: No active  cardiopulmonary disease. Electronically Signed   By: Marijo Conception, M.D.   On: 10/08/2017 19:31   Ct Angio Chest Pe W/cm &/or Wo Cm  Result Date: 10/08/2017 CLINICAL DATA:  77 year old female with back pain originating from the right shoulder blade, worse with movement. Pain also noted of the abdomen starting posteriorly on the right commencing earlier today. EXAM: CT ANGIOGRAPHY CHEST CT ABDOMEN AND PELVIS WITH CONTRAST TECHNIQUE: Multidetector CT imaging of the chest was performed using the standard protocol during bolus administration of intravenous contrast. Multiplanar CT image reconstructions and MIPs were obtained to evaluate the vascular anatomy. Multidetector CT imaging of the abdomen and pelvis was performed using the standard protocol during bolus administration of intravenous contrast. CONTRAST:  166mL ISOVUE-370 IOPAMIDOL (ISOVUE-370) INJECTION 76% COMPARISON:  CT AP 10/04/2014, chest radiograph 10/08/2017 and chest CT 04/08/2017 FINDINGS: CTA CHEST FINDINGS Cardiovascular: 4.5 cm ascending thoracic aortic aneurysm with atherosclerotic calcifications noted. Preferential opacification of the pulmonary arteries without acute pulmonary embolus to the segmental level. Heart size is mildly enlarged with left main and three-vessel coronary arteriosclerosis. No pericardial effusion. Mediastinum/Nodes: No pathologically enlarged mediastinal nor hilar lymphadenopathy. Partially included axillary lymph loads are mildly enlarged but stable since prior measuring up to 1 cm short axis. Thyromegaly with enlargement of the right lobe extending retroclavicular. No dominant mass however is seen. Patent trachea and mainstem bronchi. The CT appearance of the esophagus is unremarkable. Small hiatal hernia. Lungs/Pleura: No pulmonary consolidation, effusion or pneumothorax. Passive atelectasis and subsegmental atelectasis noted at the lung bases. Punctate granuloma in the right lower lobe is unchanged. Pulmonary cysts  in the right lower lobe are redemonstrated. Musculoskeletal: No chest wall abnormality. No acute or significant osseous findings. Review of the MIP images confirms the above findings. CT ABDOMEN and PELVIS FINDINGS Hepatobiliary: 7 mm hypodensity in the left hepatic lobe  likely to represent a small cyst or hemangioma but is too small to further characterize. Physiologic distention of the gallbladder without wall thickening or calculi. No biliary dilatation is seen. Pancreas: Normal Spleen: Normal Adrenals/Urinary Tract: Normal bilateral adrenal glands. Small cysts of both kidneys the largest in the upper pole the left kidney measuring approximately 7 mm. No enhancing renal mass. No nephrolithiasis nor hydroureteronephrosis. The urinary bladder is unremarkable for the degree of distention. Stomach/Bowel: Decompressed stomach with normal duodenal sweep and ligament of Treitz position. No small bowel obstruction or inflammation. Moderate stool retention within the colon without obstruction or inflammation. The distal and terminal ileum are normal. Normal appearing appendix. Vascular/Lymphatic: Nonaneurysmal atherosclerotic aorta with patent branch vessels. No retroperitoneal, mesenteric or pelvic sidewall adenopathy. No inguinal adenopathy. Reproductive: Hysterectomy.  No adnexal mass. Other: No free air nor free fluid. Tiny fat containing umbilical hernia. Musculoskeletal: No acute or significant osseous findings. Review of the MIP images confirms the above findings. IMPRESSION: Chest CT: 1. 4.5 cm ascending thoracic aortic aneurysm with atherosclerosis. Ascending thoracic aortic aneurysm. Recommend semi-annual imaging followup by CTA or MRA and referral to cardiothoracic surgery if not already obtained. This recommendation follows 2010 ACCF/AHA/AATS/ACR/ASA/SCA/SCAI/SIR/STS/SVM Guidelines for the Diagnosis and Management of Patients With Thoracic Aortic Disease. Circulation. 2010; 121: I458-K998 previously this was  estimated 4.3 cm. 2. Coronary arteriosclerosis with stable cardiomegaly. 3. No acute pulmonary embolus. 4. No active pulmonary disease. 5. Mild thyromegaly. 6. No acute osseous abnormality. CT AP: 1. Tiny too small to characterize hepatic and renal hypodensities statistically consistent with cysts as above. 2. No acute bowel obstruction or inflammation. 3. Nonaneurysmal atherosclerotic abdominal aorta. Electronically Signed   By: Ashley Royalty M.D.   On: 10/08/2017 23:27   Ct Abdomen Pelvis W Contrast  Result Date: 10/08/2017 CLINICAL DATA:  77 year old female with back pain originating from the right shoulder blade, worse with movement. Pain also noted of the abdomen starting posteriorly on the right commencing earlier today. EXAM: CT ANGIOGRAPHY CHEST CT ABDOMEN AND PELVIS WITH CONTRAST TECHNIQUE: Multidetector CT imaging of the chest was performed using the standard protocol during bolus administration of intravenous contrast. Multiplanar CT image reconstructions and MIPs were obtained to evaluate the vascular anatomy. Multidetector CT imaging of the abdomen and pelvis was performed using the standard protocol during bolus administration of intravenous contrast. CONTRAST:  174mL ISOVUE-370 IOPAMIDOL (ISOVUE-370) INJECTION 76% COMPARISON:  CT AP 10/04/2014, chest radiograph 10/08/2017 and chest CT 04/08/2017 FINDINGS: CTA CHEST FINDINGS Cardiovascular: 4.5 cm ascending thoracic aortic aneurysm with atherosclerotic calcifications noted. Preferential opacification of the pulmonary arteries without acute pulmonary embolus to the segmental level. Heart size is mildly enlarged with left main and three-vessel coronary arteriosclerosis. No pericardial effusion. Mediastinum/Nodes: No pathologically enlarged mediastinal nor hilar lymphadenopathy. Partially included axillary lymph loads are mildly enlarged but stable since prior measuring up to 1 cm short axis. Thyromegaly with enlargement of the right lobe extending  retroclavicular. No dominant mass however is seen. Patent trachea and mainstem bronchi. The CT appearance of the esophagus is unremarkable. Small hiatal hernia. Lungs/Pleura: No pulmonary consolidation, effusion or pneumothorax. Passive atelectasis and subsegmental atelectasis noted at the lung bases. Punctate granuloma in the right lower lobe is unchanged. Pulmonary cysts in the right lower lobe are redemonstrated. Musculoskeletal: No chest wall abnormality. No acute or significant osseous findings. Review of the MIP images confirms the above findings. CT ABDOMEN and PELVIS FINDINGS Hepatobiliary: 7 mm hypodensity in the left hepatic lobe likely to represent a small cyst or hemangioma but  is too small to further characterize. Physiologic distention of the gallbladder without wall thickening or calculi. No biliary dilatation is seen. Pancreas: Normal Spleen: Normal Adrenals/Urinary Tract: Normal bilateral adrenal glands. Small cysts of both kidneys the largest in the upper pole the left kidney measuring approximately 7 mm. No enhancing renal mass. No nephrolithiasis nor hydroureteronephrosis. The urinary bladder is unremarkable for the degree of distention. Stomach/Bowel: Decompressed stomach with normal duodenal sweep and ligament of Treitz position. No small bowel obstruction or inflammation. Moderate stool retention within the colon without obstruction or inflammation. The distal and terminal ileum are normal. Normal appearing appendix. Vascular/Lymphatic: Nonaneurysmal atherosclerotic aorta with patent branch vessels. No retroperitoneal, mesenteric or pelvic sidewall adenopathy. No inguinal adenopathy. Reproductive: Hysterectomy.  No adnexal mass. Other: No free air nor free fluid. Tiny fat containing umbilical hernia. Musculoskeletal: No acute or significant osseous findings. Review of the MIP images confirms the above findings. IMPRESSION: Chest CT: 1. 4.5 cm ascending thoracic aortic aneurysm with  atherosclerosis. Ascending thoracic aortic aneurysm. Recommend semi-annual imaging followup by CTA or MRA and referral to cardiothoracic surgery if not already obtained. This recommendation follows 2010 ACCF/AHA/AATS/ACR/ASA/SCA/SCAI/SIR/STS/SVM Guidelines for the Diagnosis and Management of Patients With Thoracic Aortic Disease. Circulation. 2010; 121: Y503-T465 previously this was estimated 4.3 cm. 2. Coronary arteriosclerosis with stable cardiomegaly. 3. No acute pulmonary embolus. 4. No active pulmonary disease. 5. Mild thyromegaly. 6. No acute osseous abnormality. CT AP: 1. Tiny too small to characterize hepatic and renal hypodensities statistically consistent with cysts as above. 2. No acute bowel obstruction or inflammation. 3. Nonaneurysmal atherosclerotic abdominal aorta. Electronically Signed   By: Ashley Royalty M.D.   On: 10/08/2017 23:27    Procedures Procedures (including critical care time)  Medications Ordered in ED Medications  0.9 %  sodium chloride infusion ( Intravenous New Bag/Given 10/08/17 2227)  iopamidol (ISOVUE-370) 76 % injection (has no administration in time range)  iopamidol (ISOVUE-370) 76 % injection 100 mL (100 mLs Intravenous Contrast Given 10/08/17 2237)     Initial Impression / Assessment and Plan / ED Course  I have reviewed the triage vital signs and the nursing notes.  Pertinent labs & imaging results that were available during my care of the patient were reviewed by me and considered in my medical decision making (see chart for details).    Patient's work-up here was extensive.  Patient work-up included CT Angie of chest and CT abdomen and pelvis due to distention.  Patient's d-dimer was negative but she has had a past history of pulmonary embolus.  CT did show a 4.5 cm ascending thoracic aneurysm.  I do not feel this is the cause of the pain around her right scapula which is reproducible and worse with movement of her right arm I feel that that is  musculoskeletal.  I think this is an incidental finding.  Obviously will need close follow-up with her primary care doctor and cardiothoracic surgery.  Patient's CT results printed and provided to her.  She will call her primary care doctor for follow-up tomorrow.  Patient will need follow-up with cardiothoracic surgery probably twice a year.  For the musculoskeletal posterior thoracic back pain patient will be treated with tramadol.  Suspect this is related to a lot of the packing out that she is done of her son's room.  In addition patient's troponin was negative so no concerns for an acute cardiac event.  Patient without any hypoxia.  And the CT angios did rule out pulmonary embolus.  Final Clinical  Impressions(s) / ED Diagnoses   Final diagnoses:  Acute right-sided thoracic back pain  Thoracic ascending aortic aneurysm [facility])    ED Discharge Orders        Ordered    traMADol (ULTRAM) 50 MG tablet  Every 6 hours PRN     10/09/17 0008       Fredia Sorrow, MD 10/09/17 0022

## 2017-10-09 NOTE — Discharge Instructions (Signed)
Important to follow-up with your regular doctor and will also need some follow-up with cardiothoracic surgery for the incidental finding of the a sending thoracic aneurysm.  This will require follow-up twice a year.  It is not related to your symptoms that brought you here today.  Feel that that is related to a muscular strain around her shoulder blade.  Rest of your work-up was negative.  Take the tramadol as needed for pain.

## 2017-10-16 DIAGNOSIS — I2510 Atherosclerotic heart disease of native coronary artery without angina pectoris: Secondary | ICD-10-CM | POA: Diagnosis not present

## 2017-10-16 DIAGNOSIS — I712 Thoracic aortic aneurysm, without rupture: Secondary | ICD-10-CM | POA: Diagnosis not present

## 2017-10-16 DIAGNOSIS — R350 Frequency of micturition: Secondary | ICD-10-CM | POA: Diagnosis not present

## 2017-10-16 DIAGNOSIS — R69 Illness, unspecified: Secondary | ICD-10-CM | POA: Diagnosis not present

## 2017-10-16 DIAGNOSIS — R079 Chest pain, unspecified: Secondary | ICD-10-CM | POA: Diagnosis not present

## 2017-10-16 DIAGNOSIS — J449 Chronic obstructive pulmonary disease, unspecified: Secondary | ICD-10-CM | POA: Diagnosis not present

## 2017-10-16 DIAGNOSIS — M169 Osteoarthritis of hip, unspecified: Secondary | ICD-10-CM | POA: Diagnosis not present

## 2017-10-16 DIAGNOSIS — M179 Osteoarthritis of knee, unspecified: Secondary | ICD-10-CM | POA: Diagnosis not present

## 2017-10-16 DIAGNOSIS — L282 Other prurigo: Secondary | ICD-10-CM | POA: Diagnosis not present

## 2017-10-20 NOTE — Progress Notes (Signed)
Cardiology Office Note:    Date:  10/21/2017   ID:  Denise Cline, DOB 12/14/1940, MRN [id number]  PCP:  Josetta Huddle, MD  Cardiologist:  No primary care provider on file.   Referring MD: Josetta Huddle, MD   Chief Complaint  Patient presents with  . Coronary Artery Disease  . Thoracic Aortic Aneurysm    History of Present Illness:    Denise Cline is a 77 y.o. female with a hx of CAD identified by screening CT scan due to the presence of calcification, ascending thoracic aneurysm, COPD, essential hypertension, PVD, and concomitant COPD.  She is accompanied by a son who purports a medical background.  They are concerned that she has had recurring episodes of abdominal bloating and back discomfort.  A recent emergency room visit occurred because of this particular concern.  This visit occurred on 10/08/2017 and included an extensive work-up including a CT Angie oh to rule out PE the back discomfort was felt to be musculoskeletal  Past Medical History:  Diagnosis Date  . Anxiety   . COPD (chronic obstructive pulmonary disease) ([place])   . Fibroid   . History of shingles   . Hypertension   . Thyroid disease    Nodules  . Varicose veins     Past Surgical History:  Procedure Laterality Date  . ABDOMINAL HYSTERECTOMY    . COLONOSCOPY N/A 10/05/2014   Procedure: COLONOSCOPY;  Surgeon: Ronald Lobo, MD;  Location: [facility];  Service: Endoscopy;  Laterality: N/A;  . OOPHORECTOMY     One ovary removed  . Thyroid nodules    . THYROID SURGERY      Current Medications: Current Meds  Medication Sig  . acetaminophen (TYLENOL) 650 MG CR tablet Take 650-1,300 mg by mouth 2 (two) times daily as needed for pain.  Marland Kitchen aspirin EC 81 MG tablet Take 81 mg by mouth daily.  Marland Kitchen buPROPion (WELLBUTRIN SR) 150 MG 12 hr tablet Take 150 mg by mouth daily.   . calcium-vitamin D (OSCAL WITH D) 250-125 MG-UNIT tablet Take 2 tablets by mouth daily.  Marland Kitchen losartan-hydrochlorothiazide (HYZAAR)  100-25 MG tablet Take 1 tablet by mouth daily.  . methocarbamol (ROBAXIN) 500 MG tablet Take 500 mg by mouth at bedtime.  . Multiple Vitamins-Minerals (CENTRUM SILVER ADULT 50+) TABS Take 1 tablet by mouth daily.  Marland Kitchen OVER THE COUNTER MEDICATION Take 1 capsule by mouth at bedtime. (ColonClenz)  . Polyvinyl Alcohol-Povidone (REFRESH OP) Place 1 drop into both eyes 3 (three) times daily.   . rivaroxaban (XARELTO) 20 MG TABS tablet Take 20 mg by mouth daily with supper.  . traMADol (ULTRAM) 50 MG tablet Take 1 tablet (50 mg total) by mouth every 6 (six) hours as needed for moderate pain.  . traMADol (ULTRAM) 50 MG tablet Take 1 tablet (50 mg total) by mouth every 6 (six) hours as needed.  . triamcinolone (KENALOG) 0.025 % cream Apply 1 application topically daily as needed for itching.     Allergies:   Penicillins; Sulfa antibiotics; and Oxycodone   Social History   Socioeconomic History  . Marital status: Divorced    Spouse name: Not on file  . Number of children: Not on file  . Years of education: Not on file  . Highest education level: Not on file  Occupational History  . Not on file  Social Needs  . Financial resource strain: Not on file  . Food insecurity:    Worry: Not on file    Inability:  Not on file  . Transportation needs:    Medical: Not on file    Non-medical: Not on file  Tobacco Use  . Smoking status: Former Smoker    Packs/day: 2.00    Years: 40.00    Pack years: 80.00    Types: Cigarettes    Last attempt to quit: 05/30/2008    Years since quitting: 9.4  . Smokeless tobacco: Former Systems developer    Quit date: 05/12/2008  . Tobacco comment: Counseled to remain smoke free  Substance and Sexual Activity  . Alcohol use: No    Alcohol/week: 0.0 standard drinks    Comment: Rare  . Drug use: No  . Sexual activity: Never    Birth control/protection: Surgical  Lifestyle  . Physical activity:    Days per week: Not on file    Minutes per session: Not on file  . Stress: Not on  file  Relationships  . Social connections:    Talks on phone: Not on file    Gets together: Not on file    Attends religious service: Not on file    Active member of club or organization: Not on file    Attends meetings of clubs or organizations: Not on file    Relationship status: Not on file  Other Topics Concern  . Not on file  Social History Narrative  . Not on file     Family History: The patient's family history includes Crohn's disease in her mother; Diabetes in her sister; Hypertension in her brother and sister; Stroke in her father.  ROS:   Please see the history of present illness.    Hearing loss.  Excessive sweating.  All other systems reviewed and are negative.  Change in appetite.  Recent chills.  Leg swelling.  EKGs/Labs/Other Studies Reviewed:    The following studies were reviewed today:  CT Angio Thoracic Aorta 10/2017:  1. 4.5 cm ascending thoracic aortic aneurysm with atherosclerosis. Ascending thoracic aortic aneurysm. Recommend semi-annual imaging followup by CTA or MRA and referral to cardiothoracic surgery if not already obtained. This recommendation follows 2010 ACCF/AHA/AATS/ACR/ASA/SCA/SCAI/SIR/STS/SVM Guidelines for the Diagnosis and Management of Patients With Thoracic Aortic Disease. Circulation. 2010; 121: Y865-H846 previously this was estimated 4.3 cm. 2. Coronary arteriosclerosis with stable cardiomegaly. 3. No acute pulmonary embolus. 4. No active pulmonary disease. 5. Mild thyromegaly. 6. No acute osseous abnormality.   2D Doppler echocardiogram March 2019: Study Conclusions  - Left ventricle: The cavity size was normal. Wall thickness was   increased in a pattern of mild LVH. Systolic function was   vigorous. The estimated ejection fraction was in the range of 65%   to 70%. Wall motion was normal; there were no regional wall   motion abnormalities. Doppler parameters are consistent with   abnormal left ventricular relaxation  (grade 1 diastolic   dysfunction). - Aortic valve: There was no stenosis. There was trivial   regurgitation. - Aorta: Mildly dilated aortic root and ascending aorta. Aortic   root diameter: 40 mm. Ascending aortic diameter: 42 mm (S). - Mitral valve: There was trivial regurgitation. - Right ventricle: The cavity size was normal. Systolic function   was normal. - Tricuspid valve: Peak RV-RA gradient (S): 29 mm Hg. - Pulmonary arteries: PA peak pressure: 32 mm Hg (S). - Inferior vena cava: The vessel was normal in size. The   respirophasic diameter changes were in the normal range (>= 50%),   consistent with normal central venous pressure.  Impressions:  - Normal LV  size with mild LV hypertrophy. EF 65-70%. Normal RV   size and systolic function. No significant valvular   abnormalities. Mildly dilated aortic root and ascending aorta.   MYOCARDIAL perfusion imaging 05/16/2017: Study Highlights     The left ventricular ejection fraction is mildly decreased (45-54%).  Nuclear stress EF: 52%.  No T wave inversion was noted during stress.  There was no ST segment deviation noted during stress.  The study is normal.  This is a low risk study.   Normal perfusion. LVEF 52% with normal wall motion. This is a low risk study.      EKG:  EKG is not ordered today.   Recent Labs: 04/03/2017: ALT 20 10/08/2017: BUN 27; Creatinine, Ser 1.06; Hemoglobin 12.4; Platelets 262; Potassium 3.9; Sodium 144  Recent Lipid Panel    Component Value Date/Time   CHOL  05/15/2009 0555    187        ATP III CLASSIFICATION:  <200     mg/dL   Desirable  200-239  mg/dL   Borderline High  >=240    mg/dL   High          TRIG 112 05/15/2009 0555   HDL 51 05/15/2009 0555   CHOLHDL 3.7 05/15/2009 0555   VLDL 22 05/15/2009 0555   LDLCALC (H) 05/15/2009 0555    114        Total Cholesterol/HDL:CHD Risk Coronary Heart Disease Risk Table                     Men   Women  1/2 Average Risk   3.4    3.3  Average Risk       5.0   4.4  2 X Average Risk   9.6   7.1  3 X Average Risk  23.4   11.0        Use the calculated Patient Ratio above and the CHD Risk Table to determine the patient's CHD Risk.        ATP III CLASSIFICATION (LDL):  <100     mg/dL   Optimal  100-129  mg/dL   Near or Above                    Optimal  130-159  mg/dL   Borderline  160-189  mg/dL   High  >190     mg/dL   Very High    Physical Exam:    VS:  BP 126/84   Pulse 68   Ht 5\' 6"  (1.676 m)   Wt 203 lb (92.1 kg)   BMI 32.77 kg/m     Wt Readings from Last 3 Encounters:  10/21/17 203 lb (92.1 kg)  10/08/17 200 lb (90.7 kg)  05/30/17 203 lb 6.4 oz (92.3 kg)     GEN:  Well nourished, well developed in no acute distress HEENT: Normal NECK: No JVD. LYMPHATICS: No lymphadenopathy CARDIAC: RRR, no murmur, nogallop, no  edema. VASCULAR: 2+ carotid and radial bilateral pulses.  No bruits. RESPIRATORY:  Clear to auscultation without rales, wheezing or rhonchi  ABDOMEN: Soft, non-tender, non-distended, No pulsatile mass, MUSCULOSKELETAL: No deformity  SKIN: Warm and dry NEUROLOGIC:  Alert and oriented x 3 PSYCHIATRIC:  Normal affect   ASSESSMENT:    1. Coronary artery calcification seen on CAT scan   2. Thoracic aortic aneurysm without rupture ([place])   3. PVD (peripheral vascular disease) ([place])   4. Claudication ([place])   5. Essential hypertension  6. DOE (dyspnea on exertion)   7. Pulmonary embolism without acute cor pulmonale, unspecified chronicity, unspecified pulmonary embolism type ([place])   8. Chronic bronchitis, unspecified chronic bronchitis type ([place])    PLAN:    In order of problems listed above:  1. Coronary calcification noted on chest CT.  Nuclear myocardial perfusion study low risk in March 2019.  Risk factor modification to prevent progression including: A1c less than 7, LDL less than 70, blood pressure 130/80 mmHg or less. 2. Repeat chest CT with contrast. 3. Not  addressed 4. Target blood pressure 130/80 mmHg or less.  She was unable to tolerate beta-blocker therapy.  Continue current intensity losartan HCT. 5. Continue Xarelto.  Decrease aspirin to 81 mg Monday Wednesday and Friday.   Repeat chest CT with angiography in 6 months to follow-up aortic root size.  73-month clinical follow-up.   Medication Adjustments/Labs and Tests Ordered: Current medicines are reviewed at length with the patient today.  Concerns regarding medicines are outlined above.  Orders Placed This Encounter  Procedures  . CT ANGIO CHEST AORTA W &/OR WO CONTRAST  . Lipid Profile   Meds ordered this encounter  Medications  . pantoprazole (PROTONIX) 40 MG tablet    Sig: Take 1 tablet (40 mg total) by mouth daily.    Dispense:  90 tablet    Refill:  3    Patient Instructions  Medication Instructions:  1. START PROTONIX 40 MG DAILY; RX HAS BEEN SENT IN  Labwork: TODAY LIPIDS  Testing/Procedures: CHEST CT-A; THIS IS TO BE DONE 6 MONTHS FROM RECENT CT DONE 10/08/17  Follow-Up: DR. Tamala Julian IN 6 MONTHS AFTER THE CT HAS BEEN COMPLETED  Any Other Special Instructions Will Be Listed Below (If Applicable).     If you need a refill on your cardiac medications before your next appointment, please call your pharmacy.      Signed, Sinclair Grooms, MD  10/21/2017 1:20 PM    [facility]

## 2017-10-21 ENCOUNTER — Encounter: Payer: Self-pay | Admitting: Interventional Cardiology

## 2017-10-21 ENCOUNTER — Ambulatory Visit (INDEPENDENT_AMBULATORY_CARE_PROVIDER_SITE_OTHER): Payer: Medicare HMO | Admitting: Interventional Cardiology

## 2017-10-21 VITALS — BP 126/84 | HR 68 | Ht 66.0 in | Wt 203.0 lb

## 2017-10-21 DIAGNOSIS — I712 Thoracic aortic aneurysm, without rupture, unspecified: Secondary | ICD-10-CM

## 2017-10-21 DIAGNOSIS — I2510 Atherosclerotic heart disease of native coronary artery without angina pectoris: Secondary | ICD-10-CM

## 2017-10-21 DIAGNOSIS — I739 Peripheral vascular disease, unspecified: Secondary | ICD-10-CM

## 2017-10-21 DIAGNOSIS — I2699 Other pulmonary embolism without acute cor pulmonale: Secondary | ICD-10-CM | POA: Diagnosis not present

## 2017-10-21 DIAGNOSIS — I10 Essential (primary) hypertension: Secondary | ICD-10-CM | POA: Diagnosis not present

## 2017-10-21 LAB — LIPID PANEL
CHOLESTEROL TOTAL: 226 mg/dL — AB (ref 100–199)
Chol/HDL Ratio: 2.9 ratio (ref 0.0–4.4)
HDL: 77 mg/dL (ref >39)
LDL CALC: 133 mg/dL — AB (ref 0–99)
Triglycerides: 78 mg/dL (ref 0–149)
VLDL CHOLESTEROL CAL: 16 mg/dL (ref 5–40)

## 2017-10-21 MED ORDER — PANTOPRAZOLE SODIUM 40 MG PO TBEC: 40.0000 mg | DELAYED_RELEASE_TABLET | Freq: Every day | ORAL | 3 refills | Status: DC

## 2017-10-21 NOTE — Patient Instructions (Signed)
Medication Instructions:  1. START PROTONIX 40 MG DAILY; RX HAS BEEN SENT IN  Labwork: TODAY LIPIDS  Testing/Procedures: CHEST CT-A; THIS IS TO BE DONE 6 MONTHS FROM RECENT CT DONE 10/08/17  Follow-Up: DR. Tamala Julian IN 6 MONTHS AFTER THE CT HAS BEEN COMPLETED  Any Other Special Instructions Will Be Listed Below (If Applicable).     If you need a refill on your cardiac medications before your next appointment, please call your pharmacy.

## 2017-10-23 DIAGNOSIS — R69 Illness, unspecified: Secondary | ICD-10-CM | POA: Diagnosis not present

## 2017-10-23 DIAGNOSIS — F4312 Post-traumatic stress disorder, chronic: Secondary | ICD-10-CM | POA: Diagnosis not present

## 2017-10-25 ENCOUNTER — Other Ambulatory Visit: Payer: Self-pay

## 2017-10-25 DIAGNOSIS — Z79899 Other long term (current) drug therapy: Secondary | ICD-10-CM

## 2017-10-25 DIAGNOSIS — E785 Hyperlipidemia, unspecified: Secondary | ICD-10-CM

## 2017-10-25 MED ORDER — ROSUVASTATIN CALCIUM 20 MG PO TABS
20.0000 mg | ORAL_TABLET | Freq: Every day | ORAL | 3 refills | Status: DC
Start: 2017-10-25 — End: 2017-10-25

## 2017-10-25 MED ORDER — ROSUVASTATIN CALCIUM 20 MG PO TABS: 20.0000 mg | ORAL_TABLET | Freq: Every day | ORAL | 3 refills | Status: DC

## 2017-11-02 ENCOUNTER — Other Ambulatory Visit (HOSPITAL_COMMUNITY): Payer: Medicare HMO

## 2017-11-02 ENCOUNTER — Other Ambulatory Visit: Payer: Self-pay

## 2017-11-02 ENCOUNTER — Inpatient Hospital Stay (HOSPITAL_COMMUNITY)
Admission: EM | Admit: 2017-11-02 | Discharge: 2017-11-08 | DRG: 417 | Disposition: A | Discharge status: Home / Self Care | Payer: Medicare HMO | Attending: Internal Medicine | Admitting: Internal Medicine

## 2017-11-02 ENCOUNTER — Encounter (HOSPITAL_COMMUNITY): Payer: Self-pay | Admitting: Radiology

## 2017-11-02 ENCOUNTER — Emergency Department (HOSPITAL_COMMUNITY): Payer: Medicare HMO

## 2017-11-02 DIAGNOSIS — Z79891 Long term (current) use of opiate analgesic: Secondary | ICD-10-CM

## 2017-11-02 DIAGNOSIS — Z86718 Personal history of other venous thrombosis and embolism: Secondary | ICD-10-CM | POA: Diagnosis not present

## 2017-11-02 DIAGNOSIS — N179 Acute kidney failure, unspecified: Secondary | ICD-10-CM | POA: Diagnosis present

## 2017-11-02 DIAGNOSIS — K838 Other specified diseases of biliary tract: Secondary | ICD-10-CM | POA: Diagnosis not present

## 2017-11-02 DIAGNOSIS — E785 Hyperlipidemia, unspecified: Secondary | ICD-10-CM | POA: Diagnosis present

## 2017-11-02 DIAGNOSIS — K8689 Other specified diseases of pancreas: Secondary | ICD-10-CM | POA: Diagnosis not present

## 2017-11-02 DIAGNOSIS — K5900 Constipation, unspecified: Secondary | ICD-10-CM | POA: Diagnosis present

## 2017-11-02 DIAGNOSIS — Z90721 Acquired absence of ovaries, unilateral: Secondary | ICD-10-CM

## 2017-11-02 DIAGNOSIS — K831 Obstruction of bile duct: Secondary | ICD-10-CM | POA: Diagnosis not present

## 2017-11-02 DIAGNOSIS — K8043 Calculus of bile duct with acute cholecystitis with obstruction: Secondary | ICD-10-CM | POA: Diagnosis present

## 2017-11-02 DIAGNOSIS — R001 Bradycardia, unspecified: Secondary | ICD-10-CM | POA: Diagnosis not present

## 2017-11-02 DIAGNOSIS — K851 Biliary acute pancreatitis without necrosis or infection: Secondary | ICD-10-CM | POA: Diagnosis not present

## 2017-11-02 DIAGNOSIS — E079 Disorder of thyroid, unspecified: Secondary | ICD-10-CM | POA: Diagnosis present

## 2017-11-02 DIAGNOSIS — N183 Chronic kidney disease, stage 3 unspecified: Secondary | ICD-10-CM | POA: Diagnosis present

## 2017-11-02 DIAGNOSIS — F419 Anxiety disorder, unspecified: Secondary | ICD-10-CM | POA: Diagnosis present

## 2017-11-02 DIAGNOSIS — M25512 Pain in left shoulder: Secondary | ICD-10-CM | POA: Diagnosis not present

## 2017-11-02 DIAGNOSIS — K8050 Calculus of bile duct without cholangitis or cholecystitis without obstruction: Secondary | ICD-10-CM | POA: Diagnosis not present

## 2017-11-02 DIAGNOSIS — I712 Thoracic aortic aneurysm, without rupture, unspecified: Secondary | ICD-10-CM | POA: Diagnosis present

## 2017-11-02 DIAGNOSIS — R935 Abnormal findings on diagnostic imaging of other abdominal regions, including retroperitoneum: Secondary | ICD-10-CM | POA: Diagnosis not present

## 2017-11-02 DIAGNOSIS — Z01810 Encounter for preprocedural cardiovascular examination: Secondary | ICD-10-CM | POA: Diagnosis not present

## 2017-11-02 DIAGNOSIS — R69 Illness, unspecified: Secondary | ICD-10-CM | POA: Diagnosis not present

## 2017-11-02 DIAGNOSIS — Z7901 Long term (current) use of anticoagulants: Secondary | ICD-10-CM

## 2017-11-02 DIAGNOSIS — D631 Anemia in chronic kidney disease: Secondary | ICD-10-CM | POA: Diagnosis present

## 2017-11-02 DIAGNOSIS — K8010 Calculus of gallbladder with chronic cholecystitis without obstruction: Secondary | ICD-10-CM | POA: Diagnosis not present

## 2017-11-02 DIAGNOSIS — G8929 Other chronic pain: Secondary | ICD-10-CM | POA: Diagnosis present

## 2017-11-02 DIAGNOSIS — Z87891 Personal history of nicotine dependence: Secondary | ICD-10-CM

## 2017-11-02 DIAGNOSIS — K8063 Calculus of gallbladder and bile duct with acute cholecystitis with obstruction: Secondary | ICD-10-CM | POA: Diagnosis not present

## 2017-11-02 DIAGNOSIS — E876 Hypokalemia: Secondary | ICD-10-CM | POA: Diagnosis not present

## 2017-11-02 DIAGNOSIS — Z6832 Body mass index (BMI) 32.0-32.9, adult: Secondary | ICD-10-CM | POA: Diagnosis not present

## 2017-11-02 DIAGNOSIS — E890 Postprocedural hypothyroidism: Secondary | ICD-10-CM | POA: Diagnosis present

## 2017-11-02 DIAGNOSIS — I2510 Atherosclerotic heart disease of native coronary artery without angina pectoris: Secondary | ICD-10-CM | POA: Diagnosis present

## 2017-11-02 DIAGNOSIS — Z7982 Long term (current) use of aspirin: Secondary | ICD-10-CM | POA: Diagnosis not present

## 2017-11-02 DIAGNOSIS — I129 Hypertensive chronic kidney disease with stage 1 through stage 4 chronic kidney disease, or unspecified chronic kidney disease: Secondary | ICD-10-CM | POA: Diagnosis present

## 2017-11-02 DIAGNOSIS — J449 Chronic obstructive pulmonary disease, unspecified: Secondary | ICD-10-CM | POA: Diagnosis present

## 2017-11-02 DIAGNOSIS — I739 Peripheral vascular disease, unspecified: Secondary | ICD-10-CM | POA: Diagnosis not present

## 2017-11-02 DIAGNOSIS — Z885 Allergy status to narcotic agent status: Secondary | ICD-10-CM

## 2017-11-02 DIAGNOSIS — Z90710 Acquired absence of both cervix and uterus: Secondary | ICD-10-CM | POA: Diagnosis not present

## 2017-11-02 DIAGNOSIS — Z8249 Family history of ischemic heart disease and other diseases of the circulatory system: Secondary | ICD-10-CM

## 2017-11-02 DIAGNOSIS — Z882 Allergy status to sulfonamides status: Secondary | ICD-10-CM

## 2017-11-02 DIAGNOSIS — Z86711 Personal history of pulmonary embolism: Secondary | ICD-10-CM

## 2017-11-02 DIAGNOSIS — I10 Essential (primary) hypertension: Secondary | ICD-10-CM | POA: Diagnosis not present

## 2017-11-02 DIAGNOSIS — G4733 Obstructive sleep apnea (adult) (pediatric): Secondary | ICD-10-CM | POA: Diagnosis not present

## 2017-11-02 DIAGNOSIS — Z419 Encounter for procedure for purposes other than remedying health state, unspecified: Secondary | ICD-10-CM

## 2017-11-02 DIAGNOSIS — Z880 Allergy status to penicillin: Secondary | ICD-10-CM

## 2017-11-02 DIAGNOSIS — K8020 Calculus of gallbladder without cholecystitis without obstruction: Secondary | ICD-10-CM | POA: Diagnosis not present

## 2017-11-02 DIAGNOSIS — Z79899 Other long term (current) drug therapy: Secondary | ICD-10-CM

## 2017-11-02 DIAGNOSIS — K3189 Other diseases of stomach and duodenum: Secondary | ICD-10-CM | POA: Diagnosis not present

## 2017-11-02 DIAGNOSIS — K219 Gastro-esophageal reflux disease without esophagitis: Secondary | ICD-10-CM | POA: Diagnosis not present

## 2017-11-02 DIAGNOSIS — E669 Obesity, unspecified: Secondary | ICD-10-CM | POA: Diagnosis present

## 2017-11-02 DIAGNOSIS — K859 Acute pancreatitis without necrosis or infection, unspecified: Secondary | ICD-10-CM

## 2017-11-02 DIAGNOSIS — J42 Unspecified chronic bronchitis: Secondary | ICD-10-CM | POA: Diagnosis not present

## 2017-11-02 HISTORY — DX: Acute embolism and thrombosis of unspecified deep veins of unspecified lower extremity: I82.409

## 2017-11-02 HISTORY — DX: Other pulmonary embolism without acute cor pulmonale: I26.99

## 2017-11-02 LAB — COMPREHENSIVE METABOLIC PANEL
ALBUMIN: 3.2 g/dL — AB (ref 3.5–5.0)
ALK PHOS: 58 U/L (ref 38–126)
ALT: 35 U/L (ref 0–44)
ANION GAP: 10 (ref 5–15)
AST: 30 U/L (ref 15–41)
BILIRUBIN TOTAL: 0.8 mg/dL (ref 0.3–1.2)
BUN: 28 mg/dL — AB (ref 8–23)
CALCIUM: 8.8 mg/dL — AB (ref 8.9–10.3)
CO2: 27 mmol/L (ref 22–32)
Chloride: 103 mmol/L (ref 98–111)
Creatinine, Ser: 1.64 mg/dL — ABNORMAL HIGH (ref 0.44–1.00)
GFR calc Af Amer: 33 mL/min — ABNORMAL LOW (ref >60)
GFR, EST NON AFRICAN AMERICAN: 29 mL/min — AB (ref >60)
GLUCOSE: 111 mg/dL — AB (ref 70–99)
Potassium: 3.4 mmol/L — ABNORMAL LOW (ref 3.5–5.1)
Sodium: 140 mmol/L (ref 135–145)
TOTAL PROTEIN: 6.6 g/dL (ref 6.5–8.1)

## 2017-11-02 LAB — I-STAT TROPONIN, ED: TROPONIN I, POC: 0.01 ng/mL (ref 0.00–0.08)

## 2017-11-02 LAB — I-STAT CHEM 8, ED
BUN: 35 mg/dL — ABNORMAL HIGH (ref 8–23)
Calcium, Ion: 1.11 mmol/L — ABNORMAL LOW (ref 1.15–1.40)
Chloride: 100 mmol/L (ref 98–111)
Creatinine, Ser: 1.7 mg/dL — ABNORMAL HIGH (ref 0.44–1.00)
Glucose, Bld: 107 mg/dL — ABNORMAL HIGH (ref 70–99)
HEMATOCRIT: 36 % (ref 36.0–46.0)
Hemoglobin: 12.2 g/dL (ref 12.0–15.0)
Potassium: 3.5 mmol/L (ref 3.5–5.1)
Sodium: 140 mmol/L (ref 135–145)
TCO2: 30 mmol/L (ref 22–32)

## 2017-11-02 LAB — URINALYSIS, ROUTINE W REFLEX MICROSCOPIC
BACTERIA UA: NONE SEEN
Bilirubin Urine: NEGATIVE
Glucose, UA: NEGATIVE mg/dL
HGB URINE DIPSTICK: NEGATIVE
Ketones, ur: NEGATIVE mg/dL
NITRITE: NEGATIVE
PROTEIN: NEGATIVE mg/dL
SPECIFIC GRAVITY, URINE: 1.011 (ref 1.005–1.030)
pH: 5 (ref 5.0–8.0)

## 2017-11-02 LAB — I-STAT CG4 LACTIC ACID, ED
Lactic Acid, Venous: 1.49 mmol/L (ref 0.5–1.9)
Lactic Acid, Venous: 0.78 mmol/L (ref 0.5–1.9)

## 2017-11-02 LAB — CBC
HEMATOCRIT: 37.5 % (ref 36.0–46.0)
HEMOGLOBIN: 12.1 g/dL (ref 12.0–15.0)
MCH: 32.4 pg (ref 26.0–34.0)
MCHC: 32.3 g/dL (ref 30.0–36.0)
MCV: 100.3 fL — ABNORMAL HIGH (ref 78.0–100.0)
Platelets: 249 10*3/uL (ref 150–400)
RBC: 3.74 MIL/uL — AB (ref 3.87–5.11)
RDW: 13.5 % (ref 11.5–15.5)
WBC: 6.3 10*3/uL (ref 4.0–10.5)

## 2017-11-02 LAB — HEPARIN LEVEL (UNFRACTIONATED)
HEPARIN UNFRACTIONATED: 0.8 [IU]/mL — AB (ref 0.30–0.70)
Heparin Unfractionated: 0.51 IU/mL (ref 0.30–0.70)

## 2017-11-02 LAB — APTT
APTT: 89 s — AB (ref 24–36)
aPTT: 35 seconds (ref 24–36)

## 2017-11-02 LAB — LIPASE, BLOOD: Lipase: 1701 U/L — ABNORMAL HIGH (ref 11–51)

## 2017-11-02 MED ORDER — LOSARTAN POTASSIUM-HCTZ 100-25 MG PO TABS: 1.0000 | ORAL_TABLET | Freq: Every day | ORAL | Status: DC

## 2017-11-02 MED ORDER — BUPROPION HCL ER (SR) 150 MG PO TB12
150.0000 mg | ORAL_TABLET | Freq: Every day | ORAL | Status: DC
  Administered 2017-11-02 – 2017-11-08 (×7): 150 mg via ORAL
  Filled 2017-11-02 (×7): qty 1

## 2017-11-02 MED ORDER — LACTATED RINGERS IV SOLN
INTRAVENOUS | Status: DC
  Administered 2017-11-02 – 2017-11-03 (×3): via INTRAVENOUS

## 2017-11-02 MED ORDER — ALUM & MAG HYDROXIDE-SIMETH 200-200-20 MG/5ML PO SUSP
15.0000 mL | Freq: Once | ORAL | Status: AC
  Administered 2017-11-02: 15 mL via ORAL
  Filled 2017-11-02: qty 30

## 2017-11-02 MED ORDER — ONDANSETRON HCL 4 MG/2ML IJ SOLN
4.0000 mg | Freq: Once | INTRAMUSCULAR | Status: AC
  Administered 2017-11-02: 4 mg via INTRAVENOUS
  Filled 2017-11-02: qty 2

## 2017-11-02 MED ORDER — IOPAMIDOL (ISOVUE-300) INJECTION 61%
100.0000 mL | Freq: Once | INTRAVENOUS | Status: AC | PRN
  Administered 2017-11-02: 100 mL via INTRAVENOUS

## 2017-11-02 MED ORDER — SODIUM CHLORIDE 0.9 % IV SOLN
1.0000 g | Freq: Two times a day (BID) | INTRAVENOUS | Status: DC
  Administered 2017-11-02 – 2017-11-07 (×10): 1 g via INTRAVENOUS
  Filled 2017-11-02 (×11): qty 1

## 2017-11-02 MED ORDER — IOPAMIDOL (ISOVUE-300) INJECTION 61%
INTRAVENOUS | Status: AC
  Filled 2017-11-02: qty 100

## 2017-11-02 MED ORDER — ADULT MULTIVITAMIN W/MINERALS CH
1.0000 | ORAL_TABLET | Freq: Every day | ORAL | Status: DC
  Administered 2017-11-03 – 2017-11-08 (×5): 1 via ORAL
  Filled 2017-11-02 (×5): qty 1

## 2017-11-02 MED ORDER — MORPHINE SULFATE (PF) 2 MG/ML IV SOLN
2.0000 mg | Freq: Once | INTRAVENOUS | Status: AC
  Administered 2017-11-02: 2 mg via INTRAVENOUS
  Filled 2017-11-02: qty 1

## 2017-11-02 MED ORDER — ROSUVASTATIN CALCIUM 20 MG PO TABS
20.0000 mg | ORAL_TABLET | Freq: Every day | ORAL | Status: DC
  Filled 2017-11-02 (×2): qty 1

## 2017-11-02 MED ORDER — LOSARTAN POTASSIUM 50 MG PO TABS
100.0000 mg | ORAL_TABLET | Freq: Every day | ORAL | Status: DC
  Administered 2017-11-02 – 2017-11-08 (×7): 100 mg via ORAL
  Filled 2017-11-02 (×8): qty 2

## 2017-11-02 MED ORDER — POLYVINYL ALCOHOL 1.4 % OP SOLN
1.0000 [drp] | Freq: Three times a day (TID) | OPHTHALMIC | Status: DC
  Administered 2017-11-02 – 2017-11-08 (×12): 1 [drp] via OPHTHALMIC
  Filled 2017-11-02: qty 15

## 2017-11-02 MED ORDER — HEPARIN (PORCINE) IN NACL 100-0.45 UNIT/ML-% IJ SOLN
1200.0000 [IU]/h | INTRAMUSCULAR | Status: DC
  Administered 2017-11-02 – 2017-11-03 (×2): 1200 [IU]/h via INTRAVENOUS
  Filled 2017-11-02 (×2): qty 250

## 2017-11-02 MED ORDER — ONDANSETRON HCL 4 MG PO TABS: 4.0000 mg | ORAL_TABLET | Freq: Four times a day (QID) | ORAL | Status: DC | PRN

## 2017-11-02 MED ORDER — HYDROCHLOROTHIAZIDE 25 MG PO TABS
25.0000 mg | ORAL_TABLET | Freq: Every day | ORAL | Status: DC
  Administered 2017-11-02 – 2017-11-03 (×2): 25 mg via ORAL
  Filled 2017-11-02 (×2): qty 1

## 2017-11-02 MED ORDER — ACETAMINOPHEN 650 MG RE SUPP: 650.0000 mg | Freq: Four times a day (QID) | RECTAL | Status: DC | PRN

## 2017-11-02 MED ORDER — TRAMADOL HCL 50 MG PO TABS: 50.0000 mg | ORAL_TABLET | Freq: Four times a day (QID) | ORAL | Status: DC | PRN

## 2017-11-02 MED ORDER — LACTATED RINGERS IV BOLUS
1000.0000 mL | Freq: Once | INTRAVENOUS | Status: AC
  Administered 2017-11-02: 1000 mL via INTRAVENOUS

## 2017-11-02 MED ORDER — POLYVINYL ALCOHOL-POVIDONE PF 1.4-0.6 % OP SOLN: 1.0000 [drp] | Freq: Three times a day (TID) | OPHTHALMIC | Status: DC

## 2017-11-02 MED ORDER — POTASSIUM CHLORIDE IN NACL 20-0.9 MEQ/L-% IV SOLN: INTRAVENOUS | Status: DC

## 2017-11-02 MED ORDER — ASPIRIN EC 81 MG PO TBEC
81.0000 mg | DELAYED_RELEASE_TABLET | ORAL | Status: DC
  Administered 2017-11-04: 81 mg via ORAL
  Filled 2017-11-02: qty 1

## 2017-11-02 MED ORDER — ACETAMINOPHEN 325 MG PO TABS
650.0000 mg | ORAL_TABLET | Freq: Four times a day (QID) | ORAL | Status: DC | PRN
  Administered 2017-11-03 – 2017-11-07 (×6): 650 mg via ORAL
  Filled 2017-11-02 (×6): qty 2

## 2017-11-02 MED ORDER — CALCIUM CARBONATE-VITAMIN D 500-200 MG-UNIT PO TABS
1.0000 | ORAL_TABLET | Freq: Every day | ORAL | Status: DC
  Administered 2017-11-03 – 2017-11-08 (×5): 1 via ORAL
  Filled 2017-11-02 (×6): qty 1

## 2017-11-02 MED ORDER — SODIUM CHLORIDE 0.9 % IV SOLN
2.0000 g | Freq: Once | INTRAVENOUS | Status: AC
  Administered 2017-11-02: 2 g via INTRAVENOUS
  Filled 2017-11-02: qty 2

## 2017-11-02 MED ORDER — ONDANSETRON HCL 4 MG/2ML IJ SOLN: 4.0000 mg | Freq: Four times a day (QID) | INTRAMUSCULAR | Status: DC | PRN

## 2017-11-02 MED ORDER — MORPHINE SULFATE (PF) 2 MG/ML IV SOLN
2.0000 mg | INTRAVENOUS | Status: DC | PRN
  Administered 2017-11-02 (×2): 2 mg via INTRAVENOUS
  Filled 2017-11-02 (×2): qty 1

## 2017-11-02 MED ORDER — PANTOPRAZOLE SODIUM 40 MG PO TBEC
40.0000 mg | DELAYED_RELEASE_TABLET | Freq: Every day | ORAL | Status: DC
  Administered 2017-11-03 – 2017-11-08 (×6): 40 mg via ORAL
  Filled 2017-11-02 (×6): qty 1

## 2017-11-02 MED ORDER — METHOCARBAMOL 500 MG PO TABS
500.0000 mg | ORAL_TABLET | Freq: Every day | ORAL | Status: DC | PRN
  Administered 2017-11-03 – 2017-11-07 (×4): 500 mg via ORAL
  Filled 2017-11-02 (×5): qty 1

## 2017-11-02 MED ORDER — SODIUM CHLORIDE 0.9 % IV BOLUS
1000.0000 mL | Freq: Once | INTRAVENOUS | Status: AC
  Administered 2017-11-02: 1000 mL via INTRAVENOUS

## 2017-11-02 NOTE — ED Notes (Signed)
Patient transported to CT 

## 2017-11-02 NOTE — Progress Notes (Signed)
Spoke to Dr. Evangeline Gula, she advised to hold the bolus of L.R and then give the Pt the L.R at 125 and D/C the [place] + KCl.

## 2017-11-02 NOTE — ED Provider Notes (Signed)
[facility] EMERGENCY DEPARTMENT Provider Note   CSN: [id number] Arrival date & time: 11/02/17  2774     History   Chief Complaint Chief Complaint  Patient presents with  . Abdominal Pain    HPI Denise Cline is a 77 y.o. female.  77 yo F with a chief complaint of epigastric abdominal pain.  This been going on yesterday and then gotten progressively worse throughout the evening.  Nothing seems to make this better or worse.  Seems to come and go.  Described as crampy and sharp.  Denies radiation of the pain.  She has had some nausea and vomiting.  Constipated.  Denies urinary symptoms.  She also is complaining of neck pain.  This is worse at the base of her neck.  She is unable to describe what makes this better or worse.  She thinks maybe is related to her chronic left shoulder pain.  The history is provided by the patient.  Abdominal Pain   This is a new problem. The current episode started yesterday. The problem occurs constantly. The problem has been gradually worsening. The pain is associated with an unknown factor. The pain is located in the epigastric region. The quality of the pain is sharp, shooting and cramping. The pain is at a severity of 10/10. The pain is severe. Associated symptoms include nausea, vomiting and constipation. Pertinent negatives include fever, dysuria, headaches, arthralgias and myalgias. Nothing aggravates the symptoms. Nothing relieves the symptoms.    Past Medical History:  Diagnosis Date  . Anxiety   . COPD (chronic obstructive pulmonary disease) ([place])   . Fibroid   . History of shingles   . Hypertension   . Thyroid disease    Nodules  . Varicose veins     Patient Active Problem List   Diagnosis Date Noted  . DVT (deep venous thrombosis) ([place]) 11/12/2015  . Pulmonary emboli ([place]) 11/11/2015  . Acute pulmonary embolism ([place]) 11/11/2015  . Coronary artery calcification seen on CAT scan 05/12/2015  . 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 Dyspnea 03/17/2015  . Left leg swelling 03/16/2015  . Acute colitis 10/04/2014  . COPD (chronic obstructive pulmonary disease) ([place])   . PVD (peripheral vascular disease) ([place]) 04/01/2012  . Leg pain 04/01/2012  . Swelling of limb 04/01/2012  . History of shingles   . Hypertension   . Fibroid   . Thyroid disease   . Anxiety     Past Surgical History:  Procedure Laterality Date  . ABDOMINAL HYSTERECTOMY    . COLONOSCOPY N/A 10/05/2014   Procedure: COLONOSCOPY;  Surgeon: Ronald Lobo, MD;  Location: [facility] ENDOSCOPY;  Service: Endoscopy;  Laterality: N/A;  . OOPHORECTOMY     One ovary removed  . Thyroid nodules    . THYROID SURGERY       OB History    Gravida  3   Para  3   Term  3   Preterm      AB      Living  3     SAB      TAB      Ectopic      Multiple      Live Births               Home Medications    Prior to Admission medications   Medication Sig Start Date End Date Taking? Authorizing Provider  acetaminophen (TYLENOL) 650 MG CR tablet Take 650-1,300 mg by mouth 2 (two) times  daily as needed for pain.   Yes [provider]  aspirin EC 81 MG tablet Take 81 mg by mouth See admin instructions. Take one tablet by mouth only on Mon, Wed, Friday.   Yes [provider]  buPROPion (WELLBUTRIN SR) 150 MG 12 hr tablet Take 150 mg by mouth daily.    Yes [provider]  calcium-vitamin D (OSCAL WITH D) 250-125 MG-UNIT tablet Take 2 tablets by mouth daily.   Yes [provider]  losartan-hydrochlorothiazide (HYZAAR) 100-25 MG tablet Take 1 tablet by mouth daily.   Yes [provider]  methocarbamol (ROBAXIN) 500 MG tablet Take 500 mg by mouth daily as needed for muscle spasms.  07/08/17  Yes [provider]  Multiple Vitamins-Minerals (CENTRUM SILVER ADULT 50+) TABS Take 1 tablet by mouth daily.   Yes [provider]  OVER THE COUNTER MEDICATION Take 1 capsule by mouth at  bedtime. (ColonClenz)   Yes [provider]  pantoprazole (PROTONIX) 40 MG tablet Take 1 tablet (40 mg total) by mouth daily. 10/21/17  Yes Belva Crome, MD  Polyvinyl Alcohol-Povidone (REFRESH OP) Place 1 drop into both eyes 3 (three) times daily as needed (DRY EYES).    Yes [provider]  rivaroxaban (XARELTO) 20 MG TABS tablet Take 20 mg by mouth daily with supper.   Yes [provider]  rosuvastatin (CRESTOR) 20 MG tablet Take 1 tablet (20 mg total) by mouth daily. 10/25/17 10/25/18 Yes Belva Crome, MD  traMADol (ULTRAM) 50 MG tablet Take 1 tablet (50 mg total) by mouth every 6 (six) hours as needed. Patient taking differently: Take 50 mg by mouth every 6 (six) hours as needed for moderate pain.  10/09/17  Yes Fredia Sorrow, MD  triamcinolone (KENALOG) 0.025 % cream Apply 1 application topically daily as needed for itching. 03/29/17  Yes [provider]  traMADol (ULTRAM) 50 MG tablet Take 1 tablet (50 mg total) by mouth every 6 (six) hours as needed for moderate pain. Patient not taking: Reported on 11/02/2017 08/23/15   Mesner, Corene Cornea, MD    Family History Family History  Problem Relation Age of Onset  . Crohn's disease Mother   . Stroke Father   . Diabetes Sister   . Hypertension Sister   . Hypertension Brother     Social History Social History   Tobacco Use  . Smoking status: Former Smoker    Packs/day: 2.00    Years: 40.00    Pack years: 80.00    Types: Cigarettes    Last attempt to quit: 05/30/2008    Years since quitting: 9.4  . Smokeless tobacco: Former Systems developer    Quit date: 05/12/2008  . Tobacco comment: Counseled to remain smoke free  Substance Use Topics  . Alcohol use: No    Alcohol/week: 0.0 standard drinks    Comment: Rare  . Drug use: No     Allergies   Penicillins; Sulfa antibiotics; and Oxycodone   Review of Systems Review of Systems  Constitutional: Negative for chills and fever.  HENT: Negative for congestion  and rhinorrhea.   Eyes: Negative for redness and visual disturbance.  Respiratory: Negative for shortness of breath and wheezing.   Cardiovascular: Negative for chest pain and palpitations.  Gastrointestinal: Positive for abdominal pain, constipation, nausea and vomiting.  Genitourinary: Negative for dysuria and urgency.  Musculoskeletal: Positive for neck pain. Negative for arthralgias and myalgias.  Skin: Negative for pallor and wound.  Neurological: Negative for dizziness and headaches.  Physical Exam Updated Vital Signs BP (!) 119/97   Pulse (!) 59   Temp 97.8 F (36.6 C)   Resp 17   SpO2 99%   Physical Exam  Constitutional: She is oriented to person, place, and time. She appears well-developed and well-nourished. No distress.  HENT:  Head: Normocephalic and atraumatic.  Eyes: Pupils are equal, round, and reactive to light. EOM are normal.  Neck: Normal range of motion. Neck supple.  Cardiovascular: Normal rate and regular rhythm. Exam reveals no gallop and no friction rub.  No murmur heard. Pulmonary/Chest: Effort normal. She has no wheezes. She has no rales.  Abdominal: Soft. She exhibits distension. She exhibits no mass. There is tenderness (epigastric, negative murphys). There is no guarding.  Musculoskeletal: She exhibits no edema or tenderness.  Neurological: She is alert and oriented to person, place, and time.  Skin: Skin is warm and dry. She is not diaphoretic.  Psychiatric: She has a normal mood and affect. Her behavior is normal.  Nursing note and vitals reviewed.    ED Treatments / Results  Labs (all labs ordered are listed, but only abnormal results are displayed) Labs Reviewed  LIPASE, BLOOD - Abnormal; Notable for the following components:      Result Value   Lipase 1,701 (*)    All other components within normal limits  COMPREHENSIVE METABOLIC PANEL - Abnormal; Notable for the following components:   Potassium 3.4 (*)    Glucose, Bld 111 (*)     BUN 28 (*)    Creatinine, Ser 1.64 (*)    Calcium 8.8 (*)    Albumin 3.2 (*)    GFR calc non Af Amer 29 (*)    GFR calc Af Amer 33 (*)    All other components within normal limits  CBC - Abnormal; Notable for the following components:   RBC 3.74 (*)    MCV 100.3 (*)    All other components within normal limits  URINALYSIS, ROUTINE W REFLEX MICROSCOPIC - Abnormal; Notable for the following components:   APPearance HAZY (*)    Leukocytes, UA SMALL (*)    All other components within normal limits  I-STAT CHEM 8, ED - Abnormal; Notable for the following components:   BUN 35 (*)    Creatinine, Ser 1.70 (*)    Glucose, Bld 107 (*)    Calcium, Ion 1.11 (*)    All other components within normal limits  I-STAT TROPONIN, ED  I-STAT CG4 LACTIC ACID, ED  I-STAT CG4 LACTIC ACID, ED    EKG EKG Interpretation  Date/Time:  Saturday November 02 2017 08:44:56 EDT Ventricular Rate:  59 PR Interval:  146 QRS Duration: 78 QT Interval:  450 QTC Calculation: 445 R Axis:   26 Text Interpretation:  Sinus bradycardia Otherwise normal ECG No significant change since last tracing Confirmed by Deno Etienne [phone]) on 11/02/2017 9:31:33 AM   Radiology Ct Abdomen Pelvis W Contrast  Result Date: 11/02/2017 CLINICAL DATA:  Abdominal pain and distention for the past 2 days. EXAM: CT ABDOMEN AND PELVIS WITH CONTRAST TECHNIQUE: Multidetector CT imaging of the abdomen and pelvis was performed using the standard protocol following bolus administration of intravenous contrast. CONTRAST:  136mL ISOVUE-300 IOPAMIDOL (ISOVUE-300) INJECTION 61% COMPARISON:  CT abdomen pelvis dated October 08, 2017. FINDINGS: Lower chest: No acute abnormality. Hepatobiliary: No focal liver abnormality. Unchanged 7 mm small cyst versus hemangioma in the left hepatic lobe. New gallbladder distention with diffuse wall thickening. New mild common bile duct and intrahepatic  biliary dilatation. Pancreas: New mild dilatation of the proximal main  pancreatic duct with mild peripancreatic inflammatory changes. The pancreas enhances normally. No fluid collection. Spleen: Normal in size without focal abnormality. Adrenals/Urinary Tract: Adrenal glands are unremarkable. Kidneys are normal, without renal calculi, focal lesion, or hydronephrosis. Stable bilateral renal cysts. Bladder is unremarkable. Stomach/Bowel: Small hiatal hernia. Stomach is otherwise within normal limits. Appendix appears normal. No evidence of bowel wall thickening, distention, or inflammatory changes. Vascular/Lymphatic: Mild aortic atherosclerosis. No enlarged abdominal or pelvic lymph nodes. Reproductive: Status post hysterectomy. No adnexal masses. Other: No free fluid or pneumoperitoneum. Unchanged tiny fat containing umbilical hernia. Musculoskeletal: No acute or significant osseous findings. IMPRESSION: 1. New biliary dilatation and main pancreatic duct dilatation with peripancreatic inflammatory changes. Findings are consistent with obstruction at the ampulla with pancreatitis, favored secondary to choledocholithiasis, although no discrete radiopaque gallstone is identified. Correlation with ERCP is recommended. 2. New gallbladder distention with prominent wall thickening, concerning for acute cholecystitis. 3.  Aortic atherosclerosis (ICD10-I70.0). Electronically Signed   By: Titus Dubin M.D.   On: 11/02/2017 10:14    Procedures Procedures (including critical care time)  Medications Ordered in ED Medications  iopamidol (ISOVUE-300) 61 % injection (has no administration in time range)  ceFEPIme (MAXIPIME) 2 g in sodium chloride 0.9 % 100 mL IVPB (has no administration in time range)  sodium chloride 0.9 % bolus 1,000 mL (0 mLs Intravenous Stopped 11/02/17 0902)  morphine 2 MG/ML injection 2 mg (2 mg Intravenous Given 11/02/17 0801)  ondansetron (ZOFRAN) injection 4 mg (4 mg Intravenous Given 11/02/17 0801)  alum & mag hydroxide-simeth (MAALOX/MYLANTA) 200-200-20 MG/5ML  suspension 15 mL (15 mLs Oral Given 11/02/17 0802)  iopamidol (ISOVUE-300) 61 % injection 100 mL (100 mLs Intravenous Contrast Given 11/02/17 0932)     Initial Impression / Assessment and Plan / ED Course  I have reviewed the triage vital signs and the nursing notes.  Pertinent labs & imaging results that were available during my care of the patient were reviewed by me and considered in my medical decision making (see chart for details).     77 yo F with a chief complaint of abdominal pain.  Going on for the past day.  Epigastric and crampy.  She is also describing constipation and nausea.  Has a history of a hysterectomy in the 40s.  She is also describing some abdominal distention.  Will obtain a CT scan to further evaluate.  CT scan is concerning for common bile duct dilatation as well as pancreatic duct dilatation.  The gallbladder is also enlarged with a thickened wall.  I gave the patient Zosyn even though she is well-appearing and nontoxic.  I discussed the case with Dr. Acie Fredrickson gastroenterology she recommended an MRCP and discussion with general surgery.  I discussed the case with Dr. Georgette Dover, he felt that there is no urgent need for surgical evaluation at this time.  Recommended medical admission.  The patients results and plan were reviewed and discussed.   Any x-rays performed were independently reviewed by myself.   Differential diagnosis were considered with the presenting HPI.  Medications  iopamidol (ISOVUE-300) 61 % injection (has no administration in time range)  ceFEPIme (MAXIPIME) 2 g in sodium chloride 0.9 % 100 mL IVPB (has no administration in time range)  sodium chloride 0.9 % bolus 1,000 mL (0 mLs Intravenous Stopped 11/02/17 0902)  morphine 2 MG/ML injection 2 mg (2 mg Intravenous Given 11/02/17 0801)  ondansetron (ZOFRAN) injection 4 mg (4  mg Intravenous Given 11/02/17 0801)  alum & mag hydroxide-simeth (MAALOX/MYLANTA) 200-200-20 MG/5ML suspension 15 mL (15 mLs  Oral Given 11/02/17 0802)  iopamidol (ISOVUE-300) 61 % injection 100 mL (100 mLs Intravenous Contrast Given 11/02/17 0932)    Vitals:   11/02/17 0745 11/02/17 0815 11/02/17 0900 11/02/17 0915  BP: 132/83 124/76 139/83 (!) 119/97  Pulse:  (!) 59    Resp:   12 17  Temp:      TempSrc:      SpO2: 98% 99%      Final diagnoses:  Acute biliary pancreatitis without infection or necrosis    Admission/ observation were discussed with the admitting physician, patient and/or family and they are comfortable with the plan.    Final Clinical Impressions(s) / ED Diagnoses   Final diagnoses:  Acute biliary pancreatitis without infection or necrosis    ED Discharge Orders    None       Deno Etienne, DO 11/02/17 1252

## 2017-11-02 NOTE — Progress Notes (Signed)
ANTICOAGULATION CONSULT NOTE - Follow Up Consult  Pharmacy Consult for Xarelto to Heparin  Indication: pulmonary embolus history  Allergies  Allergen Reactions  . Penicillins Anaphylaxis    Has patient had a PCN reaction causing immediate rash, facial/tongue/throat swelling, SOB or lightheadedness with hypotension: yes Has patient had a PCN reaction causing severe rash involving mucus membranes or skin necrosis: no Has patient had a PCN reaction that required hospitalization yes Has patient had a PCN reaction occurring within the last 10 years: no If all of the above answers are "NO", then may proceed with Cephalosporin use.   . Sulfa Antibiotics Hives  . Oxycodone Other (See Comments)    hallucanations     Patient Measurements: Height: 5\' 6"  (167.6 cm) Weight: 199 lb 11.8 oz (90.6 kg) IBW/kg (Calculated) : 59.3 Heparin Dosing Weight: 81 kg  Vital Signs: Temp: 98 F (36.7 C) (08/31 1413) Temp Source: Oral (08/31 1413) BP: 129/91 (08/31 1413) Pulse Rate: 52 (08/31 1413)  Labs: Recent Labs    11/02/17 0748 11/02/17 0802  HGB 12.1 12.2  HCT 37.5 36.0  PLT 249  --   CREATININE 1.64* 1.70*    Estimated Creatinine Clearance: 30.4 mL/min (A) (by C-G formula based on SCr of 1.7 mg/dL (H)).   Assessment: 77 year old female to begin heparin while Xarelto on hold for PE history.  Admitted with abdominal pain / ? Need for cholecystectomy.  Last dose of Xarelto 8/29  Will use PTT for monitoring until heparin level and PTT coorelate  Goal of Therapy:  Heparin level 0.3-0.7 units/ml Monitor platelets by anticoagulation protocol: Yes  PTT = 66 to 102 seconds   Plan:  Baseline labs ordered Heparin at 1200 units / hr (no bolus for now) 8 hour heparin level / PTT Daily heparin level, PTT, CBC  Thank you Anette Guarneri, PharmD [phone]  11/02/2017,2:44 PM

## 2017-11-02 NOTE — Consult Note (Signed)
Denise Cline  Referring Provider: Deno Etienne, DO Primary Care Physician:  Josetta Huddle, MD Primary Gastroenterologist: Dr.Buccini  Reason for Consultation:  Abdominal pain, pancreatitis  HPI: AHLAYAH Cline is a 77 y.o. female drove herself to the ER due to excruciating epigastric abdominal pain. Patient states she was in a usual state of health until 3 weeks ago and she developed pain over her right shoulder and between her shoulder blades, in the chest and upper abdomen and on CAT scan from 10/08/17 was found to have a 4.5 cm ascending thoracic aortic aneurysm.She was evaluated by her primary care physician and cardiologist. The pain did not subside, improved mildly with pain medications, worsened yesterday and was associated with several episodes of nausea and vomiting. Patient denies heartburn, acid reflux, difficulty swallowing or pain on swallowing. She denies unintentional weight loss or loss of appetite. Patient states she has not had alcohol since 2003 and quit smoking in 2010. She denies use of any new medications except PPI. She normally takes a laxative and reports having a bowel movement daily, denies recent blood in stool or black stools. 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    Past Medical History:  Diagnosis Date  . Anxiety   . COPD (chronic obstructive pulmonary disease) ([place])   . Fibroid   . History of shingles   . Hypertension   . Thyroid disease    Nodules  . Varicose veins     Past Surgical History:  Procedure Laterality Date  . ABDOMINAL HYSTERECTOMY    . COLONOSCOPY N/A 10/05/2014   Procedure: COLONOSCOPY;  Surgeon: Ronald Lobo, MD;  Location: [facility];  Service: Endoscopy;  Laterality: N/A;  . OOPHORECTOMY     One ovary removed  . Thyroid nodules    . THYROID SURGERY      Prior to Admission medications   Medication Sig Start Date End Date Taking? Authorizing Provider   acetaminophen (TYLENOL) 650 MG CR tablet Take 650-1,300 mg by mouth 2 (two) times daily as needed for pain.   Yes [provider]  aspirin EC 81 MG tablet Take 81 mg by mouth See admin instructions. Take one tablet by mouth only on Mon, Wed, Friday.   Yes [provider]  buPROPion (WELLBUTRIN SR) 150 MG 12 hr tablet Take 150 mg by mouth daily.    Yes [provider]  calcium-vitamin D (OSCAL WITH D) 250-125 MG-UNIT tablet Take 2 tablets by mouth daily.   Yes [provider]  losartan-hydrochlorothiazide (HYZAAR) 100-25 MG tablet Take 1 tablet by mouth daily.   Yes [provider]  methocarbamol (ROBAXIN) 500 MG tablet Take 500 mg by mouth daily as needed for muscle spasms.  07/08/17  Yes [provider]  Multiple Vitamins-Minerals (CENTRUM SILVER ADULT 50+) TABS Take 1 tablet by mouth daily.   Yes [provider]  OVER THE COUNTER MEDICATION Take 1 capsule by mouth at bedtime. (ColonClenz)   Yes [provider]  pantoprazole (PROTONIX) 40 MG tablet Take 1 tablet (40 mg total) by mouth daily. 10/21/17  Yes Belva Crome, MD  Polyvinyl Alcohol-Povidone (REFRESH OP) Place 1 drop into both eyes 3 (three) times daily as needed (DRY EYES).    Yes [provider]  rivaroxaban (XARELTO) 20 MG TABS tablet Take 20 mg by mouth daily with supper.   Yes [provider]  rosuvastatin (CRESTOR) 20 MG tablet Take 1 tablet (20 mg total) by mouth daily.  10/25/17 10/25/18 Yes Belva Crome, MD  traMADol (ULTRAM) 50 MG tablet Take 1 tablet (50 mg total) by mouth every 6 (six) hours as needed. Patient taking differently: Take 50 mg by mouth every 6 (six) hours as needed for moderate pain.  10/09/17  Yes Fredia Sorrow, MD  triamcinolone (KENALOG) 0.025 % cream Apply 1 application topically daily as needed for itching. 03/29/17  Yes [provider]  traMADol (ULTRAM) 50 MG tablet Take 1 tablet (50 mg total) by mouth every 6  (six) hours as needed for moderate pain. Patient not taking: Reported on 11/02/2017 08/23/15   Mesner, Corene Cornea, MD    Current Facility-Administered Medications  Medication Dose Route Frequency Provider Last Rate Last Dose  . iopamidol (ISOVUE-300) 61 % injection           . morphine 2 MG/ML injection 2 mg  2 mg Intravenous Q2H PRN Lady Deutscher, MD   2 mg at 11/02/17 1339    Allergies as of 11/02/2017 - Review Complete 11/02/2017  Allergen Reaction Noted  . Penicillins Anaphylaxis 02/18/2012  . Sulfa antibiotics Hives 07/03/2013  . Oxycodone Other (See Comments) 04/01/2012    Family History  Problem Relation Age of Onset  . Crohn's disease Mother   . Stroke Father   . Diabetes Sister   . Hypertension Sister   . Hypertension Brother     Social History   Socioeconomic History  . Marital status: Single    Spouse name: Not on file  . Number of children: Not on file  . Years of education: Not on file  . Highest education level: Not on file  Occupational History  . Not on file  Social Needs  . Financial resource strain: Not on file  . Food insecurity:    Worry: Not on file    Inability: Not on file  . Transportation needs:    Medical: Not on file    Non-medical: Not on file  Tobacco Use  . Smoking status: Former Smoker    Packs/day: 2.00    Years: 40.00    Pack years: 80.00    Types: Cigarettes    Last attempt to quit: 05/30/2008    Years since quitting: 9.4  . Smokeless tobacco: Former Systems developer    Quit date: 05/12/2008  . Tobacco comment: Counseled to remain smoke free  Substance and Sexual Activity  . Alcohol use: No    Alcohol/week: 0.0 standard drinks    Comment: Rare  . Drug use: No  . Sexual activity: Never    Birth control/protection: Surgical  Lifestyle  . Physical activity:    Days per week: Not on file    Minutes per session: Not on file  . Stress: Not on file  Relationships  . Social connections:    Talks on phone: Not on file    Gets together:  Not on file    Attends religious service: Not on file    Active member of club or organization: Not on file    Attends meetings of clubs or organizations: Not on file    Relationship status: Not on file  . Intimate partner violence:    Fear of current or ex partner: Not on file    Emotionally abused: Not on file    Physically abused: Not on file    Forced sexual activity: Not on file  Other Topics Concern  . Not on file  Social History Narrative  . Not on file    Review of  Systems: Positive for: GI: Described in detail in HPI.    Gen: Denies any fever, chills, rigors, night sweats, anorexia, fatigue, weakness, malaise, involuntary weight loss, and sleep disorder CV: chest pain, Denies angina, palpitations, syncope, orthopnea, PND, peripheral edema, and claudication. Resp: Denies dyspnea, cough, sputum, wheezing, coughing up blood. GU : Denies urinary burning, blood in urine, urinary frequency, urinary hesitancy, nocturnal urination, and urinary incontinence. MS: Denies joint pain or swelling.  Denies muscle weakness, cramps, atrophy.  Derm: Denies rash, itching, oral ulcerations, hives, unhealing ulcers.  Psych: Denies depression, anxiety, memory loss, suicidal ideation, hallucinations,  and confusion. Heme: Denies bruising, bleeding, and enlarged lymph nodes. Neuro:  Denies any headaches, dizziness, paresthesias. Endo:  Denies any problems with DM, thyroid, adrenal function.  Physical Exam: Vital signs in last 24 hours: Temp:  [97.8 F (36.6 C)] 97.8 F (36.6 C) (08/31 0730) Pulse Rate:  [55-76] 59 (08/31 0815) Resp:  [12-18] 17 (08/31 0915) BP: (119-139)/(75-117) 119/97 (08/31 0915) SpO2:  [98 %-100 %] 99 % (08/31 0815)    General:   Alert,  Well-developed, overweight, pleasant and cooperative in NAD Head:  Normocephalic and atraumatic. Eyes:  Sclera clear, no icterus.   Conjunctiva pink. Ears:  Normal auditory acuity. Nose:  No deformity, discharge,  or lesions. Mouth:   No deformity or lesions.  Oropharynx pink & moist. Neck:  Supple; no masses or thyromegaly. Lungs:  Clear throughout to auscultation.   No wheezes, crackles, or rhonchi. No acute distress. Heart:  Regular rate and rhythm; no murmurs, clicks, rubs,  or gallops. Extremities:  Without clubbing or edema. Neurologic:  Alert and  oriented x4;  grossly normal neurologically. Skin:  Intact without significant lesions or rashes. Psych:  Alert and cooperative. Normal mood and affect. Abdomen:  Soft, upper abdomen and epigastric tenderness and nondistended. No masses, hepatosplenomegaly or hernias noted. Normal bowel sounds, without guarding, and without rebound.         Lab Results: Recent Labs    11/02/17 0748 11/02/17 0802  WBC 6.3  --   HGB 12.1 12.2  HCT 37.5 36.0  PLT 249  --    BMET Recent Labs    11/02/17 0748 11/02/17 0802  NA 140 140  K 3.4* 3.5  CL 103 100  CO2 27  --   GLUCOSE 111* 107*  BUN 28* 35*  CREATININE 1.64* 1.70*  CALCIUM 8.8*  --    LFT Recent Labs    11/02/17 0748  PROT 6.6  ALBUMIN 3.2*  AST 30  ALT 35  ALKPHOS 58  BILITOT 0.8   PT/INR No results for input(s): LABPROT, INR in the last 72 hours.  Studies/Results: Ct Abdomen Pelvis W Contrast  Result Date: 11/02/2017 CLINICAL DATA:  Abdominal pain and distention for the past 2 days. EXAM: CT ABDOMEN AND PELVIS WITH CONTRAST TECHNIQUE: Multidetector CT imaging of the abdomen and pelvis was performed using the standard protocol following bolus administration of intravenous contrast. CONTRAST:  143mL ISOVUE-300 IOPAMIDOL (ISOVUE-300) INJECTION 61% COMPARISON:  CT abdomen pelvis dated October 08, 2017. FINDINGS: Lower chest: No acute abnormality. Hepatobiliary: No focal liver abnormality. Unchanged 7 mm small cyst versus hemangioma in the left hepatic lobe. New gallbladder distention with diffuse wall thickening. New mild common bile duct and intrahepatic biliary dilatation. Pancreas: New mild dilatation  of the proximal main pancreatic duct with mild peripancreatic inflammatory changes. The pancreas enhances normally. No fluid collection. Spleen: Normal in size without focal abnormality. Adrenals/Urinary Tract: Adrenal glands are unremarkable. Kidneys are  normal, without renal calculi, focal lesion, or hydronephrosis. Stable bilateral renal cysts. Bladder is unremarkable. Stomach/Bowel: Small hiatal hernia. Stomach is otherwise within normal limits. Appendix appears normal. No evidence of bowel wall thickening, distention, or inflammatory changes. Vascular/Lymphatic: Mild aortic atherosclerosis. No enlarged abdominal or pelvic lymph nodes. Reproductive: Status post hysterectomy. No adnexal masses. Other: No free fluid or pneumoperitoneum. Unchanged tiny fat containing umbilical hernia. Musculoskeletal: No acute or significant osseous findings. IMPRESSION: 1. New biliary dilatation and main pancreatic duct dilatation with peripancreatic inflammatory changes. Findings are consistent with obstruction at the ampulla with pancreatitis, favored secondary to choledocholithiasis, although no discrete radiopaque gallstone is identified. Correlation with ERCP is recommended. 2. New gallbladder distention with prominent wall thickening, concerning for acute cholecystitis. 3.  Aortic atherosclerosis (ICD10-I70.0). Electronically Signed   By: Titus Dubin M.D.   On: 11/02/2017 10:14    Impression: 1.Lipase of 1701, epigastric/upper abdominal pain, peripancreatic inflammation noted on CAT scan consistent with acute pancreatitis-likely related to gallstones. 2. No gallbladder distention with diffuse wall thickening 3. No mild common bile duct and intrahepatic biliary dilatation, possible choledocholithiasis versus ampullary swelling from pancreatitis 4.renal impairment, BUN 35, creatinine 1.7, GFR  33  Plan: 1.lactated Ringer's at 125 mL per hour. 2. Nothing by mouth for now 3. MRI and MRCP 4. On morphine 2 mg IV  every 2 hours when necessary for moderate pain Depending upon MRCP may need ERCP if choledocholithiasis is noted. Recommend surgical evaluation for cholecystectomy. Diet to be advanced depending upon clinical picture and whether or not patient needs procedures.   LOS: 0 days   Ronnette Juniper, MD  11/02/2017, 2:10 PM  [phone]

## 2017-11-02 NOTE — H&P (Addendum)
History and Physical    Denise Cline XNT:[id number] DOB: [birth year] DOA: 11/02/2017  PCP: Josetta Huddle, MD   Patient coming from: Home via EMS  I have personally briefly reviewed patient's old medical records in [place]  Chief Complaint: Abdominal swelling nausea and vomiting  HPI: Denise Cline is a 77 y.o. female with medical history significant of peripheral vascular disease, thyroid nodules, mild COPD, thoracic aortic aneurysm disease (4.5 cm noted on CT scan in 2016 with CT scan in August 2019 showing no change), recent episodes of back pain shoulder pain and abdominal pain who presents the emergency department complaining of abdominal swelling and nausea and epigastric abdominal pain.  Abdominal pain began yesterday and gotten progressively worse throughout the evening.  Nothing seems to make this better or worse.  The pain is intermittent.  It is crampy in nature.  Occasionally it is sharp pain does not radiate.  The patient does complain of additional pain in her neck and right shoulder.  She has had some nausea and vomiting associated with the discomfort in her abdomen.  She complains of constipation.  She has no urinary symptoms.  In makes it better or worse.  Domino and shoulder pain has been present for several months and comes intermittently.  He has not previously sought medical attention for it.  She did see emergency department for pain in her back in early August and the repeat angiogram was obtained which was unchanged from prior. Domino pain is associated with nausea vomiting and constipation nothing makes it better, nothing makes it worse, she denies any fever, dysuria, headaches, arthralgias, myalgias, diarrhea, pulse, or blurry vision.  ED Course: CT scan showed common bile bile duct and pancreatic duct dilatation with enlarged gallbladder and thickened wall.  Dr. Therisa Doyne from [place] and Dr. Georgette Dover from general surgery were consulted.  She received a dose of  cefepime  I recommended MRCP which has been ordered.  He is referred to [facility] for admission.  Review of Systems: As per HPI otherwise all other systems reviewed and  negative.    Past Medical History:  Diagnosis Date  . Anxiety   . COPD (chronic obstructive pulmonary disease) ([place])   . Fibroid   . History of shingles   . Hypertension   . Thyroid disease    Nodules  . Varicose veins     Past Surgical History:  Procedure Laterality Date  . ABDOMINAL HYSTERECTOMY    . COLONOSCOPY N/A 10/05/2014   Procedure: COLONOSCOPY;  Surgeon: Ronald Lobo, MD;  Location: [facility] END[facility]COPY;  Service: Endoscopy;  Laterality: N/A;  . OOPHORECTOMY     One ovary removed  . Thyroid nodules    . THYROID SURGERY      Social History   Social History Narrative  . Not on file     reports that she quit smoking about 9 years ago. Her smoking use included cigarettes. She has a 80.00 pack-year smoking history. She quit smokeless tobacco use about 9 years ago. She reports that she does not drink alcohol or use drugs.  Allergies  Allergen Reactions  . Penicillins Anaphylaxis    Has patient had a PCN reaction causing immediate rash, facial/tongue/throat swelling, SOB or lightheadedness with hypotension: yes Has patient had a PCN reaction causing severe rash involving mucus membranes or skin necrosis: no Has patient had a PCN reaction that required hospitalization yes Has patient had a PCN reaction occurring within the last 10 years: no If all of the  above answers are "NO", then may proceed with Cephalosporin use.   . Sulfa Antibiotics Hives  . Oxycodone Other (See Comments)    hallucanations     Family History  Problem Relation Age of Onset  . Crohn's disease Mother   . Stroke Father   . Diabetes Sister   . Hypertension Sister   . Hypertension Brother     Prior to Admission medications   Medication Sig Start Date End Date Taking? Authorizing Provider  acetaminophen (TYLENOL) 650 MG CR tablet  Take 650-1,300 mg by mouth 2 (two) times daily as needed for pain.   Yes [provider]  aspirin EC 81 MG tablet Take 81 mg by mouth See admin instructions. Take one tablet by mouth only on Mon, Wed, Friday.   Yes [provider]  buPROPion (WELLBUTRIN SR) 150 MG 12 hr tablet Take 150 mg by mouth daily.    Yes [provider]  calcium-vitamin D (OSCAL WITH D) 250-125 MG-UNIT tablet Take 2 tablets by mouth daily.   Yes [provider]  losartan-hydrochlorothiazide (HYZAAR) 100-25 MG tablet Take 1 tablet by mouth daily.   Yes [provider]  methocarbamol (ROBAXIN) 500 MG tablet Take 500 mg by mouth daily as needed for muscle spasms.  07/08/17  Yes [provider]  Multiple Vitamins-Minerals (CENTRUM SILVER ADULT 50+) TABS Take 1 tablet by mouth daily.   Yes [provider]  OVER THE COUNTER MEDICATION Take 1 capsule by mouth at bedtime. (ColonClenz)   Yes [provider]  pantoprazole (PROTONIX) 40 MG tablet Take 1 tablet (40 mg total) by mouth daily. 10/21/17  Yes Belva Crome, MD  Polyvinyl Alcohol-Povidone (REFRESH OP) Place 1 drop into both eyes 3 (three) times daily as needed (DRY EYES).    Yes [provider]  rivaroxaban (XARELTO) 20 MG TABS tablet Take 20 mg by mouth daily with supper.   Yes [provider]  rosuvastatin (CRESTOR) 20 MG tablet Take 1 tablet (20 mg total) by mouth daily. 10/25/17 10/25/18 Yes Belva Crome, MD  traMADol (ULTRAM) 50 MG tablet Take 1 tablet (50 mg total) by mouth every 6 (six) hours as needed. Patient taking differently: Take 50 mg by mouth every 6 (six) hours as needed for moderate pain.  10/09/17  Yes Fredia Sorrow, MD  triamcinolone (KENALOG) 0.025 % cream Apply 1 application topically daily as needed for itching. 03/29/17  Yes [provider]  traMADol (ULTRAM) 50 MG tablet Take 1 tablet (50 mg total) by mouth every 6 (six) hours as needed for moderate  pain. Patient not taking: Reported on 11/02/2017 08/23/15   Mesner, Corene Cornea, MD    Physical Exam:  Constitutional: NAD, calm, comfortable Vitals:   11/02/17 0815 11/02/17 0900 11/02/17 0915 11/02/17 1413  BP: 124/76 139/83 (!) 119/97 (!) 129/91  Pulse: (!) 59   (!) 52  Resp:  12 17   Temp:    98 F (36.7 C)  TempSrc:    Oral  SpO2: 99%     Weight:    90.6 kg  Height:    5\' 6"  (1.676 m)   Eyes: PERRL, lids and conjunctivae normal ENMT: Mucous membranes are moist. Posterior pharynx clear of any exudate or lesions.Normal dentition.  Neck: normal, supple, no masses, no thyromegaly Respiratory: clear to auscultation bilaterally, no wheezing, no crackles. Normal respiratory effort. No accessory muscle use.  Cardiovascular: Regular rate and rhythm, no murmurs / rubs / gallops. No extremity edema. 2+ pedal pulses. No  carotid bruits.  Abdomen: Epigastric tenderness, with mild distention, no masses palpated. No hepatosplenomegaly. Bowel sounds positive.  Musculoskeletal: no clubbing / cyanosis. No joint deformity upper and lower extremities. Good ROM, no contractures. Normal muscle tone.  Skin: no rashes, lesions, ulcers. No induration Neurologic: CN 2-12 grossly intact. Sensation intact, DTR normal. Strength 5/5 in all 4.  Psychiatric: Normal judgment and insight. Alert and oriented x 3. Normal mood.    Labs on Admission: I have personally reviewed following labs and imaging studies  CBC: Recent Labs  Lab 11/02/17 0748 11/02/17 0802  WBC 6.3  --   HGB 12.1 12.2  HCT 37.5 36.0  MCV 100.3*  --   PLT 249  --    Basic Metabolic Panel: Recent Labs  Lab 11/02/17 0748 11/02/17 0802  NA 140 140  K 3.4* 3.5  CL 103 100  CO2 27  --   GLUCOSE 111* 107*  BUN 28* 35*  CREATININE 1.64* 1.70*  CALCIUM 8.8*  --    GFR: Estimated Creatinine Clearance: 30.4 mL/min (A) (by C-G formula based on SCr of 1.7 mg/dL (H)). Liver Function Tests: Recent Labs  Lab 11/02/17 0748  AST 30  ALT 35   ALKPHOS 58  BILITOT 0.8  PROT 6.6  ALBUMIN 3.2*   Recent Labs  Lab 11/02/17 0748  LIPASE 1,701*   Urine analysis:    Component Value Date/Time   COLORURINE YELLOW 11/02/2017 0854   APPEARANCEUR HAZY (A) 11/02/2017 0854   LABSPEC 1.011 11/02/2017 0854   PHURINE 5.0 11/02/2017 0854   GLUCOSEU NEGATIVE 11/02/2017 0854   HGBUR NEGATIVE 11/02/2017 0854   BILIRUBINUR NEGATIVE 11/02/2017 0854   KETONESUR NEGATIVE 11/02/2017 0854   PROTEINUR NEGATIVE 11/02/2017 0854   UROBILINOGEN 0.2 10/04/2014 1415   NITRITE NEGATIVE 11/02/2017 0854   LEUKOCYTESUR SMALL (A) 11/02/2017 0854    Radiological Exams on Admission: Ct Abdomen Pelvis W Contrast  Result Date: 11/02/2017 CLINICAL DATA:  Abdominal pain and distention for the past 2 days. EXAM: CT ABDOMEN AND PELVIS WITH CONTRAST TECHNIQUE: Multidetector CT imaging of the abdomen and pelvis was performed using the standard protocol following bolus administration of intravenous contrast. CONTRAST:  173mL ISOVUE-300 IOPAMIDOL (ISOVUE-300) INJECTION 61% COMPARISON:  CT abdomen pelvis dated October 08, 2017. FINDINGS: Lower chest: No acute abnormality. Hepatobiliary: No focal liver abnormality. Unchanged 7 mm small cyst versus hemangioma in the left hepatic lobe. New gallbladder distention with diffuse wall thickening. New mild common bile duct and intrahepatic biliary dilatation. Pancreas: New mild dilatation of the proximal main pancreatic duct with mild peripancreatic inflammatory changes. The pancreas enhances normally. No fluid collection. Spleen: Normal in size without focal abnormality. Adrenals/Urinary Tract: Adrenal glands are unremarkable. Kidneys are normal, without renal calculi, focal lesion, or hydronephrosis. Stable bilateral renal cysts. Bladder is unremarkable. Stomach/Bowel: Small hiatal hernia. Stomach is otherwise within normal limits. Appendix appears normal. No evidence of bowel wall thickening, distention, or inflammatory changes.  Vascular/Lymphatic: Mild aortic atherosclerosis. No enlarged abdominal or pelvic lymph nodes. Reproductive: Status post hysterectomy. No adnexal masses. Other: No free fluid or pneumoperitoneum. Unchanged tiny fat containing umbilical hernia. Musculoskeletal: No acute or significant osseous findings. IMPRESSION: 1. New biliary dilatation and main pancreatic duct dilatation with peripancreatic inflammatory changes. Findings are consistent with obstruction at the ampulla with pancreatitis, favored secondary to choledocholithiasis, although no discrete radiopaque gallstone is identified. Correlation with ERCP is recommended. 2. New gallbladder distention with prominent wall thickening, concerning for acute cholecystitis. 3.  Aortic atherosclerosis (ICD10-I70.0). Electronically Signed  By: Titus Dubin M.D.   On: 11/02/2017 10:14    EKG: Independently reviewed.  The cardia unchanged from prior Assessment/Plan Principal Problem:   Choledocholithiasis with acute cholecystitis with obstruction Active Problems:   PVD (peripheral vascular disease) ([facility])   Thoracic aortic aneurysm ([facility])   Chronic kidney disease, stage 3 ([facility])   Hypertension   Thyroid disease   COPD (chronic obstructive pulmonary disease) ([place])   1.  Choledocholithiasis with acute cholecystitis with obstruction: Patient will be admitted into the hospital she is going to receive IV fluids and IV pain medication.  She will be kept n.p.o. except for medications.  GI has recommended patient receive an MRCP.  Likely need to proceed to an ERCP should MRCP show choledocholithiasis.  Starting her on cefepime for possible infected gallbladder given current situation.  Repeat lipase in a.m.  2.  Mild gallstone pancreatitis: IV fluids as above patient will require definitive treatment of gallbladder disease.  3.  Peripheral vascular disease: Noted patient also with thoracic aortic aneurysm as below.  4.  Thoracic aortic aneurysm: 4.5 cm and  unchanged since 2016: Patient will continue with planned CT scan which is scheduled for 6 months from October 08, 2017.  5.  Chronic kidney disease stage III: Creatinine is at 1.7 and about her baseline.  She will receive some hydration and it may improve from there.  6.  Hypertension: Continue home blood pressure medications.  7.  Thyroid nodules: Noted check a TSH.  8.  COPD: It with a long history of asthma which she grew out of she does occasionally use an inhaler although she does not recognize a diagnosis of COPD.  9.  History of pulmonary embolism: Patient is on Xarelto on a daily basis we will start a heparin drip  DVT prophylaxis: Heparin drip Code Status: Full code Family Communication: Patient retains capacity and did not wish for me to call her son. Disposition Plan: *Likely home once issues resolved in 7 days Consults called: GI Dr. Therisa Doyne, Dr. Georgette Dover from general surgery Admission status: Inpatient   Lady Deutscher MD [facility] Pager [phone]  If 7PM-7AM, please contact night-coverage [email or web address] Password [email or web address]  11/02/2017, 3:23 PM

## 2017-11-02 NOTE — ED Triage Notes (Signed)
Pt has had worsening abdominal distention, nausea, and vomiting. Pt has been seen recently for same and followed up with PCP and cardiology. Was told she has some sort of "aneurysm"  Pain down right shoulder around back to belly. Pt also endorses constipation and has had trouble urinating as well.

## 2017-11-03 ENCOUNTER — Inpatient Hospital Stay (HOSPITAL_COMMUNITY): Payer: Medicare HMO

## 2017-11-03 ENCOUNTER — Encounter (HOSPITAL_COMMUNITY): Payer: Self-pay | Admitting: Radiology

## 2017-11-03 DIAGNOSIS — I712 Thoracic aortic aneurysm, without rupture: Secondary | ICD-10-CM

## 2017-11-03 DIAGNOSIS — E079 Disorder of thyroid, unspecified: Secondary | ICD-10-CM

## 2017-11-03 DIAGNOSIS — K8043 Calculus of bile duct with acute cholecystitis with obstruction: Secondary | ICD-10-CM

## 2017-11-03 DIAGNOSIS — Z01810 Encounter for preprocedural cardiovascular examination: Secondary | ICD-10-CM

## 2017-11-03 DIAGNOSIS — J42 Unspecified chronic bronchitis: Secondary | ICD-10-CM

## 2017-11-03 LAB — LIPASE, BLOOD: Lipase: 105 U/L — ABNORMAL HIGH (ref 11–51)

## 2017-11-03 LAB — BASIC METABOLIC PANEL
ANION GAP: 14 (ref 5–15)
BUN: 18 mg/dL (ref 8–23)
CO2: 25 mmol/L (ref 22–32)
CREATININE: 1.14 mg/dL — AB (ref 0.44–1.00)
Calcium: 8.5 mg/dL — ABNORMAL LOW (ref 8.9–10.3)
Chloride: 103 mmol/L (ref 98–111)
GFR, EST AFRICAN AMERICAN: 52 mL/min — AB (ref >60)
GFR, EST NON AFRICAN AMERICAN: 45 mL/min — AB (ref >60)
Glucose, Bld: 88 mg/dL (ref 70–99)
Potassium: 3.5 mmol/L (ref 3.5–5.1)
SODIUM: 142 mmol/L (ref 135–145)

## 2017-11-03 LAB — CBC
HCT: 32.1 % — ABNORMAL LOW (ref 36.0–46.0)
HEMOGLOBIN: 10.1 g/dL — AB (ref 12.0–15.0)
MCH: 32.2 pg (ref 26.0–34.0)
MCHC: 31.5 g/dL (ref 30.0–36.0)
MCV: 102.2 fL — ABNORMAL HIGH (ref 78.0–100.0)
PLATELETS: 257 10*3/uL (ref 150–400)
RBC: 3.14 MIL/uL — AB (ref 3.87–5.11)
RDW: 13.6 % (ref 11.5–15.5)
WBC: 6 10*3/uL (ref 4.0–10.5)

## 2017-11-03 LAB — HEPARIN LEVEL (UNFRACTIONATED): HEPARIN UNFRACTIONATED: 0.77 [IU]/mL — AB (ref 0.30–0.70)

## 2017-11-03 LAB — APTT: aPTT: 106 seconds — ABNORMAL HIGH (ref 24–36)

## 2017-11-03 MED ORDER — HEPARIN (PORCINE) IN NACL 100-0.45 UNIT/ML-% IJ SOLN
1100.0000 [IU]/h | INTRAMUSCULAR | Status: AC
  Filled 2017-11-03: qty 250

## 2017-11-03 MED ORDER — GADOBENATE DIMEGLUMINE 529 MG/ML IV SOLN
20.0000 mL | Freq: Once | INTRAVENOUS | Status: AC | PRN
  Administered 2017-11-03: 20 mL via INTRAVENOUS

## 2017-11-03 MED ORDER — LACTATED RINGERS IV SOLN
INTRAVENOUS | Status: AC
  Administered 2017-11-03 – 2017-11-04 (×2): via INTRAVENOUS

## 2017-11-03 MED ORDER — ROSUVASTATIN CALCIUM 20 MG PO TABS
20.0000 mg | ORAL_TABLET | Freq: Every day | ORAL | Status: DC
  Administered 2017-11-04 – 2017-11-08 (×4): 20 mg via ORAL
  Filled 2017-11-03 (×7): qty 1

## 2017-11-03 NOTE — Progress Notes (Signed)
   11/03/17 2000  Clinical Encounter Type  Visited With Patient  Visit Type Initial  Referral From Nurse  Consult/Referral To Chaplain  Spiritual Encounters  Spiritual Needs Emotional  Met with pt. Briefly. Chaplain was not able to talk long had to respond to ER. Chaplain will ask on call Chaplain to follow up.
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
## 2017-11-03 NOTE — H&P (View-Only) (Signed)
Subjective: The patient was seen and examined at bedside. She reports improvement in abdominal pain and is requesting for her diet to be advanced.  Objective: Vital signs in last 24 hours: Temp:  [98 F (36.7 C)-98.4 F (36.9 C)] 98.4 F (36.9 C) (09/01 0533) Pulse Rate:  [52-59] 59 (09/01 0533) Resp:  [17-18] 17 (09/01 0533) BP: (127-143)/(72-91) 143/76 (09/01 0533) SpO2:  [99 %-100 %] 99 % (09/01 0533) Weight:  [90.6 kg] 90.6 kg (08/31 1413) Weight change:     PE:nontoxic,mild pallor, no obvious icterus GENERAL:sitting on bed, appears comfortable ABDOMEN:soft, nondistended, nontender, normoactive bowel sounds EXTREMITIES:no edema  Lab Results: Results for orders placed or performed during the hospital encounter of 11/02/17 (from the past 48 hour(s))  Lipase, blood     Status: Abnormal   Collection Time: 11/02/17  7:48 AM  Result Value Ref Range   Lipase 1,701 (H) 11 - 51 U/L    Comment: RESULTS CONFIRMED BY MANUAL DILUTION Performed at [facility], [address] [place], [place] [address]   Comprehensive metabolic panel     Status: Abnormal   Collection Time: 11/02/17  7:48 AM  Result Value Ref Range   Sodium 140 135 - 145 mmol/L   Potassium 3.4 (L) 3.5 - 5.1 mmol/L   Chloride 103 98 - 111 mmol/L   CO2 27 22 - 32 mmol/L   Glucose, Bld 111 (H) 70 - 99 mg/dL   BUN 28 (H) 8 - 23 mg/dL   Creatinine, Ser 1.64 (H) 0.44 - 1.00 mg/dL   Calcium 8.8 (L) 8.9 - 10.3 mg/dL   Total Protein 6.6 6.5 - 8.1 g/dL   Albumin 3.2 (L) 3.5 - 5.0 g/dL   AST 30 15 - 41 U/L   ALT 35 0 - 44 U/L   Alkaline Phosphatase 58 38 - 126 U/L   Total Bilirubin 0.8 0.3 - 1.2 mg/dL   GFR calc non Af Amer 29 (L) >60 mL/min   GFR calc Af Amer 33 (L) >60 mL/min    Comment: (NOTE) The eGFR has been calculated using the CKD EPI equation. This calculation has not been validated in all clinical situations. eGFR's persistently <60 mL/min signify possible Chronic Kidney Disease.    Anion gap 10 5  - 15    Comment: Performed at [facility], [address] [place], [place] [address]  CBC     Status: Abnormal   Collection Time: 11/02/17  7:48 AM  Result Value Ref Range   WBC 6.3 4.0 - 10.5 K/uL   RBC 3.74 (L) 3.87 - 5.11 MIL/uL   Hemoglobin 12.1 12.0 - 15.0 g/dL   HCT 37.5 36.0 - 46.0 %   MCV 100.3 (H) 78.0 - 100.0 fL   MCH 32.4 26.0 - 34.0 pg   MCHC 32.3 30.0 - 36.0 g/dL   RDW 13.5 11.5 - 15.5 %   Platelets 249 150 - 400 K/uL    Comment: Performed at  Hospital Lab, [address] [place], [place] [address]  I-stat troponin, ED     Status: None   Collection Time: 11/02/17  8:01 AM  Result Value Ref Range   Troponin i, poc 0.01 0.00 - 0.08 ng/mL   Comment 3            Comment: Due to the release kinetics of cTnI, a negative result within the first hours of the onset of symptoms does not rule out myocardial infarction with certainty. If myocardial infarction is   still suspected, repeat the test at appropriate intervals.   I-Stat Chem 8, ED     Status: Abnormal   Collection Time: 11/02/17  8:02 AM  Result Value Ref Range   Sodium 140 135 - 145 mmol/L   Potassium 3.5 3.5 - 5.1 mmol/L   Chloride 100 98 - 111 mmol/L   BUN 35 (H) 8 - 23 mg/dL   Creatinine, Ser 1.70 (H) 0.44 - 1.00 mg/dL   Glucose, Bld 107 (H) 70 - 99 mg/dL   Calcium, Ion 1.11 (L) 1.15 - 1.40 mmol/L   TCO2 30 22 - 32 mmol/L   Hemoglobin 12.2 12.0 - 15.0 g/dL   HCT 36.0 36.0 - 46.0 %  I-Stat CG4 Lactic Acid, ED     Status: None   Collection Time: 11/02/17  8:03 AM  Result Value Ref Range   Lactic Acid, Venous 1.49 0.5 - 1.9 mmol/L  Urinalysis, Routine w reflex microscopic     Status: Abnormal   Collection Time: 11/02/17  8:54 AM  Result Value Ref Range   Color, Urine YELLOW YELLOW   APPearance HAZY (A) CLEAR   Specific Gravity, Urine 1.011 1.005 - 1.030   pH 5.0 5.0 - 8.0   Glucose, UA NEGATIVE NEGATIVE mg/dL   Hgb urine dipstick NEGATIVE NEGATIVE   Bilirubin Urine NEGATIVE NEGATIVE    Ketones, ur NEGATIVE NEGATIVE mg/dL   Protein, ur NEGATIVE NEGATIVE mg/dL   Nitrite NEGATIVE NEGATIVE   Leukocytes, UA SMALL (A) NEGATIVE   RBC / HPF 0-5 0 - 5 RBC/hpf   WBC, UA 6-10 0 - 5 WBC/hpf   Bacteria, UA NONE SEEN NONE SEEN   Squamous Epithelial / LPF 6-10 0 - 5   Mucus PRESENT     Comment: Performed at [facility], [address] [place], [place] [address]  I-Stat CG4 Lactic Acid, ED     Status: None   Collection Time: 11/02/17 12:02 PM  Result Value Ref Range   Lactic Acid, Venous 0.78 0.5 - 1.9 mmol/L  Heparin level (unfractionated)     Status: None   Collection Time: 11/02/17  3:01 PM  Result Value Ref Range   Heparin Unfractionated 0.51 0.30 - 0.70 IU/mL    Comment: (NOTE) If heparin results are below expected values, and patient dosage has  been confirmed, suggest follow up testing of antithrombin III levels. Performed at [facility], [place] [address], [place], [place] [address]   APTT     Status: None   Collection Time: 11/02/17  3:01 PM  Result Value Ref Range   aPTT 35 24 - 36 seconds    Comment: Performed at [facility] [address], [place], [place] [address]  APTT     Status: Abnormal   Collection Time: 11/02/17 10:48 PM  Result Value Ref Range   aPTT 89 (H) 24 - 36 seconds    Comment:        IF BASELINE aPTT IS ELEVATED, SUGGEST PATIENT RISK ASSESSMENT BE USED TO DETERMINE APPROPRIATE ANTICOAGULANT THERAPY. Performed at [facility], [place] [address] [place], [place] [address]   Heparin level (unfractionated)     Status: Abnormal   Collection Time: 11/02/17 10:48 PM  Result Value Ref Range   Heparin Unfractionated 0.80 (H) 0.30 - 0.70 IU/mL    Comment: (NOTE) If heparin results are below expected values, and patient dosage has  been confirmed, suggest follow up testing of antithrombin III levels. Performed at [facility], [address] [place], [place] [address]   Basic metabolic panel     Status: Abnormal    Collection Time: 11/03/17  6:04 AM  Result Value Ref Range   Sodium 142 135 - 145 mmol/L   Potassium 3.5 3.5 - 5.1 mmol/L   Chloride 103 98 - 111 mmol/L   CO2 25 22 - 32 mmol/L   Glucose, Bld 88 70 - 99 mg/dL   BUN 18 8 - 23 mg/dL   Creatinine, Ser 1.14 (H) 0.44 - 1.00 mg/dL   Calcium 8.5 (L) 8.9 - 10.3 mg/dL   GFR calc non Af Amer 45 (L) >60 mL/min   GFR calc Af Amer 52 (L) >60 mL/min    Comment: (NOTE) The eGFR has been calculated using the CKD EPI equation. This calculation has not been validated in all clinical situations. eGFR's persistently <60 mL/min signify possible Chronic Kidney Disease.    Anion gap 14 5 - 15    Comment: Performed at [facility], [address] [place], [place] [address]  CBC     Status: Abnormal   Collection Time: 11/03/17  6:04 AM  Result Value Ref Range   WBC 6.0 4.0 - 10.5 K/uL   RBC 3.14 (L) 3.87 - 5.11 MIL/uL   Hemoglobin 10.1 (L) 12.0 - 15.0 g/dL   HCT 32.1 (L) 36.0 - 46.0 %   MCV 102.2 (H) 78.0 - 100.0 fL   MCH 32.2 26.0 - 34.0 pg   MCHC 31.5 30.0 - 36.0 g/dL   RDW 13.6 11.5 - 15.5 %   Platelets 257 150 - 400 K/uL    Comment: Performed at [facility], [address] [place], [place] [address]  Lipase, blood     Status: Abnormal   Collection Time: 11/03/17  6:04 AM  Result Value Ref Range   Lipase 105 (H) 11 - 51 U/L    Comment: Performed at [facility], [address] [place], [place] [address]  Heparin level (unfractionated)     Status: Abnormal   Collection Time: 11/03/17  6:04 AM  Result Value Ref Range   Heparin Unfractionated 0.77 (H) 0.30 - 0.70 IU/mL    Comment: (NOTE) If heparin results are below expected values, and patient dosage has  been confirmed, suggest follow up testing of antithrombin III levels. Performed at [facility], [address] [place], [place] [address]   APTT     Status: Abnormal   Collection Time: 11/03/17  6:04 AM  Result Value Ref Range   aPTT 106 (H) 24 - 36 seconds     Comment:        IF BASELINE aPTT IS ELEVATED, SUGGEST PATIENT RISK ASSESSMENT BE USED TO DETERMINE APPROPRIATE ANTICOAGULANT THERAPY. Performed at  Hospital Lab, [address] [place], [place] [address]     Studies/Results: Ct Abdomen Pelvis W Contrast  Result Date: 11/02/2017 CLINICAL DATA:  Abdominal pain and distention for the past 2 days. EXAM: CT ABDOMEN AND PELVIS WITH CONTRAST TECHNIQUE: Multidetector CT imaging of the abdomen and pelvis was performed using the standard protocol following bolus administration of intravenous contrast. CONTRAST:  100mL ISOVUE-300 IOPAMIDOL (ISOVUE-300) INJECTION 61% COMPARISON:  CT abdomen pelvis dated October 08, 2017. FINDINGS: Lower chest: No acute abnormality. Hepatobiliary: No focal liver abnormality. Unchanged 7 mm small cyst versus hemangioma in the left hepatic lobe. New gallbladder distention with diffuse wall thickening. New mild common bile duct and intrahepatic biliary dilatation.   Pancreas: New mild dilatation of the proximal main pancreatic duct with mild peripancreatic inflammatory changes. The pancreas enhances normally. No fluid collection. Spleen: Normal in size without focal abnormality. Adrenals/Urinary Tract: Adrenal glands are unremarkable. Kidneys are normal, without renal calculi, focal lesion, or hydronephrosis. Stable bilateral renal cysts. Bladder is unremarkable. Stomach/Bowel: Small hiatal hernia. Stomach is otherwise within normal limits. Appendix appears normal. No evidence of bowel wall thickening, distention, or inflammatory changes. Vascular/Lymphatic: Mild aortic atherosclerosis. No enlarged abdominal or pelvic lymph nodes. Reproductive: Status post hysterectomy. No adnexal masses. Other: No free fluid or pneumoperitoneum. Unchanged tiny fat containing umbilical hernia. Musculoskeletal: No acute or significant osseous findings. IMPRESSION: 1. New biliary dilatation and main pancreatic duct dilatation with peripancreatic  inflammatory changes. Findings are consistent with obstruction at the ampulla with pancreatitis, favored secondary to choledocholithiasis, although no discrete radiopaque gallstone is identified. Correlation with ERCP is recommended. 2. New gallbladder distention with prominent wall thickening, concerning for acute cholecystitis. 3.  Aortic atherosclerosis (ICD10-I70.0). Electronically Signed   By: Titus Dubin M.D.   On: 11/02/2017 10:14   Mr 3d Recon At Scanner  Result Date: 11/03/2017 CLINICAL DATA:  Pancreatitis acute pancreatitis. EXAM: MRI ABDOMEN WITHOUT AND WITH CONTRAST (INCLUDING MRCP) TECHNIQUE: Multiplanar multisequence MR imaging of the abdomen was performed both before and after the administration of intravenous contrast. Heavily T2-weighted images of the biliary and pancreatic ducts were obtained, and three-dimensional MRCP images were rendered by post processing. CONTRAST:  26m MULTIHANCE GADOBENATE DIMEGLUMINE 529 MG/ML IV SOLN COMPARISON:  CT 11/02/2017 FINDINGS: Lower chest:  Lung bases are clear. Hepatobiliary: No significant intrahepatic duct dilatation. No focal hepatic lesion. There are multiple gallstones layering dependently within the gallbladder ranging size from 2 mm to 8 mm. There approximately 24 stones. There is mild pericholecystic fluid and gallbladder wall thickening. The common bile duct is dilated to 8 mm (image 28/7). There is at least 1 distal stone in the common bile duct measuring approximately 2 mm on image 31/7). Additional stones or sludge or stones are evident on MRCP sequence image 1/series 14. Pancreas: No significant pancreatic duct dilatation. Small amount fluid along the body and tail the pancreas. The pancreatic parenchyma is normal signal intensity. Postcontrast imaging demonstrates uniform enhancement the pancreatic parenchyma. No organized fluid collections. Spleen: Normal spleen. Adrenals/urinary tract: Adrenal glands and kidneys are normal. Stomach/Bowel:  Stomach and limited of the small bowel is unremarkable Vascular/Lymphatic: Abdominal aortic normal caliber. No retroperitoneal periportal lymphadenopathy. Musculoskeletal: No aggressive osseous lesion IMPRESSION: 1. Findings consistent with gallstone pancreatitis. 2. Several small filling defects within distal common bile duct consistent with choledocholithiasis. Mild common bile duct dilatation. 3. Multiple gallstones layering in the lumen gallbladder of varying size. Mild gallbladder wall thickening and pericholecystic fluid. Recommend clinical correlation for acute cholecystitis. 4. Mild pancreatitis. No evidence of pancreatic necrosis. No organized fluid collections. These results will be called to the ordering clinician or representative by the Radiologist Assistant, and communication documented in the PACS or zVision Dashboard. Electronically Signed   By: SSuzy BouchardM.D.   On: 11/03/2017 10:29   Mr Abdomen Mrcp WMoise BoringContast  Result Date: 11/03/2017 CLINICAL DATA:  Pancreatitis acute pancreatitis. EXAM: MRI ABDOMEN WITHOUT AND WITH CONTRAST (INCLUDING MRCP) TECHNIQUE: Multiplanar multisequence MR imaging of the abdomen was performed both before and after the administration of intravenous contrast. Heavily T2-weighted images of the biliary and pancreatic ducts were obtained, and three-dimensional MRCP images were rendered by post processing. CONTRAST:  255mMULTIHANCE GADOBENATE DIMEGLUMINE 529 MG/ML IV  SOLN COMPARISON:  CT 11/02/2017 FINDINGS: Lower chest:  Lung bases are clear. Hepatobiliary: No significant intrahepatic duct dilatation. No focal hepatic lesion. There are multiple gallstones layering dependently within the gallbladder ranging size from 2 mm to 8 mm. There approximately 24 stones. There is mild pericholecystic fluid and gallbladder wall thickening. The common bile duct is dilated to 8 mm (image 28/7). There is at least 1 distal stone in the common bile duct measuring approximately 2 mm  on image 31/7). Additional stones or sludge or stones are evident on MRCP sequence image 1/series 14. Pancreas: No significant pancreatic duct dilatation. Small amount fluid along the body and tail the pancreas. The pancreatic parenchyma is normal signal intensity. Postcontrast imaging demonstrates uniform enhancement the pancreatic parenchyma. No organized fluid collections. Spleen: Normal spleen. Adrenals/urinary tract: Adrenal glands and kidneys are normal. Stomach/Bowel: Stomach and limited of the small bowel is unremarkable Vascular/Lymphatic: Abdominal aortic normal caliber. No retroperitoneal periportal lymphadenopathy. Musculoskeletal: No aggressive osseous lesion IMPRESSION: 1. Findings consistent with gallstone pancreatitis. 2. Several small filling defects within distal common bile duct consistent with choledocholithiasis. Mild common bile duct dilatation. 3. Multiple gallstones layering in the lumen gallbladder of varying size. Mild gallbladder wall thickening and pericholecystic fluid. Recommend clinical correlation for acute cholecystitis. 4. Mild pancreatitis. No evidence of pancreatic necrosis. No organized fluid collections. These results will be called to the ordering clinician or representative by the Radiologist Assistant, and communication documented in the PACS or zVision Dashboard. Electronically Signed   By: Suzy Bouchard M.D.   On: 11/03/2017 10:29    Medications: I have reviewed the patient's current medications.  Assessment: 1.gallstone pancreatitis-No organized fluid collections, no necrosis 2. CBD of 8 mm with distal CBD stones 3.mild gallbladder wall thickening and pericholecystic fluid compatible with acute cholecystitis 4.renal impairment-improvement in GFR and creatinine   Plan: 1.Clear liquid diet today Patient states last dose of Xarelto was Thursday evening. Plan ERCP tomorrow, plan holding IV heparin after midnight. Clear liquid diet today and nothing by mouth  post midnight. The risks(infection, bleeding, perforation, pancreatitis and even death) and benefits of the procedure were discussed with the patient in details. She understands and verbalizes consent.  2.4.5 cm ascending thoracic aortic aneurysm with atherosclerotic calcifications noted on CT Angio from 10/08/17. Recommend preoperative cardiology clearance for ERCP and cholecystectomy. Discussed with patient's hospitalist Dr. Alfredia Ferguson.  3. Surgical consult for evaluation for cholecystectomy  Ronnette Juniper 11/03/2017, 11:29 AM   Pager [phone] If no answer or after 5 PM call [phone]

## 2017-11-03 NOTE — Progress Notes (Signed)
[facility] for Heparin  Indication: pulmonary embolus   Allergies  Allergen Reactions  . Penicillins Anaphylaxis    Has patient had a PCN reaction causing immediate rash, facial/tongue/throat swelling, SOB or lightheadedness with hypotension: yes Has patient had a PCN reaction causing severe rash involving mucus membranes or skin necrosis: no Has patient had a PCN reaction that required hospitalization yes Has patient had a PCN reaction occurring within the last 10 years: no If all of the above answers are "NO", then may proceed with Cephalosporin use.   . Sulfa Antibiotics Hives  . Oxycodone Other (See Comments)    hallucanations     Patient Measurements: Height: 5\' 6"  (167.6 cm) Weight: 199 lb 11.8 oz (90.6 kg) IBW/kg (Calculated) : 59.3 Heparin Dosing Weight: 81 kg  Vital Signs: Temp: 98.3 F (36.8 C) (08/31 2100) Temp Source: Oral (08/31 2100) BP: 127/72 (08/31 2100) Pulse Rate: 57 (08/31 2100)  Labs: Recent Labs    11/02/17 0748 11/02/17 0802 11/02/17 1501 11/02/17 2248  HGB 12.1 12.2  --   --   HCT 37.5 36.0  --   --   PLT 249  --   --   --   APTT  --   --  35 89*  HEPARINUNFRC  --   --  0.51 0.80*  CREATININE 1.64* 1.70*  --   --     Estimated Creatinine Clearance: 30.4 mL/min (A) (by C-G formula based on SCr of 1.7 mg/dL (H)).  Assessment: 77 year old female with h/o PE, Xarelto on hold, for heparin  Goal of Therapy:  Heparin level 0.3-0.7 units/ml Monitor platelets by anticoagulation protocol: Yes  PTT = 66 to 102 seconds   Plan:  Continue Heparin at current rate Follow-up am labs.   Phillis Knack, PharmD, BCPS  11/03/2017,12:07 AM

## 2017-11-03 NOTE — Consult Note (Signed)
CARDIOLOGY CONSULT NOTE  Patient ID: Denise Cline, MRN: [id number], DOB/AGE: 10-Sep-1940 77 y.o. Admit date: 11/02/2017 Date of Consult: 11/03/2017  Primary Physician: Josetta Huddle, MD Primary Cardiologist: [facility] Denise Cline is a 77 y.o. female who is being seen today for the evaluation of preoperative assessment at the request of Dr . Donne Hazel  Chief Complaint: back pacin   HPI Denise Cline is a 77 y.o. female seen for preoperative evaluation presenting with gallstone pancreatitis with confirmed stone in her common bile duct.  She is pending ERCP and possible cholecystectomy   She was seen 10 days ago in consultation by Dr. Daneen Schick.  He notes that she has a history of coronary artery disease by CT scanning, acsending thoracic aneurysm.  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  Her note describes inability to tolerate beta-blockers.  Relatively nondescript but with sinus bradycardia may have been related to this    Past Medical History:  Diagnosis Date  . Anxiety   . COPD (chronic obstructive pulmonary disease) ([place])   . DVT (deep venous thrombosis) ([place]) 2014, 2017  . Fibroid   . History of shingles   . Hypertension   . PE (pulmonary thromboembolism) ([place]) 2017  . Thyroid disease    Nodules  . Varicose veins       Surgical History:  Past Surgical History:  Procedure Laterality Date  . ABDOMINAL HYSTERECTOMY    . COLONOSCOPY N/A 10/05/2014   Procedure: COLONOSCOPY;  Surgeon: Ronald Lobo, MD;  Location: [facility];  Service: Endoscopy;  Laterality: N/A;  . OOPHORECTOMY     One ovary removed  . TOTAL THYROIDECTOMY         Home Meds: Prior to Admission medications   Medication Sig Start Date End Date Taking? Authorizing Provider  acetaminophen (TYLENOL) 650 MG CR tablet Take 650-1,300 mg by mouth 2 (two) times daily as needed for pain.   Yes [provider]  aspirin EC 81 MG tablet Take 81 mg by mouth See admin instructions. Take one tablet by mouth only on Mon, Wed, Friday.   Yes [provider]  buPROPion (WELLBUTRIN SR) 150 MG 12 hr tablet Take 150 mg by mouth daily.    Yes [provider]  calcium-vitamin D (OSCAL WITH D) 250-125 MG-UNIT tablet Take 2 tablets by mouth daily.   Yes [provider]  losartan-hydrochlorothiazide (HYZAAR) 100-25 MG tablet Take 1 tablet by mouth daily.   Yes [provider]  methocarbamol (ROBAXIN) 500 MG tablet Take 500 mg by mouth daily as needed for muscle spasms.  07/08/17  Yes [provider]  Multiple Vitamins-Minerals (CENTRUM SILVER ADULT 50+) TABS Take 1 tablet by mouth daily.   Yes [provider]  OVER THE COUNTER MEDICATION Take 1 capsule by mouth at bedtime. (ColonClenz)   Yes [provider]  pantoprazole (PROTONIX) 40 MG tablet Take 1 tablet (40 mg total) by mouth daily. 10/21/17  Yes Belva Crome, MD  Polyvinyl Alcohol-Povidone (REFRESH OP) Place 1 drop into both eyes 3 (three) times daily as needed (DRY EYES).    Yes [provider]  rivaroxaban (XARELTO) 20 MG TABS tablet Take 20 mg  by mouth daily with supper.   Yes [provider]  rosuvastatin (CRESTOR) 20 MG tablet Take 1 tablet (20 mg total) by mouth daily. 10/25/17 10/25/18 Yes Belva Crome, MD  traMADol (ULTRAM) 50 MG tablet Take 1 tablet (50 mg total) by mouth every 6 (six) hours as needed. Patient taking differently: Take 50 mg by mouth every 6 (six) hours as needed for moderate pain.  10/09/17  Yes Fredia Sorrow, MD  triamcinolone (KENALOG) 0.025 % cream Apply 1 application topically daily as needed for itching.  03/29/17  Yes [provider]  traMADol (ULTRAM) 50 MG tablet Take 1 tablet (50 mg total) by mouth every 6 (six) hours as needed for moderate pain. Patient not taking: Reported on 11/02/2017 08/23/15   Merrily Pew, MD    Inpatient Medications:  . [START ON 11/04/2017] aspirin EC  81 mg Oral Q M,W,F  . buPROPion  150 mg Oral Daily  . calcium-vitamin D  1 tablet Oral Q breakfast  . losartan  100 mg Oral Daily   And  . hydrochlorothiazide  25 mg Oral Daily  . multivitamin with minerals  1 tablet Oral Daily  . pantoprazole  40 mg Oral Daily  . polyvinyl alcohol  1 drop Both Eyes TID  . rosuvastatin  20 mg Oral Daily      Allergies:  Allergies  Allergen Reactions  . Penicillins Anaphylaxis    Has patient had a PCN reaction causing immediate rash, facial/tongue/throat swelling, SOB or lightheadedness with hypotension: yes Has patient had a PCN reaction causing severe rash involving mucus membranes or skin necrosis: no Has patient had a PCN reaction that required hospitalization yes Has patient had a PCN reaction occurring within the last 10 years: no If all of the above answers are "NO", then may proceed with Cephalosporin use.   . Sulfa Antibiotics Hives  . Oxycodone Other (See Comments)    hallucanations     Social History   Socioeconomic History  . Marital status: Single    Spouse name: Not on file  . Number of children: 3  . Years of education: Not on file  . Highest education level: Not on file  Occupational History  . Not on file  Social Needs  . Financial resource strain: Not on file  . Food insecurity:    Worry: Not on file    Inability: Not on file  . Transportation needs:    Medical: Not on file    Non-medical: Not on file  Tobacco Use  . Smoking status: Former Smoker    Packs/day: 2.00    Years: 40.00    Pack years: 80.00    Types: Cigarettes    Last attempt to quit: 05/30/2008    Years since quitting: 9.4  . Smokeless tobacco: Former Systems developer    Quit  date: 05/12/2008  . Tobacco comment: Counseled to remain smoke free  Substance and Sexual Activity  . Alcohol use: No    Alcohol/week: 0.0 standard drinks    Comment: Rare  . Drug use: No  . Sexual activity: Not Currently    Birth control/protection: Surgical  Lifestyle  . Physical activity:    Days per week: Not on file    Minutes per session: Not on file  . Stress: Not on file  Relationships  . Social connections:    Talks on phone: Not on file    Gets together: Not on file    Attends religious service: Not on file  Active member of club or organization: Not on file    Attends meetings of clubs or organizations: Not on file    Relationship status: Not on file  . Intimate partner violence:    Fear of current or ex partner: Not on file    Emotionally abused: Not on file    Physically abused: Not on file    Forced sexual activity: Not on file  Other Topics Concern  . Not on file  Social History Narrative  . Not on file     Family History  Problem Relation Age of Onset  . Crohn's disease Mother   . Stroke Father   . Diabetes Sister   . Hypertension Sister   . Hypertension Brother      ROS:  Please see the history of present illness.     All other systems reviewed and negative.    Physical Exam: Blood pressure (!) 143/76, pulse (!) 59, temperature 98.4 F (36.9 C), temperature source Oral, resp. rate 17, height 5\' 6"  (1.676 m), weight 90.6 kg, SpO2 99 %. General: Well developed, well nourished female in no acute distress. Head: Normocephalic, atraumatic, sclera non-icteric, no xanthomas, nares are without discharge. EENT: normal Lymph Nodes:  none Back: without scoliosis/kyphosis , no CVA tendersness Neck: Negative for carotid bruits. JVD not elevated. Lungs: Clear bilaterally to auscultation without wheezes, rales, or rhonchi. Breathing is unlabored. Heart: RRR with S1 S2.   murmur , rubs, or gallops appreciated. Abdomen: Soft, non-tender, non-distended with  normoactive bowel sounds. No hepatomegaly. No rebound/guarding. No obvious abdominal masses. Msk:  Strength and tone appear normal for age. Extremities: No clubbing or cyanosis. No edema.  Distal pedal pulses are 2+ and equal bilaterally. Skin: Warm and Dry Neuro: Alert and oriented X 3. CN III-XII intact Grossly normal sensory and motor function . Psych:  Responds to questions appropriately with a normal affect.      Labs: Cardiac Enzymes No results for input(s): CKTOTAL, CKMB, TROPONINI in the last 72 hours. CBC Lab Results  Component Value Date   WBC 6.0 11/03/2017   HGB 10.1 (L) 11/03/2017   HCT 32.1 (L) 11/03/2017   MCV 102.2 (H) 11/03/2017   PLT 257 11/03/2017   PROTIME: No results for input(s): LABPROT, INR in the last 72 hours. Chemistry  Recent Labs  Lab 11/02/17 0748  11/03/17 0604  NA 140   < > 142  K 3.4*   < > 3.5  CL 103   < > 103  CO2 27  --  25  BUN 28*   < > 18  CREATININE 1.64*   < > 1.14*  CALCIUM 8.8*  --  8.5*  PROT 6.6  --   --   BILITOT 0.8  --   --   ALKPHOS 58  --   --   ALT 35  --   --   AST 30  --   --   GLUCOSE 111*   < > 88   < > = values in this interval not displayed.   Lipids Lab Results  Component Value Date   CHOL 226 (H) 10/21/2017   HDL 77 10/21/2017   LDLCALC 133 (H) 10/21/2017   TRIG 78 10/21/2017   BNP No results found for: PROBNP Thyroid Function Tests: No results for input(s): TSH, T4TOTAL, T3FREE, THYROIDAB in the last 72 hours.  Invalid input(s): FREET3    Miscellaneous Lab Results  Component Value Date   DDIMER <0.27 10/08/2017    Radiology/Studies:  Dg Chest 2 View  Result Date: 10/08/2017 CLINICAL DATA:  Shortness of breath. EXAM: CHEST - 2 VIEW COMPARISON:  Radiographs of June 12, 2016. FINDINGS: Stable cardiomegaly. No pneumothorax or pleural effusion is noted. Both lungs are clear. The visualized skeletal structures are unremarkable. IMPRESSION: No active cardiopulmonary disease. Electronically Signed    By: Marijo Conception, M.D.   On: 10/08/2017 19:31   Ct Angio Chest Pe W/cm &/or Wo Cm  Result Date: 10/08/2017 CLINICAL DATA:  77 year old female with back pain originating from the right shoulder blade, worse with movement. Pain also noted of the abdomen starting posteriorly on the right commencing earlier today. EXAM: CT ANGIOGRAPHY CHEST CT ABDOMEN AND PELVIS WITH CONTRAST TECHNIQUE: Multidetector CT imaging of the chest was performed using the standard protocol during bolus administration of intravenous contrast. Multiplanar CT image reconstructions and MIPs were obtained to evaluate the vascular anatomy. Multidetector CT imaging of the abdomen and pelvis was performed using the standard protocol during bolus administration of intravenous contrast. CONTRAST:  175mL ISOVUE-370 IOPAMIDOL (ISOVUE-370) INJECTION 76% COMPARISON:  CT AP 10/04/2014, chest radiograph 10/08/2017 and chest CT 04/08/2017 FINDINGS: CTA CHEST FINDINGS Cardiovascular: 4.5 cm ascending thoracic aortic aneurysm with atherosclerotic calcifications noted. Preferential opacification of the pulmonary arteries without acute pulmonary embolus to the segmental level. Heart size is mildly enlarged with left main and three-vessel coronary arteriosclerosis. No pericardial effusion. Mediastinum/Nodes: No pathologically enlarged mediastinal nor hilar lymphadenopathy. Partially included axillary lymph loads are mildly enlarged but stable since prior measuring up to 1 cm short axis. Thyromegaly with enlargement of the right lobe extending retroclavicular. No dominant mass however is seen. Patent trachea and mainstem bronchi. The CT appearance of the esophagus is unremarkable. Small hiatal hernia. Lungs/Pleura: No pulmonary consolidation, effusion or pneumothorax. Passive atelectasis and subsegmental atelectasis noted at the lung bases. Punctate granuloma in the right lower lobe is unchanged. Pulmonary cysts in the right lower lobe are redemonstrated.  Musculoskeletal: No chest wall abnormality. No acute or significant osseous findings. Review of the MIP images confirms the above findings. CT ABDOMEN and PELVIS FINDINGS Hepatobiliary: 7 mm hypodensity in the left hepatic lobe likely to represent a small cyst or hemangioma but is too small to further characterize. Physiologic distention of the gallbladder without wall thickening or calculi. No biliary dilatation is seen. Pancreas: Normal Spleen: Normal Adrenals/Urinary Tract: Normal bilateral adrenal glands. Small cysts of both kidneys the largest in the upper pole the left kidney measuring approximately 7 mm. No enhancing renal mass. No nephrolithiasis nor hydroureteronephrosis. The urinary bladder is unremarkable for the degree of distention. Stomach/Bowel: Decompressed stomach with normal duodenal sweep and ligament of Treitz position. No small bowel obstruction or inflammation. Moderate stool retention within the colon without obstruction or inflammation. The distal and terminal ileum are normal. Normal appearing appendix. Vascular/Lymphatic: Nonaneurysmal atherosclerotic aorta with patent branch vessels. No retroperitoneal, mesenteric or pelvic sidewall adenopathy. No inguinal adenopathy. Reproductive: Hysterectomy.  No adnexal mass. Other: No free air nor free fluid. Tiny fat containing umbilical hernia. Musculoskeletal: No acute or significant osseous findings. Review of the MIP images confirms the above findings. IMPRESSION: Chest CT: 1. 4.5 cm ascending thoracic aortic aneurysm with atherosclerosis. Ascending thoracic aortic aneurysm. Recommend semi-annual imaging followup by CTA or MRA and referral to cardiothoracic surgery if not already obtained. This recommendation follows 2010 ACCF/AHA/AATS/ACR/ASA/SCA/SCAI/SIR/STS/SVM Guidelines for the Diagnosis and Management of Patients With Thoracic Aortic Disease. Circulation. 2010; 121: J628-Z662 previously this was estimated 4.3 cm. 2. Coronary  arteriosclerosis with stable cardiomegaly. 3.  No acute pulmonary embolus. 4. No active pulmonary disease. 5. Mild thyromegaly. 6. No acute osseous abnormality. CT AP: 1. Tiny too small to characterize hepatic and renal hypodensities statistically consistent with cysts as above. 2. No acute bowel obstruction or inflammation. 3. Nonaneurysmal atherosclerotic abdominal aorta. Electronically Signed   By: Ashley Royalty M.D.   On: 10/08/2017 23:27   Ct Abdomen Pelvis W Contrast  Result Date: 11/02/2017 CLINICAL DATA:  Abdominal pain and distention for the past 2 days. EXAM: CT ABDOMEN AND PELVIS WITH CONTRAST TECHNIQUE: Multidetector CT imaging of the abdomen and pelvis was performed using the standard protocol following bolus administration of intravenous contrast. CONTRAST:  130mL ISOVUE-300 IOPAMIDOL (ISOVUE-300) INJECTION 61% COMPARISON:  CT abdomen pelvis dated October 08, 2017. FINDINGS: Lower chest: No acute abnormality. Hepatobiliary: No focal liver abnormality. Unchanged 7 mm small cyst versus hemangioma in the left hepatic lobe. New gallbladder distention with diffuse wall thickening. New mild common bile duct and intrahepatic biliary dilatation. Pancreas: New mild dilatation of the proximal main pancreatic duct with mild peripancreatic inflammatory changes. The pancreas enhances normally. No fluid collection. Spleen: Normal in size without focal abnormality. Adrenals/Urinary Tract: Adrenal glands are unremarkable. Kidneys are normal, without renal calculi, focal lesion, or hydronephrosis. Stable bilateral renal cysts. Bladder is unremarkable. Stomach/Bowel: Small hiatal hernia. Stomach is otherwise within normal limits. Appendix appears normal. No evidence of bowel wall thickening, distention, or inflammatory changes. Vascular/Lymphatic: Mild aortic atherosclerosis. No enlarged abdominal or pelvic lymph nodes. Reproductive: Status post hysterectomy. No adnexal masses. Other: No free fluid or pneumoperitoneum.  Unchanged tiny fat containing umbilical hernia. Musculoskeletal: No acute or significant osseous findings. IMPRESSION: 1. New biliary dilatation and main pancreatic duct dilatation with peripancreatic inflammatory changes. Findings are consistent with obstruction at the ampulla with pancreatitis, favored secondary to choledocholithiasis, although no discrete radiopaque gallstone is identified. Correlation with ERCP is recommended. 2. New gallbladder distention with prominent wall thickening, concerning for acute cholecystitis. 3.  Aortic atherosclerosis (ICD10-I70.0). Electronically Signed   By: Titus Dubin M.D.   On: 11/02/2017 10:14   Ct Abdomen Pelvis W Contrast  Result Date: 10/08/2017 CLINICAL DATA:  77 year old female with back pain originating from the right shoulder blade, worse with movement. Pain also noted of the abdomen starting posteriorly on the right commencing earlier today. EXAM: CT ANGIOGRAPHY CHEST CT ABDOMEN AND PELVIS WITH CONTRAST TECHNIQUE: Multidetector CT imaging of the chest was performed using the standard protocol during bolus administration of intravenous contrast. Multiplanar CT image reconstructions and MIPs were obtained to evaluate the vascular anatomy. Multidetector CT imaging of the abdomen and pelvis was performed using the standard protocol during bolus administration of intravenous contrast. CONTRAST:  14mL ISOVUE-370 IOPAMIDOL (ISOVUE-370) INJECTION 76% COMPARISON:  CT AP 10/04/2014, chest radiograph 10/08/2017 and chest CT 04/08/2017 FINDINGS: CTA CHEST FINDINGS Cardiovascular: 4.5 cm ascending thoracic aortic aneurysm with atherosclerotic calcifications noted. Preferential opacification of the pulmonary arteries without acute pulmonary embolus to the segmental level. Heart size is mildly enlarged with left main and three-vessel coronary arteriosclerosis. No pericardial effusion. Mediastinum/Nodes: No pathologically enlarged mediastinal nor hilar lymphadenopathy.  Partially included axillary lymph loads are mildly enlarged but stable since prior measuring up to 1 cm short axis. Thyromegaly with enlargement of the right lobe extending retroclavicular. No dominant mass however is seen. Patent trachea and mainstem bronchi. The CT appearance of the esophagus is unremarkable. Small hiatal hernia. Lungs/Pleura: No pulmonary consolidation, effusion or pneumothorax. Passive atelectasis and subsegmental atelectasis noted at the lung bases. Punctate granuloma in the  right lower lobe is unchanged. Pulmonary cysts in the right lower lobe are redemonstrated. Musculoskeletal: No chest wall abnormality. No acute or significant osseous findings. Review of the MIP images confirms the above findings. CT ABDOMEN and PELVIS FINDINGS Hepatobiliary: 7 mm hypodensity in the left hepatic lobe likely to represent a small cyst or hemangioma but is too small to further characterize. Physiologic distention of the gallbladder without wall thickening or calculi. No biliary dilatation is seen. Pancreas: Normal Spleen: Normal Adrenals/Urinary Tract: Normal bilateral adrenal glands. Small cysts of both kidneys the largest in the upper pole the left kidney measuring approximately 7 mm. No enhancing renal mass. No nephrolithiasis nor hydroureteronephrosis. The urinary bladder is unremarkable for the degree of distention. Stomach/Bowel: Decompressed stomach with normal duodenal sweep and ligament of Treitz position. No small bowel obstruction or inflammation. Moderate stool retention within the colon without obstruction or inflammation. The distal and terminal ileum are normal. Normal appearing appendix. Vascular/Lymphatic: Nonaneurysmal atherosclerotic aorta with patent branch vessels. No retroperitoneal, mesenteric or pelvic sidewall adenopathy. No inguinal adenopathy. Reproductive: Hysterectomy.  No adnexal mass. Other: No free air nor free fluid. Tiny fat containing umbilical hernia. Musculoskeletal: No  acute or significant osseous findings. Review of the MIP images confirms the above findings. IMPRESSION: Chest CT: 1. 4.5 cm ascending thoracic aortic aneurysm with atherosclerosis. Ascending thoracic aortic aneurysm. Recommend semi-annual imaging followup by CTA or MRA and referral to cardiothoracic surgery if not already obtained. This recommendation follows 2010 ACCF/AHA/AATS/ACR/ASA/SCA/SCAI/SIR/STS/SVM Guidelines for the Diagnosis and Management of Patients With Thoracic Aortic Disease. Circulation. 2010; 121: [id number] previously this was estimated 4.3 cm. 2. Coronary arteriosclerosis with stable cardiomegaly. 3. No acute pulmonary embolus. 4. No active pulmonary disease. 5. Mild thyromegaly. 6. No acute osseous abnormality. CT AP: 1. Tiny too small to characterize hepatic and renal hypodensities statistically consistent with cysts as above. 2. No acute bowel obstruction or inflammation. 3. Nonaneurysmal atherosclerotic abdominal aorta. Electronically Signed   By: Ashley Royalty M.D.   On: 10/08/2017 23:27   Mr 3d Recon At Scanner  Result Date: 11/03/2017 CLINICAL DATA:  Pancreatitis acute pancreatitis. EXAM: MRI ABDOMEN WITHOUT AND WITH CONTRAST (INCLUDING MRCP) TECHNIQUE: Multiplanar multisequence MR imaging of the abdomen was performed both before and after the administration of intravenous contrast. Heavily T2-weighted images of the biliary and pancreatic ducts were obtained, and three-dimensional MRCP images were rendered by post processing. CONTRAST:  20mL MULTIHANCE GADOBENATE DIMEGLUMINE 529 MG/ML IV SOLN COMPARISON:  CT 11/02/2017 FINDINGS: Lower chest:  Lung bases are clear. Hepatobiliary: No significant intrahepatic duct dilatation. No focal hepatic lesion. There are multiple gallstones layering dependently within the gallbladder ranging size from 2 mm to 8 mm. There approximately 24 stones. There is mild pericholecystic fluid and gallbladder wall thickening. The common bile duct is dilated to 8 mm  (image 28/7). There is at least 1 distal stone in the common bile duct measuring approximately 2 mm on image 31/7). Additional stones or sludge or stones are evident on MRCP sequence image 1/series 14. Pancreas: No significant pancreatic duct dilatation. Small amount fluid along the body and tail the pancreas. The pancreatic parenchyma is normal signal intensity. Postcontrast imaging demonstrates uniform enhancement the pancreatic parenchyma. No organized fluid collections. Spleen: Normal spleen. Adrenals/urinary tract: Adrenal glands and kidneys are normal. Stomach/Bowel: Stomach and limited of the small bowel is unremarkable Vascular/Lymphatic: Abdominal aortic normal caliber. No retroperitoneal periportal lymphadenopathy. Musculoskeletal: No aggressive osseous lesion IMPRESSION: 1. Findings consistent with gallstone pancreatitis. 2. Several small filling defects within distal common  bile duct consistent with choledocholithiasis. Mild common bile duct dilatation. 3. Multiple gallstones layering in the lumen gallbladder of varying size. Mild gallbladder wall thickening and pericholecystic fluid. Recommend clinical correlation for acute cholecystitis. 4. Mild pancreatitis. No evidence of pancreatic necrosis. No organized fluid collections. These results will be called to the ordering clinician or representative by the Radiologist Assistant, and communication documented in the PACS or zVision Dashboard. Electronically Signed   By: Suzy Bouchard M.D.   On: 11/03/2017 10:29   Mr Abdomen Mrcp Moise Boring Contast  Result Date: 11/03/2017 CLINICAL DATA:  Pancreatitis acute pancreatitis. EXAM: MRI ABDOMEN WITHOUT AND WITH CONTRAST (INCLUDING MRCP) TECHNIQUE: Multiplanar multisequence MR imaging of the abdomen was performed both before and after the administration of intravenous contrast. Heavily T2-weighted images of the biliary and pancreatic ducts were obtained, and three-dimensional MRCP images were rendered by post  processing. CONTRAST:  39mL MULTIHANCE GADOBENATE DIMEGLUMINE 529 MG/ML IV SOLN COMPARISON:  CT 11/02/2017 FINDINGS: Lower chest:  Lung bases are clear. Hepatobiliary: No significant intrahepatic duct dilatation. No focal hepatic lesion. There are multiple gallstones layering dependently within the gallbladder ranging size from 2 mm to 8 mm. There approximately 24 stones. There is mild pericholecystic fluid and gallbladder wall thickening. The common bile duct is dilated to 8 mm (image 28/7). There is at least 1 distal stone in the common bile duct measuring approximately 2 mm on image 31/7). Additional stones or sludge or stones are evident on MRCP sequence image 1/series 14. Pancreas: No significant pancreatic duct dilatation. Small amount fluid along the body and tail the pancreas. The pancreatic parenchyma is normal signal intensity. Postcontrast imaging demonstrates uniform enhancement the pancreatic parenchyma. No organized fluid collections. Spleen: Normal spleen. Adrenals/urinary tract: Adrenal glands and kidneys are normal. Stomach/Bowel: Stomach and limited of the small bowel is unremarkable Vascular/Lymphatic: Abdominal aortic normal caliber. No retroperitoneal periportal lymphadenopathy. Musculoskeletal: No aggressive osseous lesion IMPRESSION: 1. Findings consistent with gallstone pancreatitis. 2. Several small filling defects within distal common bile duct consistent with choledocholithiasis. Mild common bile duct dilatation. 3. Multiple gallstones layering in the lumen gallbladder of varying size. Mild gallbladder wall thickening and pericholecystic fluid. Recommend clinical correlation for acute cholecystitis. 4. Mild pancreatitis. No evidence of pancreatic necrosis. No organized fluid collections. These results will be called to the ordering clinician or representative by the Radiologist Assistant, and communication documented in the PACS or zVision Dashboard. Electronically Signed   By: Suzy Bouchard M.D.   On: 11/03/2017 10:29    EKG: 11/02/2017 Normal ECG  Sinus @ 59    Assessment and Plan:  Preoperative assessment  Thoracic aortic aneurysm  Sinus bradycardia  Gallstone pancreatitis  DVT/PE on chronic anticoagulation   The patient's surgical risk should be acceptable for both ERCP as well as cholecystectomy.  The change in her thoracic aortic aneurysm prompt more intense follow-up to see whether the change is really accelerating.  This need not impact surgical decision making.  Her bradycardia makes beta-blockers challenging.  Blood pressure targets previously outlined appropriate.  Will follow with you   Virl Axe

## 2017-11-03 NOTE — Progress Notes (Signed)
PROGRESS NOTE    GENNAVIEVE Cline  ZSW:[id number] DOB: 06-24-1940 DOA: 11/02/2017 PCP: Josetta Huddle, MD   Brief Narrative:  Denise Cline is a 77 y.o. female with medical history significant of peripheral vascular disease, thyroid nodules, mild COPD, thoracic aortic aneurysm disease (4.5 cm noted on CT scan in 2016 with CT scan in August 2019 showing no change), recent episodes of back pain shoulder pain and abdominal pain who presents the emergency department complaining of abdominal swelling and nausea and epigastric abdominal pain. Found to have Gallstone pancreatitis and choledocholithiasis with possible Acute Cholecystitis. Patient to undergo ERCP in AM and then have Cholecystectomy to follow and will defer timing of cholecystectomy to General Surgery.   Assessment & Plan:   Principal Problem:   Choledocholithiasis with acute cholecystitis with obstruction Active Problems:   Hypertension   Thyroid disease   PVD (peripheral vascular disease) (HCC)   COPD (chronic obstructive pulmonary disease) (HCC)   Thoracic aortic aneurysm (HCC)   Chronic kidney disease, stage 3 (HCC)  Choledocholithiasis with Acute Cholecystitis with Obstruction -Admitted to [facility] -Patient on clear liquid diets and n.p.o. at midnight for ERCP and possible cholecystectomy to follow over the following day pending surgeon availability -Cardiology consulted for preoperative evaluation and she has been cleared -Continue with IV fluid hydration -MRCP showed Findings consistent with gallstone pancreatitis. Several small filling defects within distal common bile duct consistent with choledocholithiasis. Mild common bile duct dilatation. Multiple gallstones layering in the lumen gallbladder of varying size. Mild gallbladder wall thickening and pericholecystic fluid. Recommend clinical correlation for acute cholecystitis. Mild pancreatitis. No evidence of pancreatic necrosis. No organized fluid collections. -Patient  will undergo ERCP in the a.m. as her Xarelto has been held for almost 3 days now -Patient started on IV Cefepime for possibly infected gallbladder given her current situation and will continue empirically -Lactated Ringer's bolus of 1 L and then started on continuous IV fluid hydration with lactated Ringer's at 125 mL/hr we will reduce rate to 100 and mils per hour for next 24 hours -Pain control with IV morphine 2 mg every 2 PRN for moderate pain, Robaxin 5 mg p.o. daily as needed muscle spasms -Continue with antiemetics with Zofran 4 mg p.o./IV every 6 as needed  Mild Gallstone Pancreatitis with Choledocholithiasis -MRCP Findings consistent with gallstone pancreatitis. Several small filling defects within distal common bile duct consistent with choledocholithiasis. Mild common bile duct dilatation. Multiple gallstones layering in the lumen gallbladder of varying size. Mild gallbladder wall thickening and pericholecystic fluid. Mild pancreatitis. No evidence of pancreatic necrosis. No organized fluid collections. -Lipase Level on Admission was 1701 and is now 105 -Treat with IV fluid hydration as above -Patient to undergo ERCP in the a.m and suspect will get a cholecystectomy after ERCP versus the following day -Follow-up on gastroenterology recommendations and hold IV heparin after midnight as a plan for ERCP tomorrow -Continue clear liquid diet for today and n.p.o. at midnight  Peripheral Vascular Disease -Noted  -Patient also with thoracic aortic aneurysm as below.  Thoracic Aortic Aneurysm - 4.5 cm and unchanged since 2016: Patient will continue with planned CT scan which is scheduled for 6 months from October 08, 2017. -Cardiology consulted for further evaluation recommendations  AKI on Chronic kidney disease stage III -Improved -Continue home losartan 100 mg p.o. daily but will discontinue hydrochlorothiazide as patient being rehydrated -Creatinine is at 1.7 on admission.  Is now  improved to 1.14 -Avoid nephrotoxic medications if possible and repeat CMP in the  a.m.  Hyperlipidemia -Recent lipid panel showed a cholesterol level of 226, HDL 71, LDL of 133, TG of 78 and VLDL of 16 -Continue rosuvastatin 20 mg p.o. nightly  Hypertension -BP was 143/76 -Continue home blood pressure medications but will hold Home HCTZ   Thyroid Nodules -Has history of surgical removal -We will be checking TSH.  COPD -Long history of asthma which she grew out of she does occasionally use an inhaler although she does not recognize a diagnosis of COPD.  History of DVT and Pulmonary Embolism -Patient is on Xarelto on a daily basis with last dose Thursday night -We will start a heparin drip  Sinus Bradycardia -Cardiology consulted and staying bradycardia makes beta-blocker challenging -Slightly bradycardic -Currently blood pressures at goal will follow  DVT prophylaxis: Anticoagulated with Heparin gtt Code Status: FULL CODE Family Communication: Discussed with family present at bedside Disposition Plan:   Consultants:   Gastroenterology  General Surgery  Cardiology    Procedures:  MRCP; ERCP to be done in AM   Antimicrobials:  Anti-infectives (From admission, onward)   Start     Dose/Rate Route Frequency Ordered Stop   11/02/17 2300  ceFEPIme (MAXIPIME) 1 g in sodium chloride 0.9 % 100 mL IVPB     1 g 200 mL/hr over 30 Minutes Intravenous Every 12 hours 11/02/17 1531     11/02/17 1130  ceFEPIme (MAXIPIME) 2 g in sodium chloride 0.9 % 100 mL IVPB     2 g 200 mL/hr over 30 Minutes Intravenous  Once 11/02/17 1128 11/02/17 1335     Subjective: Seen and examined at bedside after MRCP and states that her abdominal pain was not as intense.  Denies any chest pain, lightheadedness or dizziness.  Did not realize that she had gallstones.  Family was upset that this was not diagnosed earlier because of her previous scans for her abdominal aortic aneurysm.  No other  concerns or complaints at this time.  Objective: Vitals:   11/02/17 0915 11/02/17 1413 11/02/17 2100 11/03/17 0533  BP: (!) 119/97 (!) 129/91 127/72 (!) 143/76  Pulse:  (!) 52 (!) 57 (!) 59  Resp: 17  18 17   Temp:  98 F (36.7 C) 98.3 F (36.8 C) 98.4 F (36.9 C)  TempSrc:  Oral Oral Oral  SpO2:   100% 99%  Weight:  90.6 kg    Height:  5\' 6"  (1.676 m)      Intake/Output Summary (Last 24 hours) at 11/03/2017 1409 Last data filed at 11/03/2017 0737 Gross per 24 hour  Intake 1927.92 ml  Output -  Net 1927.92 ml   Filed Weights   11/02/17 1413  Weight: 90.6 kg   Examination: Physical Exam:  Constitutional: WN/WD obese AAF in NAD and appears calm but appears uncomfortable Eyes:  Lids and conjunctivae normal, sclerae anicteric  ENMT: External Ears, Nose appear normal. Grossly normal hearing.  Neck: Appears normal, supple, no cervical masses, normal ROM, no appreciable thyromegaly, no JVD Respiratory: Diminished to auscultation bilaterally, no wheezing, rales, rhonchi or crackles. Normal respiratory effort and patient is not tachypenic. No accessory muscle use.  Cardiovascular: Slightly Bradycardic, no murmurs / rubs / gallops. S1 and S2 auscultated. No extremity edema.  Abdomen: Soft, non-tender, non-distended. No masses palpated. No appreciable hepatosplenomegaly. Bowel sounds positive x4.  GU: Deferred. Musculoskeletal: No clubbing / cyanosis of digits/nails.  Good ROM, no contractures. Normal strength and muscle tone.  Skin: No rashes, lesions, ulcers on a limited skin evaluation. No induration; Warm and dry.  Neurologic: CN 2-12 grossly intact with no focal deficits. Romberg sign and cerebellar reflexes not assessed.  Psychiatric: Normal judgment and insight. Alert and oriented x 3. Normal mood and appropriate affect.   Data Reviewed: I have personally reviewed following labs and imaging studies  CBC: Recent Labs  Lab 11/02/17 0748 11/02/17 0802 11/03/17 0604  WBC 6.3   --  6.0  HGB 12.1 12.2 10.1*  HCT 37.5 36.0 32.1*  MCV 100.3*  --  102.2*  PLT 249  --  161   Basic Metabolic Panel: Recent Labs  Lab 11/02/17 0748 11/02/17 0802 11/03/17 0604  NA 140 140 142  K 3.4* 3.5 3.5  CL 103 100 103  CO2 27  --  25  GLUCOSE 111* 107* 88  BUN 28* 35* 18  CREATININE 1.64* 1.70* 1.14*  CALCIUM 8.8*  --  8.5*   GFR: Estimated Creatinine Clearance: 45.4 mL/min (A) (by C-G formula based on SCr of 1.14 mg/dL (H)). Liver Function Tests: Recent Labs  Lab 11/02/17 0748  AST 30  ALT 35  ALKPHOS 58  BILITOT 0.8  PROT 6.6  ALBUMIN 3.2*   Recent Labs  Lab 11/02/17 0748 11/03/17 0604  LIPASE 1,701* 105*   No results for input(s): AMMONIA in the last 168 hours. Coagulation Profile: No results for input(s): INR, PROTIME in the last 168 hours. Cardiac Enzymes: No results for input(s): CKTOTAL, CKMB, CKMBINDEX, TROPONINI in the last 168 hours. BNP (last 3 results) No results for input(s): PROBNP in the last 8760 hours. HbA1C: No results for input(s): HGBA1C in the last 72 hours. CBG: No results for input(s): GLUCAP in the last 168 hours. Lipid Profile: No results for input(s): CHOL, HDL, LDLCALC, TRIG, CHOLHDL, LDLDIRECT in the last 72 hours. Thyroid Function Tests: No results for input(s): TSH, T4TOTAL, FREET4, T3FREE, THYROIDAB in the last 72 hours. Anemia Panel: No results for input(s): VITAMINB12, FOLATE, FERRITIN, TIBC, IRON, RETICCTPCT in the last 72 hours. Sepsis Labs: Recent Labs  Lab 11/02/17 0803 11/02/17 1202  LATICACIDVEN 1.49 0.78    No results found for this or any previous visit (from the past 240 hour(s)).   Radiology Studies: Ct Abdomen Pelvis W Contrast  Result Date: 11/02/2017 CLINICAL DATA:  Abdominal pain and distention for the past 2 days. EXAM: CT ABDOMEN AND PELVIS WITH CONTRAST TECHNIQUE: Multidetector CT imaging of the abdomen and pelvis was performed using the standard protocol following bolus administration of  intravenous contrast. CONTRAST:  122mL ISOVUE-300 IOPAMIDOL (ISOVUE-300) INJECTION 61% COMPARISON:  CT abdomen pelvis dated October 08, 2017. FINDINGS: Lower chest: No acute abnormality. Hepatobiliary: No focal liver abnormality. Unchanged 7 mm small cyst versus hemangioma in the left hepatic lobe. New gallbladder distention with diffuse wall thickening. New mild common bile duct and intrahepatic biliary dilatation. Pancreas: New mild dilatation of the proximal main pancreatic duct with mild peripancreatic inflammatory changes. The pancreas enhances normally. No fluid collection. Spleen: Normal in size without focal abnormality. Adrenals/Urinary Tract: Adrenal glands are unremarkable. Kidneys are normal, without renal calculi, focal lesion, or hydronephrosis. Stable bilateral renal cysts. Bladder is unremarkable. Stomach/Bowel: Small hiatal hernia. Stomach is otherwise within normal limits. Appendix appears normal. No evidence of bowel wall thickening, distention, or inflammatory changes. Vascular/Lymphatic: Mild aortic atherosclerosis. No enlarged abdominal or pelvic lymph nodes. Reproductive: Status post hysterectomy. No adnexal masses. Other: No free fluid or pneumoperitoneum. Unchanged tiny fat containing umbilical hernia. Musculoskeletal: No acute or significant osseous findings. IMPRESSION: 1. New biliary dilatation and main pancreatic duct dilatation with peripancreatic  inflammatory changes. Findings are consistent with obstruction at the ampulla with pancreatitis, favored secondary to choledocholithiasis, although no discrete radiopaque gallstone is identified. Correlation with ERCP is recommended. 2. New gallbladder distention with prominent wall thickening, concerning for acute cholecystitis. 3.  Aortic atherosclerosis (ICD10-I70.0). Electronically Signed   By: Titus Dubin M.D.   On: 11/02/2017 10:14   Mr 3d Recon At Scanner  Result Date: 11/03/2017 CLINICAL DATA:  Pancreatitis acute pancreatitis.  EXAM: MRI ABDOMEN WITHOUT AND WITH CONTRAST (INCLUDING MRCP) TECHNIQUE: Multiplanar multisequence MR imaging of the abdomen was performed both before and after the administration of intravenous contrast. Heavily T2-weighted images of the biliary and pancreatic ducts were obtained, and three-dimensional MRCP images were rendered by post processing. CONTRAST:  74mL MULTIHANCE GADOBENATE DIMEGLUMINE 529 MG/ML IV SOLN COMPARISON:  CT 11/02/2017 FINDINGS: Lower chest:  Lung bases are clear. Hepatobiliary: No significant intrahepatic duct dilatation. No focal hepatic lesion. There are multiple gallstones layering dependently within the gallbladder ranging size from 2 mm to 8 mm. There approximately 24 stones. There is mild pericholecystic fluid and gallbladder wall thickening. The common bile duct is dilated to 8 mm (image 28/7). There is at least 1 distal stone in the common bile duct measuring approximately 2 mm on image 31/7). Additional stones or sludge or stones are evident on MRCP sequence image 1/series 14. Pancreas: No significant pancreatic duct dilatation. Small amount fluid along the body and tail the pancreas. The pancreatic parenchyma is normal signal intensity. Postcontrast imaging demonstrates uniform enhancement the pancreatic parenchyma. No organized fluid collections. Spleen: Normal spleen. Adrenals/urinary tract: Adrenal glands and kidneys are normal. Stomach/Bowel: Stomach and limited of the small bowel is unremarkable Vascular/Lymphatic: Abdominal aortic normal caliber. No retroperitoneal periportal lymphadenopathy. Musculoskeletal: No aggressive osseous lesion IMPRESSION: 1. Findings consistent with gallstone pancreatitis. 2. Several small filling defects within distal common bile duct consistent with choledocholithiasis. Mild common bile duct dilatation. 3. Multiple gallstones layering in the lumen gallbladder of varying size. Mild gallbladder wall thickening and pericholecystic fluid. Recommend  clinical correlation for acute cholecystitis. 4. Mild pancreatitis. No evidence of pancreatic necrosis. No organized fluid collections. These results will be called to the ordering clinician or representative by the Radiologist Assistant, and communication documented in the PACS or zVision Dashboard. Electronically Signed   By: Suzy Bouchard M.D.   On: 11/03/2017 10:29   Mr Abdomen Mrcp Moise Boring Contast  Result Date: 11/03/2017 CLINICAL DATA:  Pancreatitis acute pancreatitis. EXAM: MRI ABDOMEN WITHOUT AND WITH CONTRAST (INCLUDING MRCP) TECHNIQUE: Multiplanar multisequence MR imaging of the abdomen was performed both before and after the administration of intravenous contrast. Heavily T2-weighted images of the biliary and pancreatic ducts were obtained, and three-dimensional MRCP images were rendered by post processing. CONTRAST:  25mL MULTIHANCE GADOBENATE DIMEGLUMINE 529 MG/ML IV SOLN COMPARISON:  CT 11/02/2017 FINDINGS: Lower chest:  Lung bases are clear. Hepatobiliary: No significant intrahepatic duct dilatation. No focal hepatic lesion. There are multiple gallstones layering dependently within the gallbladder ranging size from 2 mm to 8 mm. There approximately 24 stones. There is mild pericholecystic fluid and gallbladder wall thickening. The common bile duct is dilated to 8 mm (image 28/7). There is at least 1 distal stone in the common bile duct measuring approximately 2 mm on image 31/7). Additional stones or sludge or stones are evident on MRCP sequence image 1/series 14. Pancreas: No significant pancreatic duct dilatation. Small amount fluid along the body and tail the pancreas. The pancreatic parenchyma is normal signal intensity. Postcontrast imaging demonstrates uniform  enhancement the pancreatic parenchyma. No organized fluid collections. Spleen: Normal spleen. Adrenals/urinary tract: Adrenal glands and kidneys are normal. Stomach/Bowel: Stomach and limited of the small bowel is unremarkable  Vascular/Lymphatic: Abdominal aortic normal caliber. No retroperitoneal periportal lymphadenopathy. Musculoskeletal: No aggressive osseous lesion IMPRESSION: 1. Findings consistent with gallstone pancreatitis. 2. Several small filling defects within distal common bile duct consistent with choledocholithiasis. Mild common bile duct dilatation. 3. Multiple gallstones layering in the lumen gallbladder of varying size. Mild gallbladder wall thickening and pericholecystic fluid. Recommend clinical correlation for acute cholecystitis. 4. Mild pancreatitis. No evidence of pancreatic necrosis. No organized fluid collections. These results will be called to the ordering clinician or representative by the Radiologist Assistant, and communication documented in the PACS or zVision Dashboard. Electronically Signed   By: Suzy Bouchard M.D.   On: 11/03/2017 10:29   Scheduled Meds: . [START ON 11/04/2017] aspirin EC  81 mg Oral Q M,W,F  . buPROPion  150 mg Oral Daily  . calcium-vitamin D  1 tablet Oral Q breakfast  . losartan  100 mg Oral Daily   And  . hydrochlorothiazide  25 mg Oral Daily  . multivitamin with minerals  1 tablet Oral Daily  . pantoprazole  40 mg Oral Daily  . polyvinyl alcohol  1 drop Both Eyes TID  . rosuvastatin  20 mg Oral Daily   Continuous Infusions: . ceFEPime (MAXIPIME) IV 1 g (11/03/17 1046)  . heparin 1,100 Units/hr (11/03/17 1130)  . lactated ringers 125 mL/hr at 11/03/17 1042    LOS: 1 day    Kerney Elbe, DO [facility] PAGER is on [place]  If 7PM-7AM, please contact night-coverage [email or web address] Password [email or web address] 11/03/2017, 2:09 PM

## 2017-11-03 NOTE — Progress Notes (Signed)
Subjective: The patient was seen and examined at bedside. She reports improvement in abdominal pain and is requesting for her diet to be advanced.  Objective: Vital signs in last 24 hours: Temp:  [98 F (36.7 C)-98.4 F (36.9 C)] 98.4 F (36.9 C) (09/01 0533) Pulse Rate:  [52-59] 59 (09/01 0533) Resp:  [17-18] 17 (09/01 0533) BP: (127-143)/(72-91) 143/76 (09/01 0533) SpO2:  [99 %-100 %] 99 % (09/01 0533) Weight:  [90.6 kg] 90.6 kg (08/31 1413) Weight change:     WI:OMBTDHRC,BULA pallor, no obvious icterus GENERAL:sitting on bed, appears comfortable ABDOMEN:soft, nondistended, nontender, normoactive bowel sounds EXTREMITIES:no edema  Lab Results: Results for orders placed or performed during the hospital encounter of 11/02/17 (from the past 48 hour(s))  Lipase, blood     Status: Abnormal   Collection Time: 11/02/17  7:48 AM  Result Value Ref Range   Lipase 1,701 (H) 11 - 51 U/L    Comment: RESULTS CONFIRMED BY MANUAL DILUTION Performed at [facility], [address], [place], [place] [address]   Comprehensive metabolic panel     Status: Abnormal   Collection Time: 11/02/17  7:48 AM  Result Value Ref Range   Sodium 140 135 - 145 mmol/L   Potassium 3.4 (L) 3.5 - 5.1 mmol/L   Chloride 103 98 - 111 mmol/L   CO2 27 22 - 32 mmol/L   Glucose, Bld 111 (H) 70 - 99 mg/dL   BUN 28 (H) 8 - 23 mg/dL   Creatinine, Ser 1.64 (H) 0.44 - 1.00 mg/dL   Calcium 8.8 (L) 8.9 - 10.3 mg/dL   Total Protein 6.6 6.5 - 8.1 g/dL   Albumin 3.2 (L) 3.5 - 5.0 g/dL   AST 30 15 - 41 U/L   ALT 35 0 - 44 U/L   Alkaline Phosphatase 58 38 - 126 U/L   Total Bilirubin 0.8 0.3 - 1.2 mg/dL   GFR calc non Af Amer 29 (L) >60 mL/min   GFR calc Af Amer 33 (L) >60 mL/min    Comment: (NOTE) The eGFR has been calculated using the CKD EPI equation. This calculation has not been validated in all clinical situations. eGFR's persistently <60 mL/min signify possible Chronic Kidney Disease.    Anion gap 10 5  - 15    Comment: Performed at [facility] [address] [place] [address]  CBC     Status: Abnormal   Collection Time: 11/02/17  7:48 AM  Result Value Ref Range   WBC 6.3 4.0 - 10.5 K/uL   RBC 3.74 (L) 3.87 - 5.11 MIL/uL   Hemoglobin 12.1 12.0 - 15.0 g/dL   HCT 37.5 36.0 - 46.0 %   MCV 100.3 (H) 78.0 - 100.0 fL   MCH 32.4 26.0 - 34.0 pg   MCHC 32.3 30.0 - 36.0 g/dL   RDW 13.5 11.5 - 15.5 %   Platelets 249 150 - 400 K/uL    Comment: Performed at [facility], [place] [address] [place], [place] [address]  I-stat troponin, ED     Status: None   Collection Time: 11/02/17  8:01 AM  Result Value Ref Range   Troponin i, poc 0.01 0.00 - 0.08 ng/mL   Comment 3            Comment: Due to the release kinetics of cTnI, a negative result within the first hours of the onset of symptoms does not rule out myocardial infarction with certainty. If myocardial infarction is  still suspected, repeat the test at appropriate intervals.   I-Stat Chem 8, ED     Status: Abnormal   Collection Time: 11/02/17  8:02 AM  Result Value Ref Range   Sodium 140 135 - 145 mmol/L   Potassium 3.5 3.5 - 5.1 mmol/L   Chloride 100 98 - 111 mmol/L   BUN 35 (H) 8 - 23 mg/dL   Creatinine, Ser 1.70 (H) 0.44 - 1.00 mg/dL   Glucose, Bld 107 (H) 70 - 99 mg/dL   Calcium, Ion 1.11 (L) 1.15 - 1.40 mmol/L   TCO2 30 22 - 32 mmol/L   Hemoglobin 12.2 12.0 - 15.0 g/dL   HCT 36.0 36.0 - 46.0 %  I-Stat CG4 Lactic Acid, ED     Status: None   Collection Time: 11/02/17  8:03 AM  Result Value Ref Range   Lactic Acid, Venous 1.49 0.5 - 1.9 mmol/L  Urinalysis, Routine w reflex microscopic     Status: Abnormal   Collection Time: 11/02/17  8:54 AM  Result Value Ref Range   Color, Urine YELLOW YELLOW   APPearance HAZY (A) CLEAR   Specific Gravity, Urine 1.011 1.005 - 1.030   pH 5.0 5.0 - 8.0   Glucose, UA NEGATIVE NEGATIVE mg/dL   Hgb urine dipstick NEGATIVE NEGATIVE   Bilirubin Urine NEGATIVE NEGATIVE    Ketones, ur NEGATIVE NEGATIVE mg/dL   Protein, ur NEGATIVE NEGATIVE mg/dL   Nitrite NEGATIVE NEGATIVE   Leukocytes, UA SMALL (A) NEGATIVE   RBC / HPF 0-5 0 - 5 RBC/hpf   WBC, UA 6-10 0 - 5 WBC/hpf   Bacteria, UA NONE SEEN NONE SEEN   Squamous Epithelial / LPF 6-10 0 - 5   Mucus PRESENT     Comment: Performed at [facility], [address] [place], [place] [address]  I-Stat CG4 Lactic Acid, ED     Status: None   Collection Time: 11/02/17 12:02 PM  Result Value Ref Range   Lactic Acid, Venous 0.78 0.5 - 1.9 mmol/L  Heparin level (unfractionated)     Status: None   Collection Time: 11/02/17  3:01 PM  Result Value Ref Range   Heparin Unfractionated 0.51 0.30 - 0.70 IU/mL    Comment: (NOTE) If heparin results are below expected values, and patient dosage has  been confirmed, suggest follow up testing of antithrombin III levels. Performed at [facility], [place] [address], [place], [place] [address]   APTT     Status: None   Collection Time: 11/02/17  3:01 PM  Result Value Ref Range   aPTT 35 24 - 36 seconds    Comment: Performed at [facility] [address], [place], [place] [address]  APTT     Status: Abnormal   Collection Time: 11/02/17 10:48 PM  Result Value Ref Range   aPTT 89 (H) 24 - 36 seconds    Comment:        IF BASELINE aPTT IS ELEVATED, SUGGEST PATIENT RISK ASSESSMENT BE USED TO DETERMINE APPROPRIATE ANTICOAGULANT THERAPY. Performed at [facility], [place] [address] [place], [place] [address]   Heparin level (unfractionated)     Status: Abnormal   Collection Time: 11/02/17 10:48 PM  Result Value Ref Range   Heparin Unfractionated 0.80 (H) 0.30 - 0.70 IU/mL    Comment: (NOTE) If heparin results are below expected values, and patient dosage has  been confirmed, suggest follow up testing of antithrombin III levels. Performed at [facility], [place] [address], [place], [place] [address]   Basic metabolic panel     Status: Abnormal    Collection Time: 11/03/17  6:04 AM  Result Value Ref Range   Sodium 142 135 - 145 mmol/L   Potassium 3.5 3.5 - 5.1 mmol/L   Chloride 103 98 - 111 mmol/L   CO2 25 22 - 32 mmol/L   Glucose, Bld 88 70 - 99 mg/dL   BUN 18 8 - 23 mg/dL   Creatinine, Ser 1.14 (H) 0.44 - 1.00 mg/dL   Calcium 8.5 (L) 8.9 - 10.3 mg/dL   GFR calc non Af Amer 45 (L) >60 mL/min   GFR calc Af Amer 52 (L) >60 mL/min    Comment: (NOTE) The eGFR has been calculated using the CKD EPI equation. This calculation has not been validated in all clinical situations. eGFR's persistently <60 mL/min signify possible Chronic Kidney Disease.    Anion gap 14 5 - 15    Comment: Performed at [facility] [address], [place], [place] [address]  CBC     Status: Abnormal   Collection Time: 11/03/17  6:04 AM  Result Value Ref Range   WBC 6.0 4.0 - 10.5 K/uL   RBC 3.14 (L) 3.87 - 5.11 MIL/uL   Hemoglobin 10.1 (L) 12.0 - 15.0 g/dL   HCT 32.1 (L) 36.0 - 46.0 %   MCV 102.2 (H) 78.0 - 100.0 fL   MCH 32.2 26.0 - 34.0 pg   MCHC 31.5 30.0 - 36.0 g/dL   RDW 13.6 11.5 - 15.5 %   Platelets 257 150 - 400 K/uL    Comment: Performed at [facility], [place] [address] [place], [place] [address]  Lipase, blood     Status: Abnormal   Collection Time: 11/03/17  6:04 AM  Result Value Ref Range   Lipase 105 (H) 11 - 51 U/L    Comment: Performed at [facility] [address] [place], [place] [address]  Heparin level (unfractionated)     Status: Abnormal   Collection Time: 11/03/17  6:04 AM  Result Value Ref Range   Heparin Unfractionated 0.77 (H) 0.30 - 0.70 IU/mL    Comment: (NOTE) If heparin results are below expected values, and patient dosage has  been confirmed, suggest follow up testing of antithrombin III levels. Performed at [facility], [place] [address] [place], [place] [address]   APTT     Status: Abnormal   Collection Time: 11/03/17  6:04 AM  Result Value Ref Range   aPTT 106 (H) 24 - 36 seconds     Comment:        IF BASELINE aPTT IS ELEVATED, SUGGEST PATIENT RISK ASSESSMENT BE USED TO DETERMINE APPROPRIATE ANTICOAGULANT THERAPY. Performed at [facility], [place] [address], [place], [place] [address]     Studies/Results: Ct Abdomen Pelvis W Contrast  Result Date: 11/02/2017 CLINICAL DATA:  Abdominal pain and distention for the past 2 days. EXAM: CT ABDOMEN AND PELVIS WITH CONTRAST TECHNIQUE: Multidetector CT imaging of the abdomen and pelvis was performed using the standard protocol following bolus administration of intravenous contrast. CONTRAST:  128m ISOVUE-300 IOPAMIDOL (ISOVUE-300) INJECTION 61% COMPARISON:  CT abdomen pelvis dated October 08, 2017. FINDINGS: Lower chest: No acute abnormality. Hepatobiliary: No focal liver abnormality. Unchanged 7 mm small cyst versus hemangioma in the left hepatic lobe. New gallbladder distention with diffuse wall thickening. New mild common bile duct and intrahepatic biliary dilatation.  Pancreas: New mild dilatation of the proximal main pancreatic duct with mild peripancreatic inflammatory changes. The pancreas enhances normally. No fluid collection. Spleen: Normal in size without focal abnormality. Adrenals/Urinary Tract: Adrenal glands are unremarkable. Kidneys are normal, without renal calculi, focal lesion, or hydronephrosis. Stable bilateral renal cysts. Bladder is unremarkable. Stomach/Bowel: Small hiatal hernia. Stomach is otherwise within normal limits. Appendix appears normal. No evidence of bowel wall thickening, distention, or inflammatory changes. Vascular/Lymphatic: Mild aortic atherosclerosis. No enlarged abdominal or pelvic lymph nodes. Reproductive: Status post hysterectomy. No adnexal masses. Other: No free fluid or pneumoperitoneum. Unchanged tiny fat containing umbilical hernia. Musculoskeletal: No acute or significant osseous findings. IMPRESSION: 1. New biliary dilatation and main pancreatic duct dilatation with peripancreatic  inflammatory changes. Findings are consistent with obstruction at the ampulla with pancreatitis, favored secondary to choledocholithiasis, although no discrete radiopaque gallstone is identified. Correlation with ERCP is recommended. 2. New gallbladder distention with prominent wall thickening, concerning for acute cholecystitis. 3.  Aortic atherosclerosis (ICD10-I70.0). Electronically Signed   By: Titus Dubin M.D.   On: 11/02/2017 10:14   Mr 3d Recon At Scanner  Result Date: 11/03/2017 CLINICAL DATA:  Pancreatitis acute pancreatitis. EXAM: MRI ABDOMEN WITHOUT AND WITH CONTRAST (INCLUDING MRCP) TECHNIQUE: Multiplanar multisequence MR imaging of the abdomen was performed both before and after the administration of intravenous contrast. Heavily T2-weighted images of the biliary and pancreatic ducts were obtained, and three-dimensional MRCP images were rendered by post processing. CONTRAST:  26m MULTIHANCE GADOBENATE DIMEGLUMINE 529 MG/ML IV SOLN COMPARISON:  CT 11/02/2017 FINDINGS: Lower chest:  Lung bases are clear. Hepatobiliary: No significant intrahepatic duct dilatation. No focal hepatic lesion. There are multiple gallstones layering dependently within the gallbladder ranging size from 2 mm to 8 mm. There approximately 24 stones. There is mild pericholecystic fluid and gallbladder wall thickening. The common bile duct is dilated to 8 mm (image 28/7). There is at least 1 distal stone in the common bile duct measuring approximately 2 mm on image 31/7). Additional stones or sludge or stones are evident on MRCP sequence image 1/series 14. Pancreas: No significant pancreatic duct dilatation. Small amount fluid along the body and tail the pancreas. The pancreatic parenchyma is normal signal intensity. Postcontrast imaging demonstrates uniform enhancement the pancreatic parenchyma. No organized fluid collections. Spleen: Normal spleen. Adrenals/urinary tract: Adrenal glands and kidneys are normal. Stomach/Bowel:  Stomach and limited of the small bowel is unremarkable Vascular/Lymphatic: Abdominal aortic normal caliber. No retroperitoneal periportal lymphadenopathy. Musculoskeletal: No aggressive osseous lesion IMPRESSION: 1. Findings consistent with gallstone pancreatitis. 2. Several small filling defects within distal common bile duct consistent with choledocholithiasis. Mild common bile duct dilatation. 3. Multiple gallstones layering in the lumen gallbladder of varying size. Mild gallbladder wall thickening and pericholecystic fluid. Recommend clinical correlation for acute cholecystitis. 4. Mild pancreatitis. No evidence of pancreatic necrosis. No organized fluid collections. These results will be called to the ordering clinician or representative by the Radiologist Assistant, and communication documented in the PACS or zVision Dashboard. Electronically Signed   By: SSuzy BouchardM.D.   On: 11/03/2017 10:29   Mr Abdomen Mrcp WMoise BoringContast  Result Date: 11/03/2017 CLINICAL DATA:  Pancreatitis acute pancreatitis. EXAM: MRI ABDOMEN WITHOUT AND WITH CONTRAST (INCLUDING MRCP) TECHNIQUE: Multiplanar multisequence MR imaging of the abdomen was performed both before and after the administration of intravenous contrast. Heavily T2-weighted images of the biliary and pancreatic ducts were obtained, and three-dimensional MRCP images were rendered by post processing. CONTRAST:  255mMULTIHANCE GADOBENATE DIMEGLUMINE 529 MG/ML IV  SOLN COMPARISON:  CT 11/02/2017 FINDINGS: Lower chest:  Lung bases are clear. Hepatobiliary: No significant intrahepatic duct dilatation. No focal hepatic lesion. There are multiple gallstones layering dependently within the gallbladder ranging size from 2 mm to 8 mm. There approximately 24 stones. There is mild pericholecystic fluid and gallbladder wall thickening. The common bile duct is dilated to 8 mm (image 28/7). There is at least 1 distal stone in the common bile duct measuring approximately 2 mm  on image 31/7). Additional stones or sludge or stones are evident on MRCP sequence image 1/series 14. Pancreas: No significant pancreatic duct dilatation. Small amount fluid along the body and tail the pancreas. The pancreatic parenchyma is normal signal intensity. Postcontrast imaging demonstrates uniform enhancement the pancreatic parenchyma. No organized fluid collections. Spleen: Normal spleen. Adrenals/urinary tract: Adrenal glands and kidneys are normal. Stomach/Bowel: Stomach and limited of the small bowel is unremarkable Vascular/Lymphatic: Abdominal aortic normal caliber. No retroperitoneal periportal lymphadenopathy. Musculoskeletal: No aggressive osseous lesion IMPRESSION: 1. Findings consistent with gallstone pancreatitis. 2. Several small filling defects within distal common bile duct consistent with choledocholithiasis. Mild common bile duct dilatation. 3. Multiple gallstones layering in the lumen gallbladder of varying size. Mild gallbladder wall thickening and pericholecystic fluid. Recommend clinical correlation for acute cholecystitis. 4. Mild pancreatitis. No evidence of pancreatic necrosis. No organized fluid collections. These results will be called to the ordering clinician or representative by the Radiologist Assistant, and communication documented in the PACS or zVision Dashboard. Electronically Signed   By: Suzy Bouchard M.D.   On: 11/03/2017 10:29    Medications: I have reviewed the patient's current medications.  Assessment: 1.gallstone pancreatitis-No organized fluid collections, no necrosis 2. CBD of 8 mm with distal CBD stones 3.mild gallbladder wall thickening and pericholecystic fluid compatible with acute cholecystitis 4.renal impairment-improvement in GFR and creatinine   Plan: 1.Clear liquid diet today Patient states last dose of Xarelto was Thursday evening. Plan ERCP tomorrow, plan holding IV heparin after midnight. Clear liquid diet today and nothing by mouth  post midnight. The risks(infection, bleeding, perforation, pancreatitis and even death) and benefits of the procedure were discussed with the patient in details. She understands and verbalizes consent.  2.4.5 cm ascending thoracic aortic aneurysm with atherosclerotic calcifications noted on CT Angio from 10/08/17. Recommend preoperative cardiology clearance for ERCP and cholecystectomy. Discussed with patient's hospitalist Dr. Alfredia Ferguson.  3. Surgical consult for evaluation for cholecystectomy  Ronnette Juniper 11/03/2017, 11:29 AM   Pager [phone] If no answer or after 5 PM call [phone]

## 2017-11-03 NOTE — Progress Notes (Signed)
[facility] for Heparin  Indication: pulmonary embolus   Allergies  Allergen Reactions  . Penicillins Anaphylaxis    Has patient had a PCN reaction causing immediate rash, facial/tongue/throat swelling, SOB or lightheadedness with hypotension: yes Has patient had a PCN reaction causing severe rash involving mucus membranes or skin necrosis: no Has patient had a PCN reaction that required hospitalization yes Has patient had a PCN reaction occurring within the last 10 years: no If all of the above answers are "NO", then may proceed with Cephalosporin use.   . Sulfa Antibiotics Hives  . Oxycodone Other (See Comments)    hallucanations     Patient Measurements: Height: 5\' 6"  (167.6 cm) Weight: 199 lb 11.8 oz (90.6 kg) IBW/kg (Calculated) : 59.3 Heparin Dosing Weight: 81 kg  Vital Signs: Temp: 98.4 F (36.9 C) (09/01 0533) Temp Source: Oral (09/01 0533) BP: 143/76 (09/01 0533) Pulse Rate: 59 (09/01 0533)  Labs: Recent Labs    11/02/17 0748 11/02/17 0802 11/02/17 1501 11/02/17 2248 11/03/17 0604  HGB 12.1 12.2  --   --  10.1*  HCT 37.5 36.0  --   --  32.1*  PLT 249  --   --   --  257  APTT  --   --  35 89* 106*  HEPARINUNFRC  --   --  0.51 0.80* 0.77*  CREATININE 1.64* 1.70*  --   --  1.14*    Estimated Creatinine Clearance: 45.4 mL/min (A) (by C-G formula based on SCr of 1.14 mg/dL (H)).  Assessment: 77 year old female with h/o PE, Xarelto on hold, for heparin.  Heparin level elevated in setting of recent Xarelto (last dose 8/29), however starting to correlate as APTT also slightly elevated for goal at 106 on rate of 1200 units/hr. H/H down some this am, platelets are stable. No overt bleeding reported. MRI of abdomen today consistent with gallstone pancreatitis. SCr is improving at 1.14 with CrCl ~45 mL/min.   Goal of Therapy:  Heparin level 0.3-0.7 units/ml Monitor platelets by anticoagulation protocol: Yes  PTT = 66 to 102  seconds   Plan:  Reduce Heparin to 1100 units/hr.  Follow-up am labs.  May be able to go by heparin level starting tomorrow.   Sloan Leiter, PharmD, BCPS, BCCCP Clinical Pharmacist Clinical phone 11/03/2017 until 3:30PM [phone] Please refer to [facility] for [place] numbers  11/03/2017,11:01 AM

## 2017-11-04 ENCOUNTER — Inpatient Hospital Stay (HOSPITAL_COMMUNITY): Payer: Medicare HMO | Admitting: Certified Registered Nurse Anesthetist

## 2017-11-04 ENCOUNTER — Inpatient Hospital Stay (HOSPITAL_COMMUNITY): Payer: Medicare HMO

## 2017-11-04 ENCOUNTER — Encounter (HOSPITAL_COMMUNITY): Payer: Self-pay | Admitting: Gastroenterology

## 2017-11-04 ENCOUNTER — Encounter (HOSPITAL_COMMUNITY)
Admission: EM | Disposition: A | Discharge status: Home / Self Care | Payer: Self-pay | Source: Home / Self Care | Attending: Internal Medicine

## 2017-11-04 HISTORY — PX: ERCP: SHX5425

## 2017-11-04 HISTORY — PX: SPHINCTEROTOMY: SHX5544

## 2017-11-04 LAB — COMPREHENSIVE METABOLIC PANEL
ALBUMIN: 2.8 g/dL — AB (ref 3.5–5.0)
ALK PHOS: 53 U/L (ref 38–126)
ALT: 27 U/L (ref 0–44)
AST: 21 U/L (ref 15–41)
Anion gap: 9 (ref 5–15)
BILIRUBIN TOTAL: 0.7 mg/dL (ref 0.3–1.2)
BUN: 10 mg/dL (ref 8–23)
CO2: 30 mmol/L (ref 22–32)
Calcium: 8.8 mg/dL — ABNORMAL LOW (ref 8.9–10.3)
Chloride: 102 mmol/L (ref 98–111)
Creatinine, Ser: 1.06 mg/dL — ABNORMAL HIGH (ref 0.44–1.00)
GFR calc Af Amer: 56 mL/min — ABNORMAL LOW (ref >60)
GFR calc non Af Amer: 49 mL/min — ABNORMAL LOW (ref >60)
GLUCOSE: 96 mg/dL (ref 70–99)
POTASSIUM: 3 mmol/L — AB (ref 3.5–5.1)
SODIUM: 141 mmol/L (ref 135–145)
Total Protein: 6.4 g/dL — ABNORMAL LOW (ref 6.5–8.1)

## 2017-11-04 LAB — CBC
HEMATOCRIT: 32.1 % — AB (ref 36.0–46.0)
HEMOGLOBIN: 10.7 g/dL — AB (ref 12.0–15.0)
MCH: 32 pg (ref 26.0–34.0)
MCHC: 33.3 g/dL (ref 30.0–36.0)
MCV: 96.1 fL (ref 78.0–100.0)
Platelets: 275 10*3/uL (ref 150–400)
RBC: 3.34 MIL/uL — ABNORMAL LOW (ref 3.87–5.11)
RDW: 12.8 % (ref 11.5–15.5)
WBC: 4.6 10*3/uL (ref 4.0–10.5)

## 2017-11-04 LAB — HEPARIN LEVEL (UNFRACTIONATED)
Heparin Unfractionated: <0.1 IU/mL — ABNORMAL LOW (ref 0.30–0.70)
Heparin Unfractionated: 0.54 IU/mL (ref 0.30–0.70)

## 2017-11-04 LAB — APTT: APTT: 36 s (ref 24–36)

## 2017-11-04 LAB — PHOSPHORUS: Phosphorus: 3.6 mg/dL (ref 2.5–4.6)

## 2017-11-04 LAB — MAGNESIUM: Magnesium: 1.2 mg/dL — ABNORMAL LOW (ref 1.7–2.4)

## 2017-11-04 LAB — LIPASE, BLOOD: Lipase: 47 U/L (ref 11–51)

## 2017-11-04 SURGERY — ERCP, WITH INTERVENTION IF INDICATED
Anesthesia: General

## 2017-11-04 MED ORDER — SUGAMMADEX SODIUM 200 MG/2ML IV SOLN
INTRAVENOUS | Status: DC | PRN
  Administered 2017-11-04: 200 mg via INTRAVENOUS

## 2017-11-04 MED ORDER — IOPAMIDOL (ISOVUE-300) INJECTION 61%
INTRAVENOUS | Status: AC
  Filled 2017-11-04: qty 100

## 2017-11-04 MED ORDER — LIDOCAINE 2% (20 MG/ML) 5 ML SYRINGE
INTRAMUSCULAR | Status: DC | PRN
  Administered 2017-11-04: 100 mg via INTRAVENOUS

## 2017-11-04 MED ORDER — FENTANYL CITRATE (PF) 100 MCG/2ML IJ SOLN
INTRAMUSCULAR | Status: AC
  Filled 2017-11-04: qty 2

## 2017-11-04 MED ORDER — FENTANYL CITRATE (PF) 250 MCG/5ML IJ SOLN
INTRAMUSCULAR | Status: DC | PRN
  Administered 2017-11-04: 25 ug via INTRAVENOUS
  Administered 2017-11-04: 75 ug via INTRAVENOUS

## 2017-11-04 MED ORDER — MAGNESIUM SULFATE 50 % IJ SOLN
3.0000 g | Freq: Once | INTRAVENOUS | Status: AC
  Administered 2017-11-04: 3 g via INTRAVENOUS
  Filled 2017-11-04: qty 6

## 2017-11-04 MED ORDER — ONDANSETRON HCL 4 MG/2ML IJ SOLN
INTRAMUSCULAR | Status: DC | PRN
  Administered 2017-11-04: 4 mg via INTRAVENOUS

## 2017-11-04 MED ORDER — SODIUM CHLORIDE 0.9 % IV SOLN
INTRAVENOUS | Status: DC | PRN
  Administered 2017-11-04: 20 mL

## 2017-11-04 MED ORDER — HEPARIN (PORCINE) IN NACL 100-0.45 UNIT/ML-% IJ SOLN
1100.0000 [IU]/h | INTRAMUSCULAR | Status: DC
  Administered 2017-11-04: 1100 [IU]/h via INTRAVENOUS
  Filled 2017-11-04 (×2): qty 250

## 2017-11-04 MED ORDER — ROCURONIUM BROMIDE 10 MG/ML (PF) SYRINGE
PREFILLED_SYRINGE | INTRAVENOUS | Status: DC | PRN
  Administered 2017-11-04: 50 mg via INTRAVENOUS

## 2017-11-04 MED ORDER — EPHEDRINE SULFATE-NACL 50-0.9 MG/10ML-% IV SOSY
PREFILLED_SYRINGE | INTRAVENOUS | Status: DC | PRN
  Administered 2017-11-04: 10 mg via INTRAVENOUS
  Administered 2017-11-04: 5 mg via INTRAVENOUS

## 2017-11-04 MED ORDER — INDOMETHACIN 50 MG RE SUPP
RECTAL | Status: AC
  Filled 2017-11-04: qty 2

## 2017-11-04 MED ORDER — GLUCAGON HCL RDNA (DIAGNOSTIC) 1 MG IJ SOLR
INTRAMUSCULAR | Status: AC
  Filled 2017-11-04: qty 1

## 2017-11-04 MED ORDER — DEXAMETHASONE SODIUM PHOSPHATE 10 MG/ML IJ SOLN
INTRAMUSCULAR | Status: DC | PRN
  Administered 2017-11-04: 10 mg via INTRAVENOUS

## 2017-11-04 MED ORDER — POTASSIUM CHLORIDE 10 MEQ/100ML IV SOLN
10.0000 meq | INTRAVENOUS | Status: DC
  Administered 2017-11-04 (×2): 10 meq via INTRAVENOUS
  Filled 2017-11-04 (×3): qty 100

## 2017-11-04 MED ORDER — PROPOFOL 10 MG/ML IV BOLUS
INTRAVENOUS | Status: DC | PRN
  Administered 2017-11-04: 120 mg via INTRAVENOUS

## 2017-11-04 MED ORDER — LACTATED RINGERS IV SOLN
INTRAVENOUS | Status: DC | PRN
  Administered 2017-11-04: 12:00:00 via INTRAVENOUS

## 2017-11-04 MED ORDER — PHENYLEPHRINE 40 MCG/ML (10ML) SYRINGE FOR IV PUSH (FOR BLOOD PRESSURE SUPPORT)
PREFILLED_SYRINGE | INTRAVENOUS | Status: DC | PRN
  Administered 2017-11-04: 80 ug via INTRAVENOUS
  Administered 2017-11-04: 40 ug via INTRAVENOUS

## 2017-11-04 MED ORDER — LACTATED RINGERS IV SOLN
INTRAVENOUS | Status: AC
  Administered 2017-11-05 (×2): via INTRAVENOUS

## 2017-11-04 MED ORDER — POTASSIUM CHLORIDE 10 MEQ/100ML IV SOLN
10.0000 meq | INTRAVENOUS | Status: AC
  Administered 2017-11-04 (×2): 10 meq via INTRAVENOUS
  Filled 2017-11-04: qty 100

## 2017-11-04 MED ORDER — SODIUM CHLORIDE 0.9 % IV SOLN: INTRAVENOUS | Status: DC

## 2017-11-04 NOTE — Progress Notes (Signed)
PROGRESS NOTE    DAMARA KLUNDER  PYK:[id number] DOB: [birth year] DOA: 11/02/2017 PCP: Josetta Huddle, MD   Brief Narrative:  Denise Cline is a 77 y.o. female with medical history significant of peripheral vascular disease, thyroid nodules, mild COPD, thoracic aortic aneurysm disease (4.5 cm noted on CT scan in 2016 with CT scan in August 2019 showing no change), recent episodes of back pain shoulder pain and abdominal pain who presents the emergency department complaining of abdominal swelling and nausea and epigastric abdominal pain. Found to have Gallstone pancreatitis and choledocholithiasis with possible Acute Cholecystitis. Patient underwent ERCP this AM and then have Cholecystectomy tomorrow per General Surgery.   Assessment & Plan:   Principal Problem:   Choledocholithiasis with acute cholecystitis with obstruction Active Problems:   Hypertension   Thyroid disease   PVD (peripheral vascular disease) (HCC)   COPD (chronic obstructive pulmonary disease) (HCC)   Thoracic aortic aneurysm (HCC)   Chronic kidney disease, stage 3 (HCC)  Choledocholithiasis with Acute Cholecystitis with Obstruction with likley passed stones  -Admitted to [facility] -Patient on clear liquid diets and n.p.o. at midnight for ERCP and possible cholecystectomy to follow over the following day pending surgeon availability -Cardiology consulted for preoperative evaluation and she has been cleared -Continue with IV fluid hydration -MRCP showed Findings consistent with gallstone pancreatitis. Several small filling defects within distal common bile duct consistent with choledocholithiasis. Mild common bile duct dilatation. Multiple gallstones layering in the lumen gallbladder of varying size. Mild gallbladder wall thickening and pericholecystic fluid. Recommend clinical correlation for acute cholecystitis. Mild pancreatitis. No evidence of pancreatic necrosis. No organized fluid collections. -Patient underwent  ERCP this AM and Cholangiogram did not reveal obvious filling defects and a sphincterotomy was preformed and CBD was swept with a balloon and nothing was able to be retrieved indicating likely passage of CBD stones.  -Patient started on IV Cefepime for possibly infected gallbladder given her current situation and will continue empirically for now  -Lactated Ringer's bolus of 1 L and then started on continuous IV fluid hydration with lactated Ringer's at 125 mL/hr we will reduce rate to 100 and mils per hour for next 24 hours and will resume IVF with LR at 50 mL/hr -Pain control with IV morphine 2 mg every 2 PRN for moderate pain, Robaxin 5 mg p.o. daily as needed muscle spasms -Continue with Antiemetics with Zofran 4 mg p.o./IV every 6 as needed -Full Liquid Diet Today and NPO at midnight -Resume Heparin gtt and hold prior to Surgery  Mild Gallstone Pancreatitis with Choledocholithiasis -MRCP Findings consistent with gallstone pancreatitis. Several small filling defects within distal common bile duct consistent with choledocholithiasis. Mild common bile duct dilatation. Multiple gallstones layering in the lumen gallbladder of varying size. Mild gallbladder wall thickening and pericholecystic fluid. Mild pancreatitis. No evidence of pancreatic necrosis. No organized fluid collections. -Lipase Level on Admission was 1701 and is now normal at 47 -Treat with IV fluid hydration as above -Patient underwent ERCP this AM and as above nothing was retrieved likely indicating passage of stones -Lipase now normalized  -Follow-up on gastroenterology recommendations and resume IV heparin now and hold again for cholecytectomy -Continue Full liquid diet for today and n.p.o. at midnight for cholecystectomy  Peripheral Vascular Disease -Noted  -Patient also with thoracic aortic aneurysm as below.  Thoracic Aortic Aneurysm - 4.5 cm and unchanged since 2016: Patient will continue with planned CT scan which is  scheduled for 6 months from October 08, 2017. -Cardiology consulted for  further evaluation recommendations  AKI on Chronic kidney disease stage III -Improved -Continue home losartan 100 mg p.o. daily but will discontinue hydrochlorothiazide as patient being rehydrated -Creatinine is at 1.7 on admission.  Is now improved to 1.06 -Avoid nephrotoxic medications if possible and repeat CMP in the a.m.  Hyperlipidemia -Recent lipid panel showed a cholesterol level of 226, HDL 71, LDL of 133, TG of 78 and VLDL of 16 -Continue rosuvastatin 20 mg p.o. nightly  Hypertension -BP was 143/76 -Continue home blood pressure medications but will hold Home HCTZ   Thyroid Nodules -Has history of surgical removal -We will be checking TSH and pending   COPD -Long history of asthma which she grew out of she does occasionally use an inhaler although she does not recognize a diagnosis of COPD.  History of DVT and Pulmonary Embolism -Patient is on Xarelto on a daily basis with last dose Thursday night -Started on heparin drip and will continue and Hold prior to Cholecystectomy   Sinus Bradycardia -Cardiology consulted and staying bradycardia makes beta-blocker challenging -Slightly bradycardic -Currently blood pressures at goal and will follow  Hypokalemia -Patient is potassium level this morning was 3.0 -Replete with IV potassium chloride 40 mill:'s -Continue to monitor and replete as necessary -Repeat CMP in a.m.  Hypomagnesemia -Patient magnesium levels 1.2 this morning -Replete with IV mag sulfate 3 g -Continue monitor and replete as necessary -Repeat magnesium level in a.m.  DVT prophylaxis: Anticoagulated with Heparin gtt Code Status: FULL CODE Family Communication: Discussed with family present at bedside Disposition Plan:   Consultants:   Gastroenterology  General Surgery  Cardiology    Procedures:  MRCP  ERCP Findings:      The scout film was normal. The esophagus  was successfully intubated       under direct vision. The scope was advanced to a normal major papilla in       the descending duodenum without detailed examination of the pharynx,       larynx and associated structures, and upper GI tract. The upper GI tract       was grossly normal. A small diverticulum was noted adjacent to the       ampulla. The bile duct was deeply cannulated with the sphincterotome       with ease. Contrast was injected. I personally interpreted the bile duct       images. There was brisk flow of contrast through the ducts. Image       quality was excellent. Contrast extended to the entire biliary tree. The       intra-hepatic and extra-hepatic biliary duct system was normal, no       obvious filling defect noted. A straight Roadrunner wire was passed into       the biliary tree. A 10 mm biliary sphincterotomy was made with a braided       sphincterotome using ERBE electrocautery. The sphincterotomy oozed       blood. The biliary tree was swept with a 12 mm balloon starting at the       bifurcation several times. Nothing was found.      The pancreatic duct was never injected or canulated during the entire       procedure. Impression:               - The cholangiogram was normal.                           -  A biliary sphincterotomy was performed.                           - The biliary tree was swept and nothing was found.                            Patient likely had passed CBD stones noted on MRCP,                            her liver enzymes have normallized.   Findings: A small diverticulum was noted adjacent and to the ampulla. CBD was deeply cannulated with ease. Cholangiogram did not reveal any obvious filling defects. A 10 mm sphincterotomy was performed,with minimal self-limited oozing. A 12 mm balloon was used to sweep the CBD starting from the bifurcation. Nothing was retrieved. The patient had likely spontaneously passed CBD stones, also reflected by  LFTs that have normalized.   Antimicrobials:  Anti-infectives (From admission, onward)   Start     Dose/Rate Route Frequency Ordered Stop   11/02/17 2300  ceFEPIme (MAXIPIME) 1 g in sodium chloride 0.9 % 100 mL IVPB     1 g 200 mL/hr over 30 Minutes Intravenous Every 12 hours 11/02/17 1531     11/02/17 1130  ceFEPIme (MAXIPIME) 2 g in sodium chloride 0.9 % 100 mL IVPB     2 g 200 mL/hr over 30 Minutes Intravenous  Once 11/02/17 1128 11/02/17 1335     Subjective: Seen and examined at bedside prior to ERCP and states she has had a rough year.  States her left side of her neck was hurting radiating to the back of her head and does not know if she slept well.  No chest pain, shortness of breath or lightheadedness but did have some abdominal pain which was improved after pain medication.  No other concerns or complaints at this time and wants to just get better.  Objective: Vitals:   11/04/17 1159 11/04/17 1302 11/04/17 1315 11/04/17 1422  BP: (!) 164/99 (!) 141/70 (!) 146/64 (!) 145/89  Pulse:  80 64 70  Resp: 18 14 17 20   Temp: 97.9 F (36.6 C) 98.3 F (36.8 C)  97.6 F (36.4 C)  TempSrc: Oral Oral  Oral  SpO2: 99% 100% 100% 100%  Weight: 90.6 kg     Height: 5\' 6"  (1.676 m)       Intake/Output Summary (Last 24 hours) at 11/04/2017 1524 Last data filed at 11/04/2017 1303 Gross per 24 hour  Intake 1729.14 ml  Output -  Net 1729.14 ml   Filed Weights   11/02/17 1413 11/04/17 1159  Weight: 90.6 kg 90.6 kg   Examination: Physical Exam:  Constitutional: Well-nourished, well-developed obese African-American female is currently in no acute distress appears anxious  Eyes: Sclera are anicteric.  Lids and conjunctive are normal ENMT: External ears and nose appear normal.  Grossly normal hearing. Neck: Neck appears supple no JVD however has slightly decreased range of motion and pain when moving her head to the right Respiratory: Slightly diminished to auscultation bilaterally no  appreciable wheezing, rales, rhonchi.  Patient not tachypneic using accessory muscle breathe Cardiovascular: Regular rate and rhythm with no appreciable murmurs, rubs, gallops.  No lower extremity edema Abdomen: Soft, slightly tender to palpate.  Nondistended.  Bowel sounds present all 4 quadrants GU: Deferred Musculoskeletal: No clubbing or cyanosis.  No joint deformities noted Skin: Skin is warm and dry no appreciable rashes or lesions on limited skin evaluation Neurologic: Cranial nerves II through XII grossly intact no appreciable focal deficits Psychiatric: Normal judgment and insight.  Patient is awake and alert oriented x3.  Slightly anxious mood but appropriate affect  Data Reviewed: I have personally reviewed following labs and imaging studies  CBC: Recent Labs  Lab 11/02/17 0748 11/02/17 0802 11/03/17 0604 11/04/17 0502  WBC 6.3  --  6.0 4.6  HGB 12.1 12.2 10.1* 10.7*  HCT 37.5 36.0 32.1* 32.1*  MCV 100.3*  --  102.2* 96.1  PLT 249  --  257 253   Basic Metabolic Panel: Recent Labs  Lab 11/02/17 0748 11/02/17 0802 11/03/17 0604 11/04/17 0502  NA 140 140 142 141  K 3.4* 3.5 3.5 3.0*  CL 103 100 103 102  CO2 27  --  25 30  GLUCOSE 111* 107* 88 96  BUN 28* 35* 18 10  CREATININE 1.64* 1.70* 1.14* 1.06*  CALCIUM 8.8*  --  8.5* 8.8*  MG  --   --   --  1.2*  PHOS  --   --   --  3.6   GFR: Estimated Creatinine Clearance: 48.8 mL/min (A) (by C-G formula based on SCr of 1.06 mg/dL (H)). Liver Function Tests: Recent Labs  Lab 11/02/17 0748 11/04/17 0502  AST 30 21  ALT 35 27  ALKPHOS 58 53  BILITOT 0.8 0.7  PROT 6.6 6.4*  ALBUMIN 3.2* 2.8*   Recent Labs  Lab 11/02/17 0748 11/03/17 0604 11/04/17 0502  LIPASE 1,701* 105* 47   No results for input(s): AMMONIA in the last 168 hours. Coagulation Profile: No results for input(s): INR, PROTIME in the last 168 hours. Cardiac Enzymes: No results for input(s): CKTOTAL, CKMB, CKMBINDEX, TROPONINI in the last 168  hours. BNP (last 3 results) No results for input(s): PROBNP in the last 8760 hours. HbA1C: No results for input(s): HGBA1C in the last 72 hours. CBG: No results for input(s): GLUCAP in the last 168 hours. Lipid Profile: No results for input(s): CHOL, HDL, LDLCALC, TRIG, CHOLHDL, LDLDIRECT in the last 72 hours. Thyroid Function Tests: No results for input(s): TSH, T4TOTAL, FREET4, T3FREE, THYROIDAB in the last 72 hours. Anemia Panel: No results for input(s): VITAMINB12, FOLATE, FERRITIN, TIBC, IRON, RETICCTPCT in the last 72 hours. Sepsis Labs: Recent Labs  Lab 11/02/17 0803 11/02/17 1202  LATICACIDVEN 1.49 0.78    No results found for this or any previous visit (from the past 240 hour(s)).   Radiology Studies: Mr 3d Recon At Scanner  Result Date: 11/03/2017 CLINICAL DATA:  Pancreatitis acute pancreatitis. EXAM: MRI ABDOMEN WITHOUT AND WITH CONTRAST (INCLUDING MRCP) TECHNIQUE: Multiplanar multisequence MR imaging of the abdomen was performed both before and after the administration of intravenous contrast. Heavily T2-weighted images of the biliary and pancreatic ducts were obtained, and three-dimensional MRCP images were rendered by post processing. CONTRAST:  76mL MULTIHANCE GADOBENATE DIMEGLUMINE 529 MG/ML IV SOLN COMPARISON:  CT 11/02/2017 FINDINGS: Lower chest:  Lung bases are clear. Hepatobiliary: No significant intrahepatic duct dilatation. No focal hepatic lesion. There are multiple gallstones layering dependently within the gallbladder ranging size from 2 mm to 8 mm. There approximately 24 stones. There is mild pericholecystic fluid and gallbladder wall thickening. The common bile duct is dilated to 8 mm (image 28/7). There is at least 1 distal stone in the common bile duct measuring approximately 2 mm on image 31/7). Additional stones or sludge or  stones are evident on MRCP sequence image 1/series 14. Pancreas: No significant pancreatic duct dilatation. Small amount fluid along the  body and tail the pancreas. The pancreatic parenchyma is normal signal intensity. Postcontrast imaging demonstrates uniform enhancement the pancreatic parenchyma. No organized fluid collections. Spleen: Normal spleen. Adrenals/urinary tract: Adrenal glands and kidneys are normal. Stomach/Bowel: Stomach and limited of the small bowel is unremarkable Vascular/Lymphatic: Abdominal aortic normal caliber. No retroperitoneal periportal lymphadenopathy. Musculoskeletal: No aggressive osseous lesion IMPRESSION: 1. Findings consistent with gallstone pancreatitis. 2. Several small filling defects within distal common bile duct consistent with choledocholithiasis. Mild common bile duct dilatation. 3. Multiple gallstones layering in the lumen gallbladder of varying size. Mild gallbladder wall thickening and pericholecystic fluid. Recommend clinical correlation for acute cholecystitis. 4. Mild pancreatitis. No evidence of pancreatic necrosis. No organized fluid collections. These results will be called to the ordering clinician or representative by the Radiologist Assistant, and communication documented in the PACS or zVision Dashboard. Electronically Signed   By: Suzy Bouchard M.D.   On: 11/03/2017 10:29   Dg Ercp Biliary & Pancreatic Ducts  Result Date: 11/04/2017 CLINICAL DATA:  77 year old female with common bile duct obstruction EXAM: ERCP TECHNIQUE: Multiple spot images obtained with the fluoroscopic device and submitted for interpretation post-procedure. FLUOROSCOPY TIME:  Fluoroscopy Time:  1 minutes 58 seconds COMPARISON:  MRCP 11/03/2017 FINDINGS: Two intraoperative spot images obtained during ERCP. The images demonstrate a flexible endoscope in the descending duodenum with deep wire cannulation of the common hepatic duct. The final image demonstrates partial cholangiogram with biliary ductal dilatation. IMPRESSION: ERCP. These images were submitted for radiologic interpretation only. Please see the procedural  report for the amount of contrast and the fluoroscopy time utilized. Electronically Signed   By: Jacqulynn Cadet M.D.   On: 11/04/2017 13:20   Mr Abdomen Mrcp W Wo Contast  Result Date: 11/03/2017 CLINICAL DATA:  Pancreatitis acute pancreatitis. EXAM: MRI ABDOMEN WITHOUT AND WITH CONTRAST (INCLUDING MRCP) TECHNIQUE: Multiplanar multisequence MR imaging of the abdomen was performed both before and after the administration of intravenous contrast. Heavily T2-weighted images of the biliary and pancreatic ducts were obtained, and three-dimensional MRCP images were rendered by post processing. CONTRAST:  28mL MULTIHANCE GADOBENATE DIMEGLUMINE 529 MG/ML IV SOLN COMPARISON:  CT 11/02/2017 FINDINGS: Lower chest:  Lung bases are clear. Hepatobiliary: No significant intrahepatic duct dilatation. No focal hepatic lesion. There are multiple gallstones layering dependently within the gallbladder ranging size from 2 mm to 8 mm. There approximately 24 stones. There is mild pericholecystic fluid and gallbladder wall thickening. The common bile duct is dilated to 8 mm (image 28/7). There is at least 1 distal stone in the common bile duct measuring approximately 2 mm on image 31/7). Additional stones or sludge or stones are evident on MRCP sequence image 1/series 14. Pancreas: No significant pancreatic duct dilatation. Small amount fluid along the body and tail the pancreas. The pancreatic parenchyma is normal signal intensity. Postcontrast imaging demonstrates uniform enhancement the pancreatic parenchyma. No organized fluid collections. Spleen: Normal spleen. Adrenals/urinary tract: Adrenal glands and kidneys are normal. Stomach/Bowel: Stomach and limited of the small bowel is unremarkable Vascular/Lymphatic: Abdominal aortic normal caliber. No retroperitoneal periportal lymphadenopathy. Musculoskeletal: No aggressive osseous lesion IMPRESSION: 1. Findings consistent with gallstone pancreatitis. 2. Several small filling  defects within distal common bile duct consistent with choledocholithiasis. Mild common bile duct dilatation. 3. Multiple gallstones layering in the lumen gallbladder of varying size. Mild gallbladder wall thickening and pericholecystic fluid. Recommend clinical correlation for acute cholecystitis.  4. Mild pancreatitis. No evidence of pancreatic necrosis. No organized fluid collections. These results will be called to the ordering clinician or representative by the Radiologist Assistant, and communication documented in the PACS or zVision Dashboard. Electronically Signed   By: Suzy Bouchard M.D.   On: 11/03/2017 10:29   Scheduled Meds: . aspirin EC  81 mg Oral Q M,W,F  . buPROPion  150 mg Oral Daily  . calcium-vitamin D  1 tablet Oral Q breakfast  . losartan  100 mg Oral Daily  . multivitamin with minerals  1 tablet Oral Daily  . pantoprazole  40 mg Oral Daily  . polyvinyl alcohol  1 drop Both Eyes TID  . rosuvastatin  20 mg Oral Daily   Continuous Infusions: . ceFEPime (MAXIPIME) IV 1 g (11/04/17 1105)  . heparin 1,100 Units/hr (11/04/17 1439)  . lactated ringers 50 mL/hr at 11/04/17 1441  . potassium chloride 10 mEq (11/04/17 1430)    LOS: 2 days    Kerney Elbe, DO [facility] PAGER is on [place]  If 7PM-7AM, please contact night-coverage [email or web address] Password [facility] 11/04/2017, 3:24 PM

## 2017-11-04 NOTE — Brief Op Note (Signed)
11/02/2017 - 11/04/2017  12:54 PM  PATIENT:  Denise Cline  77 y.o. female  PRE-OPERATIVE DIAGNOSIS:  CBD STONES  POST-OPERATIVE DIAGNOSIS:  No stones extracted.   PROCEDURE:  Procedure(s): ENDOSCOPIC RETROGRADE CHOLANGIOPANCREATOGRAPHY (ERCP) (N/A)  SURGEON:  Surgeon(s) and Role:    Ronnette Juniper, MD - Primary  PHYSICIAN ASSISTANT:   ASSISTANTS: Carlyn Reichert, RN, Zenon Mayo, RN, Janie Billups, Tech  ANESTHESIA:   MAC  EBL:  Minimal   BLOOD ADMINISTERED:none  DRAINS: none   LOCAL MEDICATIONS USED:  NONE  SPECIMEN:  No Specimen  DISPOSITION OF SPECIMEN:  N/A  COUNTS:  YES  TOURNIQUET:  * No tourniquets in log *  DICTATION: .Dragon Dictation  PLAN OF CARE: Admit to inpatient   PATIENT DISPOSITION:  PACU - hemodynamically stable.   Delay start of Pharmacological VTE agent (>24hrs) due to surgical blood loss or risk of bleeding: yes

## 2017-11-04 NOTE — Op Note (Signed)
[facility] Patient Name: Denise Cline Procedure Date : 11/04/2017 MRN: [id number] Attending MD: Ronnette Juniper , MD Date of Birth: 12-14-1940 CSN: [id number] Age: 77 Admit Type: Inpatient Procedure:                ERCP Indications:              Common bile duct stone(s), For therapy of acute                            pancreatitis Providers:                Ronnette Juniper, MD, Kingsley Plan, RN, Laurena Spies, Technician Referring MD:              Medicines:                Monitored Anesthesia Care Complications:            No immediate complications. Estimated blood loss:                            Minimal. Estimated Blood Loss:     Estimated blood loss was minimal. Procedure:                Pre-Anesthesia Assessment:                           - Prior to the procedure, a History and Physical                            was performed, and patient medications and                            allergies were reviewed. The patient's tolerance of                            previous anesthesia was also reviewed. The risks                            and benefits of the procedure and the sedation                            options and risks were discussed with the patient.                            All questions were answered, and informed consent                            was obtained. Prior Anticoagulants: The patient has                            taken Xarelto (rivaroxaban), last dose was 3 days                            prior to procedure. ASA  Grade Assessment: III - A                            patient with severe systemic disease. After                            reviewing the risks and benefits, the patient was                            deemed in satisfactory condition to undergo the                            procedure.                           After obtaining informed consent, the scope was                            passed under direct  vision. Throughout the                            procedure, the patient's blood pressure, pulse, and                            oxygen saturations were monitored continuously. The                            [id number]-Q180V ([id number] Olympus Duodensocope was                            introduced through the mouth, and used to inject                            contrast into and used to inject contrast into the                            bile duct. The ERCP was accomplished without                            difficulty. The patient tolerated the procedure                            well. Scope In: Scope Out: Findings:      The scout film was normal. The esophagus was successfully intubated       under direct vision. The scope was advanced to a normal major papilla in       the descending duodenum without detailed examination of the pharynx,       larynx and associated structures, and upper GI tract. The upper GI tract       was grossly normal. A small diverticulum was noted adjacent to the       ampulla. The bile duct was deeply cannulated with the sphincterotome       with ease. Contrast was injected. I personally interpreted the bile duct       images. There was brisk flow  of contrast through the ducts. Image       quality was excellent. Contrast extended to the entire biliary tree. The       intra-hepatic and extra-hepatic biliary duct system was normal, no       obvious filling defect noted. A straight Roadrunner wire was passed into       the biliary tree. A 10 mm biliary sphincterotomy was made with a braided       sphincterotome using ERBE electrocautery. The sphincterotomy oozed       blood. The biliary tree was swept with a 12 mm balloon starting at the       bifurcation several times. Nothing was found.      The pancreatic duct was never injected or canulated during the entire       procedure. Impression:               - The cholangiogram was normal.                           - A  biliary sphincterotomy was performed.                           - The biliary tree was swept and nothing was found.                            Patient likely had passed CBD stones noted on MRCP,                            her liver enzymes have normallized. Moderate Sedation:      Patient did not receive moderate sedation for this procedure, but       instead received monitored anesthesia care. Recommendation:           - Full liquid diet.                           - Cholecystectomy likely tomorrow as per surgical                            evaluation, will keep paitent NPO post midnight.,                           Will resume heparin drip, to be stopped before                            surgery as per surgical evaluation. Procedure Code(s):        --- Professional ---                           [id number], Endoscopic retrograde                            cholangiopancreatography (ERCP); with                            sphincterotomy/papillotomy  74328, Endoscopic catheterization of the biliary                            ductal system, radiological supervision and                            interpretation Diagnosis Code(s):        --- Professional ---                           K80.50, Calculus of bile duct without cholangitis                            or cholecystitis without obstruction                           K85.90, Acute pancreatitis without necrosis or                            infection, unspecified CPT copyright 2017 [employer]. All rights reserved. The codes documented in this report are preliminary and upon coder review may  be revised to meet current compliance requirements. Ronnette Juniper, MD 11/04/2017 12:54:06 PM This report has been signed electronically. Number of Addenda: 0

## 2017-11-04 NOTE — Interval H&P Note (Signed)
History and Physical Interval Note:  77/female with gallstone pancreatitis and CBD stones noted on MRCP for an ERCP today. 11/04/2017 11:48 AM  Denise Cline  has presented today for ERCP with the diagnosis of CBD STONES  The various methods of treatment have been discussed with the patient and family. After consideration of risks, benefits and other options for treatment, the patient has consented to  Procedure(s): ENDOSCOPIC RETROGRADE CHOLANGIOPANCREATOGRAPHY (ERCP) (N/A) as a surgical intervention .  The patient's history has been reviewed, patient examined, no change in status, stable for surgery.  I have reviewed the patient's chart and labs.  Questions were answered to the patient's satisfaction.     Denise Cline

## 2017-11-04 NOTE — Anesthesia Postprocedure Evaluation (Signed)
Anesthesia Post Note  Patient: Denise Cline  Procedure(s) Performed: ENDOSCOPIC RETROGRADE CHOLANGIOPANCREATOGRAPHY (ERCP) (N/A )     Patient location during evaluation: PACU Anesthesia Type: General Level of consciousness: awake and alert, oriented and awake Pain management: pain level controlled Vital Signs Assessment: post-procedure vital signs reviewed and stable Respiratory status: spontaneous breathing, nonlabored ventilation and respiratory function stable Cardiovascular status: blood pressure returned to baseline and stable Postop Assessment: no apparent nausea or vomiting Anesthetic complications: no    Last Vitals:  Vitals:   11/04/17 1302 11/04/17 1315  BP: (!) 141/70 (!) 146/64  Pulse: 80 64  Resp: 14 17  Temp: 36.8 C   SpO2: 100% 100%    Last Pain:  Vitals:   11/04/17 1315  TempSrc:   PainSc: 0-No pain                 Catalina Gravel

## 2017-11-04 NOTE — Op Note (Signed)
ERCP was performed for removal of CBD stones noted on MRCP and  gallstone pancreatitis.  Findings: A small diverticulum was noted adjacent and to the ampulla. CBD was deeply cannulated with ease. Cholangiogram did not reveal any obvious filling defects. A 10 mm sphincterotomy was performed,with minimal self-limited oozing. A 12 mm balloon was used to sweep the CBD starting from the bifurcation. Nothing was retrieved. The patient had likely spontaneously passed CBD stones, also reflected by LFTs that have normalized.  Recommendations: Full liquid diet, nothing by mouth post midnight in anticipation for cholecystectomy tomorrow. Okay to resume heparin drip, heparin drip to be stopped as per timing for surgery to be determined by the primary team and surgical team.  Ronnette Juniper, M.D.

## 2017-11-04 NOTE — Progress Notes (Signed)
   No new cardiac suggestions.  Chart reviewed.  Will follow.

## 2017-11-04 NOTE — Plan of Care (Signed)
  Problem: Education: Goal: Knowledge of General Education information will improve Description Including pain rating scale, medication(s)/side effects and non-pharmacologic comfort measures 11/04/2017 1544 by Roger Kill, RN Outcome: Progressing 11/04/2017 1232 by Roger Kill, RN Outcome: Progressing   Problem: Health Behavior/Discharge Planning: Goal: Ability to manage health-related needs will improve 11/04/2017 1544 by Roger Kill, RN Outcome: Progressing 11/04/2017 1232 by Roger Kill, RN Outcome: Progressing   Problem: Clinical Measurements: Goal: Ability to maintain clinical measurements within normal limits will improve 11/04/2017 1544 by Roger Kill, RN Outcome: Progressing 11/04/2017 1232 by Roger Kill, RN Outcome: Progressing Goal: Will remain free from infection 11/04/2017 1544 by Roger Kill, RN Outcome: Progressing 11/04/2017 1232 by Roger Kill, RN Outcome: Progressing Goal: Diagnostic test results will improve 11/04/2017 1544 by Roger Kill, RN Outcome: Progressing 11/04/2017 1232 by Roger Kill, RN Outcome: Progressing Goal: Respiratory complications will improve 11/04/2017 1544 by Roger Kill, RN Outcome: Progressing 11/04/2017 1232 by Roger Kill, RN Outcome: Progressing Goal: Cardiovascular complication will be avoided Outcome: Progressing   Problem: Activity: Goal: Risk for activity intolerance will decrease 11/04/2017 1544 by Roger Kill, RN Outcome: Progressing 11/04/2017 1232 by Roger Kill, RN Outcome: Progressing   Problem: Nutrition: Goal: Adequate nutrition will be maintained Outcome: Progressing

## 2017-11-04 NOTE — Transfer of Care (Signed)
Immediate Anesthesia Transfer of Care Note  Patient: Denise Cline  Procedure(s) Performed: ENDOSCOPIC RETROGRADE CHOLANGIOPANCREATOGRAPHY (ERCP) (N/A )  Patient Location: Endoscopy Unit  Anesthesia Type:General  Level of Consciousness: awake, alert  and oriented  Airway & Oxygen Therapy: Patient Spontanous Breathing and Patient connected to face mask oxygen  Post-op Assessment: Report given to RN and Post -op Vital signs reviewed and stable  Post vital signs: Reviewed and stable  Last Vitals:  Vitals Value Taken Time  BP 141/70 11/04/2017  1:02 PM  Temp    Pulse 80 11/04/2017  1:02 PM  Resp    SpO2 100 % 11/04/2017  1:02 PM  Vitals shown include unvalidated device data.  Last Pain:  Vitals:   11/04/17 1302  TempSrc: (P) Oral  PainSc:          Complications: No apparent anesthesia complications

## 2017-11-04 NOTE — Anesthesia Preprocedure Evaluation (Signed)
Anesthesia Evaluation  Patient identified by MRN, date of birth, ID band Patient awake    Reviewed: Allergy & Precautions, NPO status , Patient's Chart, lab work & pertinent test results  Airway Mallampati: II  TM Distance: >3 FB Neck ROM: Full    Dental  (+) Dental Advisory Given, Missing   Pulmonary sleep apnea , COPD, former smoker, PE   Pulmonary exam normal breath sounds clear to auscultation       Cardiovascular hypertension, Pt. on medications + CAD and + Peripheral Vascular Disease  Normal cardiovascular exam Rhythm:Regular Rate:Normal  Echo 05/22/17: - Normal LV size with mild LV hypertrophy. EF 65-70%. Normal RV size and systolic function. No significant valvular abnormalities. Mildly dilated aortic root and ascending aorta.   Neuro/Psych PSYCHIATRIC DISORDERS Anxiety negative neurological ROS     GI/Hepatic GERD  Medicated,CBD STONES   Endo/Other  Obesity   Renal/GU Renal InsufficiencyRenal disease     Musculoskeletal negative musculoskeletal ROS (+)   Abdominal   Peds  Hematology  (+) Blood dyscrasia (Xarelto), ,   Anesthesia Other Findings Day of surgery medications reviewed with the patient.  Reproductive/Obstetrics                             Anesthesia Physical Anesthesia Plan  ASA: III  Anesthesia Plan: General   Post-op Pain Management:    Induction: Intravenous  PONV Risk Score and Plan: 3 and Dexamethasone and Ondansetron  Airway Management Planned: Oral ETT  Additional Equipment:   Intra-op Plan:   Post-operative Plan: Extubation in OR  Informed Consent: I have reviewed the patients History and Physical, chart, labs and discussed the procedure including the risks, benefits and alternatives for the proposed anesthesia with the patient or authorized representative who has indicated his/her understanding and acceptance.   Dental advisory given  Plan  Discussed with: CRNA  Anesthesia Plan Comments:         Anesthesia Quick Evaluation

## 2017-11-04 NOTE — Anesthesia Procedure Notes (Signed)
Procedure Name: Intubation Date/Time: 11/04/2017 12:30 PM Performed by: Teressa Lower., CRNA Pre-anesthesia Checklist: Patient identified, Emergency Drugs available, Suction available and Patient being monitored Patient Re-evaluated:Patient Re-evaluated prior to induction Oxygen Delivery Method: Circle system utilized Preoxygenation: Pre-oxygenation with 100% oxygen Induction Type: IV induction and Cricoid Pressure applied Ventilation: Mask ventilation without difficulty Laryngoscope Size: Mac and 3 Grade View: Grade II Tube type: Oral Tube size: 7.0 mm Number of attempts: 1 Airway Equipment and Method: Stylet and Oral airway Placement Confirmation: ETT inserted through vocal cords under direct vision,  positive ETCO2 and breath sounds checked- equal and bilateral Tube secured with: Tape Dental Injury: Teeth and Oropharynx as per pre-operative assessment  Comments: Grade 2b view

## 2017-11-04 NOTE — Progress Notes (Signed)
[place] for Heparin  Indication: pulmonary embolus   Allergies  Allergen Reactions  . Penicillins Anaphylaxis    Has patient had a PCN reaction causing immediate rash, facial/tongue/throat swelling, SOB or lightheadedness with hypotension: yes Has patient had a PCN reaction causing severe rash involving mucus membranes or skin necrosis: no Has patient had a PCN reaction that required hospitalization yes Has patient had a PCN reaction occurring within the last 10 years: no If all of the above answers are "NO", then may proceed with Cephalosporin use.   . Sulfa Antibiotics Hives  . Oxycodone Other (See Comments)    hallucanations     Patient Measurements: Height: 5\' 6"  (167.6 cm) Weight: 199 lb 11.8 oz (90.6 kg) IBW/kg (Calculated) : 59.3 Heparin Dosing Weight: 81 kg  Vital Signs: Temp: 98.3 F (36.8 C) (09/02 1302) Temp Source: Oral (09/02 1302) BP: 146/64 (09/02 1315) Pulse Rate: 64 (09/02 1315)  Labs: Recent Labs    11/02/17 0748 11/02/17 0802  11/02/17 2248 11/03/17 0604 11/04/17 0502  HGB 12.1 12.2  --   --  10.1* 10.7*  HCT 37.5 36.0  --   --  32.1* 32.1*  PLT 249  --   --   --  257 275  APTT  --   --    < > 89* 106* 36  HEPARINUNFRC  --   --    < > 0.80* 0.77* <0.10*  CREATININE 1.64* 1.70*  --   --  1.14* 1.06*   < > = values in this interval not displayed.    Estimated Creatinine Clearance: 48.8 mL/min (A) (by C-G formula based on SCr of 1.06 mg/dL (H)).  Assessment: 77 year old female with h/o PE on xarelto PTA (LD 8/29) transitioned to heparin. Heparin was held today for ERCP. Per GI, ok to resume but will likely need to off again tomorrow for surgery. Timing of surgery unknown at this time.   Heparin levels an aPTT seemed to be correlating so will dose based on heparin levels going forward. No bleeding noted.  Goal of Therapy:  Heparin level 0.3-0.7 units/ml Monitor platelets by anticoagulation protocol: Yes    Plan:  Restart heparin gtt 1100 units/hr Check an 8 hr heparin level F/u timing of surgery so heparin can be discontinued   Salome Arnt, PharmD, BCPS Please see AMION for all pharmacy numbers 11/04/2017 1:52 PM

## 2017-11-04 NOTE — Progress Notes (Signed)
[place] for Heparin  Indication: pulmonary embolus   Allergies  Allergen Reactions  . Penicillins Anaphylaxis    Has patient had a PCN reaction causing immediate rash, facial/tongue/throat swelling, SOB or lightheadedness with hypotension: yes Has patient had a PCN reaction causing severe rash involving mucus membranes or skin necrosis: no Has patient had a PCN reaction that required hospitalization yes Has patient had a PCN reaction occurring within the last 10 years: no If all of the above answers are "NO", then may proceed with Cephalosporin use.   . Sulfa Antibiotics Hives  . Oxycodone Other (See Comments)    hallucanations     Patient Measurements: Height: 5\' 6"  (167.6 cm) Weight: 199 lb 11.8 oz (90.6 kg) IBW/kg (Calculated) : 59.3 Heparin Dosing Weight: 81 kg  Vital Signs: Temp: 98.4 F (36.9 C) (09/02 2209) Temp Source: Oral (09/02 2209) BP: 158/80 (09/02 2209) Pulse Rate: 87 (09/02 2209)  Labs: Recent Labs    11/02/17 0748 11/02/17 0802  11/02/17 2248 11/03/17 0604 11/04/17 0502 11/04/17 2316  HGB 12.1 12.2  --   --  10.1* 10.7*  --   HCT 37.5 36.0  --   --  32.1* 32.1*  --   PLT 249  --   --   --  257 275  --   APTT  --   --    < > 89* 106* 36  --   HEPARINUNFRC  --   --    < > 0.80* 0.77* <0.10* 0.54  CREATININE 1.64* 1.70*  --   --  1.14* 1.06*  --    < > = values in this interval not displayed.    Estimated Creatinine Clearance: 48.8 mL/min (A) (by C-G formula based on SCr of 1.06 mg/dL (H)).  Assessment: 77 year old female with h/o PE on xarelto PTA (LD 8/29) transitioned to heparin. Heparin was held today for ERCP. Per GI, ok to resume but will likely need to off again tomorrow for surgery. Timing of surgery unknown at this time.   Heparin levels an aPTT seemed to be correlating so will dose based on heparin levels going forward. No bleeding noted.  9/2 PM update: heparin level therapeutic x 1 after  re-start  Goal of Therapy:  Heparin level 0.3-0.7 units/ml Monitor platelets by anticoagulation protocol: Yes   Plan:  Heparin gtt 1100 units/hr Confirmatory heparin level with AM labs F/u timing of surgery so heparin can be discontinued   Narda Bonds, PharmD, [facility] Pharmacist Phone: [phone]

## 2017-11-04 NOTE — Progress Notes (Signed)
Going for ERCP today. Will plan possible cholecystectomy tomorrow following his procedure today.  Sharon Mt. Dema Severin, M.D. [facility]

## 2017-11-05 ENCOUNTER — Encounter (HOSPITAL_COMMUNITY): Payer: Self-pay | Admitting: Gastroenterology

## 2017-11-05 ENCOUNTER — Inpatient Hospital Stay (HOSPITAL_COMMUNITY): Payer: Medicare HMO | Admitting: Certified Registered"

## 2017-11-05 ENCOUNTER — Encounter (HOSPITAL_COMMUNITY)
Admission: EM | Disposition: A | Discharge status: Home / Self Care | Payer: Self-pay | Source: Home / Self Care | Attending: Internal Medicine

## 2017-11-05 ENCOUNTER — Inpatient Hospital Stay (HOSPITAL_COMMUNITY): Payer: Medicare HMO

## 2017-11-05 DIAGNOSIS — I10 Essential (primary) hypertension: Secondary | ICD-10-CM

## 2017-11-05 HISTORY — PX: LAPAROSCOPIC CHOLECYSTECTOMY W/ CHOLANGIOGRAPHY: SUR757

## 2017-11-05 HISTORY — PX: CHOLECYSTECTOMY: SHX55

## 2017-11-05 LAB — CBC WITH DIFFERENTIAL/PLATELET
Abs Immature Granulocytes: 0 10*3/uL (ref 0.0–0.1)
Basophils Absolute: 0 10*3/uL (ref 0.0–0.1)
Basophils Relative: 0 %
EOS ABS: 0 10*3/uL (ref 0.0–0.7)
Eosinophils Relative: 0 %
HCT: 33 % — ABNORMAL LOW (ref 36.0–46.0)
Hemoglobin: 10.9 g/dL — ABNORMAL LOW (ref 12.0–15.0)
IMMATURE GRANULOCYTES: 0 %
Lymphocytes Relative: 18 %
Lymphs Abs: 0.8 10*3/uL (ref 0.7–4.0)
MCH: 31.8 pg (ref 26.0–34.0)
MCHC: 33 g/dL (ref 30.0–36.0)
MCV: 96.2 fL (ref 78.0–100.0)
MONO ABS: 0.2 10*3/uL (ref 0.1–1.0)
MONOS PCT: 4 %
NEUTROS PCT: 78 %
Neutro Abs: 3.5 10*3/uL (ref 1.7–7.7)
Platelets: 269 10*3/uL (ref 150–400)
RBC: 3.43 MIL/uL — ABNORMAL LOW (ref 3.87–5.11)
RDW: 12.7 % (ref 11.5–15.5)
WBC: 4.5 10*3/uL (ref 4.0–10.5)

## 2017-11-05 LAB — COMPREHENSIVE METABOLIC PANEL
ALT: 31 U/L (ref 0–44)
AST: 30 U/L (ref 15–41)
Albumin: 2.9 g/dL — ABNORMAL LOW (ref 3.5–5.0)
Alkaline Phosphatase: 53 U/L (ref 38–126)
Anion gap: 8 (ref 5–15)
BILIRUBIN TOTAL: 0.7 mg/dL (ref 0.3–1.2)
BUN: 12 mg/dL (ref 8–23)
CO2: 28 mmol/L (ref 22–32)
CREATININE: 1.04 mg/dL — AB (ref 0.44–1.00)
Calcium: 8.8 mg/dL — ABNORMAL LOW (ref 8.9–10.3)
Chloride: 103 mmol/L (ref 98–111)
GFR calc Af Amer: 58 mL/min — ABNORMAL LOW (ref >60)
GFR, EST NON AFRICAN AMERICAN: 50 mL/min — AB (ref >60)
Glucose, Bld: 135 mg/dL — ABNORMAL HIGH (ref 70–99)
POTASSIUM: 3.6 mmol/L (ref 3.5–5.1)
Sodium: 139 mmol/L (ref 135–145)
TOTAL PROTEIN: 6.6 g/dL (ref 6.5–8.1)

## 2017-11-05 LAB — MRSA PCR SCREENING: MRSA by PCR: NEGATIVE

## 2017-11-05 LAB — HEPARIN LEVEL (UNFRACTIONATED): Heparin Unfractionated: 0.68 IU/mL (ref 0.30–0.70)

## 2017-11-05 LAB — PHOSPHORUS: Phosphorus: 2.7 mg/dL (ref 2.5–4.6)

## 2017-11-05 LAB — MAGNESIUM: MAGNESIUM: 1.5 mg/dL — AB (ref 1.7–2.4)

## 2017-11-05 LAB — TSH: TSH: 0.403 u[IU]/mL (ref 0.350–4.500)

## 2017-11-05 SURGERY — LAPAROSCOPIC CHOLECYSTECTOMY WITH INTRAOPERATIVE CHOLANGIOGRAM
Anesthesia: General | Site: Abdomen

## 2017-11-05 MED ORDER — LIDOCAINE 2% (20 MG/ML) 5 ML SYRINGE
INTRAMUSCULAR | Status: AC
  Filled 2017-11-05: qty 5

## 2017-11-05 MED ORDER — ROCURONIUM BROMIDE 50 MG/5ML IV SOSY
PREFILLED_SYRINGE | INTRAVENOUS | Status: AC
  Filled 2017-11-05: qty 5

## 2017-11-05 MED ORDER — ONDANSETRON HCL 4 MG/2ML IJ SOLN: 4.0000 mg | Freq: Four times a day (QID) | INTRAMUSCULAR | Status: DC | PRN

## 2017-11-05 MED ORDER — FENTANYL CITRATE (PF) 250 MCG/5ML IJ SOLN
INTRAMUSCULAR | Status: AC
  Filled 2017-11-05: qty 5

## 2017-11-05 MED ORDER — IOPAMIDOL (ISOVUE-300) INJECTION 61%
INTRAVENOUS | Status: AC
  Filled 2017-11-05: qty 50

## 2017-11-05 MED ORDER — FENTANYL CITRATE (PF) 100 MCG/2ML IJ SOLN
INTRAMUSCULAR | Status: DC | PRN
  Administered 2017-11-05 (×7): 50 ug via INTRAVENOUS

## 2017-11-05 MED ORDER — DEXAMETHASONE SODIUM PHOSPHATE 10 MG/ML IJ SOLN
INTRAMUSCULAR | Status: AC
  Filled 2017-11-05: qty 1

## 2017-11-05 MED ORDER — ONDANSETRON HCL 4 MG/2ML IJ SOLN
INTRAMUSCULAR | Status: AC
  Filled 2017-11-05: qty 2

## 2017-11-05 MED ORDER — PROPOFOL 10 MG/ML IV BOLUS
INTRAVENOUS | Status: AC
  Filled 2017-11-05: qty 20

## 2017-11-05 MED ORDER — SUGAMMADEX SODIUM 200 MG/2ML IV SOLN
INTRAVENOUS | Status: DC | PRN
  Administered 2017-11-05: 190 mg via INTRAVENOUS

## 2017-11-05 MED ORDER — SODIUM CHLORIDE 0.9 % IV SOLN
INTRAVENOUS | Status: DC | PRN
  Administered 2017-11-05: 16 mL

## 2017-11-05 MED ORDER — SIMETHICONE 80 MG PO CHEW
160.0000 mg | CHEWABLE_TABLET | Freq: Once | ORAL | Status: AC
  Administered 2017-11-05: 160 mg via ORAL
  Filled 2017-11-05: qty 2

## 2017-11-05 MED ORDER — DEXAMETHASONE SODIUM PHOSPHATE 10 MG/ML IJ SOLN
INTRAMUSCULAR | Status: DC | PRN
  Administered 2017-11-05: 10 mg via INTRAVENOUS

## 2017-11-05 MED ORDER — 0.9 % SODIUM CHLORIDE (POUR BTL) OPTIME
TOPICAL | Status: DC | PRN
  Administered 2017-11-05: 1000 mL

## 2017-11-05 MED ORDER — FENTANYL CITRATE (PF) 100 MCG/2ML IJ SOLN: 25.0000 ug | INTRAMUSCULAR | Status: DC | PRN

## 2017-11-05 MED ORDER — BUPIVACAINE-EPINEPHRINE (PF) 0.25% -1:200000 IJ SOLN
INTRAMUSCULAR | Status: AC
  Filled 2017-11-05: qty 30

## 2017-11-05 MED ORDER — ROCURONIUM BROMIDE 50 MG/5ML IV SOSY
PREFILLED_SYRINGE | INTRAVENOUS | Status: DC | PRN
  Administered 2017-11-05: 10 mg via INTRAVENOUS
  Administered 2017-11-05: 40 mg via INTRAVENOUS

## 2017-11-05 MED ORDER — STERILE WATER FOR IRRIGATION IR SOLN
Status: DC | PRN
  Administered 2017-11-05: 1000 mL

## 2017-11-05 MED ORDER — BUPIVACAINE-EPINEPHRINE 0.25% -1:200000 IJ SOLN
INTRAMUSCULAR | Status: DC | PRN
  Administered 2017-11-05: 20 mL

## 2017-11-05 MED ORDER — SUGAMMADEX SODIUM 500 MG/5ML IV SOLN
INTRAVENOUS | Status: AC
  Filled 2017-11-05: qty 5

## 2017-11-05 MED ORDER — EPHEDRINE SULFATE 50 MG/ML IJ SOLN
INTRAMUSCULAR | Status: DC | PRN
  Administered 2017-11-05: 10 mg via INTRAVENOUS

## 2017-11-05 MED ORDER — ONDANSETRON HCL 4 MG/2ML IJ SOLN
INTRAMUSCULAR | Status: DC | PRN
  Administered 2017-11-05: 4 mg via INTRAVENOUS

## 2017-11-05 MED ORDER — MORPHINE SULFATE (PF) 2 MG/ML IV SOLN: 1.0000 mg | INTRAVENOUS | Status: DC | PRN

## 2017-11-05 MED ORDER — EPHEDRINE 5 MG/ML INJ
INTRAVENOUS | Status: AC
  Filled 2017-11-05: qty 20

## 2017-11-05 MED ORDER — LIDOCAINE 2% (20 MG/ML) 5 ML SYRINGE
INTRAMUSCULAR | Status: DC | PRN
  Administered 2017-11-05: 60 mg via INTRAVENOUS

## 2017-11-05 MED ORDER — PROPOFOL 10 MG/ML IV BOLUS
INTRAVENOUS | Status: DC | PRN
  Administered 2017-11-05: 160 mg via INTRAVENOUS

## 2017-11-05 MED ORDER — MAGNESIUM SULFATE 50 % IJ SOLN
3.0000 g | Freq: Once | INTRAVENOUS | Status: AC
  Administered 2017-11-05: 3 g via INTRAVENOUS
  Filled 2017-11-05: qty 6

## 2017-11-05 MED ORDER — SODIUM CHLORIDE 0.9 % IR SOLN
Status: DC | PRN
  Administered 2017-11-05: 1000 mL

## 2017-11-05 SURGICAL SUPPLY — 34 items
APPLIER CLIP 5 13 M/L LIGAMAX5 (MISCELLANEOUS) ×2
BLADE CLIPPER SURG (BLADE) IMPLANT
CANISTER SUCT 3000ML PPV (MISCELLANEOUS) ×2 IMPLANT
CATH REDDICK CHOLANGI 4FR 50CM (CATHETERS) ×2 IMPLANT
CHLORAPREP W/TINT 26ML (MISCELLANEOUS) ×2 IMPLANT
CLIP APPLIE 5 13 M/L LIGAMAX5 (MISCELLANEOUS) ×1 IMPLANT
COVER MAYO STAND STRL (DRAPES) ×2 IMPLANT
COVER SURGICAL LIGHT HANDLE (MISCELLANEOUS) ×2 IMPLANT
DERMABOND ADVANCED (GAUZE/BANDAGES/DRESSINGS) ×2
DERMABOND ADVANCED .7 DNX12 (GAUZE/BANDAGES/DRESSINGS) ×1 IMPLANT
DRAPE C-ARM 42X72 X-RAY (DRAPES) ×2 IMPLANT
ELECT REM PT RETURN 9FT ADLT (ELECTROSURGICAL) ×2
ELECTRODE REM PT RTRN 9FT ADLT (ELECTROSURGICAL) ×1 IMPLANT
GLOVE BIO SURGEON STRL SZ7.5 (GLOVE) ×2 IMPLANT
GOWN STRL REUS W/ TWL LRG LVL3 (GOWN DISPOSABLE) ×3 IMPLANT
GOWN STRL REUS W/TWL LRG LVL3 (GOWN DISPOSABLE) ×3
IV CATH 14GX2 1/4 (CATHETERS) ×2 IMPLANT
KIT BASIN OR (CUSTOM PROCEDURE TRAY) ×2 IMPLANT
KIT TURNOVER KIT B (KITS) ×2 IMPLANT
NS IRRIG 1000ML POUR BTL (IV SOLUTION) ×2 IMPLANT
PAD ARMBOARD 7.5X6 YLW CONV (MISCELLANEOUS) ×2 IMPLANT
POUCH SPECIMEN RETRIEVAL 10MM (ENDOMECHANICALS) ×2 IMPLANT
SCISSORS LAP 5X35 DISP (ENDOMECHANICALS) ×2 IMPLANT
SET IRRIG TUBING LAPAROSCOPIC (IRRIGATION / IRRIGATOR) ×2 IMPLANT
SLEEVE ENDOPATH XCEL 5M (ENDOMECHANICALS) ×4 IMPLANT
SPECIMEN JAR SMALL (MISCELLANEOUS) ×2 IMPLANT
SUT MNCRL AB 4-0 PS2 18 (SUTURE) ×3 IMPLANT
TOWEL OR 17X24 6PK STRL BLUE (TOWEL DISPOSABLE) ×2 IMPLANT
TOWEL OR 17X26 10 PK STRL BLUE (TOWEL DISPOSABLE) ×2 IMPLANT
TRAY LAPAROSCOPIC MC (CUSTOM PROCEDURE TRAY) ×2 IMPLANT
TROCAR XCEL BLUNT TIP 100MML (ENDOMECHANICALS) ×2 IMPLANT
TROCAR XCEL NON-BLD 5MMX100MML (ENDOMECHANICALS) ×2 IMPLANT
TUBING INSUFFLATION (TUBING) ×2 IMPLANT
WATER STERILE IRR 1000ML POUR (IV SOLUTION) ×2 IMPLANT

## 2017-11-05 NOTE — Progress Notes (Signed)
Progress Note  Patient Name: Denise Cline Date of Encounter: 11/05/2017  Primary Cardiologist: Dr Tamala Julian   Subjective   No chest pain or dyspnea  Inpatient Medications    Scheduled Meds: . aspirin EC  81 mg Oral Q M,W,F  . buPROPion  150 mg Oral Daily  . calcium-vitamin D  1 tablet Oral Q breakfast  . losartan  100 mg Oral Daily  . multivitamin with minerals  1 tablet Oral Daily  . pantoprazole  40 mg Oral Daily  . polyvinyl alcohol  1 drop Both Eyes TID  . rosuvastatin  20 mg Oral Daily   Continuous Infusions: . ceFEPime (MAXIPIME) IV 1 g (11/04/17 2219)  . lactated ringers 50 mL/hr at 11/05/17 0401   PRN Meds: acetaminophen **OR** acetaminophen, methocarbamol, morphine injection, ondansetron **OR** ondansetron (ZOFRAN) IV   Vital Signs    Vitals:   11/04/17 1422 11/04/17 2209 11/05/17 0217 11/05/17 0442  BP: (!) 145/89 (!) 158/80 (!) 153/82 (!) 158/91  Pulse: 70 87 69 65  Resp: 20 20 18 18   Temp: 97.6 F (36.4 C) 98.4 F (36.9 C) 98.4 F (36.9 C) 98 F (36.7 C)  TempSrc: Oral Oral Oral Oral  SpO2: 100% 100% 97% 98%  Weight:      Height:        Intake/Output Summary (Last 24 hours) at 11/05/2017 0815 Last data filed at 11/05/2017 0600 Gross per 24 hour  Intake 3255.64 ml  Output 1000 ml  Net 2255.64 ml   Filed Weights   11/02/17 1413 11/04/17 1159  Weight: 90.6 kg 90.6 kg    Physical Exam   GEN: No acute distress.   Neck: No JVD Cardiac: RRR, no murmurs, rubs, or gallops.  Respiratory: Clear to auscultation bilaterally. GI: Soft, mild tenderness MS: No edema Neuro:  Nonfocal  Psych: Normal affect   Labs    Chemistry Recent Labs  Lab 11/02/17 0748  11/03/17 0604 11/04/17 0502 11/05/17 0439  NA 140   < > 142 141 139  K 3.4*   < > 3.5 3.0* 3.6  CL 103   < > 103 102 103  CO2 27  --  25 30 28   GLUCOSE 111*   < > 88 96 135*  BUN 28*   < > 18 10 12   CREATININE 1.64*   < > 1.14* 1.06* 1.04*  CALCIUM 8.8*  --  8.5* 8.8* 8.8*  PROT 6.6   --   --  6.4* 6.6  ALBUMIN 3.2*  --   --  2.8* 2.9*  AST 30  --   --  21 30  ALT 35  --   --  27 31  ALKPHOS 58  --   --  53 53  BILITOT 0.8  --   --  0.7 0.7  GFRNONAA 29*  --  45* 49* 50*  GFRAA 33*  --  52* 56* 58*  ANIONGAP 10  --  14 9 8    < > = values in this interval not displayed.     Hematology Recent Labs  Lab 11/03/17 0604 11/04/17 0502 11/05/17 0439  WBC 6.0 4.6 4.5  RBC 3.14* 3.34* 3.43*  HGB 10.1* 10.7* 10.9*  HCT 32.1* 32.1* 33.0*  MCV 102.2* 96.1 96.2  MCH 32.2 32.0 31.8  MCHC 31.5 33.3 33.0  RDW 13.6 12.8 12.7  PLT 257 275 269     Recent Labs  Lab 11/02/17 0801  TROPIPOC 0.01     Radiology  Mr 3d Recon At Scanner  Result Date: 11/03/2017 CLINICAL DATA:  Pancreatitis acute pancreatitis. EXAM: MRI ABDOMEN WITHOUT AND WITH CONTRAST (INCLUDING MRCP) TECHNIQUE: Multiplanar multisequence MR imaging of the abdomen was performed both before and after the administration of intravenous contrast. Heavily T2-weighted images of the biliary and pancreatic ducts were obtained, and three-dimensional MRCP images were rendered by post processing. CONTRAST:  61mL MULTIHANCE GADOBENATE DIMEGLUMINE 529 MG/ML IV SOLN COMPARISON:  CT 11/02/2017 FINDINGS: Lower chest:  Lung bases are clear. Hepatobiliary: No significant intrahepatic duct dilatation. No focal hepatic lesion. There are multiple gallstones layering dependently within the gallbladder ranging size from 2 mm to 8 mm. There approximately 24 stones. There is mild pericholecystic fluid and gallbladder wall thickening. The common bile duct is dilated to 8 mm (image 28/7). There is at least 1 distal stone in the common bile duct measuring approximately 2 mm on image 31/7). Additional stones or sludge or stones are evident on MRCP sequence image 1/series 14. Pancreas: No significant pancreatic duct dilatation. Small amount fluid along the body and tail the pancreas. The pancreatic parenchyma is normal signal intensity.  Postcontrast imaging demonstrates uniform enhancement the pancreatic parenchyma. No organized fluid collections. Spleen: Normal spleen. Adrenals/urinary tract: Adrenal glands and kidneys are normal. Stomach/Bowel: Stomach and limited of the small bowel is unremarkable Vascular/Lymphatic: Abdominal aortic normal caliber. No retroperitoneal periportal lymphadenopathy. Musculoskeletal: No aggressive osseous lesion IMPRESSION: 1. Findings consistent with gallstone pancreatitis. 2. Several small filling defects within distal common bile duct consistent with choledocholithiasis. Mild common bile duct dilatation. 3. Multiple gallstones layering in the lumen gallbladder of varying size. Mild gallbladder wall thickening and pericholecystic fluid. Recommend clinical correlation for acute cholecystitis. 4. Mild pancreatitis. No evidence of pancreatic necrosis. No organized fluid collections. These results will be called to the ordering clinician or representative by the Radiologist Assistant, and communication documented in the PACS or zVision Dashboard. Electronically Signed   By: Suzy Bouchard M.D.   On: 11/03/2017 10:29   Dg Ercp Biliary & Pancreatic Ducts  Result Date: 11/04/2017 CLINICAL DATA:  77 year old female with common bile duct obstruction EXAM: ERCP TECHNIQUE: Multiple spot images obtained with the fluoroscopic device and submitted for interpretation post-procedure. FLUOROSCOPY TIME:  Fluoroscopy Time:  1 minutes 58 seconds COMPARISON:  MRCP 11/03/2017 FINDINGS: Two intraoperative spot images obtained during ERCP. The images demonstrate a flexible endoscope in the descending duodenum with deep wire cannulation of the common hepatic duct. The final image demonstrates partial cholangiogram with biliary ductal dilatation. IMPRESSION: ERCP. These images were submitted for radiologic interpretation only. Please see the procedural report for the amount of contrast and the fluoroscopy time utilized. Electronically  Signed   By: Jacqulynn Cadet M.D.   On: 11/04/2017 13:20   Mr Abdomen Mrcp W Wo Contast  Result Date: 11/03/2017 CLINICAL DATA:  Pancreatitis acute pancreatitis. EXAM: MRI ABDOMEN WITHOUT AND WITH CONTRAST (INCLUDING MRCP) TECHNIQUE: Multiplanar multisequence MR imaging of the abdomen was performed both before and after the administration of intravenous contrast. Heavily T2-weighted images of the biliary and pancreatic ducts were obtained, and three-dimensional MRCP images were rendered by post processing. CONTRAST:  33mL MULTIHANCE GADOBENATE DIMEGLUMINE 529 MG/ML IV SOLN COMPARISON:  CT 11/02/2017 FINDINGS: Lower chest:  Lung bases are clear. Hepatobiliary: No significant intrahepatic duct dilatation. No focal hepatic lesion. There are multiple gallstones layering dependently within the gallbladder ranging size from 2 mm to 8 mm. There approximately 24 stones. There is mild pericholecystic fluid and gallbladder wall thickening. The common bile duct  is dilated to 8 mm (image 28/7). There is at least 1 distal stone in the common bile duct measuring approximately 2 mm on image 31/7). Additional stones or sludge or stones are evident on MRCP sequence image 1/series 14. Pancreas: No significant pancreatic duct dilatation. Small amount fluid along the body and tail the pancreas. The pancreatic parenchyma is normal signal intensity. Postcontrast imaging demonstrates uniform enhancement the pancreatic parenchyma. No organized fluid collections. Spleen: Normal spleen. Adrenals/urinary tract: Adrenal glands and kidneys are normal. Stomach/Bowel: Stomach and limited of the small bowel is unremarkable Vascular/Lymphatic: Abdominal aortic normal caliber. No retroperitoneal periportal lymphadenopathy. Musculoskeletal: No aggressive osseous lesion IMPRESSION: 1. Findings consistent with gallstone pancreatitis. 2. Several small filling defects within distal common bile duct consistent with choledocholithiasis. Mild common  bile duct dilatation. 3. Multiple gallstones layering in the lumen gallbladder of varying size. Mild gallbladder wall thickening and pericholecystic fluid. Recommend clinical correlation for acute cholecystitis. 4. Mild pancreatitis. No evidence of pancreatic necrosis. No organized fluid collections. These results will be called to the ordering clinician or representative by the Radiologist Assistant, and communication documented in the PACS or zVision Dashboard. Electronically Signed   By: Suzy Bouchard M.D.   On: 11/03/2017 10:29    Patient Profile     77 y.o. 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  Assessment & Plan    1 preoperative evaluation prior to cholecystectomy-as outlined patient had a recent negative nuclear study.  She may proceed with surgery without further cardiac evaluation.  2 history of recurrent DVT/pulmonary emboli-would resume heparin/Xarelto when okay with general surgery (after hemostasis achieved).  Will leave to surgery and primary care.  3 thoracic aortic aneurysm-patient will follow-up with Dr. Tamala Julian for this issue.  4 hypertension-would continue present blood pressure medications at discharge.  5 coronary artery disease-coronary calcium noted on CT scan.  Would discontinue aspirin given need for Xarelto long-term.  Continue statin.  No active cardiac issues [facility] will sign off.   Medication Recommendations: Continue preadmission medications at discharge other than aspirin. Other recommendations (labs, testing, etc): No other cardiac testing Follow up as an outpatient: Follow-up with Dr. Tamala Julian 3 months following discharge.  For questions or updates, please contact [place] Please consult [email or web address] for contact info under Cardiology/STEMI.      Signed, Kirk Ruths, MD  11/05/2017, 8:15 AM

## 2017-11-05 NOTE — Interval H&P Note (Signed)
History and Physical Interval Note:  11/05/2017 1:42 PM  Denise Cline  has presented today for surgery, with the diagnosis of pancreatitis  The various methods of treatment have been discussed with the patient and family. After consideration of risks, benefits and other options for treatment, the patient has consented to  Procedure(s): LAPAROSCOPIC CHOLECYSTECTOMY WITH INTRAOPERATIVE CHOLANGIOGRAM (N/A) as a surgical intervention .  The patient's history has been reviewed, patient examined, no change in status, stable for surgery.  I have reviewed the patient's chart and labs.  Questions were answered to the patient's satisfaction.     TOTH III,PAUL S

## 2017-11-05 NOTE — H&P (View-Only) (Signed)
1 Day Post-Op   Subjective/Chief Complaint: No complaints   Objective: Vital signs in last 24 hours: Temp:  [97.6 F (36.4 C)-98.4 F (36.9 C)] 98 F (36.7 C) (09/03 0442) Pulse Rate:  [64-87] 65 (09/03 0442) Resp:  [14-20] 18 (09/03 0442) BP: (141-164)/(64-99) 158/91 (09/03 0442) SpO2:  [97 %-100 %] 98 % (09/03 0442) Weight:  [90.6 kg] 90.6 kg (09/02 1159) Last BM Date: 11/04/17  Intake/Output from previous day: 09/02 0701 - 09/03 0700 In: 3255.6 [P.O.:180; I.V.:2527.6; IV Piggyback:548] Out: 1000 [Urine:1000] Intake/Output this shift: No intake/output data recorded.  General appearance: alert and cooperative Resp: clear to auscultation bilaterally Cardio: regular rate and rhythm GI: soft, non-tender; bowel sounds normal; no masses,  no organomegaly  Lab Results:  Recent Labs    11/04/17 0502 11/05/17 0439  WBC 4.6 4.5  HGB 10.7* 10.9*  HCT 32.1* 33.0*  PLT 275 269   BMET Recent Labs    11/04/17 0502 11/05/17 0439  NA 141 139  K 3.0* 3.6  CL 102 103  CO2 30 28  GLUCOSE 96 135*  BUN 10 12  CREATININE 1.06* 1.04*  CALCIUM 8.8* 8.8*   PT/INR No results for input(s): LABPROT, INR in the last 72 hours. ABG No results for input(s): PHART, HCO3 in the last 72 hours.  Invalid input(s): PCO2, PO2  Studies/Results: Mr 3d Recon At Scanner  Result Date: 11/03/2017 CLINICAL DATA:  Pancreatitis acute pancreatitis. EXAM: MRI ABDOMEN WITHOUT AND WITH CONTRAST (INCLUDING MRCP) TECHNIQUE: Multiplanar multisequence MR imaging of the abdomen was performed both before and after the administration of intravenous contrast. Heavily T2-weighted images of the biliary and pancreatic ducts were obtained, and three-dimensional MRCP images were rendered by post processing. CONTRAST:  43mL MULTIHANCE GADOBENATE DIMEGLUMINE 529 MG/ML IV SOLN COMPARISON:  CT 11/02/2017 FINDINGS: Lower chest:  Lung bases are clear. Hepatobiliary: No significant intrahepatic duct dilatation. No focal  hepatic lesion. There are multiple gallstones layering dependently within the gallbladder ranging size from 2 mm to 8 mm. There approximately 24 stones. There is mild pericholecystic fluid and gallbladder wall thickening. The common bile duct is dilated to 8 mm (image 28/7). There is at least 1 distal stone in the common bile duct measuring approximately 2 mm on image 31/7). Additional stones or sludge or stones are evident on MRCP sequence image 1/series 14. Pancreas: No significant pancreatic duct dilatation. Small amount fluid along the body and tail the pancreas. The pancreatic parenchyma is normal signal intensity. Postcontrast imaging demonstrates uniform enhancement the pancreatic parenchyma. No organized fluid collections. Spleen: Normal spleen. Adrenals/urinary tract: Adrenal glands and kidneys are normal. Stomach/Bowel: Stomach and limited of the small bowel is unremarkable Vascular/Lymphatic: Abdominal aortic normal caliber. No retroperitoneal periportal lymphadenopathy. Musculoskeletal: No aggressive osseous lesion IMPRESSION: 1. Findings consistent with gallstone pancreatitis. 2. Several small filling defects within distal common bile duct consistent with choledocholithiasis. Mild common bile duct dilatation. 3. Multiple gallstones layering in the lumen gallbladder of varying size. Mild gallbladder wall thickening and pericholecystic fluid. Recommend clinical correlation for acute cholecystitis. 4. Mild pancreatitis. No evidence of pancreatic necrosis. No organized fluid collections. These results will be called to the ordering clinician or representative by the Radiologist Assistant, and communication documented in the PACS or zVision Dashboard. Electronically Signed   By: Suzy Bouchard M.D.   On: 11/03/2017 10:29   Dg Ercp Biliary & Pancreatic Ducts  Result Date: 11/04/2017 CLINICAL DATA:  77 year old female with common bile duct obstruction EXAM: ERCP TECHNIQUE: Multiple spot images obtained  with the fluoroscopic device and submitted for interpretation post-procedure. FLUOROSCOPY TIME:  Fluoroscopy Time:  1 minutes 58 seconds COMPARISON:  MRCP 11/03/2017 FINDINGS: Two intraoperative spot images obtained during ERCP. The images demonstrate a flexible endoscope in the descending duodenum with deep wire cannulation of the common hepatic duct. The final image demonstrates partial cholangiogram with biliary ductal dilatation. IMPRESSION: ERCP. These images were submitted for radiologic interpretation only. Please see the procedural report for the amount of contrast and the fluoroscopy time utilized. Electronically Signed   By: Jacqulynn Cadet M.D.   On: 11/04/2017 13:20   Mr Abdomen Mrcp W Wo Contast  Result Date: 11/03/2017 CLINICAL DATA:  Pancreatitis acute pancreatitis. EXAM: MRI ABDOMEN WITHOUT AND WITH CONTRAST (INCLUDING MRCP) TECHNIQUE: Multiplanar multisequence MR imaging of the abdomen was performed both before and after the administration of intravenous contrast. Heavily T2-weighted images of the biliary and pancreatic ducts were obtained, and three-dimensional MRCP images were rendered by post processing. CONTRAST:  51mL MULTIHANCE GADOBENATE DIMEGLUMINE 529 MG/ML IV SOLN COMPARISON:  CT 11/02/2017 FINDINGS: Lower chest:  Lung bases are clear. Hepatobiliary: No significant intrahepatic duct dilatation. No focal hepatic lesion. There are multiple gallstones layering dependently within the gallbladder ranging size from 2 mm to 8 mm. There approximately 24 stones. There is mild pericholecystic fluid and gallbladder wall thickening. The common bile duct is dilated to 8 mm (image 28/7). There is at least 1 distal stone in the common bile duct measuring approximately 2 mm on image 31/7). Additional stones or sludge or stones are evident on MRCP sequence image 1/series 14. Pancreas: No significant pancreatic duct dilatation. Small amount fluid along the body and tail the pancreas. The pancreatic  parenchyma is normal signal intensity. Postcontrast imaging demonstrates uniform enhancement the pancreatic parenchyma. No organized fluid collections. Spleen: Normal spleen. Adrenals/urinary tract: Adrenal glands and kidneys are normal. Stomach/Bowel: Stomach and limited of the small bowel is unremarkable Vascular/Lymphatic: Abdominal aortic normal caliber. No retroperitoneal periportal lymphadenopathy. Musculoskeletal: No aggressive osseous lesion IMPRESSION: 1. Findings consistent with gallstone pancreatitis. 2. Several small filling defects within distal common bile duct consistent with choledocholithiasis. Mild common bile duct dilatation. 3. Multiple gallstones layering in the lumen gallbladder of varying size. Mild gallbladder wall thickening and pericholecystic fluid. Recommend clinical correlation for acute cholecystitis. 4. Mild pancreatitis. No evidence of pancreatic necrosis. No organized fluid collections. These results will be called to the ordering clinician or representative by the Radiologist Assistant, and communication documented in the PACS or zVision Dashboard. Electronically Signed   By: Suzy Bouchard M.D.   On: 11/03/2017 10:29    Anti-infectives: Anti-infectives (From admission, onward)   Start     Dose/Rate Route Frequency Ordered Stop   11/02/17 2300  ceFEPIme (MAXIPIME) 1 g in sodium chloride 0.9 % 100 mL IVPB     1 g 200 mL/hr over 30 Minutes Intravenous Every 12 hours 11/02/17 1531     11/02/17 1130  ceFEPIme (MAXIPIME) 2 g in sodium chloride 0.9 % 100 mL IVPB     2 g 200 mL/hr over 30 Minutes Intravenous  Once 11/02/17 1128 11/02/17 1335      Assessment/Plan: s/p Procedure(s): ENDOSCOPIC RETROGRADE CHOLANGIOPANCREATOGRAPHY (ERCP) Balloon Extraction (N/A) SPHINCTEROTOMY plan for lap chole today. Risks and benefits of the surgery as well as some of the technical aspects discussed with the patient including bleeding and bile duct injury and she understands and wishes  to proceed  LOS: 3 days    TOTH III,PAUL S 11/05/2017

## 2017-11-05 NOTE — Progress Notes (Signed)
Subjective: She was seen and examined at bedside. She denies nausea, vomiting or abdominal pain.she was able to tolerate full liquid diet yesterday and is nothing by mouth post midnight for cholecystectomy scheduled today  Objective: Vital signs in last 24 hours: Temp:  [97.6 F (36.4 C)-98.4 F (36.9 C)] 98 F (36.7 C) (09/03 0442) Pulse Rate:  [64-87] 65 (09/03 0442) Resp:  [14-20] 18 (09/03 0442) BP: (141-164)/(64-99) 158/91 (09/03 0442) SpO2:  [97 %-100 %] 98 % (09/03 0442) Weight:  [90.6 kg] 90.6 kg (09/02 1159) Weight change:  Last BM Date: 11/04/17  ZD:GUYQ pallor, no icterus GENERAL:not in acute distress, sitting comfortably on bed ABDOMEN:soft, nondistended, nontender, normoactive bowel sounds EXTREMITIES:no deformity  Lab Results: Results for orders placed or performed during the hospital encounter of 11/02/17 (from the past 48 hour(s))  Heparin level (unfractionated)     Status: Abnormal   Collection Time: 11/04/17  5:02 AM  Result Value Ref Range   Heparin Unfractionated <0.10 (L) 0.30 - 0.70 IU/mL    Comment: REPEATED TO VERIFY (NOTE) If heparin results are below expected values, and patient dosage has  been confirmed, suggest follow up testing of antithrombin III levels. Performed at [facility], [place] [address], [place], [place] [address]   APTT     Status: None   Collection Time: 11/04/17  5:02 AM  Result Value Ref Range   aPTT 36 24 - 36 seconds    Comment: Performed at [facility] [address], [place], [place] [address]  CBC     Status: Abnormal   Collection Time: 11/04/17  5:02 AM  Result Value Ref Range   WBC 4.6 4.0 - 10.5 K/uL   RBC 3.34 (L) 3.87 - 5.11 MIL/uL   Hemoglobin 10.7 (L) 12.0 - 15.0 g/dL   HCT 32.1 (L) 36.0 - 46.0 %   MCV 96.1 78.0 - 100.0 fL   MCH 32.0 26.0 - 34.0 pg   MCHC 33.3 30.0 - 36.0 g/dL   RDW 12.8 11.5 - 15.5 %   Platelets 275 150 - 400 K/uL    Comment: Performed at [facility] [address] [place], [place] [address]  Comprehensive metabolic panel     Status: Abnormal   Collection Time: 11/04/17  5:02 AM  Result Value Ref Range   Sodium 141 135 - 145 mmol/L   Potassium 3.0 (L) 3.5 - 5.1 mmol/L   Chloride 102 98 - 111 mmol/L   CO2 30 22 - 32 mmol/L   Glucose, Bld 96 70 - 99 mg/dL   BUN 10 8 - 23 mg/dL   Creatinine, Ser 1.06 (H) 0.44 - 1.00 mg/dL   Calcium 8.8 (L) 8.9 - 10.3 mg/dL   Total Protein 6.4 (L) 6.5 - 8.1 g/dL   Albumin 2.8 (L) 3.5 - 5.0 g/dL   AST 21 15 - 41 U/L   ALT 27 0 - 44 U/L   Alkaline Phosphatase 53 38 - 126 U/L   Total Bilirubin 0.7 0.3 - 1.2 mg/dL   GFR calc non Af Amer 49 (L) >60 mL/min   GFR calc Af Amer 56 (L) >60 mL/min    Comment: (NOTE) The eGFR has been calculated using the CKD EPI equation. This calculation has not been validated in all clinical situations. eGFR's persistently <60 mL/min signify possible Chronic Kidney Disease.    Anion gap 9 5 - 15    Comment: Performed at [facility] [address],  [place], [place] [address]  Lipase, blood     Status: None   Collection Time: 11/04/17  5:02 AM  Result Value Ref Range   Lipase 47 11 - 51 U/L    Comment: Performed at [facility] [address], [place], [place] [address]  Magnesium     Status: Abnormal   Collection Time: 11/04/17  5:02 AM  Result Value Ref Range   Magnesium 1.2 (L) 1.7 - 2.4 mg/dL    Comment: Performed at [facility] [address], [place], [place] [address]  Phosphorus     Status: None   Collection Time: 11/04/17  5:02 AM  Result Value Ref Range   Phosphorus 3.6 2.5 - 4.6 mg/dL    Comment: Performed at [facility] [address], [place], [place] [address]  Heparin level (unfractionated)     Status: None   Collection Time: 11/04/17 11:16 PM  Result Value Ref Range   Heparin Unfractionated 0.54 0.30 - 0.70 IU/mL    Comment: (NOTE) If heparin results are below expected values, and patient dosage has  been confirmed, suggest follow  up testing of antithrombin III levels. Performed at [facility], [place] [address], [place], [place] [address]   Heparin level (unfractionated)     Status: None   Collection Time: 11/05/17  4:39 AM  Result Value Ref Range   Heparin Unfractionated 0.68 0.30 - 0.70 IU/mL    Comment: (NOTE) If heparin results are below expected values, and patient dosage has  been confirmed, suggest follow up testing of antithrombin III levels. Performed at [facility], [place] [address] [place], [place] [address]   CBC with Differential/Platelet     Status: Abnormal   Collection Time: 11/05/17  4:39 AM  Result Value Ref Range   WBC 4.5 4.0 - 10.5 K/uL   RBC 3.43 (L) 3.87 - 5.11 MIL/uL   Hemoglobin 10.9 (L) 12.0 - 15.0 g/dL   HCT 33.0 (L) 36.0 - 46.0 %   MCV 96.2 78.0 - 100.0 fL   MCH 31.8 26.0 - 34.0 pg   MCHC 33.0 30.0 - 36.0 g/dL   RDW 12.7 11.5 - 15.5 %   Platelets 269 150 - 400 K/uL   Neutrophils Relative % 78 %   Neutro Abs 3.5 1.7 - 7.7 K/uL   Lymphocytes Relative 18 %   Lymphs Abs 0.8 0.7 - 4.0 K/uL   Monocytes Relative 4 %   Monocytes Absolute 0.2 0.1 - 1.0 K/uL   Eosinophils Relative 0 %   Eosinophils Absolute 0.0 0.0 - 0.7 K/uL   Basophils Relative 0 %   Basophils Absolute 0.0 0.0 - 0.1 K/uL   Immature Granulocytes 0 %   Abs Immature Granulocytes 0.0 0.0 - 0.1 K/uL    Comment: Performed at [facility], [address], [place],  [address]  Comprehensive metabolic panel     Status: Abnormal   Collection Time: 11/05/17  4:39 AM  Result Value Ref Range   Sodium 139 135 - 145 mmol/L   Potassium 3.6 3.5 - 5.1 mmol/L   Chloride 103 98 - 111 mmol/L   CO2 28 22 - 32 mmol/L   Glucose, Bld 135 (H) 70 - 99 mg/dL   BUN 12 8 - 23 mg/dL   Creatinine, Ser 1.04 (H) 0.44 - 1.00 mg/dL   Calcium 8.8 (L) 8.9 - 10.3 mg/dL   Total Protein 6.6 6.5 - 8.1 g/dL   Albumin  2.9 (L) 3.5 - 5.0 g/dL   AST 30 15 - 41 U/L   ALT 31 0 - 44 U/L   Alkaline Phosphatase 53 38 - 126 U/L    Total Bilirubin 0.7 0.3 - 1.2 mg/dL   GFR calc non Af Amer 50 (L) >60 mL/min   GFR calc Af Amer 58 (L) >60 mL/min    Comment: (NOTE) The eGFR has been calculated using the CKD EPI equation. This calculation has not been validated in all clinical situations. eGFR's persistently <60 mL/min signify possible Chronic Kidney Disease.    Anion gap 8 5 - 15    Comment: Performed at [facility] [address] [place], [place] [address]  Magnesium     Status: Abnormal   Collection Time: 11/05/17  4:39 AM  Result Value Ref Range   Magnesium 1.5 (L) 1.7 - 2.4 mg/dL    Comment: Performed at [facility] [address], [place], [place] [address]  Phosphorus     Status: None   Collection Time: 11/05/17  4:39 AM  Result Value Ref Range   Phosphorus 2.7 2.5 - 4.6 mg/dL    Comment: Performed at [facility] [address] [place], [place] [address]  TSH     Status: None   Collection Time: 11/05/17  4:39 AM  Result Value Ref Range   TSH 0.403 0.350 - 4.500 uIU/mL    Comment: Performed by a 3rd Generation assay with a functional sensitivity of <=0.01 uIU/mL. Performed at [facility], [place] [address] [place], [place] [address]     Studies/Results: Mr 3d Recon At Scanner  Result Date: 11/03/2017 CLINICAL DATA:  Pancreatitis acute pancreatitis. EXAM: MRI ABDOMEN WITHOUT AND WITH CONTRAST (INCLUDING MRCP) TECHNIQUE: Multiplanar multisequence MR imaging of the abdomen was performed both before and after the administration of intravenous contrast. Heavily T2-weighted images of the biliary and pancreatic ducts were obtained, and three-dimensional MRCP images were rendered by post processing. CONTRAST:  35m MULTIHANCE GADOBENATE DIMEGLUMINE 529 MG/ML IV SOLN COMPARISON:  CT 11/02/2017 FINDINGS: Lower chest:  Lung bases are clear. Hepatobiliary: No significant intrahepatic duct dilatation. No focal hepatic lesion. There are multiple gallstones layering dependently within the  gallbladder ranging size from 2 mm to 8 mm. There approximately 24 stones. There is mild pericholecystic fluid and gallbladder wall thickening. The common bile duct is dilated to 8 mm (image 28/7). There is at least 1 distal stone in the common bile duct measuring approximately 2 mm on image 31/7). Additional stones or sludge or stones are evident on MRCP sequence image 1/series 14. Pancreas: No significant pancreatic duct dilatation. Small amount fluid along the body and tail the pancreas. The pancreatic parenchyma is normal signal intensity. Postcontrast imaging demonstrates uniform enhancement the pancreatic parenchyma. No organized fluid collections. Spleen: Normal spleen. Adrenals/urinary tract: Adrenal glands and kidneys are normal. Stomach/Bowel: Stomach and limited of the small bowel is unremarkable Vascular/Lymphatic: Abdominal aortic normal caliber. No retroperitoneal periportal lymphadenopathy. Musculoskeletal: No aggressive osseous lesion IMPRESSION: 1. Findings consistent with gallstone pancreatitis. 2. Several small filling defects within distal common bile duct consistent with choledocholithiasis. Mild common bile duct dilatation. 3. Multiple gallstones layering in the lumen gallbladder of varying size. Mild gallbladder wall thickening and pericholecystic fluid. Recommend clinical correlation for acute cholecystitis. 4. Mild pancreatitis. No evidence of pancreatic necrosis. No organized fluid collections. These results will be called to the ordering clinician or representative by the Radiologist Assistant, and communication documented in the  PACS or zVision Dashboard. Electronically Signed   By: Suzy Bouchard M.D.   On: 11/03/2017 10:29   Dg Ercp Biliary & Pancreatic Ducts  Result Date: 11/04/2017 CLINICAL DATA:  77 year old female with common bile duct obstruction EXAM: ERCP TECHNIQUE: Multiple spot images obtained with the fluoroscopic device and submitted for interpretation post-procedure.  FLUOROSCOPY TIME:  Fluoroscopy Time:  1 minutes 58 seconds COMPARISON:  MRCP 11/03/2017 FINDINGS: Two intraoperative spot images obtained during ERCP. The images demonstrate a flexible endoscope in the descending duodenum with deep wire cannulation of the common hepatic duct. The final image demonstrates partial cholangiogram with biliary ductal dilatation. IMPRESSION: ERCP. These images were submitted for radiologic interpretation only. Please see the procedural report for the amount of contrast and the fluoroscopy time utilized. Electronically Signed   By: Jacqulynn Cadet M.D.   On: 11/04/2017 13:20   Mr Abdomen Mrcp W Wo Contast  Result Date: 11/03/2017 CLINICAL DATA:  Pancreatitis acute pancreatitis. EXAM: MRI ABDOMEN WITHOUT AND WITH CONTRAST (INCLUDING MRCP) TECHNIQUE: Multiplanar multisequence MR imaging of the abdomen was performed both before and after the administration of intravenous contrast. Heavily T2-weighted images of the biliary and pancreatic ducts were obtained, and three-dimensional MRCP images were rendered by post processing. CONTRAST:  39m MULTIHANCE GADOBENATE DIMEGLUMINE 529 MG/ML IV SOLN COMPARISON:  CT 11/02/2017 FINDINGS: Lower chest:  Lung bases are clear. Hepatobiliary: No significant intrahepatic duct dilatation. No focal hepatic lesion. There are multiple gallstones layering dependently within the gallbladder ranging size from 2 mm to 8 mm. There approximately 24 stones. There is mild pericholecystic fluid and gallbladder wall thickening. The common bile duct is dilated to 8 mm (image 28/7). There is at least 1 distal stone in the common bile duct measuring approximately 2 mm on image 31/7). Additional stones or sludge or stones are evident on MRCP sequence image 1/series 14. Pancreas: No significant pancreatic duct dilatation. Small amount fluid along the body and tail the pancreas. The pancreatic parenchyma is normal signal intensity. Postcontrast imaging demonstrates uniform  enhancement the pancreatic parenchyma. No organized fluid collections. Spleen: Normal spleen. Adrenals/urinary tract: Adrenal glands and kidneys are normal. Stomach/Bowel: Stomach and limited of the small bowel is unremarkable Vascular/Lymphatic: Abdominal aortic normal caliber. No retroperitoneal periportal lymphadenopathy. Musculoskeletal: No aggressive osseous lesion IMPRESSION: 1. Findings consistent with gallstone pancreatitis. 2. Several small filling defects within distal common bile duct consistent with choledocholithiasis. Mild common bile duct dilatation. 3. Multiple gallstones layering in the lumen gallbladder of varying size. Mild gallbladder wall thickening and pericholecystic fluid. Recommend clinical correlation for acute cholecystitis. 4. Mild pancreatitis. No evidence of pancreatic necrosis. No organized fluid collections. These results will be called to the ordering clinician or representative by the Radiologist Assistant, and communication documented in the PACS or zVision Dashboard. Electronically Signed   By: SSuzy BouchardM.D.   On: 11/03/2017 10:29    Medications: I have reviewed the patient's current medications.  Assessment: 1.Gallstone pancreatitis 2.post ERCP yesterday 3. Mild renal impairment, BUN12 /creatinine 1.04,GFR 58 4. History of pulmonary embolism and DVT 5. Ascending thoracic aortic aneurysm 6.coronary artery disease 7. Hypertension  Plan: Doing well. Recommend low-fat diet post op, to be advanced as tolerated and as per surgical recommendations. We will sign off, please recall GI if needed   ARonnette Juniper9/05/2017, 9:25 AM   Pager [phone] no answer or after 5 PM call [phone]

## 2017-11-05 NOTE — Op Note (Signed)
11/02/2017 - 11/05/2017  3:32 PM  PATIENT:  Denise Cline  77 y.o. female  PRE-OPERATIVE DIAGNOSIS:  Gallstone pancreatitis  POST-OPERATIVE DIAGNOSIS:  Gallstone pancreatitis  PROCEDURE:  Procedure(s): LAPAROSCOPIC CHOLECYSTECTOMY WITH INTRAOPERATIVE CHOLANGIOGRAM (N/A)  SURGEON:  Surgeon(s) and Role:    * Jovita Kussmaul, MD - Primary  PHYSICIAN ASSISTANT:   ASSISTANTS: Melina Modena, PA   ANESTHESIA:   local and general  EBL:  50 mL   BLOOD ADMINISTERED:none  DRAINS: none   LOCAL MEDICATIONS USED:  MARCAINE     SPECIMEN:  Source of Specimen:  gallbladder  DISPOSITION OF SPECIMEN:  PATHOLOGY  COUNTS:  YES  TOURNIQUET:  * No tourniquets in log *  DICTATION: .Dragon Dictation     Procedure: After informed consent was obtained the patient was brought to the operating room and placed in the supine position on the operating room table. After adequate induction of general anesthesia the patient's abdomen was prepped with ChloraPrep allowed to dry and draped in usual sterile manner. An appropriate timeout was performed. The area above the umbilicus was infiltrated with quarter percent  Marcaine. A small incision was made with a 15 blade knife. The incision was carried down through the subcutaneous tissue bluntly with a hemostat and [employer] retractors. The linea alba was identified. The linea alba was incised with a 15 blade knife and each side was grasped with Coker clamps. The preperitoneal space was then probed with a hemostat until the peritoneum was opened and access was gained to the abdominal cavity. A 0 Vicryl pursestring stitch was placed in the fascia surrounding the opening. A Hassan cannula was then placed through the opening and anchored in place with the previously placed Vicryl purse string stitch. The abdomen was insufflated with carbon dioxide without difficulty. A laparoscope was inserted through the [facility] cannula in the right upper quadrant was inspected. Next  the epigastric region was infiltrated with % Marcaine. A small incision was made with a 15 blade knife. A 5 mm port was placed bluntly through this incision into the abdominal cavity under direct vision. Next 2 sites were chosen laterally on the right side of the abdomen for placement of 5 mm ports. Each of these areas was infiltrated with quarter percent Marcaine. Small stab incisions were made with a 15 blade knife. 5 mm ports were then placed bluntly through these incisions into the abdominal cavity under direct vision without difficulty. A blunt grasper was placed through the lateralmost 5 mm port and used to grasp the dome of the gallbladder and elevated anteriorly and superiorly. Another blunt grasper was placed through the other 5 mm port and used to retract the body and neck of the gallbladder. A dissector was placed through the epigastric port and using the electrocautery the peritoneal reflection at the gallbladder neck was opened. Blunt dissection was then carried out in this area until the gallbladder neck-cystic duct junction was readily identified and a good window was created. A single clip was placed on the gallbladder neck. A small  ductotomy was made just below the clip with laparoscopic scissors. A 14-gauge Angiocath was then placed through the anterior abdominal wall under direct vision. A Reddick cholangiogram catheter was then placed through the Angiocath and flushed. The catheter was then placed in the cystic duct and anchored in place with a clip. A cholangiogram was obtained that showed no filling defects, there was no emptying into the duodenum but she had a sphincterotomy yesterday,and adequate length on the cystic duct.  The anchoring clip and catheters were then removed from the patient. 3 clips were placed proximally on the cystic duct and the duct was divided between the 2 sets of clips. Posterior to this the cystic artery was identified and again dissected bluntly in a circumferential  manner until a good window  was created. 2 clips were placed proximally and one distally on the artery and the artery was divided between the 2 sets of clips. Next a laparoscopic hook cautery device was used to separate the gallbladder from the liver bed. Prior to completely detaching the gallbladder from the liver bed the liver bed was inspected and several small bleeding points were coagulated with the electrocautery until the area was completely hemostatic. The gallbladder was then detached the rest of it from the liver bed without difficulty. A laparoscopic bag was inserted through the hassan port. The laparoscope was moved to the epigastric port. The gallbladder was placed within the bag and the bag was sealed.  The bag with the gallbladder was then removed with the [facility] cannula through the infraumbilical port without difficulty. The fascial defect was then closed with the previously placed Vicryl pursestring stitch as well as with another figure-of-eight 0 Vicryl stitch. The liver bed was inspected again and found to be hemostatic. The abdomen was irrigated with copious amounts of saline until the effluent was clear. The ports were then removed under direct vision without difficulty and were found to be hemostatic. The gas was allowed to escape. The skin incisions were all closed with interrupted 4-0 Monocryl subcuticular stitches. Dermabond dressings were applied. The patient tolerated the procedure well. At the end of the case all needle sponge and instrument counts were correct. The patient was then awakened and taken to recovery in stable condition  PLAN OF CARE: Admit to inpatient   PATIENT DISPOSITION:  PACU - hemodynamically stable.   Delay start of Pharmacological VTE agent (>24hrs) due to surgical blood loss or risk of bleeding: yes

## 2017-11-05 NOTE — Progress Notes (Signed)
1 Day Post-Op   Subjective/Chief Complaint: No complaints   Objective: Vital signs in last 24 hours: Temp:  [97.6 F (36.4 C)-98.4 F (36.9 C)] 98 F (36.7 C) (09/03 0442) Pulse Rate:  [64-87] 65 (09/03 0442) Resp:  [14-20] 18 (09/03 0442) BP: (141-164)/(64-99) 158/91 (09/03 0442) SpO2:  [97 %-100 %] 98 % (09/03 0442) Weight:  [90.6 kg] 90.6 kg (09/02 1159) Last BM Date: 11/04/17  Intake/Output from previous day: 09/02 0701 - 09/03 0700 In: 3255.6 [P.O.:180; I.V.:2527.6; IV Piggyback:548] Out: 1000 [Urine:1000] Intake/Output this shift: No intake/output data recorded.  General appearance: alert and cooperative Resp: clear to auscultation bilaterally Cardio: regular rate and rhythm GI: soft, non-tender; bowel sounds normal; no masses,  no organomegaly  Lab Results:  Recent Labs    11/04/17 0502 11/05/17 0439  WBC 4.6 4.5  HGB 10.7* 10.9*  HCT 32.1* 33.0*  PLT 275 269   BMET Recent Labs    11/04/17 0502 11/05/17 0439  NA 141 139  K 3.0* 3.6  CL 102 103  CO2 30 28  GLUCOSE 96 135*  BUN 10 12  CREATININE 1.06* 1.04*  CALCIUM 8.8* 8.8*   PT/INR No results for input(s): LABPROT, INR in the last 72 hours. ABG No results for input(s): PHART, HCO3 in the last 72 hours.  Invalid input(s): PCO2, PO2  Studies/Results: Mr 3d Recon At Scanner  Result Date: 11/03/2017 CLINICAL DATA:  Pancreatitis acute pancreatitis. EXAM: MRI ABDOMEN WITHOUT AND WITH CONTRAST (INCLUDING MRCP) TECHNIQUE: Multiplanar multisequence MR imaging of the abdomen was performed both before and after the administration of intravenous contrast. Heavily T2-weighted images of the biliary and pancreatic ducts were obtained, and three-dimensional MRCP images were rendered by post processing. CONTRAST:  15mL MULTIHANCE GADOBENATE DIMEGLUMINE 529 MG/ML IV SOLN COMPARISON:  CT 11/02/2017 FINDINGS: Lower chest:  Lung bases are clear. Hepatobiliary: No significant intrahepatic duct dilatation. No focal  hepatic lesion. There are multiple gallstones layering dependently within the gallbladder ranging size from 2 mm to 8 mm. There approximately 24 stones. There is mild pericholecystic fluid and gallbladder wall thickening. The common bile duct is dilated to 8 mm (image 28/7). There is at least 1 distal stone in the common bile duct measuring approximately 2 mm on image 31/7). Additional stones or sludge or stones are evident on MRCP sequence image 1/series 14. Pancreas: No significant pancreatic duct dilatation. Small amount fluid along the body and tail the pancreas. The pancreatic parenchyma is normal signal intensity. Postcontrast imaging demonstrates uniform enhancement the pancreatic parenchyma. No organized fluid collections. Spleen: Normal spleen. Adrenals/urinary tract: Adrenal glands and kidneys are normal. Stomach/Bowel: Stomach and limited of the small bowel is unremarkable Vascular/Lymphatic: Abdominal aortic normal caliber. No retroperitoneal periportal lymphadenopathy. Musculoskeletal: No aggressive osseous lesion IMPRESSION: 1. Findings consistent with gallstone pancreatitis. 2. Several small filling defects within distal common bile duct consistent with choledocholithiasis. Mild common bile duct dilatation. 3. Multiple gallstones layering in the lumen gallbladder of varying size. Mild gallbladder wall thickening and pericholecystic fluid. Recommend clinical correlation for acute cholecystitis. 4. Mild pancreatitis. No evidence of pancreatic necrosis. No organized fluid collections. These results will be called to the ordering clinician or representative by the Radiologist Assistant, and communication documented in the PACS or zVision Dashboard. Electronically Signed   By: Suzy Bouchard M.D.   On: 11/03/2017 10:29   Dg Ercp Biliary & Pancreatic Ducts  Result Date: 11/04/2017 CLINICAL DATA:  77 year old female with common bile duct obstruction EXAM: ERCP TECHNIQUE: Multiple spot images obtained  with the fluoroscopic device and submitted for interpretation post-procedure. FLUOROSCOPY TIME:  Fluoroscopy Time:  1 minutes 58 seconds COMPARISON:  MRCP 11/03/2017 FINDINGS: Two intraoperative spot images obtained during ERCP. The images demonstrate a flexible endoscope in the descending duodenum with deep wire cannulation of the common hepatic duct. The final image demonstrates partial cholangiogram with biliary ductal dilatation. IMPRESSION: ERCP. These images were submitted for radiologic interpretation only. Please see the procedural report for the amount of contrast and the fluoroscopy time utilized. Electronically Signed   By: Jacqulynn Cadet M.D.   On: 11/04/2017 13:20   Mr Abdomen Mrcp W Wo Contast  Result Date: 11/03/2017 CLINICAL DATA:  Pancreatitis acute pancreatitis. EXAM: MRI ABDOMEN WITHOUT AND WITH CONTRAST (INCLUDING MRCP) TECHNIQUE: Multiplanar multisequence MR imaging of the abdomen was performed both before and after the administration of intravenous contrast. Heavily T2-weighted images of the biliary and pancreatic ducts were obtained, and three-dimensional MRCP images were rendered by post processing. CONTRAST:  46mL MULTIHANCE GADOBENATE DIMEGLUMINE 529 MG/ML IV SOLN COMPARISON:  CT 11/02/2017 FINDINGS: Lower chest:  Lung bases are clear. Hepatobiliary: No significant intrahepatic duct dilatation. No focal hepatic lesion. There are multiple gallstones layering dependently within the gallbladder ranging size from 2 mm to 8 mm. There approximately 24 stones. There is mild pericholecystic fluid and gallbladder wall thickening. The common bile duct is dilated to 8 mm (image 28/7). There is at least 1 distal stone in the common bile duct measuring approximately 2 mm on image 31/7). Additional stones or sludge or stones are evident on MRCP sequence image 1/series 14. Pancreas: No significant pancreatic duct dilatation. Small amount fluid along the body and tail the pancreas. The pancreatic  parenchyma is normal signal intensity. Postcontrast imaging demonstrates uniform enhancement the pancreatic parenchyma. No organized fluid collections. Spleen: Normal spleen. Adrenals/urinary tract: Adrenal glands and kidneys are normal. Stomach/Bowel: Stomach and limited of the small bowel is unremarkable Vascular/Lymphatic: Abdominal aortic normal caliber. No retroperitoneal periportal lymphadenopathy. Musculoskeletal: No aggressive osseous lesion IMPRESSION: 1. Findings consistent with gallstone pancreatitis. 2. Several small filling defects within distal common bile duct consistent with choledocholithiasis. Mild common bile duct dilatation. 3. Multiple gallstones layering in the lumen gallbladder of varying size. Mild gallbladder wall thickening and pericholecystic fluid. Recommend clinical correlation for acute cholecystitis. 4. Mild pancreatitis. No evidence of pancreatic necrosis. No organized fluid collections. These results will be called to the ordering clinician or representative by the Radiologist Assistant, and communication documented in the PACS or zVision Dashboard. Electronically Signed   By: Suzy Bouchard M.D.   On: 11/03/2017 10:29    Anti-infectives: Anti-infectives (From admission, onward)   Start     Dose/Rate Route Frequency Ordered Stop   11/02/17 2300  ceFEPIme (MAXIPIME) 1 g in sodium chloride 0.9 % 100 mL IVPB     1 g 200 mL/hr over 30 Minutes Intravenous Every 12 hours 11/02/17 1531     11/02/17 1130  ceFEPIme (MAXIPIME) 2 g in sodium chloride 0.9 % 100 mL IVPB     2 g 200 mL/hr over 30 Minutes Intravenous  Once 11/02/17 1128 11/02/17 1335      Assessment/Plan: s/p Procedure(s): ENDOSCOPIC RETROGRADE CHOLANGIOPANCREATOGRAPHY (ERCP) Balloon Extraction (N/A) SPHINCTEROTOMY plan for lap chole today. Risks and benefits of the surgery as well as some of the technical aspects discussed with the patient including bleeding and bile duct injury and she understands and wishes  to proceed  LOS: 3 days    TOTH III,PAUL S 11/05/2017

## 2017-11-05 NOTE — Progress Notes (Signed)
Responded to consult from RN.  Supported pt and prayed with her.  Pt will attempt to visit pt tomorrow post-op if emergencies do not arise.  Myra Gianotti resident, [phone]

## 2017-11-05 NOTE — Progress Notes (Signed)
PROGRESS NOTE    Denise Cline  YKZ:[id number] DOB: [birth year] DOA: 11/02/2017 PCP: Josetta Huddle, MD   Brief Narrative:  Denise Cline is a 77 y.o. female with medical history significant of peripheral vascular disease, thyroid nodules, mild COPD, thoracic aortic aneurysm disease (4.5 cm noted on CT scan in 2016 with CT scan in August 2019 showing no change), recent episodes of back pain shoulder pain and abdominal pain who presents the emergency department complaining of abdominal swelling and nausea and epigastric abdominal pain. Found to have Gallstone pancreatitis and choledocholithiasis with possible Acute Cholecystitis. Patient underwent ERCP yesterday AM and then have Cholecystectomy today per General Surgery.   Assessment & Plan:   Principal Problem:   Choledocholithiasis with acute cholecystitis with obstruction Active Problems:   Hypertension   Thyroid disease   PVD (peripheral vascular disease) (HCC)   COPD (chronic obstructive pulmonary disease) (HCC)   Thoracic aortic aneurysm (HCC)   Chronic kidney disease, stage 3 (HCC)  Choledocholithiasis with Acute Cholecystitis with Obstruction with likley passed stones  -Admitted to [facility] -Patient on clear liquid diets and n.p.o. at midnight for ERCP and possible cholecystectomy to follow over the following day pending surgeon availability -Cardiology consulted for preoperative evaluation and she has been cleared and recommend that she may proceed with the Surgery without further Cardiac Evaluation  -Continue with IV fluid hydration -MRCP showed Findings consistent with gallstone pancreatitis. Several small filling defects within distal common bile duct consistent with choledocholithiasis. Mild common bile duct dilatation. Multiple gallstones layering in the lumen gallbladder of varying size. Mild gallbladder wall thickening and pericholecystic fluid. Recommend clinical correlation for acute cholecystitis. Mild pancreatitis.  No evidence of pancreatic necrosis. No organized fluid collections. -Patient underwent ERCP this AM and Cholangiogram did not reveal obvious filling defects and a sphincterotomy was preformed and CBD was swept with a balloon and nothing was able to be retrieved indicating likely passage of CBD stones.  -Patient started on IV Cefepime for possibly infected gallbladder given her current situation and will continue empirically for now  -IVF now discontinued  -Pain control with IV morphine 2 mg every 2 PRN for moderate pain, Robaxin 5 mg p.o. daily as needed muscle spasms -Continue with Antiemetics with Zofran 4 mg p.o./IV every 6 as needed -Is NPO for surgery and GI recommending Low Fat Diet Post-Op when tolerated -Resume Heparin gtt and hold prior to Surgery -GI Signed off Case  -Patient to go for Cholecystectomy later today   Mild Gallstone Pancreatitis with Choledocholithiasis -MRCP Findings consistent with gallstone pancreatitis. Several small filling defects within distal common bile duct consistent with choledocholithiasis. Mild common bile duct dilatation. Multiple gallstones layering in the lumen gallbladder of varying size. Mild gallbladder wall thickening and pericholecystic fluid. Mild pancreatitis. No evidence of pancreatic necrosis. No organized fluid collections. -Lipase Level on Admission was 1701 and is now normal at 47 -Treat with IV fluid hydration as above -Patient underwent ERCP this AM and as above nothing was retrieved likely indicating passage of stones -Lipase now normalized  -Follow-up on gastroenterology recommendations and resumed IV heparin yesterday and hold again for cholecystectomy today; Resume Anticoagulation when ok with General Surgery per Cardiology Recc's -NO Currently   Peripheral Vascular Disease -Noted  -Patient also with thoracic aortic aneurysm as below.  Thoracic Aortic Aneurysm - 4.5 cm and unchanged since 2016: Patient will continue with planned CT  scan which is scheduled for 6 months from October 08, 2017. -Cardiology consulted for further evaluation recommendations and recommending  follow up with Dr. Tamala Julian as an outpatient for this isssue   AKI on Chronic kidney disease stage III -Improved -Continue home losartan 100 mg p.o. daily but will discontinue hydrochlorothiazide as patient being rehydrated -Creatinine is at 1.7 on admission. Is now improved to 1.04 -Avoid nephrotoxic medications if possible and repeat CMP in the a.m.  Hyperlipidemia -Recent lipid panel showed a cholesterol level of 226, HDL 71, LDL of 133, TG of 78 and VLDL of 16 -Continue Rosuvastatin 20 mg p.o. nightly  Hypertension -BP was 143/76 -Continue home blood pressure medications but will hold Home HCTZ currently but resume at /C   Thyroid Nodules -Has history of surgical removal -We will be checking TSH and pending   COPD -Long history of asthma which she grew out of she does occasionally use an inhaler although she does not recognize a diagnosis of COPD.  History of DVT and Pulmonary Embolism -Patient is on Xarelto on a daily basis with last dose Thursday night -Started on heparin drip and will continue and Hold prior to Cholecystectomy   Sinus Bradycardia, improved -Cardiology consulted and staying bradycardia makes beta-blocker challenging -Slightly bradycardic yesterday but improved and is ranging HR between 64-87 -Currently blood pressures at goal and will follow  Hypokalemia -Patient is potassium level this morning was 3.6 -Continue to monitor and replete as necessary -Repeat CMP in a.m.  Hypomagnesemia -Patient magnesium levels 1.5 this morning -Replete with IV mag sulfate 3 g again  -Continue monitor and replete as necessary -Repeat magnesium level in a.m.  CAD -Per Cardiology -Coronary Calcium noted on CT Scan -Will D/C Aspirin per Cardiology Reccs and Continue Statin   DVT prophylaxis: Anticoagulated with Heparin gtt  currently but resume Xarelto when ok with Surgery  Code Status: FULL CODE Family Communication: No family present at bedside Disposition Plan: Remain Inpatient for Cholecystectomy and Anticipate D/C in the next 24-48 hours pending surgical Clearance   Consultants:   Gastroenterology (signed off)  General Surgery  Cardiology (signed off)    Procedures:  MRCP  ERCP Findings:      The scout film was normal. The esophagus was successfully intubated       under direct vision. The scope was advanced to a normal major papilla in       the descending duodenum without detailed examination of the pharynx,       larynx and associated structures, and upper GI tract. The upper GI tract       was grossly normal. A small diverticulum was noted adjacent to the       ampulla. The bile duct was deeply cannulated with the sphincterotome       with ease. Contrast was injected. I personally interpreted the bile duct       images. There was brisk flow of contrast through the ducts. Image       quality was excellent. Contrast extended to the entire biliary tree. The       intra-hepatic and extra-hepatic biliary duct system was normal, no       obvious filling defect noted. A straight Roadrunner wire was passed into       the biliary tree. A 10 mm biliary sphincterotomy was made with a braided       sphincterotome using ERBE electrocautery. The sphincterotomy oozed       blood. The biliary tree was swept with a 12 mm balloon starting at the       bifurcation several times. Nothing was found.  The pancreatic duct was never injected or canulated during the entire       procedure. Impression:               - The cholangiogram was normal.                           - A biliary sphincterotomy was performed.                           - The biliary tree was swept and nothing was found.                            Patient likely had passed CBD stones noted on MRCP,                            her liver  enzymes have normallized.   Findings: A small diverticulum was noted adjacent and to the ampulla. CBD was deeply cannulated with ease. Cholangiogram did not reveal any obvious filling defects. A 10 mm sphincterotomy was performed,with minimal self-limited oozing. A 12 mm balloon was used to sweep the CBD starting from the bifurcation. Nothing was retrieved. The patient had likely spontaneously passed CBD stones, also reflected by LFTs that have normalized.  Cholecystectomy (Report pending)   Antimicrobials:  Anti-infectives (From admission, onward)   Start     Dose/Rate Route Frequency Ordered Stop   11/02/17 2300  [MAR Hold]  ceFEPIme (MAXIPIME) 1 g in sodium chloride 0.9 % 100 mL IVPB     (MAR Hold since Tue 11/05/2017 at 1231. Reason: Transfer to a Procedural area.)   1 g 200 mL/hr over 30 Minutes Intravenous Every 12 hours 11/02/17 1531     11/02/17 1130  ceFEPIme (MAXIPIME) 2 g in sodium chloride 0.9 % 100 mL IVPB     2 g 200 mL/hr over 30 Minutes Intravenous  Once 11/02/17 1128 11/02/17 1335     Subjective: Seen and examined at bedside prior to Cholecystectomy and states she is very anxious and did not sleep very well last night because she is concerned about the surgery.  No chest pain, lightheadedness or dizziness.  Says that her abdominal pain improved and that she "passed her baby".  No other concerns or complaints at this time and awaiting surgical intervention.  Objective: Vitals:   11/05/17 0217 11/05/17 0442 11/05/17 1100 11/05/17 1223  BP: (!) 153/82 (!) 158/91 (!) 166/90 124/83  Pulse: 69 65  74  Resp: 18 18  18   Temp: 98.4 F (36.9 C) 98 F (36.7 C)  98.2 F (36.8 C)  TempSrc: Oral Oral  Axillary  SpO2: 97% 98%  98%  Weight:      Height:        Intake/Output Summary (Last 24 hours) at 11/05/2017 1510 Last data filed at 11/05/2017 1445 Gross per 24 hour  Intake 1442.11 ml  Output 1050 ml  Net 392.11 ml   Filed Weights   11/02/17 1413 11/04/17 1159    Weight: 90.6 kg 90.6 kg   Examination: Physical Exam:  Constitutional: Alert, well-developed obese African-American female is currently no acute distress but is very anxious Eyes: Sclera anicteric.  Lids and conjunctive are normal ENMT: External ears and nose appear normal.  Grossly normal hearing Neck: Appears supple no JVD. Respiratory: Diminished to auscultation bilaterally no appreciable  wheezing, rales, rhonchi.  Patient was not tachypneic using accessory muscle breathe Cardiovascular: Regular rate and rhythm.  No appreciable murmurs, rubs, gallops.  Slight extremity edema noted in the left compared to right Abdomen: Soft, slightly tender to palpate but not as bad.  Slightly distended secondary body habitus.  Bowel sounds present x4 GU: Deferred Musculoskeletal: No contractures cyanosis.  No joint deformities noted Skin: Warm and dry no appreciable rashes or lesions limited skin evaluation Neurologic: Cranial nerves II through XII grossly intact no appreciable focal deficits  Psychiatric:  Patient is awake, alert and oriented x3.  Patient is anxious today as normal affect.  Data Reviewed: I have personally reviewed following labs and imaging studies  CBC: Recent Labs  Lab 11/02/17 0748 11/02/17 0802 11/03/17 0604 11/04/17 0502 11/05/17 0439  WBC 6.3  --  6.0 4.6 4.5  NEUTROABS  --   --   --   --  3.5  HGB 12.1 12.2 10.1* 10.7* 10.9*  HCT 37.5 36.0 32.1* 32.1* 33.0*  MCV 100.3*  --  102.2* 96.1 96.2  PLT 249  --  257 275 211   Basic Metabolic Panel: Recent Labs  Lab 11/02/17 0748 11/02/17 0802 11/03/17 0604 11/04/17 0502 11/05/17 0439  NA 140 140 142 141 139  K 3.4* 3.5 3.5 3.0* 3.6  CL 103 100 103 102 103  CO2 27  --  25 30 28   GLUCOSE 111* 107* 88 96 135*  BUN 28* 35* 18 10 12   CREATININE 1.64* 1.70* 1.14* 1.06* 1.04*  CALCIUM 8.8*  --  8.5* 8.8* 8.8*  MG  --   --   --  1.2* 1.5*  PHOS  --   --   --  3.6 2.7   GFR: Estimated Creatinine Clearance: 49.7  mL/min (A) (by C-G formula based on SCr of 1.04 mg/dL (H)). Liver Function Tests: Recent Labs  Lab 11/02/17 0748 11/04/17 0502 11/05/17 0439  AST 30 21 30   ALT 35 27 31  ALKPHOS 58 53 53  BILITOT 0.8 0.7 0.7  PROT 6.6 6.4* 6.6  ALBUMIN 3.2* 2.8* 2.9*   Recent Labs  Lab 11/02/17 0748 11/03/17 0604 11/04/17 0502  LIPASE 1,701* 105* 47   No results for input(s): AMMONIA in the last 168 hours. Coagulation Profile: No results for input(s): INR, PROTIME in the last 168 hours. Cardiac Enzymes: No results for input(s): CKTOTAL, CKMB, CKMBINDEX, TROPONINI in the last 168 hours. BNP (last 3 results) No results for input(s): PROBNP in the last 8760 hours. HbA1C: No results for input(s): HGBA1C in the last 72 hours. CBG: No results for input(s): GLUCAP in the last 168 hours. Lipid Profile: No results for input(s): CHOL, HDL, LDLCALC, TRIG, CHOLHDL, LDLDIRECT in the last 72 hours. Thyroid Function Tests: Recent Labs    11/05/17 0439  TSH 0.403   Anemia Panel: No results for input(s): VITAMINB12, FOLATE, FERRITIN, TIBC, IRON, RETICCTPCT in the last 72 hours. Sepsis Labs: Recent Labs  Lab 11/02/17 0803 11/02/17 1202  LATICACIDVEN 1.49 0.78    Recent Results (from the past 240 hour(s))  MRSA PCR Screening     Status: None   Collection Time: 11/05/17  8:38 AM  Result Value Ref Range Status   MRSA by PCR NEGATIVE NEGATIVE Final    Comment:        The GeneXpert MRSA Assay (FDA approved for NASAL specimens only), is one component of a comprehensive MRSA colonization surveillance program. It is not intended to diagnose MRSA infection nor to guide  or monitor treatment for MRSA infections. Performed at [facility], [place] [address], [place], [place] [address]     Radiology Studies: Dg Ercp Biliary & Pancreatic Ducts  Result Date: 11/04/2017 CLINICAL DATA:  77 year old female with common bile duct obstruction EXAM: ERCP TECHNIQUE: Multiple spot images obtained with  the fluoroscopic device and submitted for interpretation post-procedure. FLUOROSCOPY TIME:  Fluoroscopy Time:  1 minutes 58 seconds COMPARISON:  MRCP 11/03/2017 FINDINGS: Two intraoperative spot images obtained during ERCP. The images demonstrate a flexible endoscope in the descending duodenum with deep wire cannulation of the common hepatic duct. The final image demonstrates partial cholangiogram with biliary ductal dilatation. IMPRESSION: ERCP. These images were submitted for radiologic interpretation only. Please see the procedural report for the amount of contrast and the fluoroscopy time utilized. Electronically Signed   By: Jacqulynn Cadet M.D.   On: 11/04/2017 13:20   Scheduled Meds: . [MAR Hold] aspirin EC  81 mg Oral Q M,W,F  . [MAR Hold] buPROPion  150 mg Oral Daily  . [MAR Hold] calcium-vitamin D  1 tablet Oral Q breakfast  . [MAR Hold] losartan  100 mg Oral Daily  . [MAR Hold] multivitamin with minerals  1 tablet Oral Daily  . [MAR Hold] pantoprazole  40 mg Oral Daily  . [MAR Hold] polyvinyl alcohol  1 drop Both Eyes TID  . [MAR Hold] rosuvastatin  20 mg Oral Daily   Continuous Infusions: . [MAR Hold] ceFEPime (MAXIPIME) IV 1 g (11/05/17 1006)    LOS: 3 days    Kerney Elbe, DO [facility] PAGER is on [place]  If 7PM-7AM, please contact night-coverage [email or web address] Password [email or web address] 11/05/2017, 3:10 PM

## 2017-11-05 NOTE — Anesthesia Preprocedure Evaluation (Signed)
Anesthesia Evaluation  Patient identified by MRN, date of birth, ID band Patient awake    Reviewed: Allergy & Precautions, H&P , NPO status , Patient's Chart, lab work & pertinent test results  Airway Mallampati: II   Neck ROM: full    Dental   Pulmonary shortness of breath, COPD, former smoker,  80 pack years   breath sounds clear to auscultation       Cardiovascular hypertension, + Peripheral Vascular Disease and + DVT   Rhythm:regular Rate:Normal     Neuro/Psych PSYCHIATRIC DISORDERS Anxiety    GI/Hepatic   Endo/Other    Renal/GU      Musculoskeletal   Abdominal   Peds  Hematology  (+) anemia ,   Anesthesia Other Findings   Reproductive/Obstetrics                             Anesthesia Physical Anesthesia Plan  ASA: III  Anesthesia Plan: General   Post-op Pain Management:    Induction: Intravenous  PONV Risk Score and Plan: 3 and Ondansetron, Dexamethasone, Midazolam and Treatment may vary due to age or medical condition  Airway Management Planned: Oral ETT  Additional Equipment:   Intra-op Plan:   Post-operative Plan: Extubation in OR  Informed Consent: I have reviewed the patients History and Physical, chart, labs and discussed the procedure including the risks, benefits and alternatives for the proposed anesthesia with the patient or authorized representative who has indicated his/her understanding and acceptance.     Plan Discussed with: CRNA, Anesthesiologist and Surgeon  Anesthesia Plan Comments:         Anesthesia Quick Evaluation

## 2017-11-05 NOTE — Social Work (Signed)
CSW acknowledging consult for SNF placement, pt will need PT/OT evaluations and insurance authorization prior to discharge.   Alexander Mt, [place] Work [phone]

## 2017-11-05 NOTE — Transfer of Care (Signed)
Immediate Anesthesia Transfer of Care Note  Patient: Denise Cline  Procedure(s) Performed: LAPAROSCOPIC CHOLECYSTECTOMY WITH INTRAOPERATIVE CHOLANGIOGRAM (N/A Abdomen)  Patient Location: PACU  Anesthesia Type:General  Level of Consciousness: awake and patient cooperative  Airway & Oxygen Therapy: Patient Spontanous Breathing  Post-op Assessment: Report given to RN and Post -op Vital signs reviewed and stable  Post vital signs: Reviewed and stable  Last Vitals:  Vitals Value Taken Time  BP 134/79 11/05/2017  3:59 PM  Temp    Pulse 74 11/05/2017  4:00 PM  Resp 14 11/05/2017  4:00 PM  SpO2 100 % 11/05/2017  4:00 PM  Vitals shown include unvalidated device data.  Last Pain:  Vitals:   11/05/17 1223  TempSrc: Axillary  PainSc:          Complications: No apparent anesthesia complications

## 2017-11-05 NOTE — Plan of Care (Signed)

## 2017-11-05 NOTE — Care Management Important Message (Signed)
Important Message  Patient Details  Name: Denise Cline MRN: [id number] Date of Birth: 07-23-1940   Medicare Important Message Given:  Yes    Salmaan Patchin [address] 11/05/2017, 4:13 PM

## 2017-11-05 NOTE — Anesthesia Procedure Notes (Signed)

## 2017-11-06 ENCOUNTER — Encounter (HOSPITAL_COMMUNITY): Payer: Self-pay | Admitting: Physician Assistant

## 2017-11-06 DIAGNOSIS — N183 Chronic kidney disease, stage 3 (moderate): Secondary | ICD-10-CM

## 2017-11-06 DIAGNOSIS — I739 Peripheral vascular disease, unspecified: Secondary | ICD-10-CM

## 2017-11-06 DIAGNOSIS — K851 Biliary acute pancreatitis without necrosis or infection: Secondary | ICD-10-CM

## 2017-11-06 LAB — COMPREHENSIVE METABOLIC PANEL
ALBUMIN: 3.2 g/dL — AB (ref 3.5–5.0)
ALT: UNDETERMINED U/L (ref 0–44)
AST: UNDETERMINED U/L (ref 15–41)
Alkaline Phosphatase: 75 U/L (ref 38–126)
Anion gap: 16 — ABNORMAL HIGH (ref 5–15)
BILIRUBIN TOTAL: UNDETERMINED mg/dL (ref 0.3–1.2)
BUN: 14 mg/dL (ref 8–23)
CO2: 23 mmol/L (ref 22–32)
Calcium: 9.8 mg/dL (ref 8.9–10.3)
Chloride: 101 mmol/L (ref 98–111)
Creatinine, Ser: 1.26 mg/dL — ABNORMAL HIGH (ref 0.44–1.00)
GFR, EST AFRICAN AMERICAN: 46 mL/min — AB (ref >60)
GFR, EST NON AFRICAN AMERICAN: 39 mL/min — AB (ref >60)
Glucose, Bld: 106 mg/dL — ABNORMAL HIGH (ref 70–99)
POTASSIUM: 4.4 mmol/L (ref 3.5–5.1)
SODIUM: 140 mmol/L (ref 135–145)
TOTAL PROTEIN: 6.8 g/dL (ref 6.5–8.1)

## 2017-11-06 LAB — CBC WITH DIFFERENTIAL/PLATELET
Abs Immature Granulocytes: 0 10*3/uL (ref 0.0–0.1)
BASOS ABS: 0 10*3/uL (ref 0.0–0.1)
Basophils Relative: 0 %
EOS PCT: 0 %
Eosinophils Absolute: 0 10*3/uL (ref 0.0–0.7)
HCT: 35.2 % — ABNORMAL LOW (ref 36.0–46.0)
Hemoglobin: 11.5 g/dL — ABNORMAL LOW (ref 12.0–15.0)
Immature Granulocytes: 1 %
Lymphocytes Relative: 15 %
Lymphs Abs: 1.3 10*3/uL (ref 0.7–4.0)
MCH: 32 pg (ref 26.0–34.0)
MCHC: 32.7 g/dL (ref 30.0–36.0)
MCV: 98.1 fL (ref 78.0–100.0)
MONO ABS: 0.6 10*3/uL (ref 0.1–1.0)
Monocytes Relative: 7 %
Neutro Abs: 6.5 10*3/uL (ref 1.7–7.7)
Neutrophils Relative %: 77 %
Platelets: UNDETERMINED 10*3/uL (ref 150–400)
RBC: 3.59 MIL/uL — AB (ref 3.87–5.11)
RDW: 13.3 % (ref 11.5–15.5)
WBC: 8.5 10*3/uL (ref 4.0–10.5)

## 2017-11-06 LAB — HEPATIC FUNCTION PANEL
ALK PHOS: 78 U/L (ref 38–126)
ALT: 110 U/L — ABNORMAL HIGH (ref 0–44)
AST: 127 U/L — ABNORMAL HIGH (ref 15–41)
Albumin: 3.3 g/dL — ABNORMAL LOW (ref 3.5–5.0)
BILIRUBIN DIRECT: 0.2 mg/dL (ref 0.0–0.2)
BILIRUBIN INDIRECT: 0.5 mg/dL (ref 0.3–0.9)
Total Bilirubin: 0.7 mg/dL (ref 0.3–1.2)
Total Protein: 7.5 g/dL (ref 6.5–8.1)

## 2017-11-06 LAB — HEPARIN LEVEL (UNFRACTIONATED): Heparin Unfractionated: 0.31 IU/mL (ref 0.30–0.70)

## 2017-11-06 LAB — MAGNESIUM
MAGNESIUM: UNDETERMINED mg/dL (ref 1.7–2.4)
MAGNESIUM: 1.7 mg/dL (ref 1.7–2.4)

## 2017-11-06 LAB — PHOSPHORUS: Phosphorus: 2.5 mg/dL (ref 2.5–4.6)

## 2017-11-06 MED ORDER — HEPARIN (PORCINE) IN NACL 100-0.45 UNIT/ML-% IJ SOLN
1000.0000 [IU]/h | INTRAMUSCULAR | Status: AC
  Administered 2017-11-06 – 2017-11-07 (×2): 1000 [IU]/h via INTRAVENOUS
  Filled 2017-11-06 (×3): qty 250

## 2017-11-06 NOTE — Social Work (Signed)
No PT follow up recommended.   CSW signing off. Please consult if any additional needs arise.  Alexander Mt, [place] Work [phone]

## 2017-11-06 NOTE — Care Management Note (Addendum)
Case Management Note  Patient Details  Name: Denise Cline MRN: [id number] Date of Birth: August 17, 1940  Subjective/Objective:                 9/3 LAPAROSCOPIC CHOLECYSTECTOMY WITH INTRAOPERATIVE CHOLANGIOGRAM    Action/Plan:  11:16 Independent patient from home w relatives. Upon assessment, patient walking into bathroom unassisted. No home needs identified at this time.  CM will continue to follow.  15:00 Spoke w patient and sister at bedside. Patient would like rollator, 3/1 for home. She has worked w PT who does not suggest any HH needs. Will place order for 3/1 and rollator and request delivery to room from [facility].   Expected Discharge Date:  11/06/17               Expected Discharge Plan:  Home/Self Care  In-House Referral:     Discharge planning Services  CM Consult  Post Acute Care Choice:    Choice offered to:     DME Arranged:    DME Agency:     HH Arranged:    HH Agency:     Status of Service:  Completed, signed off  If discussed at [employer] of Stay Meetings, dates discussed:    Additional Comments:  Carles Collet, RN 11/06/2017, 11:16 AM

## 2017-11-06 NOTE — Progress Notes (Signed)
[facility] Progress Note  1 Day Post-Op  Subjective: CC: No BM Patient without flatus or BM yet. Denies nausea or abdominal distention. Having some soreness in abdomen but pain well controlled. Patient concerned about going home on her own because she lives alone and is worried she will have trouble getting in/out of her truck.   Objective: Vital signs in last 24 hours: Temp:  [97.5 F (36.4 C)-98.6 F (37 C)] 98.6 F (37 C) (09/04 0512) Pulse Rate:  [58-74] 58 (09/04 0512) Resp:  [14-20] 18 (09/04 0512) BP: (124-166)/(79-103) 161/90 (09/04 0512) SpO2:  [96 %-100 %] 98 % (09/04 0512) Last BM Date: 11/05/17  Intake/Output from previous day: 09/03 0701 - 09/04 0700 In: 1000 [I.V.:1000] Out: 50 [Blood:50] Intake/Output this shift: No intake/output data recorded.  PE: Gen:  Alert, NAD, pleasant Card:  Regular rate and rhythm, pedal pulses 2+ BL Pulm:  Normal effort, clear to auscultation bilaterally Abd: Soft, non-tender, non-distended, bowel sounds present, no HSM, incisions C/D/I Skin: warm and dry, no rashes  Psych: A&Ox3   Lab Results:  Recent Labs    11/05/17 0439 11/06/17 0635  WBC 4.5 8.5  HGB 10.9* 11.5*  HCT 33.0* 35.2*  PLT 269 PENDING   BMET Recent Labs    11/05/17 0439 11/06/17 0635  NA 139 140  K 3.6 4.4  CL 103 101  CO2 28 23  GLUCOSE 135* 106*  BUN 12 14  CREATININE 1.04* 1.26*  CALCIUM 8.8* 9.8   PT/INR No results for input(s): LABPROT, INR in the last 72 hours. CMP     Component Value Date/Time   NA 140 11/06/2017 0635   K 4.4 11/06/2017 0635   CL 101 11/06/2017 0635   CO2 23 11/06/2017 0635   GLUCOSE 106 (H) 11/06/2017 0635   BUN 14 11/06/2017 0635   CREATININE 1.26 (H) 11/06/2017 0635   CALCIUM 9.8 11/06/2017 0635   PROT 6.8 11/06/2017 0635   ALBUMIN 3.2 (L) 11/06/2017 0635   AST QUANTITY NOT SUFFICIENT, UNABLE TO PERFORM TEST 11/06/2017 0635   ALT QUANTITY NOT SUFFICIENT, UNABLE TO PERFORM TEST 11/06/2017 0635    ALKPHOS 75 11/06/2017 0635   BILITOT QUANTITY NOT SUFFICIENT, UNABLE TO PERFORM TEST 11/06/2017 0635   GFRNONAA 39 (L) 11/06/2017 0635   GFRAA 46 (L) 11/06/2017 0635   Lipase     Component Value Date/Time   LIPASE 47 11/04/2017 0502       Studies/Results: Dg Cholangiogram Operative  Result Date: 11/05/2017 CLINICAL DATA:  77 year old female status post laparoscopic cholecystectomy with intraoperative cholangiogram EXAM: INTRAOPERATIVE CHOLANGIOGRAM TECHNIQUE: Cholangiographic images from the C-arm fluoroscopic device were submitted for interpretation post-operatively. Please see the procedural report for the amount of contrast and the fluoroscopy time utilized. COMPARISON:  ERCP 11/04/2017; MRCP 11/03/2017 FINDINGS: Cine loop and single intraoperative saved image obtained during intraoperative cholangiogram at the time of laparoscopic cholecystectomy. The images demonstrate cannulation of the cystic duct remanent and opacification of the biliary tree. There is moderate biliary ductal dilatation. A small amount of contrast material is seen passing through the ampulla and into the duodenum. There does appear to be a persistent rounded filling defect in the distal common duct. IMPRESSION: 1. Questionable rounded filling defect in the distal common bile duct which may represent a residual stone, sludge, or perhaps ampullary edema. 2. A small amount of contrast material does pass around the filling defect and into the duodenum. Electronically Signed   By: Jacqulynn Cadet M.D.   On: 11/05/2017 15:39  Dg Ercp Biliary & Pancreatic Ducts  Result Date: 11/04/2017 CLINICAL DATA:  77 year old female with common bile duct obstruction EXAM: ERCP TECHNIQUE: Multiple spot images obtained with the fluoroscopic device and submitted for interpretation post-procedure. FLUOROSCOPY TIME:  Fluoroscopy Time:  1 minutes 58 seconds COMPARISON:  MRCP 11/03/2017 FINDINGS: Two intraoperative spot images obtained during  ERCP. The images demonstrate a flexible endoscope in the descending duodenum with deep wire cannulation of the common hepatic duct. The final image demonstrates partial cholangiogram with biliary ductal dilatation. IMPRESSION: ERCP. These images were submitted for radiologic interpretation only. Please see the procedural report for the amount of contrast and the fluoroscopy time utilized. Electronically Signed   By: Jacqulynn Cadet M.D.   On: 11/04/2017 13:20    Anti-infectives: Anti-infectives (From admission, onward)   Start     Dose/Rate Route Frequency Ordered Stop   11/02/17 2300  ceFEPIme (MAXIPIME) 1 g in sodium chloride 0.9 % 100 mL IVPB     1 g 200 mL/hr over 30 Minutes Intravenous Every 12 hours 11/02/17 1531     11/02/17 1130  ceFEPIme (MAXIPIME) 2 g in sodium chloride 0.9 % 100 mL IVPB     2 g 200 mL/hr over 30 Minutes Intravenous  Once 11/02/17 1128 11/02/17 1335       Assessment/Plan HTN Hx of DVT/PE - can resume home PO anticoagulation when tolerating diet COPD  Gallstone pancreatitis - s/p laparoscopic cholecystectomy w/ IOC 11/05/17 Dr. Marlou Starks - POD#1 - repeat LFTs, unable to be assessed from lab draw this AM - restart heparin per pharmacy with no bolus - abdomen soft with good BS, will try suppository - advance to FLD - likely will be able to advance to soft diet in AM and switch back to PO anticoagulation AKI, mild - Cr 1.26, IVF and monitor  FEN: FLD, IVF VTE: SCDs, heparin gtt ID: cefepime 8/31>> Follow up: [facility]  LOS: 4 days    Brigid Re , [facility] 11/06/2017, 9:06 AM Pager: [phone] Consults: [phone] Mon-Fri 7:00 am-4:30 pm Sat-Sun 7:00 am-11:30 am

## 2017-11-06 NOTE — Progress Notes (Signed)
[place] for Heparin  Indication: pulmonary embolus   Allergies  Allergen Reactions  . Penicillins Anaphylaxis    Has patient had a PCN reaction causing immediate rash, facial/tongue/throat swelling, SOB or lightheadedness with hypotension: yes Has patient had a PCN reaction causing severe rash involving mucus membranes or skin necrosis: no Has patient had a PCN reaction that required hospitalization yes Has patient had a PCN reaction occurring within the last 10 years: no If all of the above answers are "NO", then may proceed with Cephalosporin use.   . Sulfa Antibiotics Hives  . Oxycodone Other (See Comments)    hallucanations     Patient Measurements: Height: 5\' 6"  (167.6 cm) Weight: 199 lb 11.8 oz (90.6 kg) IBW/kg (Calculated) : 59.3 Heparin Dosing Weight: 81 kg  Vital Signs: Temp: 97.7 F (36.5 C) (09/04 1500) Temp Source: Oral (09/04 1500) BP: 173/92 (09/04 1500) Pulse Rate: 61 (09/04 1500)  Labs: Recent Labs    11/04/17 0502 11/04/17 2316 11/05/17 0439 11/06/17 0635 11/06/17 1935  HGB 10.7*  --  10.9* 11.5*  --   HCT 32.1*  --  33.0* 35.2*  --   PLT 275  --  269 PLATELET CLUMPS NOTED ON SMEAR, UNABLE TO ESTIMATE  --   APTT 36  --   --   --   --   HEPARINUNFRC <0.10* 0.54 0.68  --  0.31  CREATININE 1.06*  --  1.04* 1.26*  --     Estimated Creatinine Clearance: 41 mL/min (A) (by C-G formula based on SCr of 1.26 mg/dL (H)).  Assessment: 77 year old female with h/o PE, s/p ERCP and cholecystectomy and Xarelto on hold and heparin drip was resumed.  Initial 8 hour heparin level is 0.31 therapeutic on heparin 1000 units/hr. No bleeding reported.    Goal of Therapy:  Heparin level 0.3-0.7 units/ml Monitor platelets by anticoagulation protocol: Yes   Plan:  Continue IV heparin 1000 units/hr Heparin level @ 0500 to confirm remains therapeutic Daily heparin level and cbc while on heparin.  Denise Cline, RPh Clinical  Pharmacist Pager: [phone] Please check AMION for all [place] phone numbers After 10:00 PM, call [place] [phone] 11/06/2017 8:53 PM

## 2017-11-06 NOTE — Progress Notes (Signed)
[facility] PROGRESS NOTE  Denise Cline PZW:[id number] DOB: 1940/03/13 DOA: 11/02/2017  PCP: Josetta Huddle, MD  Brief History/Interval Summary: 77 y.o.femalewith medical history significant ofperipheral vascular disease, thyroid nodules, mild COPD, thoracic aortic aneurysm disease(4.5 cm noted on CT scan in 2016with CT scan in August 2019 showing no change), recent episodes of back pain shoulder pain and abdominal pain who presented to the emergency department complaining of abdominal swelling and nausea and epigastric abdominal pain. Found to have Gallstone pancreatitis and choledocholithiasis with possible Acute Cholecystitis. Patient underwent ERCP  with sphincterotomy.  She underwent cholecystectomy on 9/3.  Reason for Visit: Gallstone pancreatitis  Consultants: Gastroenterology.  General surgery.  Procedures:   ERCP - The cholangiogram was normal. - A biliary sphincterotomy was performed. - The biliary tree was swept and nothing was found. Patient likely had passed CBD stones noted on MRCP, her liver enzymes have normallized.  Laparoscopic cholecystectomy  Antibiotics: Cefepime  Subjective/Interval History: Patient states that she feels better.  Denies any abdominal pain nausea or vomiting.  Has been ambulating.  ROS: Denies any chest pain or shortness of breath  Objective:  Vital Signs  Vitals:   11/05/17 2048 11/06/17 0441 11/06/17 0512 11/06/17 1040  BP: (!) 150/88 (!) 163/88 (!) 161/90 (!) 145/99  Pulse: 66 64 (!) 58 73  Resp: 18 20 18    Temp: 98.1 F (36.7 C) 98 F (36.7 C) 98.6 F (37 C)   TempSrc: Oral Oral Oral   SpO2: 99% 100% 98% 100%  Weight:      Height:        Intake/Output Summary (Last 24 hours) at 11/06/2017 1251 Last data filed at 11/05/2017 1546 Gross per 24 hour  Intake 1000 ml  Output 50 ml  Net 950 ml   Filed Weights   11/02/17 1413 11/04/17 1159  Weight: 90.6 kg 90.6 kg    General appearance: alert, cooperative,  appears stated age and no distress Head: Normocephalic, without obvious abnormality, atraumatic Resp: clear to auscultation bilaterally Cardio: regular rate and rhythm, S1, S2 normal, no murmur, click, rub or gallop GI: Abdomen is soft.  Mildly tender in the right upper quadrant at the incision site. bowel sounds present and normal. Extremities: extremities normal, atraumatic, no cyanosis or edema Neurologic: Alert and oriented x3.  No focal neurological deficits.  Lab Results:  Data Reviewed: I have personally reviewed following labs and imaging studies  CBC: Recent Labs  Lab 11/02/17 0748 11/02/17 0802 11/03/17 0604 11/04/17 0502 11/05/17 0439 11/06/17 0635  WBC 6.3  --  6.0 4.6 4.5 8.5  NEUTROABS  --   --   --   --  3.5 6.5  HGB 12.1 12.2 10.1* 10.7* 10.9* 11.5*  HCT 37.5 36.0 32.1* 32.1* 33.0* 35.2*  MCV 100.3*  --  102.2* 96.1 96.2 98.1  PLT 249  --  257 275 269 PLATELET CLUMPS NOTED ON SMEAR, UNABLE TO ESTIMATE    Basic Metabolic Panel: Recent Labs  Lab 11/02/17 0748 11/02/17 0802 11/03/17 0604 11/04/17 0502 11/05/17 0439 11/06/17 0635 11/06/17 1009  NA 140 140 142 141 139 140  --   K 3.4* 3.5 3.5 3.0* 3.6 4.4  --   CL 103 100 103 102 103 101  --   CO2 27  --  25 30 28 23   --   GLUCOSE 111* 107* 88 96 135* 106*  --   BUN 28* 35* 18 10 12 14   --   CREATININE 1.64* 1.70* 1.14* 1.06* 1.04* 1.26*  --  CALCIUM 8.8*  --  8.5* 8.8* 8.8* 9.8  --   MG  --   --   --  1.2* 1.5* QUANTITY NOT SUFFICIENT, UNABLE TO PERFORM TEST 1.7  PHOS  --   --   --  3.6 2.7 2.5  --     GFR: Estimated Creatinine Clearance: 41 mL/min (A) (by C-G formula based on SCr of 1.26 mg/dL (H)).  Liver Function Tests: Recent Labs  Lab 11/02/17 0748 11/04/17 0502 11/05/17 0439 11/06/17 0635 11/06/17 1009  AST 30 21 30  QUANTITY NOT SUFFICIENT, UNABLE TO PERFORM TEST 127*  ALT 35 27 31 QUANTITY NOT SUFFICIENT, UNABLE TO PERFORM TEST 110*  ALKPHOS 58 53 53 75 78  BILITOT 0.8 0.7 0.7  QUANTITY NOT SUFFICIENT, UNABLE TO PERFORM TEST 0.7  PROT 6.6 6.4* 6.6 6.8 7.5  ALBUMIN 3.2* 2.8* 2.9* 3.2* 3.3*    Recent Labs  Lab 11/02/17 0748 11/03/17 0604 11/04/17 0502  LIPASE 1,701* 105* 47    Thyroid Function Tests: Recent Labs    11/05/17 0439  TSH 0.403     Recent Results (from the past 240 hour(s))  MRSA PCR Screening     Status: None   Collection Time: 11/05/17  8:38 AM  Result Value Ref Range Status   MRSA by PCR NEGATIVE NEGATIVE Final    Comment:        The GeneXpert MRSA Assay (FDA approved for NASAL specimens only), is one component of a comprehensive MRSA colonization surveillance program. It is not intended to diagnose MRSA infection nor to guide or monitor treatment for MRSA infections. Performed at [facility], [place] [address] [place], [place] [address]       Radiology Studies: Dg Cholangiogram Operative  Result Date: 11/05/2017 CLINICAL DATA:  77 year old female status post laparoscopic cholecystectomy with intraoperative cholangiogram EXAM: INTRAOPERATIVE CHOLANGIOGRAM TECHNIQUE: Cholangiographic images from the C-arm fluoroscopic device were submitted for interpretation post-operatively. Please see the procedural report for the amount of contrast and the fluoroscopy time utilized. COMPARISON:  ERCP 11/04/2017; MRCP 11/03/2017 FINDINGS: Cine loop and single intraoperative saved image obtained during intraoperative cholangiogram at the time of laparoscopic cholecystectomy. The images demonstrate cannulation of the cystic duct remanent and opacification of the biliary tree. There is moderate biliary ductal dilatation. A small amount of contrast material is seen passing through the ampulla and into the duodenum. There does appear to be a persistent rounded filling defect in the distal common duct. IMPRESSION: 1. Questionable rounded filling defect in the distal common bile duct which may represent a residual stone, sludge, or perhaps ampullary  edema. 2. A small amount of contrast material does pass around the filling defect and into the duodenum. Electronically Signed   By: Jacqulynn Cadet M.D.   On: 11/05/2017 15:39   Dg Ercp Biliary & Pancreatic Ducts  Result Date: 11/04/2017 CLINICAL DATA:  77 year old female with common bile duct obstruction EXAM: ERCP TECHNIQUE: Multiple spot images obtained with the fluoroscopic device and submitted for interpretation post-procedure. FLUOROSCOPY TIME:  Fluoroscopy Time:  1 minutes 58 seconds COMPARISON:  MRCP 11/03/2017 FINDINGS: Two intraoperative spot images obtained during ERCP. The images demonstrate a flexible endoscope in the descending duodenum with deep wire cannulation of the common hepatic duct. The final image demonstrates partial cholangiogram with biliary ductal dilatation. IMPRESSION: ERCP. These images were submitted for radiologic interpretation only. Please see the procedural report for the amount of contrast and the fluoroscopy time utilized. Electronically Signed   By: Dellis Filbert.D.  On: 11/04/2017 13:20     Medications:  Scheduled: . buPROPion  150 mg Oral Daily  . calcium-vitamin D  1 tablet Oral Q breakfast  . losartan  100 mg Oral Daily  . multivitamin with minerals  1 tablet Oral Daily  . pantoprazole  40 mg Oral Daily  . polyvinyl alcohol  1 drop Both Eyes TID  . rosuvastatin  20 mg Oral Daily   Continuous: . ceFEPime (MAXIPIME) IV 1 g (11/06/17 1107)  . heparin 1,000 Units/hr (11/06/17 1031)   HUT:[id number] **OR** acetaminophen, methocarbamol, morphine injection, morphine injection, ondansetron **OR** ondansetron (ZOFRAN) IV  Assessment/Plan:    Gallstone pancreatitis with possible acute cholecystitis and choledocholithiasis Patient was evaluated by gastroenterology and general surgery.  Patient underwent MRCP which showed multiple small filling defects within the CBD.  Patient subsequently underwent ERCP which did not show any obvious filling  defects and sphincterotomy was performed.  No CBD stone was found.  Patient was started on IV cefepime.  She was thought to require cholecystectomy.  She was seen by cardiology who cleared her for surgery.  Patient underwent cholecystectomy and seems to be doing well postoperatively.  Mild elevation in LFTs noted.  Bilirubin is normal.  Alkaline phosphatase is normal.  Repeat labs tomorrow.  Should be able to discontinue antibiotics soon.  Lipase levels have improved.  Peripheral vascular disease Stable.  Thoracic aortic aneurysm Noted to be unchanged since 2016.  Outpatient monitoring.  Acute kidney injury on chronic kidney disease stage III Renal function stable for the most part.  Creatinine noted to be 1.26 today.  Noted to be on ARB.  Hyperlipidemia Continue statin  Essential hypertension Monitor blood pressures closely.  Thyroid nodules History of surgical removal previously.  TSH normal.  Outpatient follow-up with PCP.  History of COPD Stable  History of DVT and pulmonary embolism Patient is on Xarelto at home.  Has been on IV heparin here.  Could transition to Xarelto tomorrow.  Hypokalemia and hypomagnesemia Repleted.  Sinus bradycardia Improved.  Avoid beta-blockers.  Coronary artery disease Stable.  Normocytic anemia Stable  DVT Prophylaxis: On IV heparin    Code Status: Full code Family Communication: Discussed with the patient. Disposition Plan: She feels like she may need rehab.  PT and OT consulted.     LOS: 4 days   [place] [facility] Pager [phone] 11/06/2017, 12:51 PM  If 7PM-7AM, please contact night-coverage at [email or web address], password [facility]

## 2017-11-06 NOTE — Progress Notes (Signed)
[place] for Heparin  Indication: pulmonary embolus   Allergies  Allergen Reactions  . Penicillins Anaphylaxis    Has patient had a PCN reaction causing immediate rash, facial/tongue/throat swelling, SOB or lightheadedness with hypotension: yes Has patient had a PCN reaction causing severe rash involving mucus membranes or skin necrosis: no Has patient had a PCN reaction that required hospitalization yes Has patient had a PCN reaction occurring within the last 10 years: no If all of the above answers are "NO", then may proceed with Cephalosporin use.   . Sulfa Antibiotics Hives  . Oxycodone Other (See Comments)    hallucanations     Patient Measurements: Height: 5\' 6"  (167.6 cm) Weight: 199 lb 11.8 oz (90.6 kg) IBW/kg (Calculated) : 59.3 Heparin Dosing Weight: 81 kg  Vital Signs: Temp: 98.6 F (37 C) (09/04 0512) Temp Source: Oral (09/04 0512) BP: 161/90 (09/04 0512) Pulse Rate: 58 (09/04 0512)  Labs: Recent Labs    11/04/17 0502 11/04/17 2316 11/05/17 0439  HGB 10.7*  --  10.9*  HCT 32.1*  --  33.0*  PLT 275  --  269  APTT 36  --   --   HEPARINUNFRC <0.10* 0.54 0.68  CREATININE 1.06*  --  1.04*    Estimated Creatinine Clearance: 49.7 mL/min (A) (by C-G formula based on SCr of 1.04 mg/dL (H)).  Assessment: 77 year old female with h/o PE, s/p ERCP and cholecystectomy and Xarelto on hold, to resume heparin   Goal of Therapy:  Heparin level 0.3-0.7 units/ml Monitor platelets by anticoagulation protocol: Yes   Plan:  Restart heparin 1000 units/hr Check heparin level in 8 hours.   Phillis Knack, PharmD, BCPS

## 2017-11-06 NOTE — Evaluation (Signed)
Physical Therapy Evaluation Patient Details Name: Denise Cline MRN: [id number] DOB: Nov 14, 1940 Today's Date: 11/06/2017   History of Present Illness  77yo female presenting with abdominal pain and swelling, nausea, epigastric pain. Diagnosed with choledocholithiasis with obstruction and pancreatitis. Received ECRP on 9/2 and laparoscopic cholecystectomy on 9/3. PMH anxiety, COPD, HTN   Clinical Impression   Patient received in bed, very pleasant and willing to participate in PT session today. She is able to complete functional bed mobility, transfers, and gait approximately 580f with no assistive device and general S, no significant gait or balance deficits noted today. Able to toilet with S and assist for IV line management. She was left in bed with all needs met, visitor present. At this point she does not appear to be in need of skilled PT services during her hospital stay or after discharge. PT signing off, thank you for the referral.     Follow Up Recommendations No PT follow up    Equipment Recommendations  None recommended by PT    Recommendations for Other Services       Precautions / Restrictions Precautions Precautions: Other (comment) Precaution Comments: abdominal incision  Restrictions Weight Bearing Restrictions: No      Mobility  Bed Mobility Overal bed mobility: Needs Assistance Bed Mobility: Rolling;Sidelying to Sit;Sit to Sidelying Rolling: Supervision Sidelying to sit: Supervision     Sit to sidelying: Supervision General bed mobility comments: VC for rolling/sidelying to and from sitting to protect site of abdominal incisions, no physical assist given   Transfers Overall transfer level: Needs assistance Equipment used: None Transfers: Sit to/from Stand Sit to Stand: Supervision         General transfer comment: S for safety, no physical assist given   Ambulation/Gait Ambulation/Gait assistance: Supervision Gait Distance (Feet): 500  Feet Assistive device: None Gait Pattern/deviations: WFL(Within Functional Limits)     General Gait Details: mild SOB noted, no significant gait or balance deficit, able to maintain conversation consistently during gait   Stairs            Wheelchair Mobility    Modified Rankin (Stroke Patients Only)       Balance Overall balance assessment: No apparent balance deficits (not formally assessed)                                           Pertinent Vitals/Pain Pain Assessment: No/denies pain    Home Living Family/patient expects to be discharged to:: Private residence Living Arrangements: Alone Available Help at Discharge: Family;Available PRN/intermittently Type of Home: House Home Access: Stairs to enter Entrance Stairs-Rails: Right Entrance Stairs-Number of Steps: 3 Home Layout: One level Home Equipment: None Additional Comments: working on getting rollator     Prior Function Level of Independence: Independent               Hand Dominance        Extremity/Trunk Assessment   Upper Extremity Assessment Upper Extremity Assessment: Overall WFL for tasks assessed    Lower Extremity Assessment Lower Extremity Assessment: Overall WFL for tasks assessed    Cervical / Trunk Assessment Cervical / Trunk Assessment: Normal  Communication   Communication: No difficulties  Cognition Arousal/Alertness: Awake/alert Behavior During Therapy: WFL for tasks assessed/performed Overall Cognitive Status: Within Functional Limits for tasks assessed  General Comments      Exercises     Assessment/Plan    PT Assessment Patent does not need any further PT services  PT Problem List Decreased activity tolerance       PT Treatment Interventions Patient/family education    PT Goals (Current goals can be found in the Care Plan section)  Acute Rehab PT Goals Patient Stated Goal: to go  home, be able to get up in truck  PT Goal Formulation: With patient Time For Goal Achievement: 11/20/17 Potential to Achieve Goals: Good    Frequency Other (Comment)(eval only )   Barriers to discharge        Co-evaluation               AM-PAC PT "6 Clicks" Daily Activity  Outcome Measure Difficulty turning over in bed (including adjusting bedclothes, sheets and blankets)?: None Difficulty moving from lying on back to sitting on the side of the bed? : None Difficulty sitting down on and standing up from a chair with arms (e.g., wheelchair, bedside commode, etc,.)?: None Help needed moving to and from a bed to chair (including a wheelchair)?: None Help needed walking in hospital room?: None Help needed climbing 3-5 steps with a railing? : A Little 6 Click Score: 23    End of Session   Activity Tolerance: Patient tolerated treatment well Patient left: in bed;with call bell/phone within reach;with family/visitor present   PT Visit Diagnosis: Pain Pain - Right/Left: (abdominal pain ) Pain - part of body: (abdominal pain )    Time: 7519-8242 PT Time Calculation (min) (ACUTE ONLY): 34 min   Charges:   PT Evaluation $PT Eval Low Complexity: 1 Low PT Treatments $Gait Training: 8-22 mins        Deniece Ree PT, DPT, CBIS  Supplemental Physical Therapist [place]    Pager [phone] Acute Rehab Office [phone]

## 2017-11-06 NOTE — Progress Notes (Signed)
OT Discharge Note  Patient Details Name: JUSTISE EHMANN MRN: [id number] DOB: [birth year]   Cancelled Treatment:    Reason Eval/Treat Not Completed: OT screened, no needs identified, will sign off Spoke to [employer] and no acute needs     Jeri Modena, OTR/L  Acute Rehabilitation Services Pager: [phone] Office: [phone] .  Parke Poisson B 11/06/2017, 5:11 PM

## 2017-11-07 LAB — COMPREHENSIVE METABOLIC PANEL
ALT: 81 U/L — ABNORMAL HIGH (ref 0–44)
ANION GAP: 9 (ref 5–15)
AST: 60 U/L — AB (ref 15–41)
Albumin: 2.9 g/dL — ABNORMAL LOW (ref 3.5–5.0)
Alkaline Phosphatase: 64 U/L (ref 38–126)
BUN: 12 mg/dL (ref 8–23)
CHLORIDE: 102 mmol/L (ref 98–111)
CO2: 29 mmol/L (ref 22–32)
Calcium: 8.8 mg/dL — ABNORMAL LOW (ref 8.9–10.3)
Creatinine, Ser: 1.07 mg/dL — ABNORMAL HIGH (ref 0.44–1.00)
GFR calc Af Amer: 56 mL/min — ABNORMAL LOW (ref >60)
GFR, EST NON AFRICAN AMERICAN: 48 mL/min — AB (ref >60)
Glucose, Bld: 92 mg/dL (ref 70–99)
POTASSIUM: 3.5 mmol/L (ref 3.5–5.1)
Sodium: 140 mmol/L (ref 135–145)
Total Bilirubin: 0.8 mg/dL (ref 0.3–1.2)
Total Protein: 6.3 g/dL — ABNORMAL LOW (ref 6.5–8.1)

## 2017-11-07 LAB — CBC
HCT: 32.7 % — ABNORMAL LOW (ref 36.0–46.0)
Hemoglobin: 10.5 g/dL — ABNORMAL LOW (ref 12.0–15.0)
MCH: 31.6 pg (ref 26.0–34.0)
MCHC: 32.1 g/dL (ref 30.0–36.0)
MCV: 98.5 fL (ref 78.0–100.0)
PLATELETS: 299 10*3/uL (ref 150–400)
RBC: 3.32 MIL/uL — ABNORMAL LOW (ref 3.87–5.11)
RDW: 13.6 % (ref 11.5–15.5)
WBC: 9.2 10*3/uL (ref 4.0–10.5)

## 2017-11-07 LAB — HEPARIN LEVEL (UNFRACTIONATED): Heparin Unfractionated: 0.47 IU/mL (ref 0.30–0.70)

## 2017-11-07 MED ORDER — POLYETHYLENE GLYCOL 3350 17 G PO PACK: 17.0000 g | PACK | Freq: Every day | ORAL | 0 refills | Status: AC

## 2017-11-07 MED ORDER — RIVAROXABAN 20 MG PO TABS
20.0000 mg | ORAL_TABLET | Freq: Every day | ORAL | Status: DC
  Administered 2017-11-07: 20 mg via ORAL
  Filled 2017-11-07: qty 1

## 2017-11-07 MED ORDER — METHOCARBAMOL 500 MG PO TABS
500.0000 mg | ORAL_TABLET | Freq: Once | ORAL | Status: AC
  Administered 2017-11-08: 500 mg via ORAL

## 2017-11-07 MED ORDER — POTASSIUM CHLORIDE CRYS ER 20 MEQ PO TBCR
40.0000 meq | EXTENDED_RELEASE_TABLET | Freq: Once | ORAL | Status: AC
  Administered 2017-11-07: 40 meq via ORAL
  Filled 2017-11-07: qty 2

## 2017-11-07 MED ORDER — POLYETHYLENE GLYCOL 3350 17 G PO PACK
17.0000 g | PACK | Freq: Every day | ORAL | Status: DC
  Administered 2017-11-07 – 2017-11-08 (×2): 17 g via ORAL
  Filled 2017-11-07 (×2): qty 1

## 2017-11-07 NOTE — Progress Notes (Signed)
[facility] PROGRESS NOTE  Denise Cline EYC:[id number] DOB: 1940-12-15 DOA: 11/02/2017  PCP: Josetta Huddle, MD  Brief History/Interval Summary: 77 y.o.femalewith medical history significant ofperipheral vascular disease, thyroid nodules, mild COPD, thoracic aortic aneurysm disease(4.5 cm noted on CT scan in 2016with CT scan in August 2019 showing no change), recent episodes of back pain shoulder pain and abdominal pain who presented to the emergency department complaining of abdominal swelling and nausea and epigastric abdominal pain. Found to have Gallstone pancreatitis and choledocholithiasis with possible Acute Cholecystitis. Patient underwent ERCP with sphincterotomy.  She underwent cholecystectomy on 9/3.  Reason for Visit: Gallstone pancreatitis  Consultants: Gastroenterology.  General surgery.  Procedures:   ERCP - The cholangiogram was normal. - A biliary sphincterotomy was performed. - The biliary tree was swept and nothing was found. Patient likely had passed CBD stones noted on MRCP, her liver enzymes have normallized.  Laparoscopic cholecystectomy  Antibiotics: Cefepime till 9/5  Subjective/Interval History: Patient continues to feel well however has not had any bowel movement yet.  Passing some gas from below.  Denies any nausea vomiting.  Reports poor appetite.    ROS: Denies any chest pain or shortness of breath  Objective:  Vital Signs  Vitals:   11/06/17 0512 11/06/17 1040 11/06/17 1500 11/07/17 0600  BP: (!) 161/90 (!) 145/99 (!) 173/92 (!) 162/92  Pulse: (!) 58 73 61 72  Resp: 18     Temp: 98.6 F (37 C)  97.7 F (36.5 C) 98.8 F (37.1 C)  TempSrc: Oral  Oral Oral  SpO2: 98% 100% 99% 100%  Weight:      Height:        Intake/Output Summary (Last 24 hours) at 11/07/2017 1219 Last data filed at 11/07/2017 0300 Gross per 24 hour  Intake 3047.11 ml  Output -  Net 3047.11 ml   Filed Weights   11/02/17 1413 11/04/17 1159  Weight:  90.6 kg 90.6 kg    General appearance: Awake alert.  In no distress Resp: Clear to auscultation bilaterally.  No wheezing rales or rhonchi. Cardio: S1-S2 is normal regular.  No S3-S4.  No rubs murmurs or bruit GI: Abdomen remains soft.  Mildly tender at the incision sites.  No rebound rigidity or guarding.  Bowel sounds present.  No obvious masses organomegaly. Extremities: No edema Neurologic: No focal neurological deficits  Lab Results:  Data Reviewed: I have personally reviewed following labs and imaging studies  CBC: Recent Labs  Lab 11/03/17 0604 11/04/17 0502 11/05/17 0439 11/06/17 0635 11/07/17 0553  WBC 6.0 4.6 4.5 8.5 9.2  NEUTROABS  --   --  3.5 6.5  --   HGB 10.1* 10.7* 10.9* 11.5* 10.5*  HCT 32.1* 32.1* 33.0* 35.2* 32.7*  MCV 102.2* 96.1 96.2 98.1 98.5  PLT 257 275 269 PLATELET CLUMPS NOTED ON SMEAR, UNABLE TO ESTIMATE 149    Basic Metabolic Panel: Recent Labs  Lab 11/03/17 0604 11/04/17 0502 11/05/17 0439 11/06/17 0635 11/06/17 1009 11/07/17 0553  NA 142 141 139 140  --  140  K 3.5 3.0* 3.6 4.4  --  3.5  CL 103 102 103 101  --  102  CO2 25 30 28 23   --  29  GLUCOSE 88 96 135* 106*  --  92  BUN 18 10 12 14   --  12  CREATININE 1.14* 1.06* 1.04* 1.26*  --  1.07*  CALCIUM 8.5* 8.8* 8.8* 9.8  --  8.8*  MG  --  1.2* 1.5* QUANTITY NOT  SUFFICIENT, UNABLE TO PERFORM TEST 1.7  --   PHOS  --  3.6 2.7 2.5  --   --     GFR: Estimated Creatinine Clearance: 48.3 mL/min (A) (by C-G formula based on SCr of 1.07 mg/dL (H)).  Liver Function Tests: Recent Labs  Lab 11/04/17 0502 11/05/17 0439 11/06/17 0635 11/06/17 1009 11/07/17 0553  AST 21 30 QUANTITY NOT SUFFICIENT, UNABLE TO PERFORM TEST 127* 60*  ALT 27 31 QUANTITY NOT SUFFICIENT, UNABLE TO PERFORM TEST 110* 81*  ALKPHOS 53 53 75 78 64  BILITOT 0.7 0.7 QUANTITY NOT SUFFICIENT, UNABLE TO PERFORM TEST 0.7 0.8  PROT 6.4* 6.6 6.8 7.5 6.3*  ALBUMIN 2.8* 2.9* 3.2* 3.3* 2.9*    Recent Labs  Lab  11/02/17 0748 11/03/17 0604 11/04/17 0502  LIPASE 1,701* 105* 47    Thyroid Function Tests: Recent Labs    11/05/17 0439  TSH 0.403     Recent Results (from the past 240 hour(s))  MRSA PCR Screening     Status: None   Collection Time: 11/05/17  8:38 AM  Result Value Ref Range Status   MRSA by PCR NEGATIVE NEGATIVE Final    Comment:        The GeneXpert MRSA Assay (FDA approved for NASAL specimens only), is one component of a comprehensive MRSA colonization surveillance program. It is not intended to diagnose MRSA infection nor to guide or monitor treatment for MRSA infections. Performed at [facility], [place] [address] [place], [place] [address]       Radiology Studies: Dg Cholangiogram Operative  Result Date: 11/05/2017 CLINICAL DATA:  77 year old female status post laparoscopic cholecystectomy with intraoperative cholangiogram EXAM: INTRAOPERATIVE CHOLANGIOGRAM TECHNIQUE: Cholangiographic images from the C-arm fluoroscopic device were submitted for interpretation post-operatively. Please see the procedural report for the amount of contrast and the fluoroscopy time utilized. COMPARISON:  ERCP 11/04/2017; MRCP 11/03/2017 FINDINGS: Cine loop and single intraoperative saved image obtained during intraoperative cholangiogram at the time of laparoscopic cholecystectomy. The images demonstrate cannulation of the cystic duct remanent and opacification of the biliary tree. There is moderate biliary ductal dilatation. A small amount of contrast material is seen passing through the ampulla and into the duodenum. There does appear to be a persistent rounded filling defect in the distal common duct. IMPRESSION: 1. Questionable rounded filling defect in the distal common bile duct which may represent a residual stone, sludge, or perhaps ampullary edema. 2. A small amount of contrast material does pass around the filling defect and into the duodenum. Electronically Signed   By: Jacqulynn Cadet M.D.   On: 11/05/2017 15:39     Medications:  Scheduled: . buPROPion  150 mg Oral Daily  . calcium-vitamin D  1 tablet Oral Q breakfast  . losartan  100 mg Oral Daily  . multivitamin with minerals  1 tablet Oral Daily  . pantoprazole  40 mg Oral Daily  . polyethylene glycol  17 g Oral Daily  . polyvinyl alcohol  1 drop Both Eyes TID  . rosuvastatin  20 mg Oral Daily   Continuous: . ceFEPime (MAXIPIME) IV 1 g (11/07/17 1005)  . heparin 1,000 Units/hr (11/07/17 1010)   YQI:[id number] **OR** acetaminophen, methocarbamol, morphine injection, morphine injection, ondansetron **OR** ondansetron (ZOFRAN) IV  Assessment/Plan:    Gallstone pancreatitis with possible acute cholecystitis and choledocholithiasis Patient was evaluated by gastroenterology and general surgery.  Patient underwent MRCP which showed multiple small filling defects within the CBD.  Patient subsequently underwent ERCP which did  not show any obvious filling defects and sphincterotomy was performed.  No CBD stone was found.  Patient was started on IV cefepime.  She was thought to require cholecystectomy.  She was seen by cardiology who cleared her for surgery.  Patient underwent cholecystectomy and seems to be doing well postoperatively.  She did have mild elevation in her AST and ALT which is improving.  Bilirubin and alkaline phosphatase levels have been normal.  Discontinue antibiotics.  Lipase levels have improved.   Peripheral vascular disease Stable.  Thoracic aortic aneurysm Noted to be unchanged since 2016.  Outpatient monitoring.  Acute kidney injury on chronic kidney disease stage III Renal function has improved and back to baseline.  Hyperlipidemia Continue statin  Essential hypertension Monitor blood pressures closely.  Thyroid nodules History of surgical removal previously.  TSH normal.  Outpatient follow-up with PCP.  History of COPD Stable  History of DVT and pulmonary  embolism Patient is on Xarelto at home.  Has been on IV heparin here.  Transition back to Xarelto today.  Hypokalemia and hypomagnesemia Repleted.  Sinus bradycardia Improved.  Avoid beta-blockers.  Coronary artery disease Stable.  Normocytic anemia Stable  DVT Prophylaxis: Transition back to Xarelto Code Status: Full code Family Communication: Discussed with the patient. Disposition Plan: Patient seen by PT.  Does not have any needs from a physical therapy standpoint.  Will not qualify for rehab.  Laxatives initiated by general surgery.  Stop antibiotics today.  Change to oral Xarelto.  Advance diet.  Hopefully discharge in 24 to 48 hours.   LOS: 5 days   [facility] Pager [phone] 11/07/2017, 12:19 PM  If 7PM-7AM, please contact night-coverage at [email or web address], password [facility]

## 2017-11-07 NOTE — Discharge Instructions (Signed)
Please arrive at least 30 min before your appointment to complete your check in paperwork.  If you are unable to arrive 30 min prior to your appointment time we may have to cancel or reschedule you. ° °LAPAROSCOPIC SURGERY: POST OP INSTRUCTIONS  °1. DIET: Follow a light bland diet the first 24 hours after arrival home, such as soup, liquids, crackers, etc. Be sure to include lots of fluids daily. Avoid fast food or heavy meals as your are more likely to get nauseated. Eat a low fat the next few days after surgery.  °2. Take your usually prescribed home medications unless otherwise directed. °3. PAIN CONTROL:  °1. Pain is best controlled by a usual combination of three different methods TOGETHER:  °1. Ice/Heat °2. Over the counter pain medication °3. Prescription pain medication °2. Most patients will experience some swelling and bruising around the incisions. Ice packs or heating pads (30-60 minutes up to 6 times a day) will help. Use ice for the first few days to help decrease swelling and bruising, then switch to heat to help relax tight/sore spots and speed recovery. Some people prefer to use ice alone, heat alone, alternating between ice & heat. Experiment to what works for you. Swelling and bruising can take several weeks to resolve.  °3. It is helpful to take an over-the-counter pain medication regularly for the first few weeks. Choose one of the following that works best for you:  °1. Naproxen (Aleve, etc) Two 220mg tabs twice a day °2. Ibuprofen (Advil, etc) Three 200mg tabs four times a day (every meal & bedtime) °3. Acetaminophen (Tylenol, etc) 500-650mg four times a day (every meal & bedtime) °4. A prescription for pain medication (such as oxycodone, hydrocodone, etc) should be given to you upon discharge. Take your pain medication as prescribed.  °1. If you are having problems/concerns with the prescription medicine (does not control pain, nausea, vomiting, rash, itching, etc), please call us [phone] to see if we need to switch you to a different pain medicine that will work better for you and/or control your side effect better. °2. If you need a refill on your pain medication, please contact your pharmacy. They will contact our office to request authorization. Prescriptions will not be filled after 5 pm or on week-ends. °4. Avoid getting constipated. Between the surgery and the pain medications, it is common to experience some constipation. Increasing fluid intake and taking a fiber supplement (such as Metamucil, Citrucel, FiberCon, MiraLax, etc) 1-2 times a day regularly will usually help prevent this problem from occurring. A mild laxative (prune juice, Milk of Magnesia, MiraLax, etc) should be taken according to package directions if there are no bowel movements after 48 hours.  °5. Watch out for diarrhea. If you have many loose bowel movements, simplify your diet to bland foods & liquids for a few days. Stop any stool softeners and decrease your fiber supplement. Switching to mild anti-diarrheal medications (Kayopectate, Pepto Bismol) can help. If this worsens or does not improve, please call us. °6. Wash / shower every day. You may shower over the dressings as they are waterproof. Continue to shower over incision(s) after the dressing is off. °7. Remove your waterproof bandages 5 days after surgery. You may leave the incision open to air. You may replace a dressing/Band-Aid to cover the incision for comfort if you wish.  °8. ACTIVITIES as tolerated:  °1. You may resume regular (light) daily activities beginning the next day--such as daily self-care, walking, climbing stairs--gradually   increasing activities as tolerated. If you can walk 30 minutes without difficulty, it is safe to try more intense activity such as jogging, treadmill, bicycling, low-impact aerobics, swimming, etc. 2. Save the most intensive and strenuous activity for last such as sit-ups, heavy lifting, contact sports, etc Refrain  from any heavy lifting or straining until you are off narcotics for pain control.  3. DO NOT PUSH THROUGH PAIN. Let pain be your guide: If it hurts to do something, don't do it. Pain is your body warning you to avoid that activity for another week until the pain goes down. 4. You may drive when you are no longer taking prescription pain medication, you can comfortably wear a seatbelt, and you can safely maneuver your car and apply brakes. 5. You may have sexual intercourse when it is comfortable.  9. FOLLOW UP in our office  1. Please call [facility] at [phone] to set up an appointment to see your surgeon in the office for a follow-up appointment approximately 2-3 weeks after your surgery. 2. Make sure that you call for this appointment the day you arrive home to insure a convenient appointment time.      10. IF YOU HAVE DISABILITY OR FAMILY LEAVE FORMS, BRING THEM TO THE               OFFICE FOR PROCESSING.   WHEN TO CALL us [phone]:  1. Poor pain control 2. Reactions / problems with new medications (rash/itching, nausea, etc)  3. Fever over 101.5 F (38.5 C) 4. Inability to urinate 5. Nausea and/or vomiting 6. Worsening swelling or bruising 7. Continued bleeding from incision. 8. Increased pain, redness, or drainage from the incision  The clinic staff is available to answer your questions during regular business hours (8:30am-5pm). Please dont hesitate to call and ask to speak to one of our nurses for clinical concerns.  If you have a medical emergency, go to the nearest emergency room or call 911.  A surgeon from [facility] is always on call at the [facility], [address], [place], [place], [place] [address] ?  MAIN: [phone] ? TOLL FREE: [phone] ?  FAX [phone]  [email or web address]  Information on my medicine - XARELTO (rivaroxaban)  This medication education was reviewed with me or my  healthcare representative as part of my discharge preparation.  The pharmacist that spoke with me during my hospital stay was:  Saundra Shelling, [place]? Xarelto was prescribed to treat blood clots that may have been found in the veins of your legs (deep vein thrombosis) or in your lungs (pulmonary embolism) and to reduce the risk of them occurring again.  What do you need to know about Xarelto? The dose is one 20 mg tablet taken ONCE A DAY with your evening meal.  DO NOT stop taking Xarelto without talking to the health care provider who prescribed the medication.  Refill your prescription for 20 mg tablets before you run out.  After discharge, you should have regular check-up appointments with your healthcare provider that is prescribing your Xarelto.  In the future your dose may need to be changed if your kidney function changes by a significant amount.  What do you do if you miss a dose? If you are taking Xarelto TWICE DAILY and you miss a dose, take it as soon as you remember. You may take two 15 mg tablets (total  30 mg) at the same time then resume your regularly scheduled 15 mg twice daily the next day.  If you are taking Xarelto ONCE DAILY and you miss a dose, take it as soon as you remember on the same day then continue your regularly scheduled once daily regimen the next day. Do not take two doses of Xarelto at the same time.   Important Safety Information Xarelto is a blood thinner medicine that can cause bleeding. You should call your healthcare provider right away if you experience any of the following: ? Bleeding from an injury or your nose that does not stop. ? Unusual colored urine (red or dark brown) or unusual colored stools (red or black). ? Unusual bruising for unknown reasons. ? A serious fall or if you hit your head (even if there is no bleeding).  Some medicines may interact with Xarelto and might increase your risk of bleeding  while on Xarelto. To help avoid this, consult your healthcare provider or pharmacist prior to using any new prescription or non-prescription medications, including herbals, vitamins, non-steroidal anti-inflammatory drugs (NSAIDs) and supplements.  This website has more information on Xarelto: [email or web address]

## 2017-11-07 NOTE — Progress Notes (Signed)
2 Days Post-Op   Subjective/Chief Complaint: Complains of constipation   Objective: Vital signs in last 24 hours: Temp:  [97.7 F (36.5 C)-98.8 F (37.1 C)] 98.8 F (37.1 C) (09/05 0600) Pulse Rate:  [61-73] 72 (09/05 0600) BP: (145-173)/(92-99) 162/92 (09/05 0600) SpO2:  [99 %-100 %] 100 % (09/05 0600) Last BM Date: 11/05/17  Intake/Output from previous day: 09/04 0701 - 09/05 0700 In: 3047.1 [I.V.:164.7; IV Piggyback:2882.4] Out: -  Intake/Output this shift: No intake/output data recorded.  General appearance: alert and cooperative Resp: clear to auscultation bilaterally Cardio: regular rate and rhythm GI: soft, minimal tenderness.  Lab Results:  Recent Labs    11/06/17 0635 11/07/17 0553  WBC 8.5 9.2  HGB 11.5* 10.5*  HCT 35.2* 32.7*  PLT PLATELET CLUMPS NOTED ON SMEAR, UNABLE TO ESTIMATE 299   BMET Recent Labs    11/06/17 0635 11/07/17 0553  NA 140 140  K 4.4 3.5  CL 101 102  CO2 23 29  GLUCOSE 106* 92  BUN 14 12  CREATININE 1.26* 1.07*  CALCIUM 9.8 8.8*   PT/INR No results for input(s): LABPROT, INR in the last 72 hours. ABG No results for input(s): PHART, HCO3 in the last 72 hours.  Invalid input(s): PCO2, PO2  Studies/Results: Dg Cholangiogram Operative  Result Date: 11/05/2017 CLINICAL DATA:  77 year old female status post laparoscopic cholecystectomy with intraoperative cholangiogram EXAM: INTRAOPERATIVE CHOLANGIOGRAM TECHNIQUE: Cholangiographic images from the C-arm fluoroscopic device were submitted for interpretation post-operatively. Please see the procedural report for the amount of contrast and the fluoroscopy time utilized. COMPARISON:  ERCP 11/04/2017; MRCP 11/03/2017 FINDINGS: Cine loop and single intraoperative saved image obtained during intraoperative cholangiogram at the time of laparoscopic cholecystectomy. The images demonstrate cannulation of the cystic duct remanent and opacification of the biliary tree. There is moderate biliary  ductal dilatation. A small amount of contrast material is seen passing through the ampulla and into the duodenum. There does appear to be a persistent rounded filling defect in the distal common duct. IMPRESSION: 1. Questionable rounded filling defect in the distal common bile duct which may represent a residual stone, sludge, or perhaps ampullary edema. 2. A small amount of contrast material does pass around the filling defect and into the duodenum. Electronically Signed   By: Jacqulynn Cadet M.D.   On: 11/05/2017 15:39    Anti-infectives: Anti-infectives (From admission, onward)   Start     Dose/Rate Route Frequency Ordered Stop   11/02/17 2300  ceFEPIme (MAXIPIME) 1 g in sodium chloride 0.9 % 100 mL IVPB     1 g 200 mL/hr over 30 Minutes Intravenous Every 12 hours 11/02/17 1531     11/02/17 1130  ceFEPIme (MAXIPIME) 2 g in sodium chloride 0.9 % 100 mL IVPB     2 g 200 mL/hr over 30 Minutes Intravenous  Once 11/02/17 1128 11/02/17 1335      Assessment/Plan: s/p Procedure(s): LAPAROSCOPIC CHOLECYSTECTOMY WITH INTRAOPERATIVE CHOLANGIOGRAM (N/A) Advance diet  Will start miralax for constipation Placement per Medicine lft's improving Ok for d/c when medically stable  LOS: 5 days    TOTH III,PAUL S 11/07/2017

## 2017-11-07 NOTE — Anesthesia Postprocedure Evaluation (Signed)
Anesthesia Post Note  Patient: Denise Cline  Procedure(s) Performed: LAPAROSCOPIC CHOLECYSTECTOMY WITH INTRAOPERATIVE CHOLANGIOGRAM (N/A Abdomen)     Patient location during evaluation: PACU Anesthesia Type: General Level of consciousness: awake and alert Pain management: pain level controlled Vital Signs Assessment: post-procedure vital signs reviewed and stable Respiratory status: spontaneous breathing, nonlabored ventilation, respiratory function stable and patient connected to nasal cannula oxygen Cardiovascular status: blood pressure returned to baseline and stable Postop Assessment: no apparent nausea or vomiting Anesthetic complications: no    Last Vitals:  Vitals:   11/06/17 1500 11/07/17 0600  BP: (!) 173/92 (!) 162/92  Pulse: 61 72  Resp:    Temp: 36.5 C 37.1 C  SpO2: 99% 100%    Last Pain:  Vitals:   11/07/17 1012  TempSrc:   PainSc: [place]

## 2017-11-07 NOTE — Progress Notes (Signed)
ANTICOAGULATION CONSULT NOTE  Pharmacy Consult:  Heparin  Indication: pulmonary embolus   Allergies  Allergen Reactions  . Penicillins Anaphylaxis    Has patient had a PCN reaction causing immediate rash, facial/tongue/throat swelling, SOB or lightheadedness with hypotension: yes Has patient had a PCN reaction causing severe rash involving mucus membranes or skin necrosis: no Has patient had a PCN reaction that required hospitalization yes Has patient had a PCN reaction occurring within the last 10 years: no If all of the above answers are "NO", then may proceed with Cephalosporin use.   . Sulfa Antibiotics Hives  . Oxycodone Other (See Comments)    hallucanations     Patient Measurements: Height: 5\' 6"  (167.6 cm) Weight: 199 lb 11.8 oz (90.6 kg) IBW/kg (Calculated) : 59.3 Heparin Dosing Weight: 81 kg  Vital Signs: Temp: 98.8 F (37.1 C) (09/05 0600) Temp Source: Oral (09/05 0600) BP: 162/92 (09/05 0600) Pulse Rate: 72 (09/05 0600)  Labs: Recent Labs    11/05/17 0439 11/06/17 0635 11/06/17 1935 11/07/17 0553  HGB 10.9* 11.5*  --  10.5*  HCT 33.0* 35.2*  --  32.7*  PLT 269 PLATELET CLUMPS NOTED ON SMEAR, UNABLE TO ESTIMATE  --  299  HEPARINUNFRC 0.68  --  0.31 0.47  CREATININE 1.04* 1.26*  --  1.07*    Estimated Creatinine Clearance: 48.3 mL/min (A) (by C-G formula based on SCr of 1.07 mg/dL (H)).  Assessment: 51 YOF with history of PE on Xarelto PTA.  Patient was transitioned to IV heparin for surgery, now to resume Xarelto.  SCr 1.07, CrCL 60 ml/min, no bleeding.     Goal of Therapy:  Heparin level 0.3-0.7 units/ml Monitor platelets by anticoagulation protocol: Yes    Plan:  Stop heparin when Xarelto is administered Xarelto 20mg  PO daily with supper, start at lunch today Pharmacy will sign off and follow peripherally.  Thank you for the consult!   Shavon Ashmore D. Mina Marble, PharmD, BCPS, [place] 11/07/2017, 12:34 PM

## 2017-11-08 NOTE — Progress Notes (Signed)
[facility] Progress Note  3 Days Post-Op  Subjective: CC: emotional stress Patient feeling emotional about the loss of her oldest son today. She is tolerating a diet and having bowel function. Pain is more soreness and isn't too bad. Explained follow up.   Objective: Vital signs in last 24 hours: Temp:  [98 F (36.7 C)-98.4 F (36.9 C)] 98.2 F (36.8 C) (09/06 0500) Pulse Rate:  [55-67] 62 (09/06 0500) Resp:  [18-19] 19 (09/06 0500) BP: (151-178)/(88-98) 154/94 (09/06 0500) SpO2:  [96 %-100 %] 96 % (09/06 0500) Last BM Date: 11/07/17  Intake/Output from previous day: 09/05 0701 - 09/06 0700 In: 720 [P.O.:720] Out: -  Intake/Output this shift: No intake/output data recorded.  PE: Gen:  Alert, NAD, pleasant Card:  Regular rate and rhythm, pedal pulses 2+ BL Pulm:  Normal effort, clear to auscultation bilaterally Abd: Soft, non-tender, non-distended, bowel sounds present in all 4 quadrants, no HSM, incisions C/D/I Skin: warm and dry, no rashes  Psych: A&Ox3   Lab Results:  Recent Labs    11/06/17 0635 11/07/17 0553  WBC 8.5 9.2  HGB 11.5* 10.5*  HCT 35.2* 32.7*  PLT PLATELET CLUMPS NOTED ON SMEAR, UNABLE TO ESTIMATE 299   BMET Recent Labs    11/06/17 0635 11/07/17 0553  NA 140 140  K 4.4 3.5  CL 101 102  CO2 23 29  GLUCOSE 106* 92  BUN 14 12  CREATININE 1.26* 1.07*  CALCIUM 9.8 8.8*   PT/INR No results for input(s): LABPROT, INR in the last 72 hours. CMP     Component Value Date/Time   NA 140 11/07/2017 0553   K 3.5 11/07/2017 0553   CL 102 11/07/2017 0553   CO2 29 11/07/2017 0553   GLUCOSE 92 11/07/2017 0553   BUN 12 11/07/2017 0553   CREATININE 1.07 (H) 11/07/2017 0553   CALCIUM 8.8 (L) 11/07/2017 0553   PROT 6.3 (L) 11/07/2017 0553   ALBUMIN 2.9 (L) 11/07/2017 0553   AST 60 (H) 11/07/2017 0553   ALT 81 (H) 11/07/2017 0553   ALKPHOS 64 11/07/2017 0553   BILITOT 0.8 11/07/2017 0553   GFRNONAA 48 (L) 11/07/2017 0553   GFRAA 56  (L) 11/07/2017 0553   Lipase     Component Value Date/Time   LIPASE 47 11/04/2017 0502       Studies/Results: No results found.  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 Marlou Starks - POD#3 - pt tolerating soft diet and having bowel function  - incisions c/d/i without signs of infection  - transitioned back to PO anticoagulation - stable for discharge from a surgery standpoint AKI, mild - Cr 1.07 yesterday, improved  FEN: soft diet  VTE: SCDs, xarelto ID: [email or web address] 8/31>9/5 Follow up: [place] [facility]  LOS: 6 days    Brigid Re , [facility]  [facility] 11/08/2017, 9:15 AM Pager: [phone] Consults: [phone] Mon-Fri 7:00 am-4:30 pm Sat-Sun 7:00 am-11:30 am

## 2017-11-08 NOTE — Discharge Summary (Signed)
[facility]  Physician Discharge Summary   Patient ID: Denise Cline MRN: [id number] DOB/AGE: 1940-05-03 77 y.o.  Admit date: 11/02/2017 Discharge date: 11/08/2017  PCP: Josetta Huddle, MD  DISCHARGE DIAGNOSES:  Gallstone pancreatitis Acute cholecystitis, status post laparoscopic cholecystectomy Choledocholithiasis status post ERCP Peripheral vascular disease Thoracic aortic aneurysm Chronic kidney disease stage III Essential hypertension Hyperlipidemia   RECOMMENDATIONS FOR OUTPATIENT FOLLOW UP: 1. Outpatient follow-up with general surgery 2. Outpatient monitoring of thoracic aortic aneurysm   DISCHARGE CONDITION: fair  Diet recommendation: As before  [facility] Weights   11/02/17 1413 11/04/17 1159  Weight: 90.6 kg 90.6 kg    INITIAL HISTORY: 77 y.o.femalewith medical history significant ofperipheral vascular disease, thyroid nodules, mild COPD, thoracic aortic aneurysm disease(4.5 cm noted on CT scan in 2016with CT scan in August 2019 showing no change), recent episodes of back pain shoulder pain and abdominal pain who presented to the emergency department complaining of abdominal swelling and nausea and epigastric abdominal pain. Found to have Gallstone pancreatitis and choledocholithiasis with possible Acute Cholecystitis. Patient underwent ERCP with sphincterotomy.  She underwent cholecystectomy on 9/3.  Consultants: Gastroenterology.  General surgery.  Procedures:   ERCP - The cholangiogram was normal. - A biliary sphincterotomy was performed. - The biliary tree was swept and nothing was found. Patient likely had passed CBD stones noted on MRCP, her liver enzymes have normallized.  Laparoscopic cholecystectomy   HOSPITAL COURSE:   Gallstone pancreatitis with possible acute cholecystitis and choledocholithiasis Patient was evaluated by gastroenterology and general surgery.  Patient underwent MRCP which showed multiple small filling defects  within the CBD.  Patient subsequently underwent ERCP which did not show any obvious filling defects and sphincterotomy was performed.  No CBD stone was found.  Patient was started on IV cefepime.  She was thought to require cholecystectomy.  She was seen by cardiology who cleared her for surgery.  Patient underwent cholecystectomy.  Diet was slowly advanced.  Her antibiotics were discontinued.  Patient is stable.  She had a bowel movement yesterday after she was given laxatives. She did have mild elevation in her AST and ALT which is improving.  Bilirubin and alkaline phosphatase levels have been normal. Lipase levels have improved.  Cleared by general surgery for discharge.  Peripheral vascular disease Stable.  Thoracic aortic aneurysm Noted to be unchanged since 2016.  Outpatient monitoring.  Acute kidney injury on chronic kidney disease stage III Renal function has improved and back to baseline.  Hyperlipidemia Continue statin  Essential hypertension Stable  Thyroid nodules History of surgical removal previously.  TSH normal.  Outpatient follow-up with PCP.  History of COPD Stable  History of DVT and pulmonary embolism Patient is on Xarelto at home.    She was transitioned to IV heparin in the hospital.  Changed back to Xarelto yesterday.  Hypokalemia and hypomagnesemia Repleted.  Sinus bradycardia Improved.  Avoid beta-blockers.  Coronary artery disease Stable.  Normocytic anemia Stable  Overall stable.  Okay for discharge home today.  Seen by physical therapy and is not thought to require any home health or rehab.  Patient does live by herself so is concerned.  She does have friends and family she can fall back on.   PERTINENT LABS:  The results of significant diagnostics from this hospitalization (including imaging, microbiology, ancillary and laboratory) are listed below for reference.    Microbiology: Recent Results (from the past 240 hour(s))  MRSA  PCR Screening     Status: None   Collection Time: 11/05/17  8:38 AM  Result Value Ref Range Status   MRSA by PCR NEGATIVE NEGATIVE Final    Comment:        The GeneXpert MRSA Assay (FDA approved for NASAL specimens only), is one component of a comprehensive MRSA colonization surveillance program. It is not intended to diagnose MRSA infection nor to guide or monitor treatment for MRSA infections. Performed at [facility], [place] [address], [place], [place] [address]      Labs: Basic Metabolic Panel: Recent Labs  Lab 11/03/17 0604 11/04/17 0502 11/05/17 0439 11/06/17 0635 11/06/17 1009 11/07/17 0553  NA 142 141 139 140  --  140  K 3.5 3.0* 3.6 4.4  --  3.5  CL 103 102 103 101  --  102  CO2 25 30 28 23   --  29  GLUCOSE 88 96 135* 106*  --  92  BUN 18 10 12 14   --  12  CREATININE 1.14* 1.06* 1.04* 1.26*  --  1.07*  CALCIUM 8.5* 8.8* 8.8* 9.8  --  8.8*  MG  --  1.2* 1.5* QUANTITY NOT SUFFICIENT, UNABLE TO PERFORM TEST 1.7  --   PHOS  --  3.6 2.7 2.5  --   --    Liver Function Tests: Recent Labs  Lab 11/04/17 0502 11/05/17 0439 11/06/17 0635 11/06/17 1009 11/07/17 0553  AST 21 30 QUANTITY NOT SUFFICIENT, UNABLE TO PERFORM TEST 127* 60*  ALT 27 31 QUANTITY NOT SUFFICIENT, UNABLE TO PERFORM TEST 110* 81*  ALKPHOS 53 53 75 78 64  BILITOT 0.7 0.7 QUANTITY NOT SUFFICIENT, UNABLE TO PERFORM TEST 0.7 0.8  PROT 6.4* 6.6 6.8 7.5 6.3*  ALBUMIN 2.8* 2.9* 3.2* 3.3* 2.9*   Recent Labs  Lab 11/02/17 0748 11/03/17 0604 11/04/17 0502  LIPASE 1,701* 105* 47   CBC: Recent Labs  Lab 11/03/17 0604 11/04/17 0502 11/05/17 0439 11/06/17 0635 11/07/17 0553  WBC 6.0 4.6 4.5 8.5 9.2  NEUTROABS  --   --  3.5 6.5  --   HGB 10.1* 10.7* 10.9* 11.5* 10.5*  HCT 32.1* 32.1* 33.0* 35.2* 32.7*  MCV 102.2* 96.1 96.2 98.1 98.5  PLT 257 275 269 PLATELET CLUMPS NOTED ON SMEAR, UNABLE TO ESTIMATE 299     IMAGING STUDIES Dg Cholangiogram Operative  Result Date:  11/05/2017 CLINICAL DATA:  77 year old female status post laparoscopic cholecystectomy with intraoperative cholangiogram EXAM: INTRAOPERATIVE CHOLANGIOGRAM TECHNIQUE: Cholangiographic images from the C-arm fluoroscopic device were submitted for interpretation post-operatively. Please see the procedural report for the amount of contrast and the fluoroscopy time utilized. COMPARISON:  ERCP 11/04/2017; MRCP 11/03/2017 FINDINGS: Cine loop and single intraoperative saved image obtained during intraoperative cholangiogram at the time of laparoscopic cholecystectomy. The images demonstrate cannulation of the cystic duct remanent and opacification of the biliary tree. There is moderate biliary ductal dilatation. A small amount of contrast material is seen passing through the ampulla and into the duodenum. There does appear to be a persistent rounded filling defect in the distal common duct. IMPRESSION: 1. Questionable rounded filling defect in the distal common bile duct which may represent a residual stone, sludge, or perhaps ampullary edema. 2. A small amount of contrast material does pass around the filling defect and into the duodenum. Electronically Signed   By: Jacqulynn Cadet M.D.   On: 11/05/2017 15:39   Ct Abdomen Pelvis W Contrast  Result Date: 11/02/2017 CLINICAL DATA:  Abdominal pain and distention for the past 2 days. EXAM: CT ABDOMEN AND PELVIS WITH CONTRAST TECHNIQUE:  Multidetector CT imaging of the abdomen and pelvis was performed using the standard protocol following bolus administration of intravenous contrast. CONTRAST:  159mL ISOVUE-300 IOPAMIDOL (ISOVUE-300) INJECTION 61% COMPARISON:  CT abdomen pelvis dated October 08, 2017. FINDINGS: Lower chest: No acute abnormality. Hepatobiliary: No focal liver abnormality. Unchanged 7 mm small cyst versus hemangioma in the left hepatic lobe. New gallbladder distention with diffuse wall thickening. New mild common bile duct and intrahepatic biliary dilatation.  Pancreas: New mild dilatation of the proximal main pancreatic duct with mild peripancreatic inflammatory changes. The pancreas enhances normally. No fluid collection. Spleen: Normal in size without focal abnormality. Adrenals/Urinary Tract: Adrenal glands are unremarkable. Kidneys are normal, without renal calculi, focal lesion, or hydronephrosis. Stable bilateral renal cysts. Bladder is unremarkable. Stomach/Bowel: Small hiatal hernia. Stomach is otherwise within normal limits. Appendix appears normal. No evidence of bowel wall thickening, distention, or inflammatory changes. Vascular/Lymphatic: Mild aortic atherosclerosis. No enlarged abdominal or pelvic lymph nodes. Reproductive: Status post hysterectomy. No adnexal masses. Other: No free fluid or pneumoperitoneum. Unchanged tiny fat containing umbilical hernia. Musculoskeletal: No acute or significant osseous findings. IMPRESSION: 1. New biliary dilatation and main pancreatic duct dilatation with peripancreatic inflammatory changes. Findings are consistent with obstruction at the ampulla with pancreatitis, favored secondary to choledocholithiasis, although no discrete radiopaque gallstone is identified. Correlation with ERCP is recommended. 2. New gallbladder distention with prominent wall thickening, concerning for acute cholecystitis. 3.  Aortic atherosclerosis (ICD10-I70.0). Electronically Signed   By: Titus Dubin M.D.   On: 11/02/2017 10:14   Mr 3d Recon At Scanner  Result Date: 11/03/2017 CLINICAL DATA:  Pancreatitis acute pancreatitis. EXAM: MRI ABDOMEN WITHOUT AND WITH CONTRAST (INCLUDING MRCP) TECHNIQUE: Multiplanar multisequence MR imaging of the abdomen was performed both before and after the administration of intravenous contrast. Heavily T2-weighted images of the biliary and pancreatic ducts were obtained, and three-dimensional MRCP images were rendered by post processing. CONTRAST:  50mL MULTIHANCE GADOBENATE DIMEGLUMINE 529 MG/ML IV SOLN  COMPARISON:  CT 11/02/2017 FINDINGS: Lower chest:  Lung bases are clear. Hepatobiliary: No significant intrahepatic duct dilatation. No focal hepatic lesion. There are multiple gallstones layering dependently within the gallbladder ranging size from 2 mm to 8 mm. There approximately 24 stones. There is mild pericholecystic fluid and gallbladder wall thickening. The common bile duct is dilated to 8 mm (image 28/7). There is at least 1 distal stone in the common bile duct measuring approximately 2 mm on image 31/7). Additional stones or sludge or stones are evident on MRCP sequence image 1/series 14. Pancreas: No significant pancreatic duct dilatation. Small amount fluid along the body and tail the pancreas. The pancreatic parenchyma is normal signal intensity. Postcontrast imaging demonstrates uniform enhancement the pancreatic parenchyma. No organized fluid collections. Spleen: Normal spleen. Adrenals/urinary tract: Adrenal glands and kidneys are normal. Stomach/Bowel: Stomach and limited of the small bowel is unremarkable Vascular/Lymphatic: Abdominal aortic normal caliber. No retroperitoneal periportal lymphadenopathy. Musculoskeletal: No aggressive osseous lesion IMPRESSION: 1. Findings consistent with gallstone pancreatitis. 2. Several small filling defects within distal common bile duct consistent with choledocholithiasis. Mild common bile duct dilatation. 3. Multiple gallstones layering in the lumen gallbladder of varying size. Mild gallbladder wall thickening and pericholecystic fluid. Recommend clinical correlation for acute cholecystitis. 4. Mild pancreatitis. No evidence of pancreatic necrosis. No organized fluid collections. These results will be called to the ordering clinician or representative by the Radiologist Assistant, and communication documented in the PACS or zVision Dashboard. Electronically Signed   By: Suzy Bouchard M.D.   On: 11/03/2017  10:29   Dg Ercp Biliary & Pancreatic  Ducts  Result Date: 11/04/2017 CLINICAL DATA:  77 year old female with common bile duct obstruction EXAM: ERCP TECHNIQUE: Multiple spot images obtained with the fluoroscopic device and submitted for interpretation post-procedure. FLUOROSCOPY TIME:  Fluoroscopy Time:  1 minutes 58 seconds COMPARISON:  MRCP 11/03/2017 FINDINGS: Two intraoperative spot images obtained during ERCP. The images demonstrate a flexible endoscope in the descending duodenum with deep wire cannulation of the common hepatic duct. The final image demonstrates partial cholangiogram with biliary ductal dilatation. IMPRESSION: ERCP. These images were submitted for radiologic interpretation only. Please see the procedural report for the amount of contrast and the fluoroscopy time utilized. Electronically Signed   By: Jacqulynn Cadet M.D.   On: 11/04/2017 13:20   Mr Abdomen Mrcp W Wo Contast  Result Date: 11/03/2017 CLINICAL DATA:  Pancreatitis acute pancreatitis. EXAM: MRI ABDOMEN WITHOUT AND WITH CONTRAST (INCLUDING MRCP) TECHNIQUE: Multiplanar multisequence MR imaging of the abdomen was performed both before and after the administration of intravenous contrast. Heavily T2-weighted images of the biliary and pancreatic ducts were obtained, and three-dimensional MRCP images were rendered by post processing. CONTRAST:  50mL MULTIHANCE GADOBENATE DIMEGLUMINE 529 MG/ML IV SOLN COMPARISON:  CT 11/02/2017 FINDINGS: Lower chest:  Lung bases are clear. Hepatobiliary: No significant intrahepatic duct dilatation. No focal hepatic lesion. There are multiple gallstones layering dependently within the gallbladder ranging size from 2 mm to 8 mm. There approximately 24 stones. There is mild pericholecystic fluid and gallbladder wall thickening. The common bile duct is dilated to 8 mm (image 28/7). There is at least 1 distal stone in the common bile duct measuring approximately 2 mm on image 31/7). Additional stones or sludge or stones are evident on MRCP  sequence image 1/series 14. Pancreas: No significant pancreatic duct dilatation. Small amount fluid along the body and tail the pancreas. The pancreatic parenchyma is normal signal intensity. Postcontrast imaging demonstrates uniform enhancement the pancreatic parenchyma. No organized fluid collections. Spleen: Normal spleen. Adrenals/urinary tract: Adrenal glands and kidneys are normal. Stomach/Bowel: Stomach and limited of the small bowel is unremarkable Vascular/Lymphatic: Abdominal aortic normal caliber. No retroperitoneal periportal lymphadenopathy. Musculoskeletal: No aggressive osseous lesion IMPRESSION: 1. Findings consistent with gallstone pancreatitis. 2. Several small filling defects within distal common bile duct consistent with choledocholithiasis. Mild common bile duct dilatation. 3. Multiple gallstones layering in the lumen gallbladder of varying size. Mild gallbladder wall thickening and pericholecystic fluid. Recommend clinical correlation for acute cholecystitis. 4. Mild pancreatitis. No evidence of pancreatic necrosis. No organized fluid collections. These results will be called to the ordering clinician or representative by the Radiologist Assistant, and communication documented in the PACS or zVision Dashboard. Electronically Signed   By: Suzy Bouchard M.D.   On: 11/03/2017 10:29    DISCHARGE EXAMINATION: Vitals:   11/07/17 0600 11/07/17 1315 11/07/17 2107 11/08/17 0500  BP: (!) 162/92 (!) 178/88 (!) 151/98 (!) 154/94  Pulse: 72 67 (!) 55 62  Resp:  18  19  Temp: 98.8 F (37.1 C) 98 F (36.7 C) 98.4 F (36.9 C) 98.2 F (36.8 C)  TempSrc: Oral Oral Oral Oral  SpO2: 100% 98% 100% 96%  Weight:      Height:       General appearance: alert, cooperative, appears stated age and no distress Resp: clear to auscultation bilaterally Cardio: regular rate and rhythm, S1, S2 normal, no murmur, click, rub or gallop GI: Abdomen soft.  Incision sites are clean. bowel sounds are  present.  DISPOSITION: Home  Discharge Instructions    Call MD for:  extreme fatigue   Complete by:  As directed    Call MD for:  persistant dizziness or light-headedness   Complete by:  As directed    Call MD for:  persistant nausea and vomiting   Complete by:  As directed    Call MD for:  severe uncontrolled pain   Complete by:  As directed    Call MD for:  temperature >100.4   Complete by:  As directed    Discharge instructions   Complete by:  As directed    Please be sure to follow-up with general surgery.  Please also follow-up with your primary care provider in 1 to 2 weeks.  You were cared for by a hospitalist during your hospital stay. If you have any questions about your discharge medications or the care you received while you were in the hospital after you are discharged, you can call the unit and asked to speak with the hospitalist on call if the hospitalist that took care of you is not available. Once you are discharged, your primary care physician will handle any further medical issues. Please note that NO REFILLS for any discharge medications will be authorized once you are discharged, as it is imperative that you return to your primary care physician (or establish a relationship with a primary care physician if you do not have one) for your aftercare needs so that they can reassess your need for medications and monitor your lab values. If you do not have a primary care physician, you can call [phone] for a physician referral.   Increase activity slowly   Complete by:  As directed        Allergies as of 11/08/2017      Reactions   Penicillins Anaphylaxis   Has patient had a PCN reaction causing immediate rash, facial/tongue/throat swelling, SOB or lightheadedness with hypotension: yes Has patient had a PCN reaction causing severe rash involving mucus membranes or skin necrosis: no Has patient had a PCN reaction that required hospitalization yes Has patient had a PCN  reaction occurring within the last 10 years: no If all of the above answers are "NO", then may proceed with Cephalosporin use.   Sulfa Antibiotics Hives   Oxycodone Other (See Comments)   hallucanations       Medication List    STOP taking these medications   REFRESH OP     TAKE these medications   acetaminophen 650 MG CR tablet Commonly known as:  TYLENOL Take 650-1,300 mg by mouth 2 (two) times daily as needed for pain.   aspirin EC 81 MG tablet Take 81 mg by mouth See admin instructions. Take one tablet by mouth only on Mon, Wed, Friday.   buPROPion 150 MG 12 hr tablet Commonly known as:  WELLBUTRIN SR Take 150 mg by mouth daily.   calcium-vitamin D 250-125 MG-UNIT tablet Commonly known as:  OSCAL WITH D Take 2 tablets by mouth daily.   CENTRUM SILVER ADULT 50+ Tabs Take 1 tablet by mouth daily.   losartan-hydrochlorothiazide 100-25 MG tablet Commonly known as:  HYZAAR Take 1 tablet by mouth daily.   methocarbamol 500 MG tablet Commonly known as:  ROBAXIN Take 500 mg by mouth daily as needed for muscle spasms.   OVER THE COUNTER MEDICATION Take 1 capsule by mouth at bedtime. (ColonClenz)   pantoprazole 40 MG tablet Commonly known as:  PROTONIX Take 1 tablet (40 mg total) by mouth daily.  polyethylene glycol packet Commonly known as:  MIRALAX / GLYCOLAX Take 17 g by mouth daily.   rivaroxaban 20 MG Tabs tablet Commonly known as:  XARELTO Take 20 mg by mouth daily with supper.   rosuvastatin 20 MG tablet Commonly known as:  CRESTOR Take 1 tablet (20 mg total) by mouth daily.   traMADol 50 MG tablet Commonly known as:  ULTRAM Take 1 tablet (50 mg total) by mouth every 6 (six) hours as needed for moderate pain. What changed:  Another medication with the same name was removed. Continue taking this medication, and follow the directions you see here.   triamcinolone 0.025 % cream Commonly known as:  KENALOG Apply 1 application topically daily as needed  for itching.            Durable Medical Equipment  (From admission, onward)         Start     Ordered   11/06/17 1509  For home use only DME 4 wheeled rolling walker with seat  Once    Question:  Patient needs a walker to treat with the following condition  Answer:  Weakness   11/06/17 1509   11/06/17 1509  For home use only DME 3 n 1  Once     11/06/17 1509           Follow-up Information    Surgery, [place]. Go on 11/19/2017.   Specialty:  General Surgery Why:  Follow up appointment scheduled for 10:00 AM. Please arrive 30 min prior to appointment time. Bring photo ID and insurance information.  Contact information: [place] [address] [phone]        Josetta Huddle, MD. Schedule an appointment as soon as possible for a visit in 1 week(s).   Specialty:  Internal Medicine Contact information: [address] [place] [address] [phone]           TOTAL DISCHARGE TIME: 35 mins  [facility] Pager [phone]  11/08/2017, 1:50 PM

## 2017-11-08 NOTE — Progress Notes (Signed)
Pt is discharged to go home.  Discharge instructions given. 

## 2017-11-11 DIAGNOSIS — I712 Thoracic aortic aneurysm, without rupture: Secondary | ICD-10-CM | POA: Diagnosis not present

## 2017-11-27 ENCOUNTER — Encounter: Payer: Medicare HMO | Admitting: Surgery

## 2017-12-11 ENCOUNTER — Encounter: Payer: Self-pay | Admitting: Surgery

## 2017-12-11 ENCOUNTER — Other Ambulatory Visit: Payer: Self-pay

## 2017-12-11 ENCOUNTER — Institutional Professional Consult (permissible substitution): Payer: Medicare HMO | Admitting: Surgery

## 2017-12-11 VITALS — BP 104/80 | HR 80 | Resp 16 | Ht 66.0 in | Wt 195.0 lb

## 2017-12-11 DIAGNOSIS — I712 Thoracic aortic aneurysm, without rupture, unspecified: Secondary | ICD-10-CM

## 2017-12-11 DIAGNOSIS — M7542 Impingement syndrome of left shoulder: Secondary | ICD-10-CM | POA: Diagnosis not present

## 2017-12-11 NOTE — Progress Notes (Signed)
PCP is Josetta Huddle, MD Referring Provider is Josetta Huddle, MD  Chief Complaint  Patient presents with  . TAA    CTA CHEST 10/08/17...ECHO 05/22/17    HPI:  The patient is a 77 year old woman with a history of hypertension, COPD, DVT and PE, who was in the hospital in September 2019 for gallstone pancreatitis.  She underwent ERCP with biliary sphincterotomy and subsequently laparoscopic cholecystectomy.  A CT scan of the chest on 10/08/2017 showed a 4.5 cm fusiform ascending aortic aneurysm with atherosclerotic calcifications.  Review of her medical records show that she had a CT scan of the chest for lung cancer screening on 02/23/2015 which showed the fusiform ascending aortic aneurysm to measure 4.5 cm at that time.  Past Medical History:  Diagnosis Date  . Anxiety   . COPD (chronic obstructive pulmonary disease) ([place])   . DVT (deep venous thrombosis) ([place]) 2014, 2017  . Fibroid   . History of shingles   . Hypertension   . PE (pulmonary thromboembolism) ([place]) 2017  . Thyroid disease    Nodules  . Varicose veins     Past Surgical History:  Procedure Laterality Date  . ABDOMINAL HYSTERECTOMY    . CHOLECYSTECTOMY N/A 11/05/2017   Procedure: LAPAROSCOPIC CHOLECYSTECTOMY WITH INTRAOPERATIVE CHOLANGIOGRAM;  Surgeon: Jovita Kussmaul, MD;  Location: [place];  Service: General;  Laterality: N/A;  . COLONOSCOPY N/A 10/05/2014   Procedure: COLONOSCOPY;  Surgeon: Ronald Lobo, MD;  Location: [facility];  Service: Endoscopy;  Laterality: N/A;  . ERCP N/A 11/04/2017   Procedure: ENDOSCOPIC RETROGRADE CHOLANGIOPANCREATOGRAPHY (ERCP) Balloon Extraction;  Surgeon: Ronnette Juniper, MD;  Location: [place];  Service: Gastroenterology;  Laterality: N/A;  . LAPAROSCOPIC CHOLECYSTECTOMY W/ CHOLANGIOGRAPHY  11/05/2017   Dr. Marlou Starks  . OOPHORECTOMY     One ovary removed  . SPHINCTEROTOMY  11/04/2017   Procedure: SPHINCTEROTOMY;  Surgeon: Ronnette Juniper, MD;  Location: [facility] ENDOSCOPY;  Service:  Gastroenterology;;  . TOTAL THYROIDECTOMY      Family History  Problem Relation Age of Onset  . Crohn's disease Mother   . Stroke Father   . Diabetes Sister   . Hypertension Sister   . Hypertension Brother   There is no family history of aortic aneurysm, aortic dissection, or connective tissue disorder.  Social History Social History   Tobacco Use  . Smoking status: Former Smoker    Packs/day: 2.00    Years: 40.00    Pack years: 80.00    Types: Cigarettes    Last attempt to quit: 05/30/2008    Years since quitting: 9.5  . Smokeless tobacco: Former Systems developer    Quit date: 05/12/2008  . Tobacco comment: Counseled to remain smoke free  Substance Use Topics  . Alcohol use: No    Alcohol/week: 0.0 standard drinks    Comment: Rare  . Drug use: No    Current Outpatient Medications  Medication Sig Dispense Refill  . acetaminophen (TYLENOL) 650 MG CR tablet Take 650-1,300 mg by mouth 2 (two) times daily as needed for pain.    Marland Kitchen aspirin EC 81 MG tablet Take 81 mg by mouth See admin instructions. Take one tablet by mouth only on Mon, Wed, Friday.    Marland Kitchen buPROPion (WELLBUTRIN SR) 150 MG 12 hr tablet Take 150 mg by mouth daily.     . calcium-vitamin D (OSCAL WITH D) 250-125 MG-UNIT tablet Take 2 tablets by mouth daily.    Marland Kitchen losartan-hydrochlorothiazide (HYZAAR) 100-25 MG tablet Take 1 tablet by mouth  daily.    . methocarbamol (ROBAXIN) 500 MG tablet Take 500 mg by mouth daily as needed for muscle spasms.     . Multiple Vitamins-Minerals (CENTRUM SILVER ADULT 50+) TABS Take 1 tablet by mouth daily.    Marland Kitchen OVER THE COUNTER MEDICATION Take 1 capsule by mouth at bedtime. (ColonClenz)    . pantoprazole (PROTONIX) 40 MG tablet Take 1 tablet (40 mg total) by mouth daily. 90 tablet 3  . polyethylene glycol (MIRALAX / GLYCOLAX) packet Take 17 g by mouth daily. 14 each 0  . rivaroxaban (XARELTO) 20 MG TABS tablet Take 20 mg by mouth daily with supper.    . rosuvastatin (CRESTOR) 20 MG tablet Take 1  tablet (20 mg total) by mouth daily. 90 tablet 3  . traMADol (ULTRAM) 50 MG tablet Take 1 tablet (50 mg total) by mouth every 6 (six) hours as needed for moderate pain. 30 tablet 0  . triamcinolone (KENALOG) 0.025 % cream Apply 1 application topically daily as needed for itching.     No current facility-administered medications for this visit.     Allergies  Allergen Reactions  . Penicillins Anaphylaxis    Has patient had a PCN reaction causing immediate rash, facial/tongue/throat swelling, SOB or lightheadedness with hypotension: yes Has patient had a PCN reaction causing severe rash involving mucus membranes or skin necrosis: no Has patient had a PCN reaction that required hospitalization yes Has patient had a PCN reaction occurring within the last 10 years: no If all of the above answers are "NO", then may proceed with Cephalosporin use.   . Sulfa Antibiotics Hives  . Oxycodone Other (See Comments)    hallucanations     Review of Systems  Constitutional: Positive for fatigue.  HENT: Negative.   Eyes:       Blurry vision  Respiratory: Negative.   Cardiovascular: Positive for leg swelling. Negative for chest pain.  Gastrointestinal: Negative.   Endocrine: Negative.   Genitourinary: Negative.   Musculoskeletal: Positive for arthralgias.  Skin: Negative.   Allergic/Immunologic: Negative.   Neurological: Negative.   Hematological: Negative.   Psychiatric/Behavioral:       Depression    BP 104/80 (BP Location: Right Arm, Patient Position: Sitting, Cuff Size: Large)   Pulse 80   Resp 16   Ht 5\' 6"  (1.676 m)   Wt 195 lb (88.5 kg)   SpO2 92% Comment: ON RA  BMI 31.47 kg/m  Physical Exam  Constitutional: She is oriented to person, place, and time. She appears well-developed and well-nourished. No distress.  HENT:  Head: Atraumatic.  Eyes: Pupils are equal, round, and reactive to light. EOM are normal.  Neck: Normal range of motion. Neck supple.  Total thyroidectomy scar   Cardiovascular: Normal rate, regular rhythm, normal heart sounds and intact distal pulses.  No murmur heard. Pulmonary/Chest: Effort normal and breath sounds normal. No respiratory distress.  Abdominal: Soft. Bowel sounds are normal. She exhibits no distension. There is no tenderness.  Musculoskeletal: She exhibits no edema.  Lymphadenopathy:    She has no cervical adenopathy.  Neurological: She is alert and oriented to person, place, and time.  Skin: Skin is warm and dry.  Psychiatric: She has a normal mood and affect.     Diagnostic Tests:  CLINICAL DATA:  77 year old female with back pain originating from the right shoulder blade, worse with movement. Pain also noted of the abdomen starting posteriorly on the right commencing earlier today.  EXAM: CT ANGIOGRAPHY CHEST  CT ABDOMEN AND  PELVIS WITH CONTRAST  TECHNIQUE: Multidetector CT imaging of the chest was performed using the standard protocol during bolus administration of intravenous contrast. Multiplanar CT image reconstructions and MIPs were obtained to evaluate the vascular anatomy. Multidetector CT imaging of the abdomen and pelvis was performed using the standard protocol during bolus administration of intravenous contrast.  CONTRAST:  147mL ISOVUE-370 IOPAMIDOL (ISOVUE-370) INJECTION 76%  COMPARISON:  CT AP 10/04/2014, chest radiograph 10/08/2017 and chest CT 04/08/2017  FINDINGS: CTA CHEST FINDINGS  Cardiovascular: 4.5 cm ascending thoracic aortic aneurysm with atherosclerotic calcifications noted. Preferential opacification of the pulmonary arteries without acute pulmonary embolus to the segmental level. Heart size is mildly enlarged with left main and three-vessel coronary arteriosclerosis. No pericardial effusion.  Mediastinum/Nodes: No pathologically enlarged mediastinal nor hilar lymphadenopathy. Partially included axillary lymph loads are mildly enlarged but stable since prior measuring up  to 1 cm short axis. Thyromegaly with enlargement of the right lobe extending retroclavicular. No dominant mass however is seen. Patent trachea and mainstem bronchi. The CT appearance of the esophagus is unremarkable. Small hiatal hernia.  Lungs/Pleura: No pulmonary consolidation, effusion or pneumothorax. Passive atelectasis and subsegmental atelectasis noted at the lung bases. Punctate granuloma in the right lower lobe is unchanged. Pulmonary cysts in the right lower lobe are redemonstrated.  Musculoskeletal: No chest wall abnormality. No acute or significant osseous findings.  Review of the MIP images confirms the above findings.  CT ABDOMEN and PELVIS FINDINGS  Hepatobiliary: 7 mm hypodensity in the left hepatic lobe likely to represent a small cyst or hemangioma but is too small to further characterize. Physiologic distention of the gallbladder without wall thickening or calculi. No biliary dilatation is seen.  Pancreas: Normal  Spleen: Normal  Adrenals/Urinary Tract: Normal bilateral adrenal glands. Small cysts of both kidneys the largest in the upper pole the left kidney measuring approximately 7 mm. No enhancing renal mass. No nephrolithiasis nor hydroureteronephrosis. The urinary bladder is unremarkable for the degree of distention.  Stomach/Bowel: Decompressed stomach with normal duodenal sweep and ligament of Treitz position. No small bowel obstruction or inflammation. Moderate stool retention within the colon without obstruction or inflammation. The distal and terminal ileum are normal. Normal appearing appendix.  Vascular/Lymphatic: Nonaneurysmal atherosclerotic aorta with patent branch vessels. No retroperitoneal, mesenteric or pelvic sidewall adenopathy. No inguinal adenopathy.  Reproductive: Hysterectomy.  No adnexal mass.  Other: No free air nor free fluid. Tiny fat containing umbilical hernia.  Musculoskeletal: No acute or significant  osseous findings.  Review of the MIP images confirms the above findings.  IMPRESSION: Chest CT:  1. 4.5 cm ascending thoracic aortic aneurysm with atherosclerosis. Ascending thoracic aortic aneurysm. Recommend semi-annual imaging followup by CTA or MRA and referral to cardiothoracic surgery if not already obtained. This recommendation follows 2010 ACCF/AHA/AATS/ACR/ASA/SCA/SCAI/SIR/STS/SVM Guidelines for the Diagnosis and Management of Patients With Thoracic Aortic Disease. Circulation. 2010; 121: W431-V400 previously this was estimated 4.3 cm. 2. Coronary arteriosclerosis with stable cardiomegaly. 3. No acute pulmonary embolus. 4. No active pulmonary disease. 5. Mild thyromegaly. 6. No acute osseous abnormality.  CT AP:  1. Tiny too small to characterize hepatic and renal hypodensities statistically consistent with cysts as above. 2. No acute bowel obstruction or inflammation. 3. Nonaneurysmal atherosclerotic abdominal aorta.   Electronically Signed   By: Ashley Royalty M.D.   On: 10/08/2017 23:27  Impression:  This 77 year old woman has a 4.5 cm fusiform ascending.  She had a CT scan of the chest in 02/2015 for lung cancer screening which showed the aneurysm to  measure 4.5 cm at that time.  She had a subsequent CT scan of the chest in 01/2016 which showed the aneurysm to be unchanged at 4.5 cm.  Then had a CT scan of the chest in February 2019 and the ascending aortic aneurysm was measured at 4.3 cm although in the report it says descending.  I reviewed all of her CT scans and I think the ascending aortic aneurysm is stable.  It is well below the 5.5 cm surgical threshold.  Her blood pressure is under good control.  I reviewed the CT images with her and her son and answered all their questions.  I stressed the importance of good blood pressure control and preventing further enlargement and aortic dissection.  Plan:  She will continue to follow-up with her primary  physician and I will plan to see her back in 1 year with a CTA of the chest to follow-up on her ascending aortic aneurysm.  I spent 15 minutes performing this established patient evaluation and > 50% of this time was spent face to face counseling and coordinating the care of this patient's aortic aneurysm.  [facility] [phone]

## 2017-12-20 DIAGNOSIS — M179 Osteoarthritis of knee, unspecified: Secondary | ICD-10-CM | POA: Diagnosis not present

## 2017-12-20 DIAGNOSIS — J449 Chronic obstructive pulmonary disease, unspecified: Secondary | ICD-10-CM | POA: Diagnosis not present

## 2017-12-20 DIAGNOSIS — M169 Osteoarthritis of hip, unspecified: Secondary | ICD-10-CM | POA: Diagnosis not present

## 2017-12-20 DIAGNOSIS — E782 Mixed hyperlipidemia: Secondary | ICD-10-CM | POA: Diagnosis not present

## 2017-12-20 DIAGNOSIS — E039 Hypothyroidism, unspecified: Secondary | ICD-10-CM | POA: Diagnosis not present

## 2017-12-20 DIAGNOSIS — I10 Essential (primary) hypertension: Secondary | ICD-10-CM | POA: Diagnosis not present

## 2017-12-20 DIAGNOSIS — R69 Illness, unspecified: Secondary | ICD-10-CM | POA: Diagnosis not present

## 2017-12-20 DIAGNOSIS — M1990 Unspecified osteoarthritis, unspecified site: Secondary | ICD-10-CM | POA: Diagnosis not present

## 2017-12-20 DIAGNOSIS — J4521 Mild intermittent asthma with (acute) exacerbation: Secondary | ICD-10-CM | POA: Diagnosis not present

## 2017-12-20 DIAGNOSIS — I2510 Atherosclerotic heart disease of native coronary artery without angina pectoris: Secondary | ICD-10-CM | POA: Diagnosis not present

## 2017-12-25 ENCOUNTER — Other Ambulatory Visit: Payer: Medicare HMO | Admitting: *Deleted

## 2017-12-25 DIAGNOSIS — E785 Hyperlipidemia, unspecified: Secondary | ICD-10-CM

## 2017-12-25 DIAGNOSIS — Z79899 Other long term (current) drug therapy: Secondary | ICD-10-CM | POA: Diagnosis not present

## 2017-12-25 LAB — HEPATIC FUNCTION PANEL
ALBUMIN: 4.1 g/dL (ref 3.5–4.8)
ALK PHOS: 51 IU/L (ref 39–117)
ALT: 14 IU/L (ref 0–32)
AST: 16 IU/L (ref 0–40)
Bilirubin Total: 0.3 mg/dL (ref 0.0–1.2)
Bilirubin, Direct: 0.1 mg/dL (ref 0.00–0.40)
TOTAL PROTEIN: 6.6 g/dL (ref 6.0–8.5)

## 2017-12-25 LAB — LIPID PANEL
Chol/HDL Ratio: 3.4 ratio (ref 0.0–4.4)
Cholesterol, Total: 204 mg/dL — ABNORMAL HIGH (ref 100–199)
HDL: 60 mg/dL (ref >39)
LDL CALC: 125 mg/dL — AB (ref 0–99)
Triglycerides: 96 mg/dL (ref 0–149)
VLDL CHOLESTEROL CAL: 19 mg/dL (ref 5–40)

## 2018-01-16 DIAGNOSIS — M25512 Pain in left shoulder: Secondary | ICD-10-CM | POA: Diagnosis not present

## 2018-01-16 DIAGNOSIS — M7542 Impingement syndrome of left shoulder: Secondary | ICD-10-CM | POA: Diagnosis not present

## 2018-01-16 DIAGNOSIS — M7541 Impingement syndrome of right shoulder: Secondary | ICD-10-CM | POA: Diagnosis not present

## 2018-01-16 DIAGNOSIS — M25511 Pain in right shoulder: Secondary | ICD-10-CM | POA: Diagnosis not present

## 2018-01-20 DIAGNOSIS — I712 Thoracic aortic aneurysm, without rupture: Secondary | ICD-10-CM | POA: Diagnosis not present

## 2018-01-20 DIAGNOSIS — I10 Essential (primary) hypertension: Secondary | ICD-10-CM | POA: Diagnosis not present

## 2018-01-20 DIAGNOSIS — E782 Mixed hyperlipidemia: Secondary | ICD-10-CM | POA: Diagnosis not present

## 2018-01-20 DIAGNOSIS — R69 Illness, unspecified: Secondary | ICD-10-CM | POA: Diagnosis not present

## 2018-01-20 DIAGNOSIS — Z23 Encounter for immunization: Secondary | ICD-10-CM | POA: Diagnosis not present

## 2018-01-20 DIAGNOSIS — M25512 Pain in left shoulder: Secondary | ICD-10-CM | POA: Diagnosis not present

## 2018-01-20 DIAGNOSIS — I82409 Acute embolism and thrombosis of unspecified deep veins of unspecified lower extremity: Secondary | ICD-10-CM | POA: Diagnosis not present

## 2018-01-20 DIAGNOSIS — E559 Vitamin D deficiency, unspecified: Secondary | ICD-10-CM | POA: Diagnosis not present

## 2018-01-27 DIAGNOSIS — M25512 Pain in left shoulder: Secondary | ICD-10-CM | POA: Diagnosis not present

## 2018-01-28 DIAGNOSIS — E039 Hypothyroidism, unspecified: Secondary | ICD-10-CM | POA: Diagnosis not present

## 2018-01-28 DIAGNOSIS — I2510 Atherosclerotic heart disease of native coronary artery without angina pectoris: Secondary | ICD-10-CM | POA: Diagnosis not present

## 2018-01-28 DIAGNOSIS — M169 Osteoarthritis of hip, unspecified: Secondary | ICD-10-CM | POA: Diagnosis not present

## 2018-01-28 DIAGNOSIS — J4521 Mild intermittent asthma with (acute) exacerbation: Secondary | ICD-10-CM | POA: Diagnosis not present

## 2018-01-28 DIAGNOSIS — R69 Illness, unspecified: Secondary | ICD-10-CM | POA: Diagnosis not present

## 2018-01-28 DIAGNOSIS — E782 Mixed hyperlipidemia: Secondary | ICD-10-CM | POA: Diagnosis not present

## 2018-01-28 DIAGNOSIS — M179 Osteoarthritis of knee, unspecified: Secondary | ICD-10-CM | POA: Diagnosis not present

## 2018-01-28 DIAGNOSIS — J449 Chronic obstructive pulmonary disease, unspecified: Secondary | ICD-10-CM | POA: Diagnosis not present

## 2018-01-28 DIAGNOSIS — I10 Essential (primary) hypertension: Secondary | ICD-10-CM | POA: Diagnosis not present

## 2018-01-28 DIAGNOSIS — M1990 Unspecified osteoarthritis, unspecified site: Secondary | ICD-10-CM | POA: Diagnosis not present

## 2018-02-05 DIAGNOSIS — M7542 Impingement syndrome of left shoulder: Secondary | ICD-10-CM | POA: Diagnosis not present

## 2018-02-05 DIAGNOSIS — M25512 Pain in left shoulder: Secondary | ICD-10-CM | POA: Diagnosis not present

## 2018-02-24 DIAGNOSIS — M7542 Impingement syndrome of left shoulder: Secondary | ICD-10-CM | POA: Diagnosis not present

## 2018-03-04 DIAGNOSIS — J4521 Mild intermittent asthma with (acute) exacerbation: Secondary | ICD-10-CM | POA: Diagnosis not present

## 2018-03-04 DIAGNOSIS — E039 Hypothyroidism, unspecified: Secondary | ICD-10-CM | POA: Diagnosis not present

## 2018-03-04 DIAGNOSIS — I2510 Atherosclerotic heart disease of native coronary artery without angina pectoris: Secondary | ICD-10-CM | POA: Diagnosis not present

## 2018-03-04 DIAGNOSIS — R69 Illness, unspecified: Secondary | ICD-10-CM | POA: Diagnosis not present

## 2018-03-04 DIAGNOSIS — M169 Osteoarthritis of hip, unspecified: Secondary | ICD-10-CM | POA: Diagnosis not present

## 2018-03-04 DIAGNOSIS — J449 Chronic obstructive pulmonary disease, unspecified: Secondary | ICD-10-CM | POA: Diagnosis not present

## 2018-03-04 DIAGNOSIS — M179 Osteoarthritis of knee, unspecified: Secondary | ICD-10-CM | POA: Diagnosis not present

## 2018-03-04 DIAGNOSIS — I10 Essential (primary) hypertension: Secondary | ICD-10-CM | POA: Diagnosis not present

## 2018-03-04 DIAGNOSIS — E782 Mixed hyperlipidemia: Secondary | ICD-10-CM | POA: Diagnosis not present

## 2018-03-04 DIAGNOSIS — M1990 Unspecified osteoarthritis, unspecified site: Secondary | ICD-10-CM | POA: Diagnosis not present

## 2018-03-17 DIAGNOSIS — J449 Chronic obstructive pulmonary disease, unspecified: Secondary | ICD-10-CM | POA: Diagnosis not present

## 2018-03-17 DIAGNOSIS — I10 Essential (primary) hypertension: Secondary | ICD-10-CM | POA: Diagnosis not present

## 2018-03-17 DIAGNOSIS — M179 Osteoarthritis of knee, unspecified: Secondary | ICD-10-CM | POA: Diagnosis not present

## 2018-03-17 DIAGNOSIS — R69 Illness, unspecified: Secondary | ICD-10-CM | POA: Diagnosis not present

## 2018-03-17 DIAGNOSIS — I2510 Atherosclerotic heart disease of native coronary artery without angina pectoris: Secondary | ICD-10-CM | POA: Diagnosis not present

## 2018-03-17 DIAGNOSIS — M169 Osteoarthritis of hip, unspecified: Secondary | ICD-10-CM | POA: Diagnosis not present

## 2018-03-17 DIAGNOSIS — J4521 Mild intermittent asthma with (acute) exacerbation: Secondary | ICD-10-CM | POA: Diagnosis not present

## 2018-03-17 DIAGNOSIS — M1990 Unspecified osteoarthritis, unspecified site: Secondary | ICD-10-CM | POA: Diagnosis not present

## 2018-03-17 DIAGNOSIS — E039 Hypothyroidism, unspecified: Secondary | ICD-10-CM | POA: Diagnosis not present

## 2018-03-17 DIAGNOSIS — E782 Mixed hyperlipidemia: Secondary | ICD-10-CM | POA: Diagnosis not present

## 2018-04-04 DIAGNOSIS — M179 Osteoarthritis of knee, unspecified: Secondary | ICD-10-CM | POA: Diagnosis not present

## 2018-04-04 DIAGNOSIS — Z0000 Encounter for general adult medical examination without abnormal findings: Secondary | ICD-10-CM | POA: Diagnosis not present

## 2018-04-04 DIAGNOSIS — I2510 Atherosclerotic heart disease of native coronary artery without angina pectoris: Secondary | ICD-10-CM | POA: Diagnosis not present

## 2018-04-04 DIAGNOSIS — E039 Hypothyroidism, unspecified: Secondary | ICD-10-CM | POA: Diagnosis not present

## 2018-04-04 DIAGNOSIS — E782 Mixed hyperlipidemia: Secondary | ICD-10-CM | POA: Diagnosis not present

## 2018-04-04 DIAGNOSIS — R69 Illness, unspecified: Secondary | ICD-10-CM | POA: Diagnosis not present

## 2018-04-04 DIAGNOSIS — M1990 Unspecified osteoarthritis, unspecified site: Secondary | ICD-10-CM | POA: Diagnosis not present

## 2018-04-04 DIAGNOSIS — M169 Osteoarthritis of hip, unspecified: Secondary | ICD-10-CM | POA: Diagnosis not present

## 2018-04-04 DIAGNOSIS — J449 Chronic obstructive pulmonary disease, unspecified: Secondary | ICD-10-CM | POA: Diagnosis not present

## 2018-04-04 DIAGNOSIS — I10 Essential (primary) hypertension: Secondary | ICD-10-CM | POA: Diagnosis not present

## 2018-04-04 DIAGNOSIS — Z1389 Encounter for screening for other disorder: Secondary | ICD-10-CM | POA: Diagnosis not present

## 2018-04-04 DIAGNOSIS — Z23 Encounter for immunization: Secondary | ICD-10-CM | POA: Diagnosis not present

## 2018-04-07 ENCOUNTER — Other Ambulatory Visit: Payer: Self-pay | Admitting: *Deleted

## 2018-04-07 DIAGNOSIS — I712 Thoracic aortic aneurysm, without rupture, unspecified: Secondary | ICD-10-CM

## 2018-04-08 ENCOUNTER — Other Ambulatory Visit: Payer: Medicare HMO

## 2018-04-08 DIAGNOSIS — I712 Thoracic aortic aneurysm, without rupture, unspecified: Secondary | ICD-10-CM

## 2018-04-09 LAB — BASIC METABOLIC PANEL
BUN / CREAT RATIO: 21 (ref 12–28)
BUN: 20 mg/dL (ref 8–27)
CO2: 21 mmol/L (ref 20–29)
Calcium: 9.4 mg/dL (ref 8.7–10.3)
Chloride: 107 mmol/L — ABNORMAL HIGH (ref 96–106)
Creatinine, Ser: 0.97 mg/dL (ref 0.57–1.00)
GFR, EST AFRICAN AMERICAN: 64 mL/min/{1.73_m2} (ref >59)
GFR, EST NON AFRICAN AMERICAN: 56 mL/min/{1.73_m2} — AB (ref >59)
Glucose: 99 mg/dL (ref 65–99)
Potassium: 4.3 mmol/L (ref 3.5–5.2)
Sodium: 141 mmol/L (ref 134–144)

## 2018-04-10 ENCOUNTER — Ambulatory Visit (INDEPENDENT_AMBULATORY_CARE_PROVIDER_SITE_OTHER)
Admission: RE | Admit: 2018-04-10 | Discharge: 2018-04-10 | Disposition: A | Discharge status: Home / Self Care | Payer: Medicare HMO | Source: Ambulatory Visit | Attending: Interventional Cardiology | Admitting: Interventional Cardiology

## 2018-04-10 DIAGNOSIS — I712 Thoracic aortic aneurysm, without rupture, unspecified: Secondary | ICD-10-CM

## 2018-04-10 MED ORDER — IOPAMIDOL (ISOVUE-370) INJECTION 76%
100.0000 mL | Freq: Once | INTRAVENOUS | Status: AC | PRN
  Administered 2018-04-10: 100 mL via INTRAVENOUS

## 2018-04-13 NOTE — Progress Notes (Signed)
Cardiology Office Note:    Date:  04/14/2018   ID:  Denise Cline, DOB 1940/03/16, MRN [id number]  PCP:  Josetta Huddle, MD  Cardiologist:  No primary care provider on file.   Referring MD: Josetta Huddle, MD   Chief Complaint  Patient presents with  . Thoracic Aortic Aneurysm  . Atrial Fibrillation    History of Present Illness:    Denise Cline is a 78 y.o. female with a hx of CAD identified by screening CT scan due to the presence of calcification, ascending thoracic aneurysm, COPD, essential hypertension, PVD, severe hyperlipidemia with LDL target less than 70, and concomitant COPD.  Her son overdosed and died since I saw her in 11-10-2017.  She had a CT performed in 11-10-2017 that suggested in a sending aneurysm measuring 4.5 cm in diameter.  Repeat study in February 2020 demonstrates aneurysm measurement to be 4.2 cm.  She is asymptomatic.  She is on losartan 100 mg/day.  Metoprolol succinate which was being taken in 2022/11/11 has since been discontinued.  She does not know why the medication was stopped other than to say something was causing her to feel bad and "they stopped it".  She denies chest discomfort.  No orthopnea or PND.  No peripheral edema.   Past Medical History:  Diagnosis Date  . Anxiety   . COPD (chronic obstructive pulmonary disease) ([place])   . DVT (deep venous thrombosis) ([place]) 2014, 2017  . Fibroid   . History of shingles   . Hypertension   . PE (pulmonary thromboembolism) ([place]) 2017  . Thyroid disease    Nodules  . Varicose veins     Past Surgical History:  Procedure Laterality Date  . ABDOMINAL HYSTERECTOMY    . CHOLECYSTECTOMY N/A 11/05/2017   Procedure: LAPAROSCOPIC CHOLECYSTECTOMY WITH INTRAOPERATIVE CHOLANGIOGRAM;  Surgeon: Jovita Kussmaul, MD;  Location: [place];  Service: General;  Laterality: N/A;  . COLONOSCOPY N/A 10/05/2014   Procedure: COLONOSCOPY;  Surgeon: Ronald Lobo, MD;  Location: [facility];  Service: Endoscopy;   Laterality: N/A;  . ERCP N/A 11/04/2017   Procedure: ENDOSCOPIC RETROGRADE CHOLANGIOPANCREATOGRAPHY (ERCP) Balloon Extraction;  Surgeon: Ronnette Juniper, MD;  Location: [place];  Service: Gastroenterology;  Laterality: N/A;  . LAPAROSCOPIC CHOLECYSTECTOMY W/ CHOLANGIOGRAPHY  11/05/2017   Dr. Marlou Starks  . OOPHORECTOMY     One ovary removed  . SPHINCTEROTOMY  11/04/2017   Procedure: SPHINCTEROTOMY;  Surgeon: Ronnette Juniper, MD;  Location: [facility] ENDOSCOPY;  Service: Gastroenterology;;  . TOTAL THYROIDECTOMY      Current Medications: Current Meds  Medication Sig  . acetaminophen (TYLENOL) 650 MG CR tablet Take 650-1,300 mg by mouth 2 (two) times daily as needed for pain.  Marland Kitchen aspirin EC 81 MG tablet Take 81 mg by mouth See admin instructions. Take one tablet by mouth only on Mon, Wed, Friday.  Marland Kitchen buPROPion (WELLBUTRIN SR) 150 MG 12 hr tablet Take 150 mg by mouth 2 (two) times daily.   . calcium-vitamin D (OSCAL WITH D) 250-125 MG-UNIT tablet Take 2 tablets by mouth daily.  . ciprofloxacin (CIPRO) 250 MG tablet Take 1 tablet by mouth 2 (two) times daily.  . hydrochlorothiazide (HYDRODIURIL) 25 MG tablet Take 1 tablet by mouth daily.  Marland Kitchen losartan (COZAAR) 100 MG tablet Take 100 mg by mouth daily.  . Multiple Vitamins-Minerals (CENTRUM SILVER ADULT 50+) TABS Take 1 tablet by mouth daily.  . pantoprazole (PROTONIX) 40 MG tablet Take 1 tablet (40 mg total) by mouth daily.  . polyethylene  glycol (MIRALAX / GLYCOLAX) packet Take 17 g by mouth daily.  . rivaroxaban (XARELTO) 20 MG TABS tablet Take 20 mg by mouth daily with supper.  . rosuvastatin (CRESTOR) 20 MG tablet Take 1 tablet (20 mg total) by mouth daily.  . traMADol (ULTRAM) 50 MG tablet Take 1 tablet (50 mg total) by mouth every 6 (six) hours as needed for moderate pain.  Marland Kitchen triamcinolone (KENALOG) 0.025 % cream Apply 1 application topically daily as needed for itching.     Allergies:   Penicillins; Sulfa antibiotics; and Oxycodone   Social History    Socioeconomic History  . Marital status: Single    Spouse name: Not on file  . Number of children: 3  . Years of education: Not on file  . Highest education level: Not on file  Occupational History  . Not on file  Social Needs  . Financial resource strain: Not on file  . Food insecurity:    Worry: Not on file    Inability: Not on file  . Transportation needs:    Medical: Not on file    Non-medical: Not on file  Tobacco Use  . Smoking status: Former Smoker    Packs/day: 2.00    Years: 40.00    Pack years: 80.00    Types: Cigarettes    Last attempt to quit: 05/30/2008    Years since quitting: 9.8  . Smokeless tobacco: Former Systems developer    Quit date: 05/12/2008  . Tobacco comment: Counseled to remain smoke free  Substance and Sexual Activity  . Alcohol use: No    Alcohol/week: 0.0 standard drinks    Comment: Rare  . Drug use: No  . Sexual activity: Not Currently    Birth control/protection: Surgical  Lifestyle  . Physical activity:    Days per week: Not on file    Minutes per session: Not on file  . Stress: Not on file  Relationships  . Social connections:    Talks on phone: Not on file    Gets together: Not on file    Attends religious service: Not on file    Active member of club or organization: Not on file    Attends meetings of clubs or organizations: Not on file    Relationship status: Not on file  Other Topics Concern  . Not on file  Social History Narrative  . Not on file     Family History: The patient's family history includes Crohn's disease in her mother; Diabetes in her sister; Hypertension in her brother and sister; Stroke in her father.  ROS:   Please see the history of present illness.    Depression, muscle pain, easy bruising, cough, and leg swelling.  All other systems reviewed and are negative.  EKGs/Labs/Other Studies Reviewed:    The following studies were reviewed today:  CT angios of the chest and aorta February  2020:  IMPRESSION: Aneurysmal dilatation in the ascending aorta is 4.3 cm compared with 4.5 cm on the prior study. This is not significantly changed. No evidence of aortic dissection. Recommend annual imaging followup by CTA or MRA. This recommendation follows 2010 ACCF/AHA/AATS/ACR/ASA/SCA/SCAI/SIR/STS/SVM Guidelines for the Diagnosis and Management of Patients with Thoracic Aortic Disease. Circulation. 2010; 121: P710-G269. Aortic aneurysm NOS (ICD10-I71.9)  EKG:  EKG not repeated  Recent Labs: 11/05/2017: TSH 0.403 11/06/2017: Magnesium 1.7 11/07/2017: Hemoglobin 10.5; Platelets 299 12/25/2017: ALT 14 04/08/2018: BUN 20; Creatinine, Ser 0.97; Potassium 4.3; Sodium 141  Recent Lipid Panel    Component Value  Date/Time   CHOL 204 (H) 12/25/2017 0816   TRIG 96 12/25/2017 0816   HDL 60 12/25/2017 0816   CHOLHDL 3.4 12/25/2017 0816   CHOLHDL 3.7 05/15/2009 0555   VLDL 22 05/15/2009 0555   LDLCALC 125 (H) 12/25/2017 0816    Physical Exam:    VS:  BP 116/82   Pulse 99   Ht 5\' 6"  (1.676 m)   Wt 201 lb 6.4 oz (91.4 kg)   SpO2 98%   BMI 32.51 kg/m     Wt Readings from Last 3 Encounters:  04/14/18 201 lb 6.4 oz (91.4 kg)  12/11/17 195 lb (88.5 kg)  11/04/17 199 lb 11.8 oz (90.6 kg)     GEN: Younger than stated age. No acute distress HEENT: Normal NECK: No JVD. LYMPHATICS: No lymphadenopathy CARDIAC: RRR.  No murmur, no gallop, no edema VASCULAR: 2+ bilateral radial and carotid pulses, no bruits RESPIRATORY:  Clear to auscultation without rales, wheezing or rhonchi  ABDOMEN: Soft, non-tender, non-distended, No pulsatile mass, MUSCULOSKELETAL: No deformity  SKIN: Warm and dry NEUROLOGIC:  Alert and oriented x 3 PSYCHIATRIC:  Normal affect   ASSESSMENT:    1. Hyperlipidemia with target LDL less than 100   2. Coronary artery calcification seen on CAT scan   3. Thoracic aortic aneurysm without rupture ([place])   4. Essential hypertension   5. DOE (dyspnea on exertion)     PLAN:    In order of problems listed above:  1. LDL is 140.  Given her vascular issues, this needs to be treated.  She has noted some coronary calcification.  Currently on Crestor 20 mg/day.  We should consider increasing to 40 mg/day. 2. Secondary risk prevention 3. Already on losartan 100 mg/day.  Resume metoprolol succinate 12.5 mg/day.  These medications will slow progression of aortic dilatation. 4. Blood pressure is 120/80 mmHg.  Hence low-dose metoprolol is most appropriate.  Clinical follow-up in 1 year for vascular follow-up.  Aneurysm is stable.  Call if medication side effects.   Medication Adjustments/Labs and Tests Ordered: Current medicines are reviewed at length with the patient today.  Concerns regarding medicines are outlined above.  No orders of the defined types were placed in this encounter.  No orders of the defined types were placed in this encounter.   There are no Patient Instructions on file for this visit.   Signed, Sinclair Grooms, MD  04/14/2018 1:42 PM    [facility]

## 2018-04-14 ENCOUNTER — Encounter: Payer: Self-pay | Admitting: Interventional Cardiology

## 2018-04-14 ENCOUNTER — Ambulatory Visit: Payer: Medicare HMO | Admitting: Interventional Cardiology

## 2018-04-14 VITALS — BP 116/82 | HR 99 | Ht 66.0 in | Wt 201.4 lb

## 2018-04-14 DIAGNOSIS — I712 Thoracic aortic aneurysm, without rupture, unspecified: Secondary | ICD-10-CM

## 2018-04-14 DIAGNOSIS — E785 Hyperlipidemia, unspecified: Secondary | ICD-10-CM

## 2018-04-14 DIAGNOSIS — I10 Essential (primary) hypertension: Secondary | ICD-10-CM

## 2018-04-14 DIAGNOSIS — R0600 Dyspnea, unspecified: Secondary | ICD-10-CM

## 2018-04-14 DIAGNOSIS — I2510 Atherosclerotic heart disease of native coronary artery without angina pectoris: Secondary | ICD-10-CM | POA: Diagnosis not present

## 2018-04-14 DIAGNOSIS — R0609 Other forms of dyspnea: Secondary | ICD-10-CM

## 2018-04-14 MED ORDER — METOPROLOL SUCCINATE ER 25 MG PO TB24: 12.5000 mg | ORAL_TABLET | Freq: Every day | ORAL | 3 refills | Status: DC

## 2018-04-14 NOTE — Patient Instructions (Signed)
Medication Instructions:  1) START TOPROL 12.5 mg daily  Labwork: None  Testing/Procedures: None  Follow-Up: Your provider wants you to follow-up in: 9-12 months with Dr. Tamala Julian. You will receive a reminder letter in the mail two months in advance. If you don't receive a letter, please call our office to schedule the follow-up appointment.

## 2018-07-29 ENCOUNTER — Encounter (HOSPITAL_COMMUNITY): Payer: Self-pay

## 2018-07-29 ENCOUNTER — Emergency Department (HOSPITAL_COMMUNITY): Payer: Medicare HMO

## 2018-07-29 ENCOUNTER — Other Ambulatory Visit: Payer: Self-pay

## 2018-07-29 ENCOUNTER — Emergency Department (HOSPITAL_COMMUNITY)
Admission: EM | Admit: 2018-07-29 | Discharge: 2018-07-30 | Disposition: A | Discharge status: Home / Self Care | Payer: Medicare HMO | Attending: Emergency Medicine | Admitting: Emergency Medicine

## 2018-07-29 DIAGNOSIS — H53133 Sudden visual loss, bilateral: Secondary | ICD-10-CM | POA: Diagnosis not present

## 2018-07-29 DIAGNOSIS — H25013 Cortical age-related cataract, bilateral: Secondary | ICD-10-CM | POA: Diagnosis not present

## 2018-07-29 DIAGNOSIS — N183 Chronic kidney disease, stage 3 (moderate): Secondary | ICD-10-CM | POA: Diagnosis not present

## 2018-07-29 DIAGNOSIS — Z79899 Other long term (current) drug therapy: Secondary | ICD-10-CM | POA: Insufficient documentation

## 2018-07-29 DIAGNOSIS — R0602 Shortness of breath: Secondary | ICD-10-CM | POA: Diagnosis not present

## 2018-07-29 DIAGNOSIS — J449 Chronic obstructive pulmonary disease, unspecified: Secondary | ICD-10-CM | POA: Insufficient documentation

## 2018-07-29 DIAGNOSIS — I129 Hypertensive chronic kidney disease with stage 1 through stage 4 chronic kidney disease, or unspecified chronic kidney disease: Secondary | ICD-10-CM | POA: Insufficient documentation

## 2018-07-29 DIAGNOSIS — R51 Headache: Secondary | ICD-10-CM | POA: Diagnosis not present

## 2018-07-29 DIAGNOSIS — H547 Unspecified visual loss: Secondary | ICD-10-CM

## 2018-07-29 DIAGNOSIS — G459 Transient cerebral ischemic attack, unspecified: Secondary | ICD-10-CM | POA: Diagnosis not present

## 2018-07-29 DIAGNOSIS — I6782 Cerebral ischemia: Secondary | ICD-10-CM | POA: Diagnosis not present

## 2018-07-29 DIAGNOSIS — H53129 Transient visual loss, unspecified eye: Secondary | ICD-10-CM | POA: Diagnosis not present

## 2018-07-29 DIAGNOSIS — Z7982 Long term (current) use of aspirin: Secondary | ICD-10-CM | POA: Insufficient documentation

## 2018-07-29 DIAGNOSIS — R600 Localized edema: Secondary | ICD-10-CM | POA: Insufficient documentation

## 2018-07-29 DIAGNOSIS — Z87891 Personal history of nicotine dependence: Secondary | ICD-10-CM | POA: Diagnosis not present

## 2018-07-29 DIAGNOSIS — I10 Essential (primary) hypertension: Secondary | ICD-10-CM | POA: Diagnosis not present

## 2018-07-29 DIAGNOSIS — N3000 Acute cystitis without hematuria: Secondary | ICD-10-CM

## 2018-07-29 DIAGNOSIS — H25043 Posterior subcapsular polar age-related cataract, bilateral: Secondary | ICD-10-CM | POA: Diagnosis not present

## 2018-07-29 DIAGNOSIS — H2513 Age-related nuclear cataract, bilateral: Secondary | ICD-10-CM | POA: Diagnosis not present

## 2018-07-29 LAB — URINALYSIS, ROUTINE W REFLEX MICROSCOPIC
Bilirubin Urine: NEGATIVE
Glucose, UA: NEGATIVE mg/dL
Hgb urine dipstick: NEGATIVE
Ketones, ur: NEGATIVE mg/dL
Nitrite: POSITIVE — AB
Protein, ur: NEGATIVE mg/dL
Specific Gravity, Urine: 1.018 (ref 1.005–1.030)
pH: 5 (ref 5.0–8.0)

## 2018-07-29 LAB — PROTIME-INR
INR: 1 (ref 0.8–1.2)
Prothrombin Time: 13.2 seconds (ref 11.4–15.2)

## 2018-07-29 LAB — DIFFERENTIAL
Abs Immature Granulocytes: 0.01 10*3/uL (ref 0.00–0.07)
Basophils Absolute: 0 10*3/uL (ref 0.0–0.1)
Basophils Relative: 1 %
Eosinophils Absolute: 0.2 10*3/uL (ref 0.0–0.5)
Eosinophils Relative: 3 %
Immature Granulocytes: 0 %
Lymphocytes Relative: 36 %
Lymphs Abs: 2.1 10*3/uL (ref 0.7–4.0)
Monocytes Absolute: 0.4 10*3/uL (ref 0.1–1.0)
Monocytes Relative: 7 %
Neutro Abs: 3.1 10*3/uL (ref 1.7–7.7)
Neutrophils Relative %: 53 %

## 2018-07-29 LAB — CBG MONITORING, ED
Glucose-Capillary: 52 mg/dL — ABNORMAL LOW (ref 70–99)
Glucose-Capillary: 62 mg/dL — ABNORMAL LOW (ref 70–99)
Glucose-Capillary: 72 mg/dL (ref 70–99)

## 2018-07-29 LAB — CBC
HCT: 39.6 % (ref 36.0–46.0)
Hemoglobin: 12.6 g/dL (ref 12.0–15.0)
MCH: 31.4 pg (ref 26.0–34.0)
MCHC: 31.8 g/dL (ref 30.0–36.0)
MCV: 98.8 fL (ref 80.0–100.0)
Platelets: 254 10*3/uL (ref 150–400)
RBC: 4.01 MIL/uL (ref 3.87–5.11)
RDW: 14.5 % (ref 11.5–15.5)
WBC: 5.9 10*3/uL (ref 4.0–10.5)
nRBC: 0 % (ref 0.0–0.2)

## 2018-07-29 LAB — COMPREHENSIVE METABOLIC PANEL
ALT: 16 U/L (ref 0–44)
AST: 19 U/L (ref 15–41)
Albumin: 3.8 g/dL (ref 3.5–5.0)
Alkaline Phosphatase: 44 U/L (ref 38–126)
Anion gap: 10 (ref 5–15)
BUN: 22 mg/dL (ref 8–23)
CO2: 27 mmol/L (ref 22–32)
Calcium: 9.7 mg/dL (ref 8.9–10.3)
Chloride: 104 mmol/L (ref 98–111)
Creatinine, Ser: 1.06 mg/dL — ABNORMAL HIGH (ref 0.44–1.00)
GFR calc Af Amer: 57 mL/min — ABNORMAL LOW (ref >60)
GFR calc non Af Amer: 50 mL/min — ABNORMAL LOW (ref >60)
Glucose, Bld: 97 mg/dL (ref 70–99)
Potassium: 4.1 mmol/L (ref 3.5–5.1)
Sodium: 141 mmol/L (ref 135–145)
Total Bilirubin: 0.8 mg/dL (ref 0.3–1.2)
Total Protein: 7.2 g/dL (ref 6.5–8.1)

## 2018-07-29 LAB — APTT: aPTT: 32 seconds (ref 24–36)

## 2018-07-29 MED ORDER — SODIUM CHLORIDE 0.9% FLUSH: 3.0000 mL | Freq: Once | INTRAVENOUS | Status: DC

## 2018-07-29 NOTE — ED Notes (Signed)
Pt sugar rechecked and it was 62. [place] sandwich and orange juice given. Will recheck in 20 min

## 2018-07-29 NOTE — ED Triage Notes (Signed)
Pt presents as referral from opthalmology for eval of episodic loss of vision. Pt reports this happened once before, and then yesterday last approx 20 minutes. Followed up with optho today, sent here for r/o TIA. Pt is currently symptom free, vision intact. Reports minor pressure behind eyes, but states no other complaints. Pt also reports ?DVT to LLE, currently anticoagulated on xarelto. Neuro intact in triage, no obvious deficits, no droop/drift/aphasia, denies vision changes on arrival

## 2018-07-29 NOTE — ED Notes (Signed)
Pt's glucose increased to 72. Will continue to monitor

## 2018-07-29 NOTE — ED Notes (Signed)
Pt's glucose 52, given OJ by tech and will recheck in 10 min

## 2018-07-29 NOTE — ED Notes (Signed)
Pt given orange juice for blood sugar of 54. Will recheck in 15 min

## 2018-07-29 NOTE — ED Provider Notes (Signed)
[facility] EMERGENCY DEPARTMENT Provider Note   CSN: [id number] Arrival date & time: 07/29/18  1710    History   Chief Complaint Chief Complaint  Patient presents with  . Loss of Vision    HPI Denise Cline is a 78 y.o. female past medical history of anxiety, COPD, DVT/PE on Xarelto, HTN who presents after an episode of transient bilateral vision loss.  Patient states yesterday she had an episode where her vision went blurry bilaterally and then she lost complete vision in both eyes.  This lasted about 2 minutes and then she developed blurry vision again followed by resolution of vision loss.  States she had an episode similar a week ago, however at that time did not have full vision loss.  Also endorses a similar episode 1 year ago.  Denies any symptoms of lightheadedness at the time, feeling like was going to pass out.  No nausea, vomiting, chest pain, or abdominal pain.  Endorses some intermittent shortness of breath that is unchanged from baseline.  Also endorses that her left leg is more swollen than her right leg but states that this is also baseline and has been that way since her DVT.  Was at her ophthalmologist today who sent her here for concern for possible TIA.     HPI  Past Medical History:  Diagnosis Date  . Anxiety   . COPD (chronic obstructive pulmonary disease) ([place])   . DVT (deep venous thrombosis) ([place]) 2014, 2017  . Fibroid   . History of shingles   . Hypertension   . PE (pulmonary thromboembolism) ([place]) 2017  . Thyroid disease    Nodules  . Varicose veins     Patient Active Problem List   Diagnosis Date Noted  . Choledocholithiasis with acute cholecystitis with obstruction 11/02/2017  . Chronic kidney disease, stage 3 ([place]) 11/02/2017  . DVT (deep venous thrombosis) ([place]) 11/12/2015  . Pulmonary emboli ([place]) 11/11/2015  . Acute pulmonary embolism ([place]) 11/11/2015  . Coronary artery calcification seen on CAT scan 05/12/2015  .  Thoracic aortic aneurysm ([place]) 05/12/2015  . Dyspnea 03/17/2015  . Left leg swelling 03/16/2015  . Acute colitis 10/04/2014  . COPD (chronic obstructive pulmonary disease) ([place])   . PVD (peripheral vascular disease) ([place]) 04/01/2012  . Leg pain 04/01/2012  . Swelling of limb 04/01/2012  . History of shingles   . Hypertension   . Fibroid   . Thyroid disease   . Anxiety     Past Surgical History:  Procedure Laterality Date  . ABDOMINAL HYSTERECTOMY    . CHOLECYSTECTOMY N/A 11/05/2017   Procedure: LAPAROSCOPIC CHOLECYSTECTOMY WITH INTRAOPERATIVE CHOLANGIOGRAM;  Surgeon: Jovita Kussmaul, MD;  Location: [facility];  Service: General;  Laterality: N/A;  . COLONOSCOPY N/A 10/05/2014   Procedure: COLONOSCOPY;  Surgeon: Ronald Lobo, MD;  Location: [facility];  Service: Endoscopy;  Laterality: N/A;  . ERCP N/A 11/04/2017   Procedure: ENDOSCOPIC RETROGRADE CHOLANGIOPANCREATOGRAPHY (ERCP) Balloon Extraction;  Surgeon: Ronnette Juniper, MD;  Location: [facility];  Service: Gastroenterology;  Laterality: N/A;  . LAPAROSCOPIC CHOLECYSTECTOMY W/ CHOLANGIOGRAPHY  11/05/2017   Dr. Marlou Starks  . OOPHORECTOMY     One ovary removed  . SPHINCTEROTOMY  11/04/2017   Procedure: SPHINCTEROTOMY;  Surgeon: Ronnette Juniper, MD;  Location: [facility] ENDOSCOPY;  Service: Gastroenterology;;  . TOTAL THYROIDECTOMY       OB History    Gravida  3   Para  3   Term  3   Preterm  AB      Living  3     SAB      TAB      Ectopic      Multiple      Live Births               Home Medications    Prior to Admission medications   Medication Sig Start Date End Date Taking? Authorizing Provider  acetaminophen (TYLENOL) 650 MG CR tablet Take 1,300 mg by mouth 2 (two) times a day.    Yes [provider]  aspirin EC 81 MG tablet Take 81 mg by mouth every Monday, Wednesday, and Friday.    Yes [provider]  buPROPion (WELLBUTRIN SR) 150 MG 12 hr tablet Take 150 mg by mouth 2 (two) times daily.    Yes  [provider]  calcium-vitamin D (OSCAL WITH D) 250-125 MG-UNIT tablet Take 2 tablets by mouth daily.   Yes [provider]  hydrochlorothiazide (HYDRODIURIL) 25 MG tablet Take 25 mg by mouth daily.  02/05/18  Yes [provider]  losartan (COZAAR) 100 MG tablet Take 100 mg by mouth daily. 02/05/18  Yes [provider]  metoprolol succinate (TOPROL-XL) 25 MG 24 hr tablet Take 0.5 tablets (12.5 mg total) by mouth daily. 04/14/18 04/09/19 Yes Belva Crome, MD  Multiple Vitamins-Minerals (CENTRUM SILVER ADULT 50+) TABS Take 1 tablet by mouth daily.   Yes [provider]  pantoprazole (PROTONIX) 40 MG tablet Take 1 tablet (40 mg total) by mouth daily. 10/21/17  Yes Belva Crome, MD  polyethylene glycol [facility] / Floria Raveling) packet Take 17 g by mouth daily. Patient taking differently: Take 17 g by mouth 2 (two) times a week.  11/07/17  Yes Rayburn, Floyce Stakes, PA-C  rivaroxaban (XARELTO) 20 MG TABS tablet Take 20 mg by mouth daily with supper.   Yes [provider]  rosuvastatin (CRESTOR) 20 MG tablet Take 1 tablet (20 mg total) by mouth daily. 10/25/17 10/25/18 Yes Belva Crome, MD  triamcinolone (KENALOG) 0.025 % cream Apply 1 application topically daily as needed for itching. 03/29/17  Yes [provider]  traMADol (ULTRAM) 50 MG tablet Take 1 tablet (50 mg total) by mouth every 6 (six) hours as needed for moderate pain. Patient not taking: Reported on 07/29/2018 08/23/15   Mesner, Corene Cornea, MD    Family History Family History  Problem Relation Age of Onset  . Crohn's disease Mother   . Stroke Father   . Diabetes Sister   . Hypertension Sister   . Hypertension Brother     Social History Social History   Tobacco Use  . Smoking status: Former Smoker    Packs/day: 2.00    Years: 40.00    Pack years: 80.00    Types: Cigarettes    Last attempt to quit: 05/30/2008    Years since quitting: 10.1  . Smokeless tobacco: Former Systems developer    Quit  date: 05/12/2008  . Tobacco comment: Counseled to remain smoke free  Substance Use Topics  . Alcohol use: No    Alcohol/week: 0.0 standard drinks    Comment: Rare  . Drug use: No     Allergies   Penicillins; Sulfa antibiotics; Citrus; Oxycodone; and Vicodin [hydrocodone-acetaminophen]   Review of Systems Review of Systems  Constitutional: Positive for fatigue (over the past week). Negative for activity change, chills and fever.  HENT: Negative for congestion, ear pain and sore throat.   Eyes: Negative for pain and  visual disturbance.  Respiratory: Positive for shortness of breath (intermittent. unchanged from baseline). Negative for cough and chest tightness.   Cardiovascular: Positive for leg swelling (left worse than right. states this has been that way since her DVT). Negative for chest pain and palpitations.  Gastrointestinal: Negative for abdominal pain, diarrhea, nausea and vomiting.  Genitourinary: Negative for dysuria.  Musculoskeletal: Negative for arthralgias, back pain and neck pain.  Skin: Negative for color change and rash.  Neurological: Positive for headaches ("pressure behind my eyes"). Negative for seizures and syncope.  All other systems reviewed and are negative.    Physical Exam Updated Vital Signs BP (!) 159/92   Pulse (!) 57   Temp 98.3 F (36.8 C) (Oral)   Resp 16   Ht 5\' 9"  (1.753 m)   Wt 91 kg   SpO2 100%   BMI 29.63 kg/m   Physical Exam Vitals signs and nursing note reviewed.  Constitutional:      General: She is not in acute distress.    Appearance: Normal appearance. She is well-developed. She is not ill-appearing.  HENT:     Head: Normocephalic and atraumatic.     Nose: Nose normal.  Eyes:     Extraocular Movements: Extraocular movements intact.     Conjunctiva/sclera: Conjunctivae normal.     Pupils: Pupils are equal, round, and reactive to light.  Neck:     Musculoskeletal: Neck supple. No muscular tenderness.  Cardiovascular:      Rate and Rhythm: Normal rate and regular rhythm.     Heart sounds: No murmur.     Comments: intermittently bradycardic Pulmonary:     Effort: Pulmonary effort is normal. No respiratory distress.     Breath sounds: Normal breath sounds. No wheezing, rhonchi or rales.  Chest:     Chest wall: No tenderness.  Abdominal:     Palpations: Abdomen is soft.     Tenderness: There is no abdominal tenderness.  Musculoskeletal:     Right lower leg: Edema present.     Left lower leg: Edema present.     Comments: Left leg appears mildly larger than the right. History of DVT in this extremity on xeralto  Skin:    General: Skin is warm and dry.  Neurological:     General: No focal deficit present.     Mental Status: She is alert and oriented to person, place, and time.     Cranial Nerves: No cranial nerve deficit.     Sensory: No sensory deficit.     Motor: No weakness.     Coordination: Coordination normal.     Gait: Gait normal.      ED Treatments / Results  Labs (all labs ordered are listed, but only abnormal results are displayed) Labs Reviewed  COMPREHENSIVE METABOLIC PANEL - Abnormal; Notable for the following components:      Result Value   Creatinine, Ser 1.06 (*)    GFR calc non Af Amer 50 (*)    GFR calc Af Amer 57 (*)    All other components within normal limits  CBG MONITORING, ED - Abnormal; Notable for the following components:   Glucose-Capillary 52 (*)    All other components within normal limits  CBG MONITORING, ED - Abnormal; Notable for the following components:   Glucose-Capillary 62 (*)    All other components within normal limits  PROTIME-INR  APTT  CBC  DIFFERENTIAL  CBG MONITORING, ED    EKG None  Radiology Ct Head Wo Contrast  Result Date: 07/29/2018 CLINICAL DATA:  Episodic vision loss. EXAM: CT HEAD WITHOUT CONTRAST TECHNIQUE: Contiguous axial images were obtained from the base of the skull through the vertex without intravenous contrast.  COMPARISON:  Brain MRI 04/03/2017.  Head CT 11/11/2015. FINDINGS: Brain: No evidence of acute infarction, hemorrhage, hydrocephalus, extra-axial collection or mass lesion/mass effect. Mild atrophy and chronic microvascular ischemic change noted. Vascular: No hyperdense vessel or unexpected calcification. Skull: Intact.  No focal lesion. Sinuses/Orbits: Negative. Other: None. IMPRESSION: No acute abnormality. Mild atrophy and chronic microvascular ischemic change. Electronically Signed   By: Inge Rise M.D.   On: 07/29/2018 18:03    Procedures Procedures (including critical care time)  Medications Ordered in ED Medications  sodium chloride flush (NS) 0.9 % injection 3 mL (has no administration in time range)     Initial Impression / Assessment and Plan / ED Course  I have reviewed the triage vital signs and the nursing notes.  Pertinent labs & imaging results that were available during my care of the patient were reviewed by me and considered in my medical decision making (see chart for details).        Denise Cline is a 78 y.o. female past medical history of anxiety, COPD, DVT/PE on Xarelto, HTN who presents after an episode of transient bilateral vision loss.  She presents to the ED after ophthalmologist recommended getting a work-up for TIA.  Episode of transient bilateral vision loss lasted approximately 2 minutes.  Denies any other symptoms during this time.  Currently is back to baseline with no neurological deficits.  On initial exam patient well appearing, not in acute distress. VSS. Physical exam as above nonfocal, normal neuro exam.   Patient was seen by her ophthalmologist today with a normal eye exam.   CT head negative for acute intracranial pathology.  Labs unremarkable with no emergent findings.  Initial glucose was 97.  However patient became hypoglycemic 52.  Patient given orange juice with improvement.  Called and spoke with neurology who recommended MRI  with and without brain and MRA head and neck.  They do not feel that history is consistent with TIA and I agree as this was bilateral vision loss.   On reassessment patient has remained asymptomatic.  Patient CARE handed off to oncoming provider at 1130.  At this time she is pending imaging.  Final Clinical Impressions(s) / ED Diagnoses   Final diagnoses:  Visual loss    ED Discharge Orders    None       Doneta Public, MD 07/29/18 1610    Lacretia Leigh, MD 08/01/18 [phone]

## 2018-07-29 NOTE — ED Provider Notes (Signed)
I saw and evaluated the patient, reviewed the resident's note and I agree with the findings and plan.  EKG: None  78 year old female who comes here after being seen by ophthalmologist due to transient loss of vision lasting several minutes yesterday.  Patient states that patient went out in both eyes and she cannot even see light.  Office notes were reviewed and she had a thorough eye exam including visual mapping.  Patient likely to require MRI and will consult neurology   Lacretia Leigh, MD 07/29/18 2231

## 2018-07-30 ENCOUNTER — Emergency Department (HOSPITAL_COMMUNITY): Payer: Medicare HMO

## 2018-07-30 DIAGNOSIS — R51 Headache: Secondary | ICD-10-CM | POA: Diagnosis not present

## 2018-07-30 DIAGNOSIS — J449 Chronic obstructive pulmonary disease, unspecified: Secondary | ICD-10-CM | POA: Diagnosis not present

## 2018-07-30 DIAGNOSIS — R0602 Shortness of breath: Secondary | ICD-10-CM | POA: Diagnosis not present

## 2018-07-30 DIAGNOSIS — Z7982 Long term (current) use of aspirin: Secondary | ICD-10-CM | POA: Diagnosis not present

## 2018-07-30 DIAGNOSIS — Z79899 Other long term (current) drug therapy: Secondary | ICD-10-CM | POA: Diagnosis not present

## 2018-07-30 DIAGNOSIS — G459 Transient cerebral ischemic attack, unspecified: Secondary | ICD-10-CM | POA: Diagnosis not present

## 2018-07-30 DIAGNOSIS — N183 Chronic kidney disease, stage 3 (moderate): Secondary | ICD-10-CM | POA: Diagnosis not present

## 2018-07-30 DIAGNOSIS — I129 Hypertensive chronic kidney disease with stage 1 through stage 4 chronic kidney disease, or unspecified chronic kidney disease: Secondary | ICD-10-CM | POA: Diagnosis not present

## 2018-07-30 DIAGNOSIS — H547 Unspecified visual loss: Secondary | ICD-10-CM | POA: Diagnosis not present

## 2018-07-30 DIAGNOSIS — N3000 Acute cystitis without hematuria: Secondary | ICD-10-CM | POA: Diagnosis not present

## 2018-07-30 DIAGNOSIS — R600 Localized edema: Secondary | ICD-10-CM | POA: Diagnosis not present

## 2018-07-30 MED ORDER — NITROFURANTOIN MONOHYD MACRO 100 MG PO CAPS: 100.0000 mg | ORAL_CAPSULE | Freq: Two times a day (BID) | ORAL | 0 refills | Status: DC

## 2018-07-30 MED ORDER — GADOBUTROL 1 MMOL/ML IV SOLN
9.9000 mL | Freq: Once | INTRAVENOUS | Status: AC | PRN
  Administered 2018-07-30: 02:00:00 9.9 mL via INTRAVENOUS

## 2018-07-30 MED ORDER — NITROFURANTOIN MONOHYD MACRO 100 MG PO CAPS
100.0000 mg | ORAL_CAPSULE | Freq: Once | ORAL | Status: AC
  Administered 2018-07-30: 100 mg via ORAL
  Filled 2018-07-30: qty 1

## 2018-07-30 NOTE — Discharge Instructions (Signed)
Your work-up in the emergency department was reassuring.  Your MRI did not suggest any evidence of a stroke.  We recommend follow-up with your primary care doctor.  As an aside, you were found to have a urinary tract infection.  Take Macrobid as prescribed until finished.  You should have your urine rechecked by your primary care doctor to ensure that this infection has cleared.  Return to the ED for new or concerning symptoms.

## 2018-07-30 NOTE — ED Notes (Signed)
Pt stated she would call her son herself to give him updates

## 2018-07-30 NOTE — ED Notes (Signed)
Patient transported to MRI 

## 2018-07-30 NOTE — ED Notes (Signed)
Pt still in MRI 

## 2018-07-30 NOTE — ED Provider Notes (Signed)
3:26 AM Patient care assumed at shift change from Dr. Marijean Bravo.  In short, patient presenting to the emergency department for evaluation of transient bilateral vision loss.  Upon my discussion with the patient, she states that she has been experiencing these episodes for at least a year but "they do not last long".  Neurology was consulted who advised MRI and MRA.  Neurology felt the patient was appropriate for discharge if imaging did not show any evidence of stroke.  Patient's imaging in the ED tonight is without acute findings.  Upon reassessment of the patient, she continues to be asymptomatic.  Vital signs have been stable.  Given symptom chronicity and reassuring work-up, will refer to primary care for follow up.  Return precautions discussed and provided. Patient discharged in stable condition with no unaddressed concerns.    Results for orders placed or performed during the hospital encounter of 07/29/18  Protime-INR  Result Value Ref Range   Prothrombin Time 13.2 11.4 - 15.2 seconds   INR 1.0 0.8 - 1.2  APTT  Result Value Ref Range   aPTT 32 24 - 36 seconds  CBC  Result Value Ref Range   WBC 5.9 4.0 - 10.5 K/uL   RBC 4.01 3.87 - 5.11 MIL/uL   Hemoglobin 12.6 12.0 - 15.0 g/dL   HCT 39.6 36.0 - 46.0 %   MCV 98.8 80.0 - 100.0 fL   MCH 31.4 26.0 - 34.0 pg   MCHC 31.8 30.0 - 36.0 g/dL   RDW 14.5 11.5 - 15.5 %   Platelets 254 150 - 400 K/uL   nRBC 0.0 0.0 - 0.2 %  Differential  Result Value Ref Range   Neutrophils Relative % 53 %   Neutro Abs 3.1 1.7 - 7.7 K/uL   Lymphocytes Relative 36 %   Lymphs Abs 2.1 0.7 - 4.0 K/uL   Monocytes Relative 7 %   Monocytes Absolute 0.4 0.1 - 1.0 K/uL   Eosinophils Relative 3 %   Eosinophils Absolute 0.2 0.0 - 0.5 K/uL   Basophils Relative 1 %   Basophils Absolute 0.0 0.0 - 0.1 K/uL   Immature Granulocytes 0 %   Abs Immature Granulocytes 0.01 0.00 - 0.07 K/uL  Comprehensive metabolic panel  Result Value Ref Range   Sodium 141 135 - 145 mmol/L    Potassium 4.1 3.5 - 5.1 mmol/L   Chloride 104 98 - 111 mmol/L   CO2 27 22 - 32 mmol/L   Glucose, Bld 97 70 - 99 mg/dL   BUN 22 8 - 23 mg/dL   Creatinine, Ser 1.06 (H) 0.44 - 1.00 mg/dL   Calcium 9.7 8.9 - 10.3 mg/dL   Total Protein 7.2 6.5 - 8.1 g/dL   Albumin 3.8 3.5 - 5.0 g/dL   AST 19 15 - 41 U/L   ALT 16 0 - 44 U/L   Alkaline Phosphatase 44 38 - 126 U/L   Total Bilirubin 0.8 0.3 - 1.2 mg/dL   GFR calc non Af Amer 50 (L) >60 mL/min   GFR calc Af Amer 57 (L) >60 mL/min   Anion gap 10 5 - 15  Urinalysis, Routine w reflex microscopic  Result Value Ref Range   Color, Urine YELLOW YELLOW   APPearance HAZY (A) CLEAR   Specific Gravity, Urine 1.018 1.005 - 1.030   pH 5.0 5.0 - 8.0   Glucose, UA NEGATIVE NEGATIVE mg/dL   Hgb urine dipstick NEGATIVE NEGATIVE   Bilirubin Urine NEGATIVE NEGATIVE   Ketones, ur NEGATIVE NEGATIVE  mg/dL   Protein, ur NEGATIVE NEGATIVE mg/dL   Nitrite POSITIVE (A) NEGATIVE   Leukocytes,Ua SMALL (A) NEGATIVE   RBC / HPF 0-5 0 - 5 RBC/hpf   WBC, UA 6-10 0 - 5 WBC/hpf   Bacteria, UA MANY (A) NONE SEEN   Squamous Epithelial / LPF 0-5 0 - 5  CBG monitoring, ED  Result Value Ref Range   Glucose-Capillary 52 (L) 70 - 99 mg/dL   Comment 1 Notify RN    Comment 2 Document in Chart   CBG monitoring, ED  Result Value Ref Range   Glucose-Capillary 62 (L) 70 - 99 mg/dL   Comment 1 Notify RN    Comment 2 Document in Chart   CBG monitoring, ED  Result Value Ref Range   Glucose-Capillary 72 70 - 99 mg/dL   Ct Head Wo Contrast  Result Date: 07/29/2018 CLINICAL DATA:  Episodic vision loss. EXAM: CT HEAD WITHOUT CONTRAST TECHNIQUE: Contiguous axial images were obtained from the base of the skull through the vertex without intravenous contrast. COMPARISON:  Brain MRI 04/03/2017.  Head CT 11/11/2015. FINDINGS: Brain: No evidence of acute infarction, hemorrhage, hydrocephalus, extra-axial collection or mass lesion/mass effect. Mild atrophy and chronic microvascular  ischemic change noted. Vascular: No hyperdense vessel or unexpected calcification. Skull: Intact.  No focal lesion. Sinuses/Orbits: Negative. Other: None. IMPRESSION: No acute abnormality. Mild atrophy and chronic microvascular ischemic change. Electronically Signed   By: Inge Rise M.D.   On: 07/29/2018 18:03   Mr Jodene Nam Head Wo Contrast  Result Date: 07/30/2018 CLINICAL DATA:  Initial evaluation for trans and bilateral visual loss, TIA. EXAM: MRI HEAD WITHOUT AND WITH CONTRAST MRA HEAD WITHOUT CONTRAST MRA NECK WITHOUT AND WITH CONTRAST TECHNIQUE: Multiplanar, multiecho pulse sequences of the brain and surrounding structures were obtained without intravenous contrast. Angiographic images of the Circle of Willis were obtained using MRA technique without intravenous contrast. Angiographic images of the neck were obtained using MRA technique without and with intravenous contrast. Carotid stenosis measurements (when applicable) are obtained utilizing NASCET criteria, using the distal internal carotid diameter as the denominator. CONTRAST:  10 cc of Gadavist. COMPARISON:  Comparison made with prior CT from 07/29/2018. FINDINGS: MRI HEAD FINDINGS Brain: Diffuse prominence of the CSF containing spaces compatible with generalized age-related cerebral atrophy. Patchy and confluent T2/FLAIR hyperintensity within the periventricular deep white matter both cerebral hemispheres most like related chronic microvascular ischemic disease, mild for age. No abnormal foci of restricted diffusion to suggest acute or subacute ischemia. Gray-white matter differentiation well maintained. No encephalomalacia to suggest chronic infarction. No foci of susceptibility artifact to suggest acute or chronic intracranial hemorrhage. No mass lesion, midline shift or mass effect. No hydrocephalus. No extra-axial fluid collection. Major dural sinuses are grossly patent. Note made of an empty sella. Suprasellar region normal. Midline  structures intact. No abnormal enhancement. Vascular: Major intracranial vascular flow voids well maintained and normal in appearance. Skull and upper cervical spine: Craniocervical junction within normal limits. Visualized bone marrow signal intensity diffusely heterogeneous without discrete or worrisome osseous lesion. Scalp soft tissues unremarkable. Sinuses/Orbits: Globes orbital soft tissues within normal limits. Scattered mucosal thickening throughout the ethmoidal air cells and maxillary sinuses. Paranasal sinuses are otherwise clear. No mastoid effusion. Inner ear structures normal. Other: None. MRA HEAD FINDINGS ANTERIOR CIRCULATION: Distal cervical segments of the internal carotid arteries are patent with symmetric antegrade flow. Petrous, cavernous, and supraclinoid segments patent without hemodynamically significant stenosis. Origin of the ophthalmic arteries patent bilaterally. A1 segments widely patent. Normal  anterior communicating artery. Anterior cerebral arteries widely patent to their distal aspects without stenosis. No M1 stenosis or occlusion. Normal MCA bifurcations. Distal MCA branches well perfused and symmetric. POSTERIOR CIRCULATION: Vertebral arteries patent to the vertebrobasilar junction without stenosis. Right vertebral artery dominant. Posterior inferior cerebral arteries patent bilaterally. Basilar widely patent to its distal aspect. Superior cerebral arteries patent bilaterally. Right PCA supplied via the basilar as well as a prominent right posterior communicating artery. Fetal type origin of the left PCA noted. PCAs well perfused to their distal aspects without stenosis. No intracranial aneurysm or other vascular abnormality. MRA NECK FINDINGS Source images reviewed. Visualized aortic arch of normal caliber with normal 3 vessel morphology. No flow-limiting stenosis seen about the origin of the great vessels. Visualized subclavian arteries widely patent. Right common carotid artery  patent from its origin to the bifurcation without stenosis. No significant atheromatous narrowing about the right bifurcation. Right ICA tortuous but widely patent to the circle-of-Willis without stenosis or occlusion. Left common carotid artery patent from its origin to the bifurcation without stenosis. No significant atheromatous narrowing about the left bifurcation. Left ICA mildly tortuous but widely patent distally to the circle-of-Willis without stenosis or occlusion. Both of the vertebral arteries arise from the subclavian arteries. Origin of the vertebral arteries not well assessed on this examination. Right vertebral artery slightly dominant. Vertebral arteries mildly tortuous but widely patent bilaterally without stenosis or occlusion. IMPRESSION: MRI HEAD IMPRESSION: 1. No acute intracranial infarct or other abnormality identified. 2. Generalized age-related cerebral atrophy with mild chronic small vessel ischemic disease. 3. Empty sella. MRA HEAD IMPRESSION: Negative intracranial MRA. No large vessel occlusion. No hemodynamically significant or correctable stenosis. MRA NECK IMPRESSION: Negative MRA of the neck. No critical flow limiting stenosis or other acute vascular abnormality identified. Electronically Signed   By: Jeannine Boga M.D.   On: 07/30/2018 02:47   Mr Jodene Nam Neck W Wo Contrast  Result Date: 07/30/2018 CLINICAL DATA:  Initial evaluation for trans and bilateral visual loss, TIA. EXAM: MRI HEAD WITHOUT AND WITH CONTRAST MRA HEAD WITHOUT CONTRAST MRA NECK WITHOUT AND WITH CONTRAST TECHNIQUE: Multiplanar, multiecho pulse sequences of the brain and surrounding structures were obtained without intravenous contrast. Angiographic images of the Circle of Willis were obtained using MRA technique without intravenous contrast. Angiographic images of the neck were obtained using MRA technique without and with intravenous contrast. Carotid stenosis measurements (when applicable) are obtained  utilizing NASCET criteria, using the distal internal carotid diameter as the denominator. CONTRAST:  10 cc of Gadavist. COMPARISON:  Comparison made with prior CT from 07/29/2018. FINDINGS: MRI HEAD FINDINGS Brain: Diffuse prominence of the CSF containing spaces compatible with generalized age-related cerebral atrophy. Patchy and confluent T2/FLAIR hyperintensity within the periventricular deep white matter both cerebral hemispheres most like related chronic microvascular ischemic disease, mild for age. No abnormal foci of restricted diffusion to suggest acute or subacute ischemia. Gray-white matter differentiation well maintained. No encephalomalacia to suggest chronic infarction. No foci of susceptibility artifact to suggest acute or chronic intracranial hemorrhage. No mass lesion, midline shift or mass effect. No hydrocephalus. No extra-axial fluid collection. Major dural sinuses are grossly patent. Note made of an empty sella. Suprasellar region normal. Midline structures intact. No abnormal enhancement. Vascular: Major intracranial vascular flow voids well maintained and normal in appearance. Skull and upper cervical spine: Craniocervical junction within normal limits. Visualized bone marrow signal intensity diffusely heterogeneous without discrete or worrisome osseous lesion. Scalp soft tissues unremarkable. Sinuses/Orbits: Globes orbital soft tissues within normal  limits. Scattered mucosal thickening throughout the ethmoidal air cells and maxillary sinuses. Paranasal sinuses are otherwise clear. No mastoid effusion. Inner ear structures normal. Other: None. MRA HEAD FINDINGS ANTERIOR CIRCULATION: Distal cervical segments of the internal carotid arteries are patent with symmetric antegrade flow. Petrous, cavernous, and supraclinoid segments patent without hemodynamically significant stenosis. Origin of the ophthalmic arteries patent bilaterally. A1 segments widely patent. Normal anterior communicating artery.  Anterior cerebral arteries widely patent to their distal aspects without stenosis. No M1 stenosis or occlusion. Normal MCA bifurcations. Distal MCA branches well perfused and symmetric. POSTERIOR CIRCULATION: Vertebral arteries patent to the vertebrobasilar junction without stenosis. Right vertebral artery dominant. Posterior inferior cerebral arteries patent bilaterally. Basilar widely patent to its distal aspect. Superior cerebral arteries patent bilaterally. Right PCA supplied via the basilar as well as a prominent right posterior communicating artery. Fetal type origin of the left PCA noted. PCAs well perfused to their distal aspects without stenosis. No intracranial aneurysm or other vascular abnormality. MRA NECK FINDINGS Source images reviewed. Visualized aortic arch of normal caliber with normal 3 vessel morphology. No flow-limiting stenosis seen about the origin of the great vessels. Visualized subclavian arteries widely patent. Right common carotid artery patent from its origin to the bifurcation without stenosis. No significant atheromatous narrowing about the right bifurcation. Right ICA tortuous but widely patent to the circle-of-Willis without stenosis or occlusion. Left common carotid artery patent from its origin to the bifurcation without stenosis. No significant atheromatous narrowing about the left bifurcation. Left ICA mildly tortuous but widely patent distally to the circle-of-Willis without stenosis or occlusion. Both of the vertebral arteries arise from the subclavian arteries. Origin of the vertebral arteries not well assessed on this examination. Right vertebral artery slightly dominant. Vertebral arteries mildly tortuous but widely patent bilaterally without stenosis or occlusion. IMPRESSION: MRI HEAD IMPRESSION: 1. No acute intracranial infarct or other abnormality identified. 2. Generalized age-related cerebral atrophy with mild chronic small vessel ischemic disease. 3. Empty sella. MRA  HEAD IMPRESSION: Negative intracranial MRA. No large vessel occlusion. No hemodynamically significant or correctable stenosis. MRA NECK IMPRESSION: Negative MRA of the neck. No critical flow limiting stenosis or other acute vascular abnormality identified. Electronically Signed   By: Jeannine Boga M.D.   On: 07/30/2018 02:47   Mr Jeri Cos And Wo Contrast  Result Date: 07/30/2018 CLINICAL DATA:  Initial evaluation for trans and bilateral visual loss, TIA. EXAM: MRI HEAD WITHOUT AND WITH CONTRAST MRA HEAD WITHOUT CONTRAST MRA NECK WITHOUT AND WITH CONTRAST TECHNIQUE: Multiplanar, multiecho pulse sequences of the brain and surrounding structures were obtained without intravenous contrast. Angiographic images of the Circle of Willis were obtained using MRA technique without intravenous contrast. Angiographic images of the neck were obtained using MRA technique without and with intravenous contrast. Carotid stenosis measurements (when applicable) are obtained utilizing NASCET criteria, using the distal internal carotid diameter as the denominator. CONTRAST:  10 cc of Gadavist. COMPARISON:  Comparison made with prior CT from 07/29/2018. FINDINGS: MRI HEAD FINDINGS Brain: Diffuse prominence of the CSF containing spaces compatible with generalized age-related cerebral atrophy. Patchy and confluent T2/FLAIR hyperintensity within the periventricular deep white matter both cerebral hemispheres most like related chronic microvascular ischemic disease, mild for age. No abnormal foci of restricted diffusion to suggest acute or subacute ischemia. Gray-white matter differentiation well maintained. No encephalomalacia to suggest chronic infarction. No foci of susceptibility artifact to suggest acute or chronic intracranial hemorrhage. No mass lesion, midline shift or mass effect. No hydrocephalus. No extra-axial fluid collection.  Major dural sinuses are grossly patent. Note made of an empty sella. Suprasellar region  normal. Midline structures intact. No abnormal enhancement. Vascular: Major intracranial vascular flow voids well maintained and normal in appearance. Skull and upper cervical spine: Craniocervical junction within normal limits. Visualized bone marrow signal intensity diffusely heterogeneous without discrete or worrisome osseous lesion. Scalp soft tissues unremarkable. Sinuses/Orbits: Globes orbital soft tissues within normal limits. Scattered mucosal thickening throughout the ethmoidal air cells and maxillary sinuses. Paranasal sinuses are otherwise clear. No mastoid effusion. Inner ear structures normal. Other: None. MRA HEAD FINDINGS ANTERIOR CIRCULATION: Distal cervical segments of the internal carotid arteries are patent with symmetric antegrade flow. Petrous, cavernous, and supraclinoid segments patent without hemodynamically significant stenosis. Origin of the ophthalmic arteries patent bilaterally. A1 segments widely patent. Normal anterior communicating artery. Anterior cerebral arteries widely patent to their distal aspects without stenosis. No M1 stenosis or occlusion. Normal MCA bifurcations. Distal MCA branches well perfused and symmetric. POSTERIOR CIRCULATION: Vertebral arteries patent to the vertebrobasilar junction without stenosis. Right vertebral artery dominant. Posterior inferior cerebral arteries patent bilaterally. Basilar widely patent to its distal aspect. Superior cerebral arteries patent bilaterally. Right PCA supplied via the basilar as well as a prominent right posterior communicating artery. Fetal type origin of the left PCA noted. PCAs well perfused to their distal aspects without stenosis. No intracranial aneurysm or other vascular abnormality. MRA NECK FINDINGS Source images reviewed. Visualized aortic arch of normal caliber with normal 3 vessel morphology. No flow-limiting stenosis seen about the origin of the great vessels. Visualized subclavian arteries widely patent. Right common  carotid artery patent from its origin to the bifurcation without stenosis. No significant atheromatous narrowing about the right bifurcation. Right ICA tortuous but widely patent to the circle-of-Willis without stenosis or occlusion. Left common carotid artery patent from its origin to the bifurcation without stenosis. No significant atheromatous narrowing about the left bifurcation. Left ICA mildly tortuous but widely patent distally to the circle-of-Willis without stenosis or occlusion. Both of the vertebral arteries arise from the subclavian arteries. Origin of the vertebral arteries not well assessed on this examination. Right vertebral artery slightly dominant. Vertebral arteries mildly tortuous but widely patent bilaterally without stenosis or occlusion. IMPRESSION: MRI HEAD IMPRESSION: 1. No acute intracranial infarct or other abnormality identified. 2. Generalized age-related cerebral atrophy with mild chronic small vessel ischemic disease. 3. Empty sella. MRA HEAD IMPRESSION: Negative intracranial MRA. No large vessel occlusion. No hemodynamically significant or correctable stenosis. MRA NECK IMPRESSION: Negative MRA of the neck. No critical flow limiting stenosis or other acute vascular abnormality identified. Electronically Signed   By: Jeannine Boga M.D.   On: 07/30/2018 02:47      Antonietta Breach, PA-C 07/30/18 0329    Fatima Blank, MD 07/30/18 [phone]

## 2018-07-31 LAB — URINE CULTURE: Culture: >=100000 — AB

## 2018-08-01 ENCOUNTER — Telehealth: Payer: Self-pay

## 2018-08-01 ENCOUNTER — Other Ambulatory Visit: Payer: Self-pay | Admitting: Internal Medicine

## 2018-08-01 ENCOUNTER — Telehealth: Payer: Self-pay | Admitting: Interventional Cardiology

## 2018-08-01 DIAGNOSIS — Z86718 Personal history of other venous thrombosis and embolism: Secondary | ICD-10-CM | POA: Diagnosis not present

## 2018-08-01 DIAGNOSIS — M7989 Other specified soft tissue disorders: Secondary | ICD-10-CM | POA: Diagnosis not present

## 2018-08-01 NOTE — Telephone Encounter (Signed)
She can take an extra HCTZ this weekend.

## 2018-08-01 NOTE — Telephone Encounter (Signed)
New Message             Pt c/o swelling: STAT is pt has developed SOB within 24 hours  1) How much weight have you gained and in what time span? Not sure  2) If swelling, where is the swelling located? Lf Leg  3) Are you currently taking a fluid pill?   4) Are you currently SOB? Yes while walking   5) Do you have a log of your daily weights (if so, list)? No  6) Have you gained 3 pounds in a day or 5 pounds in a week? No  7) Have you traveled recently? No

## 2018-08-01 NOTE — Telephone Encounter (Signed)
Pt states that she has had swelling in her left lower leg for several days.  No vitals available. She has gained 10lbs since she was seen in February. She gets winded when walking to and from her mailbox.  Takes HCTZ 25mg  QD.  Denies increased salt in diet.  Advised to elevate.  Scheduled for virtual visit on Monday and advised I will send to Dr. Tamala Julian for review to see if any recommendations prior to appt.

## 2018-08-01 NOTE — Telephone Encounter (Signed)
Spoke with pt and went over recommendations.  Pt verbalized understanding.

## 2018-08-01 NOTE — Telephone Encounter (Signed)
Post ED Visit - Positive Culture Follow-up  Culture report reviewed by antimicrobial stewardship pharmacist: [place] Team []  Elenor Quinones, Pharm.D. []  Heide Guile, Pharm.D., BCPS AQ-ID []  Parks Neptune, Pharm.D., BCPS []  Alycia Rossetti, Pharm.D., BCPS []  [place], Pharm.D., BCPS, AAHIVP []  Legrand Como, Pharm.D., BCPS, AAHIVP []  Salome Arnt, PharmD, BCPS []  Johnnette Gourd, PharmD, BCPS [x]  Hughes Better, PharmD, BCPS []  Leeroy Cha, PharmD []  Laqueta Linden, PharmD, BCPS []  Albertina Parr, PharmD  [place] Team []  Leodis Sias, PharmD []  Lindell Spar, PharmD []  Royetta Asal, PharmD []  Graylin Shiver, Rph []  Rema Fendt) Glennon Mac, PharmD []  Arlyn Dunning, PharmD []  Netta Cedars, PharmD []  Dia Sitter, PharmD []  Leone Haven, PharmD []  Gretta Arab, PharmD []  Theodis Shove, PharmD []  Peggyann Juba, PharmD []  Reuel Boom, PharmD   Positive urine culture Treated with Macrobid, organism sensitive to the same and no further patient follow-up is required at this time.  Genia Del 08/01/2018, 10:07 AM

## 2018-08-03 NOTE — Progress Notes (Signed)
Virtual Visit via Video Note   This visit type was conducted due to national recommendations for restrictions regarding the COVID-19 Pandemic (e.g. social distancing) in an effort to limit this patient's exposure and mitigate transmission in our community.  Due to her co-morbid illnesses, this patient is at least at moderate risk for complications without adequate follow up.  This format is felt to be most appropriate for this patient at this time.  All issues noted in this document were discussed and addressed.  A limited physical exam was performed with this format.  Please refer to the patient's chart for her consent to telehealth for [facility].   Date:  08/03/2018   ID:  [name], DOB [birth year], MRN [id number]  Patient Location: Home Provider Location: Home  PCP:  Josetta Huddle, MD  Cardiologist:  No primary care provider on file.  Electrophysiologist:  None   Evaluation Performed:  Follow-Up Visit  Chief Complaint:  DOE/Htn/ANeurysm  History of Present Illness:    Denise Cline is a 78 y.o. female with with a hx of CAD identified by screening CT scan due to the presence of calcification, ascending thoracic aneurysm, COPD, essential hypertension, PVD, severe hyperlipidemia with LDL target less than 70,and concomitant COPD.  Overall, the patient is doing well.  She saw Dr. Inda Merlin earlier this morning.  She has been concerned about dyspnea on exertion and left lower extremity swelling.  She is on full dose Xarelto therapy because of prior pulmonary emboli.  Laboratory data was obtained this morning.  Late last week we asked her to double up on HCTZ.  This has not made a difference in the weight gain that she is noticed.  Dyspnea is occurring with exertion but not at rest.  The patient does not have symptoms concerning for COVID-19 infection (fever, chills, cough, or new shortness of breath).    Past Medical History:  Diagnosis Date  . Anxiety   . COPD (chronic  obstructive pulmonary disease) ([place])   . DVT (deep venous thrombosis) ([employer]) 2014, 2017  . Fibroid   . History of shingles   . Hypertension   . PE (pulmonary thromboembolism) ([place]) 2017  . Thyroid disease    Nodules  . Varicose veins    Past Surgical History:  Procedure Laterality Date  . ABDOMINAL HYSTERECTOMY    . CHOLECYSTECTOMY N/A 11/05/2017   Procedure: LAPAROSCOPIC CHOLECYSTECTOMY WITH INTRAOPERATIVE CHOLANGIOGRAM;  Surgeon: Jovita Kussmaul, MD;  Location: [place];  Service: General;  Laterality: N/A;  . COLONOSCOPY N/A 10/05/2014   Procedure: COLONOSCOPY;  Surgeon: Ronald Lobo, MD;  Location: [facility];  Service: Endoscopy;  Laterality: N/A;  . ERCP N/A 11/04/2017   Procedure: ENDOSCOPIC RETROGRADE CHOLANGIOPANCREATOGRAPHY (ERCP) Balloon Extraction;  Surgeon: Ronnette Juniper, MD;  Location: [place];  Service: Gastroenterology;  Laterality: N/A;  . LAPAROSCOPIC CHOLECYSTECTOMY W/ CHOLANGIOGRAPHY  11/05/2017   Dr. Marlou Starks  . OOPHORECTOMY     One ovary removed  . SPHINCTEROTOMY  11/04/2017   Procedure: SPHINCTEROTOMY;  Surgeon: Ronnette Juniper, MD;  Location[facility] END[facility]COPY;  Service: Gastroenterology;;  . TOTAL THYROIDECTOMY       No outpatient medications have been marked as taking for the 08/04/18 encounter (Appointment) with Belva Crome, MD.     Allergies:   Penicillins; Sulfa antibiotics; Citrus; Oxycodone; and Vicodin [hydrocodone-acetaminophen]   Social History   Tobacco Use  . Smoking status: Former Smoker    Packs/day: 2.00    Years: 40.00    Pack years: 80.00  Types: Cigarettes    Last attempt to quit: 05/30/2008    Years since quitting: 10.1  . Smokeless tobacco: Former Systems developer    Quit date: 05/12/2008  . Tobacco comment: Counseled to remain smoke free  Substance Use Topics  . Alcohol use: No    Alcohol/week: 0.0 standard drinks    Comment: Rare  . Drug use: No     Family Hx: The patient's family history includes Crohn's disease in her mother; Diabetes in her  sister; Hypertension in her brother and sister; Stroke in her father.  ROS:   Please see the history of present illness.    Denies blood in the stool and urine. All other systems reviewed and are negative.   Prior CV studies:   The following studies were reviewed today:  No recent CV studies or imaging.  Labs/Other Tests and Data Reviewed:    EKG:  No ECG reviewed.  Recent Labs: 11/05/2017: TSH 0.403 11/06/2017: Magnesium 1.7 07/29/2018: ALT 16; BUN 22; Creatinine, Ser 1.06; Hemoglobin 12.6; Platelets 254; Potassium 4.1; Sodium 141   Recent Lipid Panel Lab Results  Component Value Date/Time   CHOL 204 (H) 12/25/2017 08:16 AM   TRIG 96 12/25/2017 08:16 AM   HDL 60 12/25/2017 08:16 AM   CHOLHDL 3.4 12/25/2017 08:16 AM   CHOLHDL 3.7 05/15/2009 05:55 AM   LDLCALC 125 (H) 12/25/2017 08:16 AM    Wt Readings from Last 3 Encounters:  07/29/18 200 lb 9.9 oz (91 kg)  04/14/18 201 lb 6.4 oz (91.4 kg)  12/11/17 195 lb (88.5 kg)     Objective:    Vital Signs:  There were no vitals taken for this visit.   VITAL SIGNS:  reviewed  ASSESSMENT & PLAN:    1. Peripheral edema   2. Coronary artery calcification seen on CAT scan   3. DOE (dyspnea on exertion)   4. Hyperlipidemia with target LDL less than 100   5. Thoracic aortic aneurysm without rupture ([place])   6. Essential hypertension   7. Educated About Covid-19 Virus Infection    PLAN:  1. Left leg edema possibly related to chronic venous insufficiency given her relatively recent history of pulmonary embolism.  Compliance with Xarelto is advocated by the patient. 2. No symptoms suggest angina pectoris. 3. Not addressed. 4. Thoracic aneurysm is being followed. 5. Target blood pressure 130/80 mmHg, is being achieved.  Clinical observation.  If weight continues to climb, will see the patient in person to determine if there is any evidence of heart failure.  We will follow-up with the patient in 6 months relative to blood  pressure/aneurysm.  COVID-19 Education: The signs and symptoms of COVID-19 were discussed with the patient and how to seek care for testing (follow up with PCP or arrange E-visit).  The importance of social distancing was discussed today.  Time:   Today, I have spent 15 minutes with the patient with telehealth technology discussing the above problems.     Medication Adjustments/Labs and Tests Ordered: Current medicines are reviewed at length with the patient today.  Concerns regarding medicines are outlined above.   Tests Ordered: No orders of the defined types were placed in this encounter.   Medication Changes: No orders of the defined types were placed in this encounter.   Disposition:  Follow up in 6 month(s)  Signed, Sinclair Grooms, MD  08/03/2018 10:11 AM    [place]

## 2018-08-04 ENCOUNTER — Other Ambulatory Visit: Payer: Self-pay

## 2018-08-04 ENCOUNTER — Encounter: Payer: Self-pay | Admitting: Interventional Cardiology

## 2018-08-04 ENCOUNTER — Telehealth (INDEPENDENT_AMBULATORY_CARE_PROVIDER_SITE_OTHER): Payer: Medicare HMO | Admitting: Interventional Cardiology

## 2018-08-04 ENCOUNTER — Telehealth: Payer: Self-pay

## 2018-08-04 ENCOUNTER — Encounter: Payer: Self-pay | Admitting: Internal Medicine

## 2018-08-04 VITALS — BP 128/84 | HR 62 | Temp 96.8°F | Ht 69.0 in | Wt 211.0 lb

## 2018-08-04 DIAGNOSIS — R609 Edema, unspecified: Secondary | ICD-10-CM

## 2018-08-04 DIAGNOSIS — I2510 Atherosclerotic heart disease of native coronary artery without angina pectoris: Secondary | ICD-10-CM | POA: Diagnosis not present

## 2018-08-04 DIAGNOSIS — G629 Polyneuropathy, unspecified: Secondary | ICD-10-CM | POA: Diagnosis not present

## 2018-08-04 DIAGNOSIS — I712 Thoracic aortic aneurysm, without rupture, unspecified: Secondary | ICD-10-CM

## 2018-08-04 DIAGNOSIS — E039 Hypothyroidism, unspecified: Secondary | ICD-10-CM | POA: Diagnosis not present

## 2018-08-04 DIAGNOSIS — E785 Hyperlipidemia, unspecified: Secondary | ICD-10-CM

## 2018-08-04 DIAGNOSIS — M25512 Pain in left shoulder: Secondary | ICD-10-CM | POA: Diagnosis not present

## 2018-08-04 DIAGNOSIS — Z86718 Personal history of other venous thrombosis and embolism: Secondary | ICD-10-CM | POA: Diagnosis not present

## 2018-08-04 DIAGNOSIS — Z7189 Other specified counseling: Secondary | ICD-10-CM

## 2018-08-04 DIAGNOSIS — R7309 Other abnormal glucose: Secondary | ICD-10-CM | POA: Diagnosis not present

## 2018-08-04 DIAGNOSIS — I10 Essential (primary) hypertension: Secondary | ICD-10-CM | POA: Diagnosis not present

## 2018-08-04 DIAGNOSIS — E162 Hypoglycemia, unspecified: Secondary | ICD-10-CM | POA: Diagnosis not present

## 2018-08-04 DIAGNOSIS — R0609 Other forms of dyspnea: Secondary | ICD-10-CM

## 2018-08-04 DIAGNOSIS — R0600 Dyspnea, unspecified: Secondary | ICD-10-CM

## 2018-08-04 DIAGNOSIS — M7989 Other specified soft tissue disorders: Secondary | ICD-10-CM | POA: Diagnosis not present

## 2018-08-04 NOTE — Patient Instructions (Signed)
Medication Instructions:  1) DISCONTINUE Aspirin  If you need a refill on your cardiac medications before your next appointment, please call your pharmacy.   Lab work: None If you have labs (blood work) drawn today and your tests are completely normal, you will receive your results only by: Marland Kitchen MyChart Message (if you have MyChart) OR . A paper copy in the mail If you have any lab test that is abnormal or we need to change your treatment, we will call you to review the results.  Testing/Procedures: None  Follow-Up: At [facility], you and your health needs are our priority.  As part of our continuing mission to provide you with exceptional heart care, we have created designated Provider Care Teams.  These Care Teams include your primary Cardiologist (physician) and Advanced Practice Providers (APPs -  Physician Assistants and Nurse Practitioners) who all work together to provide you with the care you need, when you need it. You will need a follow up appointment in 6 months.  Please call our office 2 months in advance to schedule this appointment.  You may see Dr. Tamala Julian or one of the following Advanced Practice Providers on your designated Care Team:   Truitt Merle, NP Cecilie Kicks, NP . Kathyrn Drown, NP  Any Other Special Instructions Will Be Listed Below (If Applicable).

## 2018-08-04 NOTE — Telephone Encounter (Signed)
YOUR CARDIOLOGY TEAM HAS ARRANGED FOR AN E-VISIT FOR YOUR APPOINTMENT - PLEASE REVIEW IMPORTANT INFORMATION BELOW SEVERAL DAYS PRIOR TO YOUR APPOINTMENT  Due to the recent COVID-19 pandemic, we are transitioning in-person office visits to tele-medicine visits in an effort to decrease unnecessary exposure to our patients, their families, and staff. These visits are billed to your insurance just like a normal visit is. We also encourage you to sign up for MyChart if you have not already done so. You will need a smartphone if possible. For patients that do not have this, we can still complete the visit using a regular telephone but do prefer a smartphone to enable video when possible. You may have a family member that lives with you that can help. If possible, we also ask that you have a blood pressure cuff and scale at home to measure your blood pressure, heart rate and weight prior to your scheduled appointment. Patients with clinical needs that need an in-person evaluation and testing will still be able to come to the office if absolutely necessary. If you have any questions, feel free to call our office.     YOUR PROVIDER WILL BE USING THE FOLLOWING PLATFORM TO COMPLETE YOUR VISIT: Doximity  . IF USING MYCHART - How to Download the MyChart App to Your SmartPhone   - If Apple, go to App Store and type in MyChart in the search bar and download the app. If Android, ask patient to go to Google Play Store and type in MyChart in the search bar and download the app. The app is free but as with any other app downloads, your phone may require you to verify saved payment information or Apple/Android password.  - You will need to then log into the app with your MyChart username and password, and select [place] as your healthcare provider to link the account.  - When it is time for your visit, go to the MyChart app, find appointments, and click Begin Video Visit. Be sure to Select Allow for your device to  access the Microphone and Camera for your visit. You will then be connected, and your provider will be with you shortly.  **If you have any issues connecting or need assistance, please contact MyChart service desk [phone] ([phone])**  **If using a computer, in order to ensure the best quality for your visit, you will need to use either of the following Internet Browsers: Google Chrome or Microsoft Edge**  . IF USING DOXIMITY or [email or web address] - The staff will give you instructions on receiving your link to join the meeting the day of your visit.      2-3 DAYS BEFORE YOUR APPOINTMENT  You will receive a telephone call from one of our HeartCare team members - your caller ID may say "Unknown caller." If this is a video visit, we will walk you through how to get the video launched on your phone. We will remind you check your blood pressure, heart rate and weight prior to your scheduled appointment. If you have an Apple Watch or Kardia, please upload any pertinent ECG strips the day before or morning of your appointment to MyChart. Our staff will also make sure you have reviewed the consent and agree to move forward with your scheduled tele-health visit.     THE DAY OF YOUR APPOINTMENT  Approximately 15 minutes prior to your scheduled appointment, you will receive a telephone call from one of HeartCare team - your caller ID may say "Unknown caller."    Our staff will confirm medications, vital signs for the day and any symptoms you may be experiencing. Please have this information available prior to the time of visit start. It may also be helpful for you to have a pad of paper and pen handy for any instructions given during your visit. They will also walk you through joining the smartphone meeting if this is a video visit.    CONSENT FOR TELE-HEALTH VISIT - PLEASE REVIEW  I hereby voluntarily request, consent and authorize [facility] and its employed or contracted physicians, physician  assistants, nurse practitioners or other licensed health care professionals (the Practitioner), to provide me with telemedicine health care services (the "Services") as deemed necessary by the treating Practitioner. I acknowledge and consent to receive the Services by the Practitioner via telemedicine. I understand that the telemedicine visit will involve communicating with the Practitioner through live audiovisual communication technology and the disclosure of certain medical information by electronic transmission. I acknowledge that I have been given the opportunity to request an in-person assessment or other available alternative prior to the telemedicine visit and am voluntarily participating in the telemedicine visit.  I understand that I have the right to withhold or withdraw my consent to the use of telemedicine in the course of my care at any time, without affecting my right to future care or treatment, and that the Practitioner or I may terminate the telemedicine visit at any time. I understand that I have the right to inspect all information obtained and/or recorded in the course of the telemedicine visit and may receive copies of available information for a reasonable fee.  I understand that some of the potential risks of receiving the Services via telemedicine include:  . Delay or interruption in medical evaluation due to technological equipment failure or disruption; . Information transmitted may not be sufficient (e.g. poor resolution of images) to allow for appropriate medical decision making by the Practitioner; and/or  . In rare instances, security protocols could fail, causing a breach of personal health information.  Furthermore, I acknowledge that it is my responsibility to provide information about my medical history, conditions and care that is complete and accurate to the best of my ability. I acknowledge that Practitioner's advice, recommendations, and/or decision may be based on  factors not within their control, such as incomplete or inaccurate data provided by me or distortions of diagnostic images or specimens that may result from electronic transmissions. I understand that the practice of medicine is not an exact science and that Practitioner makes no warranties or guarantees regarding treatment outcomes. I acknowledge that I will receive a copy of this consent concurrently upon execution via email to the email address I last provided but may also request a printed copy by calling the office of [facility].    I understand that my insurance will be billed for this visit.   I have read or had this consent read to me. . I understand the contents of this consent, which adequately explains the benefits and risks of the Services being provided via telemedicine.  . I have been provided ample opportunity to ask questions regarding this consent and the Services and have had my questions answered to my satisfaction. . I give my informed consent for the services to be provided through the use of telemedicine in my medical care  By participating in this telemedicine visit I agree to the above.  

## 2018-08-14 ENCOUNTER — Ambulatory Visit
Admission: RE | Admit: 2018-08-14 | Discharge: 2018-08-14 | Disposition: A | Discharge status: Home / Self Care | Payer: Medicare HMO | Source: Ambulatory Visit | Attending: Internal Medicine | Admitting: Internal Medicine

## 2018-08-14 DIAGNOSIS — M79605 Pain in left leg: Secondary | ICD-10-CM | POA: Diagnosis not present

## 2018-08-14 DIAGNOSIS — M7989 Other specified soft tissue disorders: Secondary | ICD-10-CM | POA: Diagnosis not present

## 2018-09-18 ENCOUNTER — Ambulatory Visit: Payer: PRIVATE HEALTH INSURANCE | Admitting: Licensed Clinical Social Worker

## 2018-09-18 ENCOUNTER — Other Ambulatory Visit: Payer: Self-pay

## 2018-10-02 DIAGNOSIS — Z86718 Personal history of other venous thrombosis and embolism: Secondary | ICD-10-CM | POA: Diagnosis not present

## 2018-10-02 DIAGNOSIS — M7989 Other specified soft tissue disorders: Secondary | ICD-10-CM | POA: Diagnosis not present

## 2018-10-02 DIAGNOSIS — I2510 Atherosclerotic heart disease of native coronary artery without angina pectoris: Secondary | ICD-10-CM | POA: Diagnosis not present

## 2018-10-02 DIAGNOSIS — I10 Essential (primary) hypertension: Secondary | ICD-10-CM | POA: Diagnosis not present

## 2018-10-02 DIAGNOSIS — G629 Polyneuropathy, unspecified: Secondary | ICD-10-CM | POA: Diagnosis not present

## 2018-10-02 DIAGNOSIS — E039 Hypothyroidism, unspecified: Secondary | ICD-10-CM | POA: Diagnosis not present

## 2018-10-02 DIAGNOSIS — H579 Unspecified disorder of eye and adnexa: Secondary | ICD-10-CM | POA: Diagnosis not present

## 2018-10-15 ENCOUNTER — Ambulatory Visit (INDEPENDENT_AMBULATORY_CARE_PROVIDER_SITE_OTHER): Payer: Medicare HMO

## 2018-10-15 ENCOUNTER — Ambulatory Visit
Admission: EM | Admit: 2018-10-15 | Discharge: 2018-10-15 | Disposition: A | Discharge status: Home / Self Care | Payer: Medicare HMO | Attending: Physician Assistant | Admitting: Physician Assistant

## 2018-10-15 DIAGNOSIS — M546 Pain in thoracic spine: Secondary | ICD-10-CM | POA: Diagnosis not present

## 2018-10-15 DIAGNOSIS — M25562 Pain in left knee: Secondary | ICD-10-CM

## 2018-10-15 DIAGNOSIS — Z20828 Contact with and (suspected) exposure to other viral communicable diseases: Secondary | ICD-10-CM | POA: Diagnosis not present

## 2018-10-15 DIAGNOSIS — Z7689 Persons encountering health services in other specified circumstances: Secondary | ICD-10-CM | POA: Diagnosis not present

## 2018-10-15 DIAGNOSIS — R059 Cough, unspecified: Secondary | ICD-10-CM

## 2018-10-15 DIAGNOSIS — R05 Cough: Secondary | ICD-10-CM | POA: Diagnosis not present

## 2018-10-15 DIAGNOSIS — R0602 Shortness of breath: Secondary | ICD-10-CM | POA: Diagnosis not present

## 2018-10-15 MED ORDER — PREDNISONE 50 MG PO TABS: 50.0000 mg | ORAL_TABLET | Freq: Every day | ORAL | 0 refills | Status: DC

## 2018-10-15 NOTE — ED Provider Notes (Signed)
[name]    CSN: [id number] Arrival date & time: 10/15/18  1857     History   Chief Complaint Chief Complaint  Patient presents with  . Cough    HPI Denise Cline is a 78 y.o. female.   78 year old female with history of COPD, DVT/PE on Xarelto, comes in for evaluation of 2-day history of cough.  Cough is worse at nighttime, mostly nonproductive.  She denies rhinorrhea, nasal congestion, sore throat.  Denies fever, chills.  States has right-sided back pain and left knee pain, but denies diffuse/generalized body aches. Denies injury/trauma.  She has shortness of breath with ambulation, denies chest pain, wheezing, palpitations.  Denies abdominal pain, nausea, vomiting.  Denies loss of taste or smell.  Denies travels, sick contact.  Denies orthopnea.  She has chronic left lower leg swelling without worsening.     Past Medical History:  Diagnosis Date  . Anxiety   . COPD (chronic obstructive pulmonary disease) ([place])   . DVT (deep venous thrombosis) ([place]) 2014, 2017  . Fibroid   . History of shingles   . Hypertension   . PE (pulmonary thromboembolism) ([place]) 2017  . Thyroid disease    Nodules  . Varicose veins     Patient Active Problem List   Diagnosis Date Noted  . Choledocholithiasis with acute cholecystitis with obstruction 11/02/2017  . Chronic kidney disease, stage 3 ([place]) 11/02/2017  . DVT (deep venous thrombosis) ([place]) 11/12/2015  . Pulmonary emboli ([place]) 11/11/2015  . Acute pulmonary embolism ([place]) 11/11/2015  . Coronary artery calcification seen on CAT scan 05/12/2015  . Thoracic aortic aneurysm ([place]) 05/12/2015  . Dyspnea 03/17/2015  . Left leg swelling 03/16/2015  . Acute colitis 10/04/2014  . COPD (chronic obstructive pulmonary disease) ([place])   . PVD (peripheral vascular disease) ([place]) 04/01/2012  . Leg pain 04/01/2012  . Swelling of limb 04/01/2012  . History of shingles   . Hypertension   . Fibroid   . Thyroid disease   . Anxiety      Past Surgical History:  Procedure Laterality Date  . ABDOMINAL HYSTERECTOMY    . CHOLECYSTECTOMY N/A 11/05/2017   Procedure: LAPAROSCOPIC CHOLECYSTECTOMY WITH INTRAOPERATIVE CHOLANGIOGRAM;  Surgeon: Jovita Kussmaul, MD;  Location: [facility];  Service: General;  Laterality: N/A;  . COLONOSCOPY N/A 10/05/2014   Procedure: COLONOSCOPY;  Surgeon: Ronald Lobo, MD;  Location: [facility];  Service: Endoscopy;  Laterality: N/A;  . ERCP N/A 11/04/2017   Procedure: ENDOSCOPIC RETROGRADE CHOLANGIOPANCREATOGRAPHY (ERCP) Balloon Extraction;  Surgeon: Ronnette Juniper, MD;  Location: [facility];  Service: Gastroenterology;  Laterality: N/A;  . LAPAROSCOPIC CHOLECYSTECTOMY W/ CHOLANGIOGRAPHY  11/05/2017   Dr. Marlou Starks  . OOPHORECTOMY     One ovary removed  . SPHINCTEROTOMY  11/04/2017   Procedure: SPHINCTEROTOMY;  Surgeon: Ronnette Juniper, MD;  Location: [facility] ENDOSCOPY;  Service: Gastroenterology;;  . TOTAL THYROIDECTOMY      OB History    Gravida  3   Para  3   Term  3   Preterm      AB      Living  3     SAB      TAB      Ectopic      Multiple      Live Births               Home Medications    Prior to Admission medications   Medication Sig Start Date End Date Taking? Authorizing Provider  acetaminophen (TYLENOL) 650  MG CR tablet Take 1,300 mg by mouth 2 (two) times a day.     [provider]  buPROPion (WELLBUTRIN SR) 150 MG 12 hr tablet Take 150 mg by mouth 2 (two) times daily.     [provider]  calcium-vitamin D (OSCAL WITH D) 250-125 MG-UNIT tablet Take 2 tablets by mouth daily.    [provider]  hydrochlorothiazide (HYDRODIURIL) 25 MG tablet Take 25 mg by mouth daily.  02/05/18   [provider]  losartan (COZAAR) 100 MG tablet Take 100 mg by mouth daily. 02/05/18   [provider]  metoprolol succinate (TOPROL-XL) 25 MG 24 hr tablet Take 0.5 tablets (12.5 mg total) by mouth daily. 04/14/18 04/09/19  Belva Crome, MD  Multiple  Vitamins-Minerals (CENTRUM SILVER ADULT 50+) TABS Take 1 tablet by mouth daily.    [provider]  nitrofurantoin, macrocrystal-monohydrate, (MACROBID) 100 MG capsule Take 1 capsule (100 mg total) by mouth 2 (two) times daily. 07/30/18   Antonietta Breach, PA-C  pantoprazole (PROTONIX) 40 MG tablet Take 1 tablet (40 mg total) by mouth daily. 10/21/17   Belva Crome, MD  polyethylene glycol [facility] / Floria Raveling) packet Take 17 g by mouth daily. 11/07/17   Rayburn, Floyce Stakes, PA-C  predniSONE (DELTASONE) 50 MG tablet Take 1 tablet (50 mg total) by mouth daily with breakfast. 10/15/18   Tasia Catchings, Amy V, PA-C  rivaroxaban (XARELTO) 20 MG TABS tablet Take 20 mg by mouth daily with supper.    [provider]  rosuvastatin (CRESTOR) 20 MG tablet Take 1 tablet (20 mg total) by mouth daily. 10/25/17 10/25/18  Belva Crome, MD  traMADol (ULTRAM) 50 MG tablet Take 1 tablet (50 mg total) by mouth every 6 (six) hours as needed for moderate pain. 08/23/15   Mesner, Corene Cornea, MD  triamcinolone (KENALOG) 0.025 % cream Apply 1 application topically daily as needed for itching. 03/29/17   [provider]    Family History Family History  Problem Relation Age of Onset  . Crohn's disease Mother   . Stroke Father   . Diabetes Sister   . Hypertension Sister   . Hypertension Brother     Social History Social History   Tobacco Use  . Smoking status: Former Smoker    Packs/day: 2.00    Years: 40.00    Pack years: 80.00    Types: Cigarettes    Quit date: 05/30/2008    Years since quitting: 10.3  . Smokeless tobacco: Former Systems developer    Quit date: 05/12/2008  . Tobacco comment: Counseled to remain smoke free  Substance Use Topics  . Alcohol use: No    Alcohol/week: 0.0 standard drinks    Comment: Rare  . Drug use: No     Allergies   Penicillins, Sulfa antibiotics, Citrus, Oxycodone, and Vicodin [hydrocodone-acetaminophen]   Review of Systems Review of Systems  Reason unable to perform ROS: See  HPI as above.     Physical Exam Triage Vital Signs ED Triage Vitals  Enc Vitals Group     BP 10/15/18 1909 (!) 127/91     Pulse Rate 10/15/18 1909 80     Resp 10/15/18 1909 18     Temp 10/15/18 1909 98.1 F (36.7 C)     Temp Source 10/15/18 1909 Oral     SpO2 10/15/18 1909 95 %     Weight --      Height --      Head Circumference --  Peak Flow --      Pain Score 10/15/18 1910 0     Pain Loc --      Pain Edu? --      Excl. in [place]? --    No data found.  Updated Vital Signs BP (!) 127/91 (BP Location: Left Arm)   Pulse 80   Temp 98.1 F (36.7 C) (Oral)   Resp 18   SpO2 95%   Physical Exam Constitutional:      General: She is not in acute distress.    Appearance: Normal appearance. She is not ill-appearing, toxic-appearing or diaphoretic.  HENT:     Head: Normocephalic and atraumatic.     Mouth/Throat:     Mouth: Mucous membranes are moist.     Pharynx: Oropharynx is clear. Uvula midline.  Neck:     Musculoskeletal: Normal range of motion and neck supple.  Cardiovascular:     Rate and Rhythm: Normal rate and regular rhythm.     Heart sounds: Normal heart sounds. No murmur. No friction rub. No gallop.   Pulmonary:     Effort: Pulmonary effort is normal. No accessory muscle usage, prolonged expiration, respiratory distress or retractions.     Comments: Lungs clear to auscultation without adventitious lung sounds. Musculoskeletal:     Comments: No rashes seen to the back. No tenderness to palpation of the back. Full ROM of shoulder, back.  No obvious swelling, contusion, warmth, erythema to the left knee. No tenderness to palpation of the knee. Full ROM. Strength normal and equal bilaterally.   Neurological:     General: No focal deficit present.     Mental Status: She is alert and oriented to person, place, and time.      UC Treatments / Results  Labs (all labs ordered are listed, but only abnormal results are displayed) Labs Reviewed  NOVEL CORONAVIRUS,  NAA    EKG   Radiology Dg Chest 2 View  Result Date: 10/15/2018 CLINICAL DATA:  Pt c/o dry cough x2 days and pain to center upper back off and on x1 wk. Hx of PE, COPD, and HTN. Former smoker. cough, sob x 2 days. h/o COPD EXAM: CHEST - 2 VIEW COMPARISON:  CT thorax 04/10/2018 FINDINGS: The heart size and mediastinal contours are within normal limits. Both lungs are clear. The visualized skeletal structures are unremarkable. IMPRESSION: No acute cardiopulmonary process. Electronically Signed   By: Suzy Bouchard M.D.   On: 10/15/2018 19:42    Procedures Procedures (including critical care time)  Medications Ordered in UC Medications - No data to display  Initial Impression / Assessment and Plan / UC Course  I have reviewed the triage vital signs and the nursing notes.  Pertinent labs & imaging results that were available during my care of the patient were reviewed by me and considered in my medical decision making (see chart for details).    Chest x-ray without acute cardiopulmonary processes.  Will start prednisone for possible COPD exacerbation.  COVID testing sent.  Patient to remain quarantined until results.  Push fluids.  Strict return precautions given.  Patient expresses understanding and agrees to plan.  Final Clinical Impressions(s) / UC Diagnoses   Final diagnoses:  Cough  Acute pain of left knee  Acute right-sided thoracic back pain    ED Prescriptions    Medication Sig Dispense Auth. Provider   predniSONE (DELTASONE) 50 MG tablet Take 1 tablet (50 mg total) by mouth daily with breakfast. 5 tablet Ok Edwards,  Torrie Mayers, Illene Silver, PA-C 10/15/18 [id number]

## 2018-10-15 NOTE — Discharge Instructions (Signed)
Your chest xray was normal. Start prednisone for cough and possible bronchitis. Prednisone can also decrease inflammation caused by arthritis, so may help with the knee. COVID testing sent. Currently, no alarming signs.  I would like you to quarantine until testing results. However, if you become more short of breath, have chest pain, feel weak/dizzy, need to go to the ED for further evaluation needed.

## 2018-10-15 NOTE — ED Triage Notes (Signed)
Pt c/o dry cough x2 days, lt knee pain, pain to center upper back off and on x1 wk.

## 2018-10-17 ENCOUNTER — Telehealth: Payer: Self-pay

## 2018-10-17 LAB — NOVEL CORONAVIRUS, NAA: SARS-CoV-2, NAA: NOT DETECTED

## 2018-10-17 NOTE — Telephone Encounter (Signed)
Received call from patient requesting Covid results.  Advised she is negative.

## 2018-10-20 ENCOUNTER — Encounter (HOSPITAL_COMMUNITY): Payer: Self-pay

## 2018-11-03 ENCOUNTER — Other Ambulatory Visit: Payer: Self-pay | Admitting: *Deleted

## 2018-11-03 DIAGNOSIS — I712 Thoracic aortic aneurysm, without rupture, unspecified: Secondary | ICD-10-CM

## 2018-11-03 NOTE — Progress Notes (Unsigned)
Ct

## 2018-11-10 ENCOUNTER — Other Ambulatory Visit: Payer: Self-pay | Admitting: Interventional Cardiology

## 2018-11-17 ENCOUNTER — Ambulatory Visit: Payer: PRIVATE HEALTH INSURANCE | Admitting: Licensed Clinical Social Worker

## 2018-11-18 DIAGNOSIS — R69 Illness, unspecified: Secondary | ICD-10-CM | POA: Diagnosis not present

## 2018-11-27 DIAGNOSIS — H5340 Unspecified visual field defects: Secondary | ICD-10-CM | POA: Diagnosis not present

## 2018-11-27 DIAGNOSIS — H53123 Transient visual loss, bilateral: Secondary | ICD-10-CM | POA: Diagnosis not present

## 2018-12-02 DIAGNOSIS — N898 Other specified noninflammatory disorders of vagina: Secondary | ICD-10-CM | POA: Diagnosis not present

## 2018-12-02 DIAGNOSIS — Z01419 Encounter for gynecological examination (general) (routine) without abnormal findings: Secondary | ICD-10-CM | POA: Diagnosis not present

## 2018-12-24 ENCOUNTER — Ambulatory Visit
Admission: RE | Admit: 2018-12-24 | Discharge: 2018-12-24 | Disposition: A | Discharge status: Home / Self Care | Payer: Medicare HMO | Source: Ambulatory Visit | Attending: Surgery | Admitting: Surgery

## 2018-12-24 ENCOUNTER — Ambulatory Visit: Payer: Medicare HMO | Admitting: Surgery

## 2018-12-24 ENCOUNTER — Other Ambulatory Visit: Payer: Self-pay

## 2018-12-24 VITALS — BP 130/86 | HR 62 | Temp 97.7°F | Resp 20 | Ht 66.0 in | Wt 201.0 lb

## 2018-12-24 DIAGNOSIS — I712 Thoracic aortic aneurysm, without rupture, unspecified: Secondary | ICD-10-CM

## 2018-12-24 MED ORDER — IOPAMIDOL (ISOVUE-370) INJECTION 76%
75.0000 mL | Freq: Once | INTRAVENOUS | Status: AC | PRN
  Administered 2018-12-24: 75 mL via INTRAVENOUS

## 2018-12-26 ENCOUNTER — Encounter: Payer: Self-pay | Admitting: Surgery

## 2018-12-26 NOTE — Progress Notes (Signed)
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 Review of her medical records showed that she had a CT scan of the chest for lung cancer screening on 02/23/2015 which showed the fusiform ascending aortic aneurysm to measure 4.5 cm at that time.  She denies any chest or back pain.  Current Outpatient Medications  Medication Sig Dispense Refill  . acetaminophen (TYLENOL) 650 MG CR tablet Take 1,300 mg by mouth 2 (two) times a day.     Marland Kitchen buPROPion (WELLBUTRIN SR) 150 MG 12 hr tablet Take 150 mg by mouth 2 (two) times daily.     . calcium-vitamin D (OSCAL WITH D) 250-125 MG-UNIT tablet Take 2 tablets by mouth daily.    . hydrochlorothiazide (HYDRODIURIL) 25 MG tablet Take 25 mg by mouth daily.     Marland Kitchen losartan (COZAAR) 100 MG tablet Take 100 mg by mouth daily.    . metoprolol succinate (TOPROL-XL) 25 MG 24 hr tablet Take 0.5 tablets (12.5 mg total) by mouth daily. 45 tablet 3  . Multiple Vitamins-Minerals (CENTRUM SILVER ADULT 50+) TABS Take 1 tablet by mouth daily.    . nitrofurantoin, macrocrystal-monohydrate, (MACROBID) 100 MG capsule Take 1 capsule (100 mg total) by mouth 2 (two) times daily. 9 capsule 0  . pantoprazole (PROTONIX) 40 MG tablet Take 1 tablet (40 mg total) by mouth daily. 90 tablet 3  . polyethylene glycol (MIRALAX / GLYCOLAX) packet Take 17 g by mouth daily. 14 each 0  . predniSONE (DELTASONE) 50 MG tablet Take 1 tablet (50 mg total) by mouth daily with breakfast. 5 tablet 0  . rivaroxaban (XARELTO) 20 MG TABS tablet Take 20 mg by mouth daily with supper.    . rosuvastatin (CRESTOR) 20 MG tablet TAKE 1 TABLET BY MOUTH EVERY DAY 90 tablet 3  . traMADol (ULTRAM) 50 MG tablet Take 1 tablet (50 mg total) by mouth every 6 (six) hours as needed for moderate pain. 30 tablet 0  . triamcinolone (KENALOG) 0.025 % cream  Apply 1 application topically daily as needed for itching.     No current facility-administered medications for this visit.      Physical Exam: BP 130/86   Pulse 62   Temp 97.7 F (36.5 C) (Skin)   Resp 20   Ht 5\' 6"  (1.676 m)   Wt 201 lb (91.2 kg)   SpO2 95% Comment: RA  BMI 32.44 kg/m  She looks well. Cardiac exam shows regular rate and rhythm with normal heart sounds.  There is no murmur. Lung exam is clear.  Diagnostic Tests:  CLINICAL DATA:  Thoracic aortic aneurysm, follow-up. Fatigue Cancer-none Hx of thyroidectomy  EXAM: CT ANGIOGRAPHY CHEST WITH CONTRAST  TECHNIQUE: Multidetector CT imaging of the chest was performed using the standard protocol during bolus administration of intravenous contrast. Multiplanar CT image reconstructions and MIPs were obtained to evaluate the vascular anatomy.  CONTRAST:  45mL ISOVUE-370 IOPAMIDOL (ISOVUE-370) INJECTION 76%  COMPARISON:  04/10/2018  FINDINGS: Cardiovascular: Heart size upper limits normal. Satisfactory opacification of pulmonary arteries noted, and there is no evidence of pulmonary emboli. good contrast opacification of the thoracic aorta. Maximum transverse dimensions as follows:  3.8 cm sinuses of Valsalva  3.4 cm sino-tubular junction  4.3 cm mid ascending (stable)  3.8 cm proximal arch  2.5 cm distal arch  3 cm  proximal descending  2.5 cm distal descending  No dissection or stenosis. Classic 3 vessel brachiocephalic arterial origin anatomy without proximal stenosis. Mild atheromatous change in the proximal abdominal aorta without aneurysm.  Mediastinum/Nodes: Small hiatal hernia. Stable right axillary and subpectoral adenopathy. No hilar or mediastinal adenopathy. Heterogenous right thyroid lobe extends retrosternal as before.  Lungs/Pleura: No pleural effusion. No pneumothorax. Scattered blebs in right middle and lower lobes. Lungs otherwise clear.  Upper Abdomen: No acute  abnormality.  Stable splenic hemangiomas.  Musculoskeletal: No chest wall abnormality. No acute or significant osseous findings.  Review of the MIP images confirms the above findings.  IMPRESSION: 1. Stable 4.3 cm ascending Aortic aneurysm NOS ([id number]) without complicating features. Recommend annual imaging followup by CTA or MRA. This recommendation follows 2010 ACCF/AHA/AATS/ACR/ASA/SCA/SCAI/SIR/STS/SVM Guidelines for the Diagnosis and Management of Patients with Thoracic Aortic Disease. Circulation. 2010; 121: [id number] 2. Stable right axillary and subpectoral adenopathy since 01/16/2016, favoring inflammatory/reactive process over neoplasm. 3. Aortic Atherosclerosis (ICD10-I70.0).   Electronically Signed   By: Lucrezia Europe M.D.   On: 12/24/2018 13:18  Impression:  She has a stable 4.3 cm fusiform ascending aortic aneurysm that was measured slightly smaller than last year.  This is still well below the surgical threshold of 5.5 cm.  I reviewed the CT images with her and answered her questions.  I stressed the importance of continued good blood pressure control and preventing further enlargement and acute aortic dissection.  Plan:  I will see her back in 1 year with a CTA of the chest.  I spent 15 minutes performing this established patient evaluation and > 50% of this time was spent face to face counseling and coordinating the care of this patient's aortic aneurysm.    Gaye Pollack, MD [facility] [phone]

## 2019-01-07 DIAGNOSIS — M1990 Unspecified osteoarthritis, unspecified site: Secondary | ICD-10-CM | POA: Diagnosis not present

## 2019-01-07 DIAGNOSIS — I10 Essential (primary) hypertension: Secondary | ICD-10-CM | POA: Diagnosis not present

## 2019-01-07 DIAGNOSIS — F419 Anxiety disorder, unspecified: Secondary | ICD-10-CM | POA: Diagnosis not present

## 2019-01-07 DIAGNOSIS — R32 Unspecified urinary incontinence: Secondary | ICD-10-CM | POA: Diagnosis not present

## 2019-01-07 DIAGNOSIS — K219 Gastro-esophageal reflux disease without esophagitis: Secondary | ICD-10-CM | POA: Diagnosis not present

## 2019-01-07 DIAGNOSIS — Z6833 Body mass index (BMI) 33.0-33.9, adult: Secondary | ICD-10-CM | POA: Diagnosis not present

## 2019-01-07 DIAGNOSIS — R69 Illness, unspecified: Secondary | ICD-10-CM | POA: Diagnosis not present

## 2019-01-07 DIAGNOSIS — E785 Hyperlipidemia, unspecified: Secondary | ICD-10-CM | POA: Diagnosis not present

## 2019-01-07 DIAGNOSIS — E669 Obesity, unspecified: Secondary | ICD-10-CM | POA: Diagnosis not present

## 2019-01-07 DIAGNOSIS — G4730 Sleep apnea, unspecified: Secondary | ICD-10-CM | POA: Diagnosis not present

## 2019-02-12 DIAGNOSIS — R69 Illness, unspecified: Secondary | ICD-10-CM | POA: Diagnosis not present

## 2019-02-12 DIAGNOSIS — F411 Generalized anxiety disorder: Secondary | ICD-10-CM | POA: Diagnosis not present

## 2019-02-12 DIAGNOSIS — F339 Major depressive disorder, recurrent, unspecified: Secondary | ICD-10-CM | POA: Diagnosis not present

## 2019-02-20 DIAGNOSIS — R309 Painful micturition, unspecified: Secondary | ICD-10-CM | POA: Diagnosis not present

## 2019-02-20 DIAGNOSIS — J40 Bronchitis, not specified as acute or chronic: Secondary | ICD-10-CM | POA: Diagnosis not present

## 2019-02-20 DIAGNOSIS — I719 Aortic aneurysm of unspecified site, without rupture: Secondary | ICD-10-CM | POA: Diagnosis not present

## 2019-02-20 DIAGNOSIS — M5432 Sciatica, left side: Secondary | ICD-10-CM | POA: Diagnosis not present

## 2019-03-05 NOTE — Progress Notes (Signed)
Cardiology Office Note   Date:  03/09/2019   ID:  Denise Cline, DOB 01-Aug-1940, MRN [id number]  PCP:  Josetta Huddle, MD  Cardiologist: Dr. Daneen Schick, MD  Chief Complaint  Patient presents with  . Follow-up    History of Present Illness: Denise Cline is a 78 y.o. female who presents for 6 to 21-monthfollow-up, seen for Dr. STamala Julian Denise Cline has a history of CAD identified by screening on CT scan found to have calcifications also with a sending thoracic aneurysm who is since been referred to Dr. BCyndia Bentto follow.  Also with a history of COPD, essential hypertension, PVD and hyperlipidemia.  She underwent a Myoview stress test 05/19/2015 which was found to have no significant reversible ischemia and an LVEF of 54%, considered a low risk study. Repeat stress test 05/2017 showed normal perfusion with a stable EF and no wall motion abnormalities, also considered a low risk study. He went an echocardiogram 05/2017 which showed an LVEF of 65- 70% with G1 DD and NWMA.  Ascending aorta was measured at 4.2 cm at that time.  She was last seen by Dr. STamala Julian6/03/2018 at which time she was doing well from a CV standpoint however did have some complaints of left leg edema thought to be chronic venous insufficiency.   Recently seen by Dr. BCyndia Bent10/21/2020 for follow-up of thoracic aortic aneurysm found on CT scan of the chest 10/2017 during work-up for gallstone pancreatitis. Measurement at that time was 4.3 cm, noted to be slightly smaller than previous imaging and below surgical threshold of 5.5 cm.  Plan was to continue with good blood pressure control and follow-up in 1 year for reassessment with CTA.  Today she reports that she has been doing well from a CV standpoint. She she continues to have left lower extremity pain with walking, however in discussion, appears that the pain is coming from her sciatica.  She states that the pain is exacerbated with walking and feels like a shooting pain  from the back of her hip down to her left foot with mild tingling.  She underwent vascular ultrasound with ABI 05/16/2017 with no evidence of PVD.  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  Underwent left lower extremity venous ultrasound 08/14/2018 without evidence of DVT.  She reports the swelling is stable at this time.  She is on multiple medications for osteoarthritis and long discussion about continued physical activity to keep her joints and motion as well as her Tylenol twice daily would be best for now.  She does not have a history of diabetes however I do not see a more recent hemoglobin A1c in epic therefore will obtain today.   Past Medical History:  Diagnosis Date  . Anxiety   . COPD [place] disease) [place]   . D[place] ([place] thrombosis) ([facility] 2014, [place]  . Fibroid   . History of shingles   . Hypertension   . PE (pulmonary thromboembolism) [place] [place]  . Thyroid disease    Nodules  . Varicose veins     Past Surgical History:  Procedure Laterality Date  . ABDOMINAL HYSTERECTOMY    . CHOLECYSTECTOMY N/A 11/05/2017   Procedure: LAPAROSCOPIC CHOLECYSTECTOMY WITH INTRAOPERATIVE CHOLANGIOGRAM;  Surgeon: TJovita Kussmaul MD;  [facility]  Service: General;  Laterality: N/A;  . COLONOSCOPY N/A 10/05/2014   Procedure: COLONOSCOPY;  Surgeon: RRonald Lobo  MD;  [facility];  Service: Endoscopy;  Laterality: N/A;  . ERCP N/A 11/04/2017   Procedure: ENDOSCOPIC RETROGRADE CHOLANGIOPANCREATOGRAPHY (ERCP) Balloon Extraction;  Surgeon: Ronnette Juniper, MD;  Location: [facility];  Service: Gastroenterology;  Laterality: N/A;  . LAPAROSCOPIC CHOLECYSTECTOMY W/ CHOLANGIOGRAPHY  11/05/2017   Dr. Marlou Starks  . OOPHORECTOMY     One ovary removed  . SPHINCTEROTOMY  11/04/2017   Procedure: SPHINCTEROTOMY;  Surgeon: Ronnette Juniper, MD;  Location: [facility];  Service: Gastroenterology;;  . TOTAL THYROIDECTOMY        Current Outpatient Medications  Medication Sig Dispense Refill  . acetaminophen (TYLENOL) 650 MG CR tablet Take 1,300 mg by mouth 2 (two) times a day.     Marland Kitchen buPROPion (WELLBUTRIN SR) 150 MG 12 hr tablet Take 150 mg by mouth 2 (two) times daily.     . calcium-vitamin D (OSCAL WITH D) 250-125 MG-UNIT tablet Take 2 tablets by mouth daily.    . hydrochlorothiazide (HYDRODIURIL) 25 MG tablet Take 25 mg by mouth daily.     Marland Kitchen losartan (COZAAR) 100 MG tablet Take 100 mg by mouth daily.    . metoprolol succinate (TOPROL-XL) 25 MG 24 hr tablet Take 0.5 tablets (12.5 mg total) by mouth daily. 45 tablet 3  . Multiple Vitamins-Minerals (CENTRUM SILVER ADULT 50+) TABS Take 1 tablet by mouth daily.    . nitrofurantoin, macrocrystal-monohydrate, (MACROBID) 100 MG capsule Take 1 capsule (100 mg total) by mouth 2 (two) times daily. 9 capsule 0  . pantoprazole (PROTONIX) 40 MG tablet Take 1 tablet (40 mg total) by mouth daily. 90 tablet 3  . polyethylene glycol (MIRALAX / GLYCOLAX) packet Take 17 g by mouth daily. 14 each 0  . predniSONE (DELTASONE) 50 MG tablet Take 1 tablet (50 mg total) by mouth daily with breakfast. 5 tablet 0  . rivaroxaban (XARELTO) 20 MG TABS tablet Take 20 mg by mouth daily with supper.    . rosuvastatin (CRESTOR) 20 MG tablet TAKE 1 TABLET BY MOUTH EVERY DAY 90 tablet 3  . traMADol (ULTRAM) 50 MG tablet Take 1 tablet (50 mg total) by mouth every 6 (six) hours as needed for moderate pain. 30 tablet 0  . triamcinolone (KENALOG) 0.025 % cream Apply 1 application topically daily as needed for itching.     No current facility-administered medications for this visit.    Allergies:   Penicillins, Sulfa antibiotics, Citrus, Oxycodone, and Vicodin [hydrocodone-acetaminophen]    Social History:  The patient  reports that she quit smoking about 10 years ago. Her smoking use included cigarettes. She has a 80.00 pack-year smoking history. She quit smokeless tobacco use about 10 years ago.  She reports that she does not drink alcohol or use drugs.   Family History:  The patient's family history includes Crohn's disease in her mother; Diabetes in her sister; Hypertension in her brother and sister; Stroke in her father.    ROS:  Please see the history of present illness. Otherwise, review of systems are positive for none.   All other systems are reviewed and negative.    PHYSICAL EXAM: VS:  There were no vitals taken for this visit. , BMI There is no height or weight on file to calculate BMI.   General: Well developed, well nourished, NAD Neck: Negative for carotid bruits. No JVD Lungs:Clear to ausculation bilaterally. No wheezes, rales, or rhonchi. Breathing is unlabored. Cardiovascular: RRR with S1 S2. No murmur Extremities: Left> right however no pitting edema. DP/PT pulses 1+ bilaterally Neuro: Alert  and oriented. No focal deficits. No facial asymmetry. MAE spontaneously. Psych: Responds to questions appropriately with normal affect.     EKG:  EKG is not ordered today.   Recent Labs: 07/29/2018: ALT 16; BUN 22; Creatinine, Ser 1.06; Hemoglobin 12.6; Platelets 254; Potassium 4.1; Sodium 141    Lipid Panel    Component Value Date/Time   CHOL 204 (H) 12/25/2017 0816   TRIG 96 12/25/2017 0816   HDL 60 12/25/2017 0816   CHOLHDL 3.4 12/25/2017 0816   CHOLHDL 3.7 05/15/2009 0555   VLDL 22 05/15/2009 0555   LDLCALC 125 (H) 12/25/2017 0816      Wt Readings from Last 3 Encounters:  12/24/18 201 lb (91.2 kg)  08/04/18 211 lb (95.7 kg)  07/29/18 200 lb 9.9 oz (91 kg)     Other studies Reviewed: Additional studies/ records that were reviewed today include:   Echocardiogram 05/22/2017:  Study Conclusions  - Left ventricle: The cavity size was normal. Wall thickness was   increased in a pattern of mild LVH. Systolic function was   vigorous. The estimated ejection fraction was in the range of 65%   to 70%. Wall motion was normal; there were no regional wall    motion abnormalities. Doppler parameters are consistent with   abnormal left ventricular relaxation (grade 1 diastolic   dysfunction). - Aortic valve: There was no stenosis. There was trivial   regurgitation. - Aorta: Mildly dilated aortic root and ascending aorta. Aortic   root diameter: 40 mm. Ascending aortic diameter: 42 mm (S). - Mitral valve: There was trivial regurgitation. - Right ventricle: The cavity size was normal. Systolic function   was normal. - Tricuspid valve: Peak RV-RA gradient (S): 29 mm Hg. - Pulmonary arteries: PA peak pressure: 32 mm Hg (S). - Inferior vena cava: The vessel was normal in size. The   respirophasic diameter changes were in the normal range (>= 50%),   consistent with normal central venous pressure.  Impressions:  - Normal LV size with mild LV hypertrophy. EF 65-70%. Normal RV   size and systolic function. No significant valvular   abnormalities. Mildly dilated aortic root and ascending aorta.  Myoview stress test 05/16/2017:   The left ventricular ejection fraction is mildly decreased (45-54%).  Nuclear stress EF: 52%.  No T wave inversion was noted during stress.  There was no ST segment deviation noted during stress.  The study is normal.  This is a low risk study.   Normal perfusion. LVEF 52% with normal wall motion. This is a low risk study.  ASSESSMENT AND PLAN:  1.  Coronary calcification seen on CT scan: -Denies anginal symptoms -Continue with current regimen  -No ASA in the setting of chronic Xarelto  2.  Hyperlipidemia: -Last LDL, 125 on 12/25/2017 -LDL goal less than 100 -Continue statin -Obtain lipid panel, LFTs today  3.  Thoracic aortic aneurysm without rupture: -Found incidentally during a work-up for gallstone pancreatitis, noted to be 4.3 cm.  Followed by Dr. Cyndia Bent, last seen 12/24/2018 with recommendations to continue with good BP control and follow-up in 1 year for serial CTA imaging -BP stable today at  112/70   4.  Essential hypertension: -Stable, 112/70 -Continue current regimen with no changes   Current medicines are reviewed at length with the patient today.  The patient does not have concerns regarding medicines.  The following changes have been made:  no change  Labs/ tests ordered today include: C-Met, lipid panel (nonfasting) No orders of the defined  types were placed in this encounter.    Disposition:   FU with Dr. Tamala Julian in 6 months  Signed, Kathyrn Drown, NP  03/09/2019 1:49 PM    [address], [place], [place]  [address] Phone: [phone]; Fax: [phone]

## 2019-03-09 ENCOUNTER — Ambulatory Visit: Payer: Medicare HMO | Admitting: Cardiology

## 2019-03-09 ENCOUNTER — Encounter: Payer: Self-pay | Admitting: Cardiology

## 2019-03-09 ENCOUNTER — Other Ambulatory Visit: Payer: Self-pay

## 2019-03-09 VITALS — BP 112/70 | HR 62 | Ht 66.0 in | Wt 211.0 lb

## 2019-03-09 DIAGNOSIS — I2699 Other pulmonary embolism without acute cor pulmonale: Secondary | ICD-10-CM

## 2019-03-09 DIAGNOSIS — I712 Thoracic aortic aneurysm, without rupture, unspecified: Secondary | ICD-10-CM

## 2019-03-09 DIAGNOSIS — G629 Polyneuropathy, unspecified: Secondary | ICD-10-CM

## 2019-03-09 DIAGNOSIS — I2510 Atherosclerotic heart disease of native coronary artery without angina pectoris: Secondary | ICD-10-CM

## 2019-03-09 DIAGNOSIS — Z79899 Other long term (current) drug therapy: Secondary | ICD-10-CM | POA: Diagnosis not present

## 2019-03-09 DIAGNOSIS — I10 Essential (primary) hypertension: Secondary | ICD-10-CM

## 2019-03-09 DIAGNOSIS — I82412 Acute embolism and thrombosis of left femoral vein: Secondary | ICD-10-CM | POA: Diagnosis not present

## 2019-03-09 NOTE — Patient Instructions (Signed)
Medication Instructions:   Your physician recommends that you continue on your current medications as directed. Please refer to the Current Medication list given to you today.  *If you need a refill on your cardiac medications before your next appointment, please call your pharmacy*  Lab Work:  You will have labs drawn today: CMET and non fasting A1c  If you have labs (blood work) drawn today and your tests are completely normal, you will receive your results only by: Marland Kitchen MyChart Message (if you have MyChart) OR . A paper copy in the mail If you have any lab test that is abnormal or we need to change your treatment, we will call you to review the results.  Testing/Procedures:  None ordered today  Follow-Up: At [facility], you and your health needs are our priority.  As part of our continuing mission to provide you with exceptional heart care, we have created designated Provider Care Teams.  These Care Teams include your primary Cardiologist (physician) and Advanced Practice Providers (APPs -  Physician Assistants and Nurse Practitioners) who all work together to provide you with the care you need, when you need it.  Your next appointment:   9 month(s)  The format for your next appointment:   In Person  Provider:   Daneen Schick, MD

## 2019-03-10 LAB — COMPREHENSIVE METABOLIC PANEL
ALT: 11 IU/L (ref 0–32)
AST: 16 IU/L (ref 0–40)
Albumin/Globulin Ratio: 1.4 (ref 1.2–2.2)
Albumin: 3.9 g/dL (ref 3.7–4.7)
Alkaline Phosphatase: 51 IU/L (ref 39–117)
BUN/Creatinine Ratio: 26 (ref 12–28)
BUN: 24 mg/dL (ref 8–27)
Bilirubin Total: 0.3 mg/dL (ref 0.0–1.2)
CO2: 26 mmol/L (ref 20–29)
Calcium: 9.6 mg/dL (ref 8.7–10.3)
Chloride: 102 mmol/L (ref 96–106)
Creatinine, Ser: 0.91 mg/dL (ref 0.57–1.00)
GFR calc Af Amer: 69 mL/min/{1.73_m2} (ref >59)
GFR calc non Af Amer: 60 mL/min/{1.73_m2} (ref >59)
Globulin, Total: 2.8 g/dL (ref 1.5–4.5)
Glucose: 84 mg/dL (ref 65–99)
Potassium: 4.4 mmol/L (ref 3.5–5.2)
Sodium: 140 mmol/L (ref 134–144)
Total Protein: 6.7 g/dL (ref 6.0–8.5)

## 2019-03-10 LAB — HEMOGLOBIN A1C
Est. average glucose Bld gHb Est-mCnc: 126 mg/dL
Hgb A1c MFr Bld: 6 % — ABNORMAL HIGH (ref 4.8–5.6)

## 2019-03-30 DIAGNOSIS — R69 Illness, unspecified: Secondary | ICD-10-CM | POA: Diagnosis not present

## 2019-04-16 DIAGNOSIS — E039 Hypothyroidism, unspecified: Secondary | ICD-10-CM | POA: Diagnosis not present

## 2019-04-16 DIAGNOSIS — Z0000 Encounter for general adult medical examination without abnormal findings: Secondary | ICD-10-CM | POA: Diagnosis not present

## 2019-04-16 DIAGNOSIS — Z79899 Other long term (current) drug therapy: Secondary | ICD-10-CM | POA: Diagnosis not present

## 2019-04-16 DIAGNOSIS — I10 Essential (primary) hypertension: Secondary | ICD-10-CM | POA: Diagnosis not present

## 2019-04-16 DIAGNOSIS — I2510 Atherosclerotic heart disease of native coronary artery without angina pectoris: Secondary | ICD-10-CM | POA: Diagnosis not present

## 2019-04-16 DIAGNOSIS — Z86718 Personal history of other venous thrombosis and embolism: Secondary | ICD-10-CM | POA: Diagnosis not present

## 2019-04-16 DIAGNOSIS — E559 Vitamin D deficiency, unspecified: Secondary | ICD-10-CM | POA: Diagnosis not present

## 2019-04-16 DIAGNOSIS — G629 Polyneuropathy, unspecified: Secondary | ICD-10-CM | POA: Diagnosis not present

## 2019-04-16 DIAGNOSIS — R69 Illness, unspecified: Secondary | ICD-10-CM | POA: Diagnosis not present

## 2019-04-16 DIAGNOSIS — G4733 Obstructive sleep apnea (adult) (pediatric): Secondary | ICD-10-CM | POA: Diagnosis not present

## 2019-04-21 DIAGNOSIS — H547 Unspecified visual loss: Secondary | ICD-10-CM | POA: Diagnosis not present

## 2019-05-02 DIAGNOSIS — E039 Hypothyroidism, unspecified: Secondary | ICD-10-CM | POA: Diagnosis not present

## 2019-05-02 DIAGNOSIS — I2510 Atherosclerotic heart disease of native coronary artery without angina pectoris: Secondary | ICD-10-CM | POA: Diagnosis not present

## 2019-05-02 DIAGNOSIS — I10 Essential (primary) hypertension: Secondary | ICD-10-CM | POA: Diagnosis not present

## 2019-05-02 DIAGNOSIS — J4521 Mild intermittent asthma with (acute) exacerbation: Secondary | ICD-10-CM | POA: Diagnosis not present

## 2019-05-02 DIAGNOSIS — M179 Osteoarthritis of knee, unspecified: Secondary | ICD-10-CM | POA: Diagnosis not present

## 2019-05-02 DIAGNOSIS — J449 Chronic obstructive pulmonary disease, unspecified: Secondary | ICD-10-CM | POA: Diagnosis not present

## 2019-05-02 DIAGNOSIS — M169 Osteoarthritis of hip, unspecified: Secondary | ICD-10-CM | POA: Diagnosis not present

## 2019-05-02 DIAGNOSIS — M1990 Unspecified osteoarthritis, unspecified site: Secondary | ICD-10-CM | POA: Diagnosis not present

## 2019-05-02 DIAGNOSIS — R69 Illness, unspecified: Secondary | ICD-10-CM | POA: Diagnosis not present

## 2019-05-02 DIAGNOSIS — E782 Mixed hyperlipidemia: Secondary | ICD-10-CM | POA: Diagnosis not present

## 2019-05-06 DIAGNOSIS — I6523 Occlusion and stenosis of bilateral carotid arteries: Secondary | ICD-10-CM | POA: Diagnosis not present

## 2019-05-06 DIAGNOSIS — H547 Unspecified visual loss: Secondary | ICD-10-CM | POA: Diagnosis not present

## 2019-05-07 ENCOUNTER — Encounter: Payer: Self-pay | Admitting: Emergency Medicine

## 2019-05-07 ENCOUNTER — Other Ambulatory Visit: Payer: Self-pay

## 2019-05-07 ENCOUNTER — Ambulatory Visit
Admission: EM | Admit: 2019-05-07 | Discharge: 2019-05-07 | Disposition: A | Discharge status: Home / Self Care | Payer: Medicare HMO | Attending: Emergency Medicine | Admitting: Emergency Medicine

## 2019-05-07 DIAGNOSIS — M25512 Pain in left shoulder: Secondary | ICD-10-CM | POA: Diagnosis not present

## 2019-05-07 MED ORDER — METHYLPREDNISOLONE SODIUM SUCC 125 MG IJ SOLR
80.0000 mg | Freq: Once | INTRAMUSCULAR | Status: AC
  Administered 2019-05-07: 80 mg via INTRAMUSCULAR

## 2019-05-07 NOTE — Discharge Instructions (Addendum)
You are given steroid shot today for pain. Very important to call Dr. Sherle Poe office to schedule follow-up appointment. Return for worsening pain, numbness or tingling in your hand, weakness. Go to ER if you develop difficulty speaking, confusion, chest pain or difficulty breathing, facial droop.

## 2019-05-07 NOTE — ED Provider Notes (Signed)
[name]    CSN: [id number] Arrival date & time: 05/07/19  1544      History   Chief Complaint Chief Complaint  Patient presents with  . Shoulder Pain    HPI Denise Cline is a 79 y.o. female with history of DVT, PE, hypertension, COPD presenting for 3-day course of acute on chronic left shoulder pain.  Pain to radiate up neck and is worse with certain movements.  Denies recent fall or trauma to the area.  Patient endorsing history of rotator cuff injury: Denies history of surgical intervention.  Has not seen orthopedist in a few years.  States she woke up a few days ago to it: Unsure if she "slept weird ".  Denies upper extremity paresthesia, weakness, numbness.  No facial droop, slurred speech, chest pain, difficulty breathing.   Past Medical History:  Diagnosis Date  . Anxiety   . COPD (chronic obstructive pulmonary disease) ([place])   . DVT (deep venous thrombosis) ([place]) 2014, 2017  . Fibroid   . History of shingles   . Hypertension   . PE (pulmonary thromboembolism) ([place]) 2017  . Thyroid disease    Nodules  . Varicose veins     Patient Active Problem List   Diagnosis Date Noted  . Choledocholithiasis with acute cholecystitis with obstruction 11/02/2017  . Chronic kidney disease, stage 3 11/02/2017  . DVT (deep venous thrombosis) ([place]) 11/12/2015  . Pulmonary emboli ([place]) 11/11/2015  . Acute pulmonary embolism ([place]) 11/11/2015  . Coronary artery calcification seen on CAT scan 05/12/2015  . Thoracic aortic aneurysm ([place]) 05/12/2015  . Dyspnea 03/17/2015  . Left leg swelling 03/16/2015  . Acute colitis 10/04/2014  . COPD (chronic obstructive pulmonary disease) ([place])   . PVD (peripheral vascular disease) ([place]) 04/01/2012  . Leg pain 04/01/2012  . Swelling of limb 04/01/2012  . History of shingles   . Hypertension   . Fibroid   . Thyroid disease   . Anxiety     Past Surgical History:  Procedure Laterality Date  . ABDOMINAL HYSTERECTOMY    .  CHOLECYSTECTOMY N/A 11/05/2017   Procedure: LAPAROSCOPIC CHOLECYSTECTOMY WITH INTRAOPERATIVE CHOLANGIOGRAM;  Surgeon: Jovita Kussmaul, MD;  Location: [facility];  Service: General;  Laterality: N/A;  . COLONOSCOPY N/A 10/05/2014   Procedure: COLONOSCOPY;  Surgeon: Ronald Lobo, MD;  [facility];  Service: Endoscopy;  Laterality: N/A;  . ERCP N/A 11/04/2017   Procedure: ENDOSCOPIC RETROGRADE CHOLANGIOPANCREATOGRAPHY (ERCP) Balloon Extraction;  Surgeon: Ronnette Juniper, MD;  Location: [facility];  Service: Gastroenterology;  Laterality: N/A;  . LAPAROSCOPIC CHOLECYSTECTOMY W/ CHOLANGIOGRAPHY  11/05/2017   Dr. Marlou Starks  . OOPHORECTOMY     One ovary removed  . SPHINCTEROTOMY  11/04/2017   Procedure: SPHINCTEROTOMY;  Surgeon: Ronnette Juniper, MD;  Location: [facility] ENDOSCOPY;  Service: Gastroenterology;;  . TOTAL THYROIDECTOMY      OB History    Gravida  3   Para  3   Term  3   Preterm      AB      Living  3     SAB      TAB      Ectopic      Multiple      Live Births               Home Medications    Prior to Admission medications   Medication Sig Start Date End Date Taking? Authorizing Provider  acetaminophen (TYLENOL) 650 MG CR tablet Take 1,300 mg by  mouth 2 (two) times a day.     [provider]  buPROPion (WELLBUTRIN SR) 150 MG 12 hr tablet Take 150 mg by mouth 2 (two) times daily.     [provider]  calcium-vitamin D (OSCAL WITH D) 250-125 MG-UNIT tablet Take 2 tablets by mouth daily.    [provider]  hydrochlorothiazide (HYDRODIURIL) 25 MG tablet Take 25 mg by mouth daily.  02/05/18   [provider]  losartan (COZAAR) 100 MG tablet Take 100 mg by mouth daily. 02/05/18   [provider]  metoprolol succinate (TOPROL-XL) 25 MG 24 hr tablet Take 0.5 tablets (12.5 mg total) by mouth daily. 04/14/18 04/09/19  Belva Crome, MD  Multiple Vitamins-Minerals (CENTRUM SILVER ADULT 50+) TABS Take 1 tablet by mouth daily.    [provider]  pantoprazole (PROTONIX) 40 MG tablet Take 1 tablet (40 mg total) by mouth daily. 10/21/17   Belva Crome, MD  polyethylene glycol [facility] / Floria Raveling) packet Take 17 g by mouth daily. 11/07/17   Rayburn, Floyce Stakes, PA-C  rivaroxaban (XARELTO) 20 MG TABS tablet Take 20 mg by mouth daily with supper.    [provider]  rosuvastatin (CRESTOR) 20 MG tablet TAKE 1 TABLET BY MOUTH EVERY DAY 11/11/18   Belva Crome, MD  sertraline (ZOLOFT) 50 MG tablet TAKE 1/2 TABLET BY MOUTH ONCE A DAY WITH MEALS FOR 7 DAYS, THEN 1 TAB DAILY 02/12/19   [provider]  traMADol (ULTRAM) 50 MG tablet Take 1 tablet (50 mg total) by mouth every 6 (six) hours as needed for moderate pain. 08/23/15   Mesner, Corene Cornea, MD  traZODone (DESYREL) 50 MG tablet Take 25-50 mg by mouth at bedtime as needed. 02/12/19   [provider]  triamcinolone (KENALOG) 0.025 % cream Apply 1 application topically daily as needed for itching. 03/29/17   [provider]    Family History Family History  Problem Relation Age of Onset  . Crohn's disease Mother   . Stroke Father   . Diabetes Sister   . Hypertension Sister   . Hypertension Brother     Social History Social History   Tobacco Use  . Smoking status: Former Smoker    Packs/day: 2.00    Years: 40.00    Pack years: 80.00    Types: Cigarettes    Quit date: 05/30/2008    Years since quitting: 10.9  . Smokeless tobacco: Former Systems developer    Quit date: 05/12/2008  . Tobacco comment: Counseled to remain smoke free  Substance Use Topics  . Alcohol use: No    Alcohol/week: 0.0 standard drinks    Comment: Rare  . Drug use: No     Allergies   Penicillins, Sulfa antibiotics, Citrus, Oxycodone, and Vicodin [hydrocodone-acetaminophen]   Review of Systems As per HPI   Physical Exam Triage Vital Signs ED Triage Vitals [05/07/19 1600]  Enc Vitals Group     BP 133/80     Pulse Rate 68     Resp 18     Temp 97.8 F (36.6 C)      Temp Source Temporal     SpO2 97 %     Weight      Height      Head Circumference      Peak Flow      Pain Score 6     Pain Loc      Pain Edu?      Excl. in [place]?  No data found.  Updated Vital Signs BP 133/80 (BP Location: Left Arm)   Pulse 68   Temp 97.8 F (36.6 C) (Temporal)   Resp 18   SpO2 97%   Visual Acuity Right Eye Distance:   Left Eye Distance:   Bilateral Distance:    Right Eye Near:   Left Eye Near:    Bilateral Near:     Physical Exam Constitutional:      General: She is not in acute distress. HENT:     Head: Normocephalic and atraumatic.  Eyes:     General: No scleral icterus.    Pupils: Pupils are equal, round, and reactive to light.  Neck:     Comments: Trachea midline, negative JVD.  Positive neck arch tenderness on left side Cardiovascular:     Rate and Rhythm: Normal rate.  Pulmonary:     Effort: Pulmonary effort is normal. No respiratory distress.     Breath sounds: No wheezing.  Musculoskeletal:        General: Tenderness present. No swelling or deformity. Normal range of motion.     Cervical back: Normal range of motion. Tenderness present.     Comments: Full active ROM of upper extremities bilaterally.  No crepitus with shoulder.  Negative impingement.  Strength and neurovascularly intact.  Patient does have superior trapezius tenderness on left side that spares scapular spine, spinous process.  No clavicular or AC joint tenderness.  Skin:    Coloration: Skin is not jaundiced or pale.  Neurological:     Mental Status: She is alert and oriented to person, place, and time.      UC Treatments / Results  Labs (all labs ordered are listed, but only abnormal results are displayed) Labs Reviewed - No data to display  EKG   Radiology No results found.  Procedures Procedures (including critical care time)  Medications Ordered in UC Medications  methylPREDNISolone sodium succinate (SOLU-MEDROL) 125 mg/2 mL injection 80 mg (80 mg  Intramuscular Given 05/07/19 1633)    Initial Impression / Assessment and Plan / UC Course  I have reviewed the triage vital signs and the nursing notes.  Pertinent labs & imaging results that were available during my care of the patient were reviewed by me and considered in my medical decision making (see chart for details).     Patient appears well in office today.  No fall or trauma to the area: Radiography deferred.  Tenderness reproduced more over superior aspect of left trapezius.  Spares bony prominences and negative impingement: Low concern for rotator cuff injury at this time, though given history will have patient follow-up with Ortho for further evaluation/management if symptoms persist or get worse.  I am Solu-Medrol given in office which patient tolerated well.  Return precautions discussed, patient verbalized understanding and is agreeable to plan. Final Clinical Impressions(s) / UC Diagnoses   Final diagnoses:  Acute pain of left shoulder     Discharge Instructions     You are given steroid shot today for pain. Very important to call Dr. Sherle Poe office to schedule follow-up appointment. Return for worsening pain, numbness or tingling in your hand, weakness. Go to ER if you develop difficulty speaking, confusion, chest pain or difficulty breathing, facial droop.    ED Prescriptions    None     PDMP not reviewed this encounter.   [place], [place], [place] 05/07/19 1638

## 2019-05-07 NOTE — ED Notes (Signed)
Patient able to ambulate independently  

## 2019-05-07 NOTE — ED Triage Notes (Signed)
Pt presents to [facility] fro assessment of 3 days of left neck/shoulder pain.  States hx of "rotator cuff".  Patient states she woke up to it a few days ago.

## 2019-05-11 DIAGNOSIS — R69 Illness, unspecified: Secondary | ICD-10-CM | POA: Diagnosis not present

## 2019-05-11 DIAGNOSIS — J449 Chronic obstructive pulmonary disease, unspecified: Secondary | ICD-10-CM | POA: Diagnosis not present

## 2019-05-12 DIAGNOSIS — Z20828 Contact with and (suspected) exposure to other viral communicable diseases: Secondary | ICD-10-CM | POA: Diagnosis not present

## 2019-05-18 DIAGNOSIS — R69 Illness, unspecified: Secondary | ICD-10-CM | POA: Diagnosis not present

## 2019-05-18 DIAGNOSIS — M25512 Pain in left shoulder: Secondary | ICD-10-CM | POA: Diagnosis not present

## 2019-05-20 ENCOUNTER — Other Ambulatory Visit: Payer: Self-pay | Admitting: Interventional Cardiology

## 2019-06-03 DIAGNOSIS — E039 Hypothyroidism, unspecified: Secondary | ICD-10-CM | POA: Diagnosis not present

## 2019-06-03 DIAGNOSIS — J4521 Mild intermittent asthma with (acute) exacerbation: Secondary | ICD-10-CM | POA: Diagnosis not present

## 2019-06-03 DIAGNOSIS — E782 Mixed hyperlipidemia: Secondary | ICD-10-CM | POA: Diagnosis not present

## 2019-06-03 DIAGNOSIS — I2510 Atherosclerotic heart disease of native coronary artery without angina pectoris: Secondary | ICD-10-CM | POA: Diagnosis not present

## 2019-06-03 DIAGNOSIS — I10 Essential (primary) hypertension: Secondary | ICD-10-CM | POA: Diagnosis not present

## 2019-06-03 DIAGNOSIS — R69 Illness, unspecified: Secondary | ICD-10-CM | POA: Diagnosis not present

## 2019-06-03 DIAGNOSIS — M169 Osteoarthritis of hip, unspecified: Secondary | ICD-10-CM | POA: Diagnosis not present

## 2019-06-03 DIAGNOSIS — M1990 Unspecified osteoarthritis, unspecified site: Secondary | ICD-10-CM | POA: Diagnosis not present

## 2019-06-03 DIAGNOSIS — J449 Chronic obstructive pulmonary disease, unspecified: Secondary | ICD-10-CM | POA: Diagnosis not present

## 2019-06-03 DIAGNOSIS — M179 Osteoarthritis of knee, unspecified: Secondary | ICD-10-CM | POA: Diagnosis not present

## 2019-06-30 DIAGNOSIS — Z23 Encounter for immunization: Secondary | ICD-10-CM | POA: Diagnosis not present

## 2019-07-03 DIAGNOSIS — M179 Osteoarthritis of knee, unspecified: Secondary | ICD-10-CM | POA: Diagnosis not present

## 2019-07-03 DIAGNOSIS — I10 Essential (primary) hypertension: Secondary | ICD-10-CM | POA: Diagnosis not present

## 2019-07-03 DIAGNOSIS — E039 Hypothyroidism, unspecified: Secondary | ICD-10-CM | POA: Diagnosis not present

## 2019-07-03 DIAGNOSIS — J4521 Mild intermittent asthma with (acute) exacerbation: Secondary | ICD-10-CM | POA: Diagnosis not present

## 2019-07-03 DIAGNOSIS — J449 Chronic obstructive pulmonary disease, unspecified: Secondary | ICD-10-CM | POA: Diagnosis not present

## 2019-07-03 DIAGNOSIS — I2510 Atherosclerotic heart disease of native coronary artery without angina pectoris: Secondary | ICD-10-CM | POA: Diagnosis not present

## 2019-07-03 DIAGNOSIS — M1990 Unspecified osteoarthritis, unspecified site: Secondary | ICD-10-CM | POA: Diagnosis not present

## 2019-07-03 DIAGNOSIS — E782 Mixed hyperlipidemia: Secondary | ICD-10-CM | POA: Diagnosis not present

## 2019-07-03 DIAGNOSIS — M169 Osteoarthritis of hip, unspecified: Secondary | ICD-10-CM | POA: Diagnosis not present

## 2019-07-03 DIAGNOSIS — R69 Illness, unspecified: Secondary | ICD-10-CM | POA: Diagnosis not present

## 2019-07-22 DIAGNOSIS — Z23 Encounter for immunization: Secondary | ICD-10-CM | POA: Diagnosis not present

## 2019-07-24 DIAGNOSIS — G4733 Obstructive sleep apnea (adult) (pediatric): Secondary | ICD-10-CM | POA: Diagnosis not present

## 2019-07-29 DIAGNOSIS — J4521 Mild intermittent asthma with (acute) exacerbation: Secondary | ICD-10-CM | POA: Diagnosis not present

## 2019-07-29 DIAGNOSIS — I2510 Atherosclerotic heart disease of native coronary artery without angina pectoris: Secondary | ICD-10-CM | POA: Diagnosis not present

## 2019-07-29 DIAGNOSIS — M169 Osteoarthritis of hip, unspecified: Secondary | ICD-10-CM | POA: Diagnosis not present

## 2019-07-29 DIAGNOSIS — R69 Illness, unspecified: Secondary | ICD-10-CM | POA: Diagnosis not present

## 2019-07-29 DIAGNOSIS — J449 Chronic obstructive pulmonary disease, unspecified: Secondary | ICD-10-CM | POA: Diagnosis not present

## 2019-07-29 DIAGNOSIS — M179 Osteoarthritis of knee, unspecified: Secondary | ICD-10-CM | POA: Diagnosis not present

## 2019-07-29 DIAGNOSIS — I10 Essential (primary) hypertension: Secondary | ICD-10-CM | POA: Diagnosis not present

## 2019-07-29 DIAGNOSIS — M1990 Unspecified osteoarthritis, unspecified site: Secondary | ICD-10-CM | POA: Diagnosis not present

## 2019-07-29 DIAGNOSIS — E039 Hypothyroidism, unspecified: Secondary | ICD-10-CM | POA: Diagnosis not present

## 2019-07-29 DIAGNOSIS — E782 Mixed hyperlipidemia: Secondary | ICD-10-CM | POA: Diagnosis not present

## 2019-08-06 IMAGING — MR MR 3D RECON AT SCANNER
14 of 23 series · 21 of 48 positions shown · IV contrast (multihance)
Comparison: CT 11/02/2017

CLINICAL DATA: Pancreatitis acute pancreatitis.

EXAM:
MRI ABDOMEN WITHOUT AND WITH CONTRAST (INCLUDING MRCP)
TECHNIQUE: Multiplanar multisequence MR imaging of the abdomen was performed
both before and after the administration of intravenous contrast.
Heavily T2-weighted images of the biliary and pancreatic ducts were
obtained, and three-dimensional MRCP images were rendered by post
processing.
CONTRAST:  20mL MULTIHANCE GADOBENATE DIMEGLUMINE 529 MG/ML IV SOLN

[Series 5: cor ssfse nav · coronal · 6.0mm · 0.74mm/px · 1 of 32 slices shown]
[im 1/32]
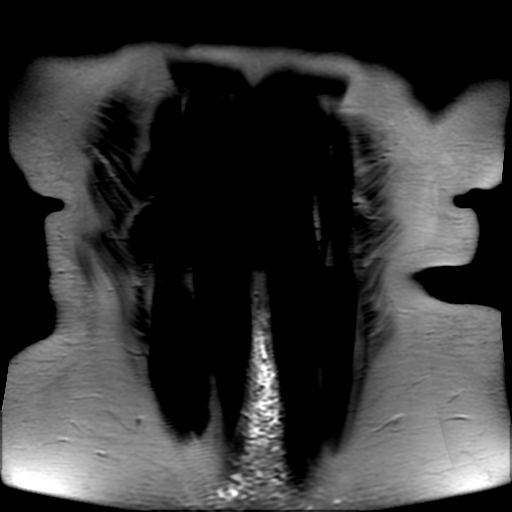

[Series 7: ax ssfse nav · axial · 6.0mm · 0.74mm/px · 1 of 42 slices shown]
[im 1/42]
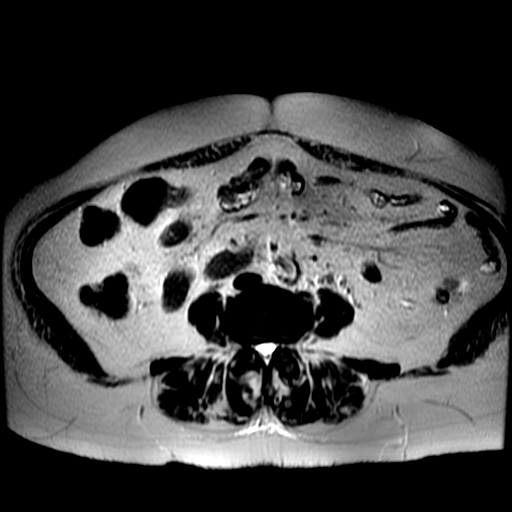

[Series 8: T2 fat-sat · axial · 6.0mm · 0.74mm/px · 1 of 38 slices shown]
[im 1/38]
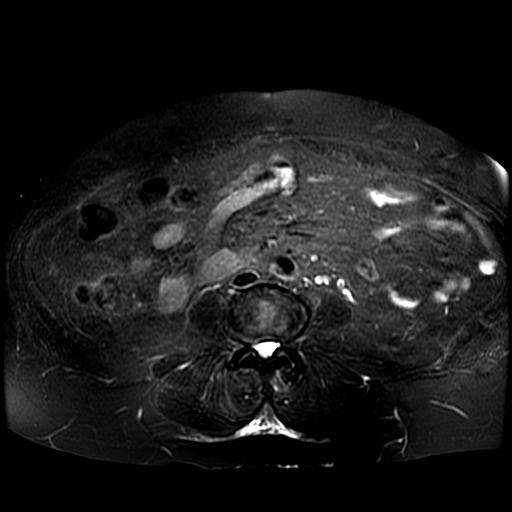

[Series 9: DWI b500 · axial · 8.0mm · 1.76mm/px · 1 of 50 slices shown]
[im 1/50]
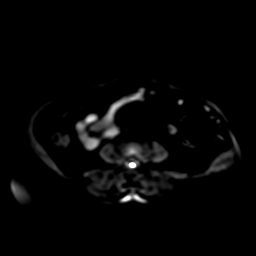

[Series 10: T1 dynamic · axial · 5.0mm · 0.74mm/px · z∈[-8,+249]mm · 2 of 104 slices shown (1 of 4)]
[im 1/104]
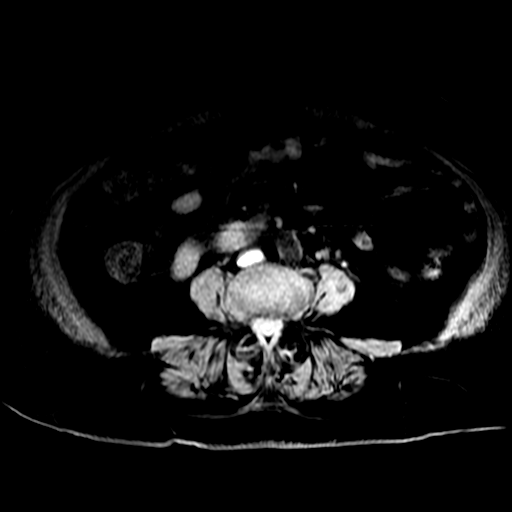
[im 104/104]
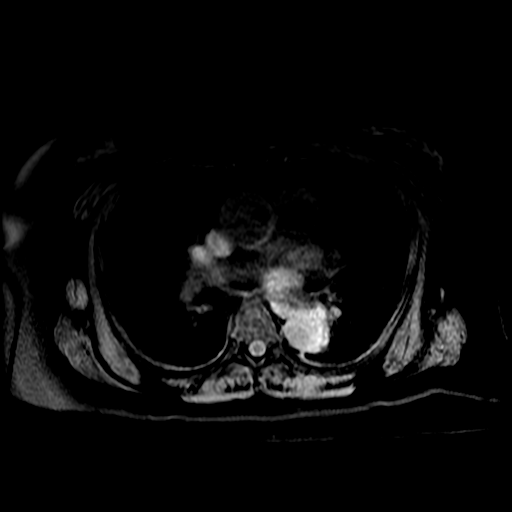

[Series 14: radial 2d thick · coronal · 40.0mm · 0.86mm/px · 1 of 5 slices shown]
[im 1/5]
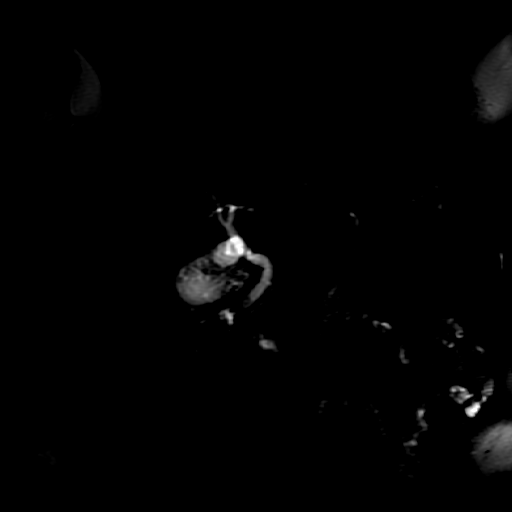

[Series 16: T1 dynamic · coronal · 3.4mm · 1.56mm/px · 2 of 116 slices shown (2 of 4)]
[im 1/116]
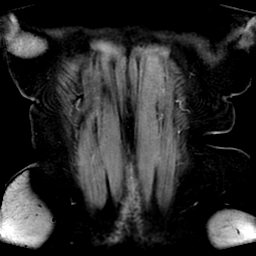
[im 116/116]
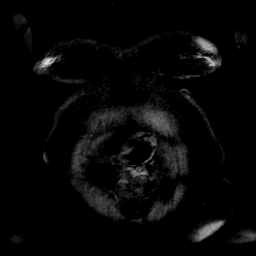

[Series 17: T1 dynamic post-contrast · axial · delayed · 5.0mm · 0.74mm/px · z∈[-8,+249]mm · 2 of 104 slices shown (1 of 2)]
[im 1/104]
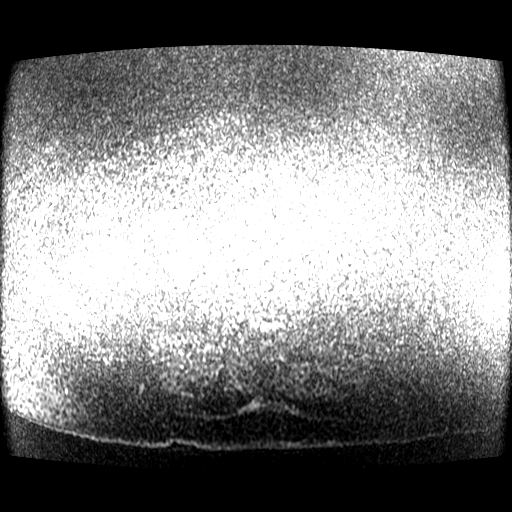
[im 104/104]
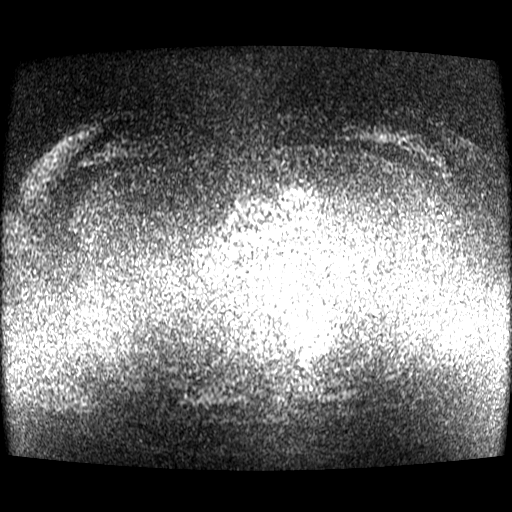

[Series 100: ((id)/(date)..104)-((id)/(id)/1..104) · axial · 5.0mm · 0.74mm/px · z∈[-8,+249]mm · 2 of 104 slices shown]
[im 1/104]
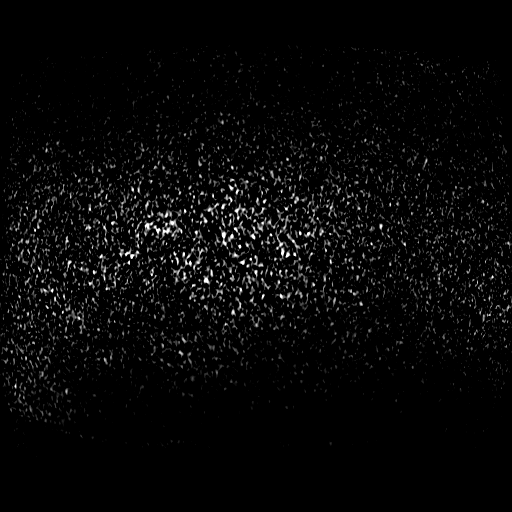
[im 104/104]
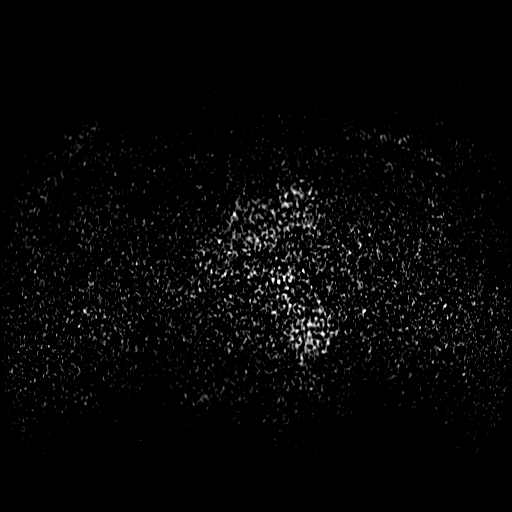

[Series 950: ADC · axial · 8.0mm · 1.76mm/px · 1 of 25 slices shown]
[im 1/25]
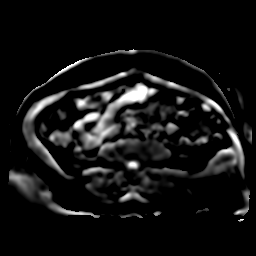

[Series 1001: T1 dynamic · axial · 5.0mm · 0.74mm/px · z∈[-8,+249]mm · 2 of 104 slices shown (3 of 4)]
[im 1/104]
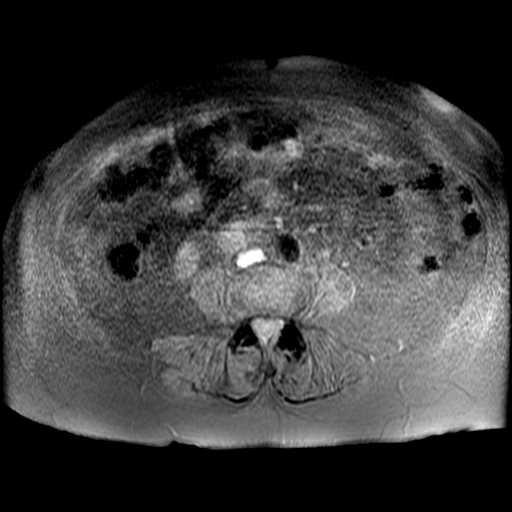
[im 104/104]
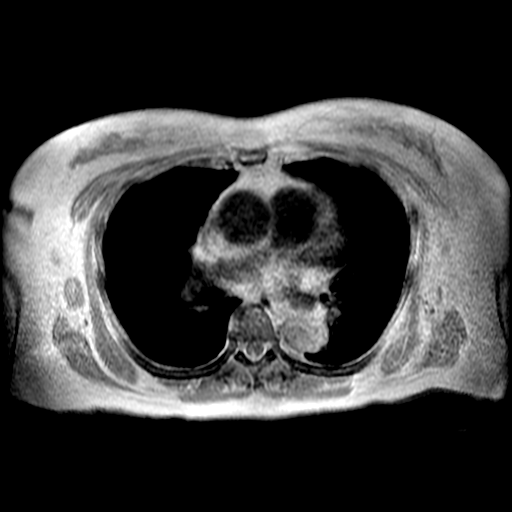

[Series 1002: T1 dynamic · axial · 5.0mm · 0.74mm/px · z∈[-8,+249]mm · 2 of 104 slices shown (4 of 4)]
[im 1/104]
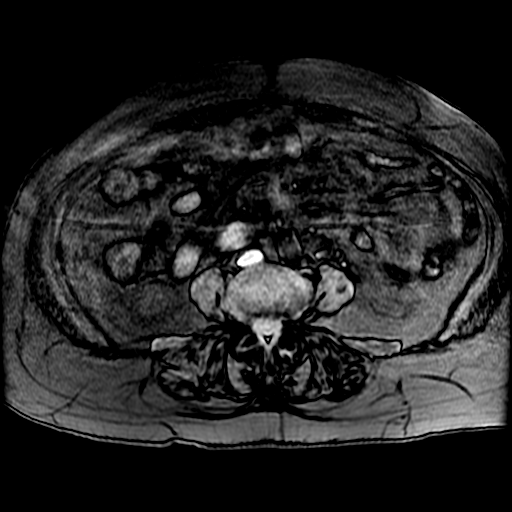
[im 104/104]
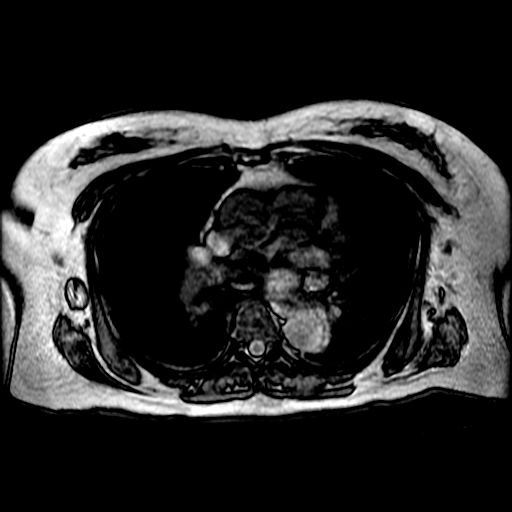

[Series 1350: MRCP · oblique · 1.8mm · 0.70mm/px · 1 of 6 slices shown]
[im 1/6]
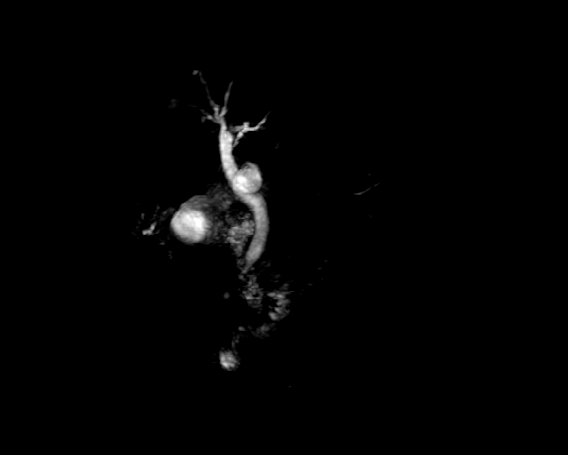

[Series 1500: T1 dynamic post-contrast · axial · non-contrast · 5.0mm · 0.74mm/px · z∈[-8,+119]mm · 2 of 104 slices shown (2 of 2)]
[im 1/104]
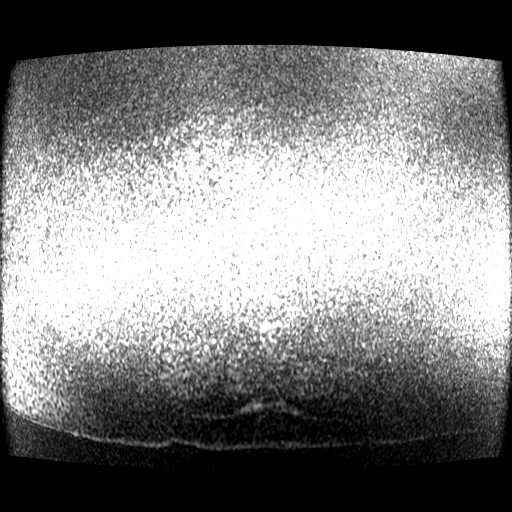
[im 52/104]
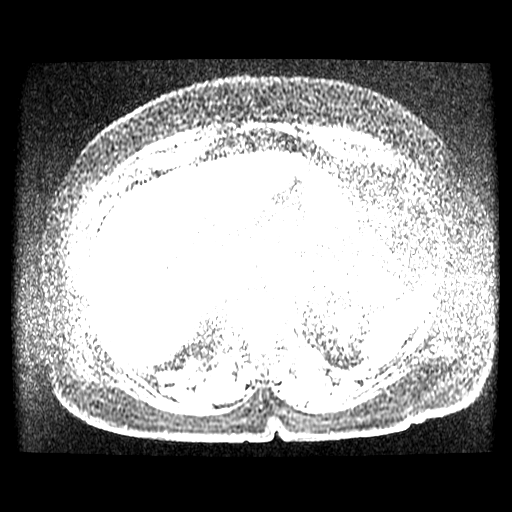

[21 of 48 positions shown; findings below may reference images not displayed]

FINDINGS: Lower chest:  Lung bases are clear.

Hepatobiliary: No significant intrahepatic duct dilatation. No focal
hepatic lesion.

There are multiple gallstones layering dependently within the
gallbladder ranging size from 2 mm to 8 mm. There approximately 24
stones. There is mild pericholecystic fluid and gallbladder wall
thickening.

The common bile duct is dilated to 8 mm (image [DATE]). There is at
least 1 distal stone in the common bile duct measuring approximately
2 mm on image [DATE]). Additional stones or sludge or stones are
evident on MRCP sequence image 1/series 14.

Pancreas: No significant pancreatic duct dilatation. Small amount
fluid along the body and tail the pancreas. The pancreatic
parenchyma is normal signal intensity. Postcontrast imaging
demonstrates uniform enhancement the pancreatic parenchyma. No
organized fluid collections.

Spleen: Normal spleen.

Adrenals/urinary tract: Adrenal glands and kidneys are normal.

Stomach/Bowel: Stomach and limited of the small bowel is
unremarkable

Vascular/Lymphatic: Abdominal aortic normal caliber. No
retroperitoneal periportal lymphadenopathy.

Musculoskeletal: No aggressive osseous lesion
IMPRESSION: 1. Findings consistent with gallstone pancreatitis.
2. Several small filling defects within distal common bile duct
consistent with choledocholithiasis. Mild common bile duct
dilatation.
3. Multiple gallstones layering in the lumen gallbladder of varying
size. Mild gallbladder wall thickening and pericholecystic fluid.
Recommend clinical correlation for acute cholecystitis.
4. Mild pancreatitis. No evidence of pancreatic necrosis. No
organized fluid collections.

These results will be called to the ordering clinician or
representative by the Radiologist Assistant, and communication
documented in the PACS or zVision Dashboard.

## 2019-08-08 IMAGING — RF DG CHOLANGIOGRAM OPERATIVE
1 series · 5 of 5 positions shown · non-contrast
Comparison: ERCP 11/04/2017; MRCP 11/03/2017

CLINICAL DATA: 77-year-old female status post laparoscopic
cholecystectomy with intraoperative cholangiogram

EXAM:
INTRAOPERATIVE CHOLANGIOGRAM
TECHNIQUE: Cholangiographic images from the C-arm fluoroscopic device were
submitted for interpretation post-operatively. Please see the
procedural report for the amount of contrast and the fluoroscopy
time utilized.

[Series 1: run · 2 acquisitions, 5 frames shown]
[im 1/2]
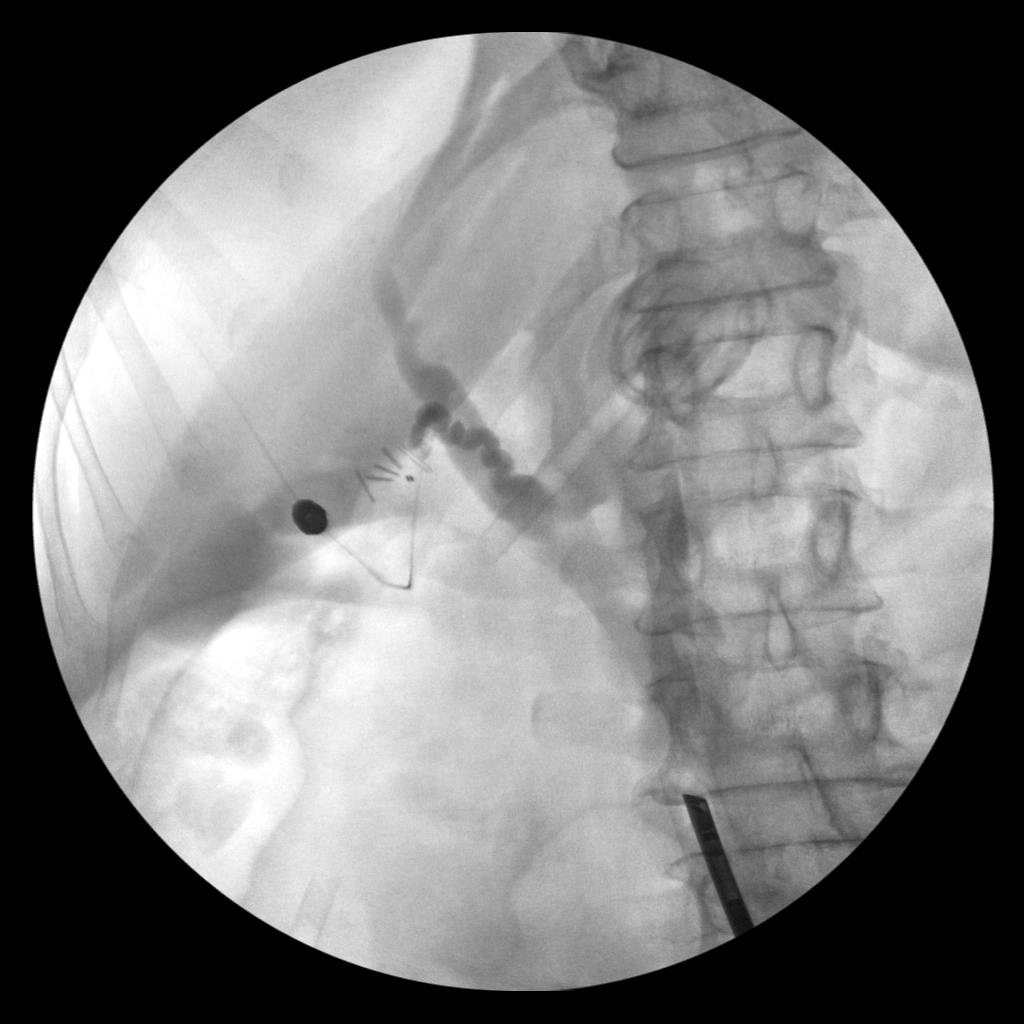
[im 1/2]
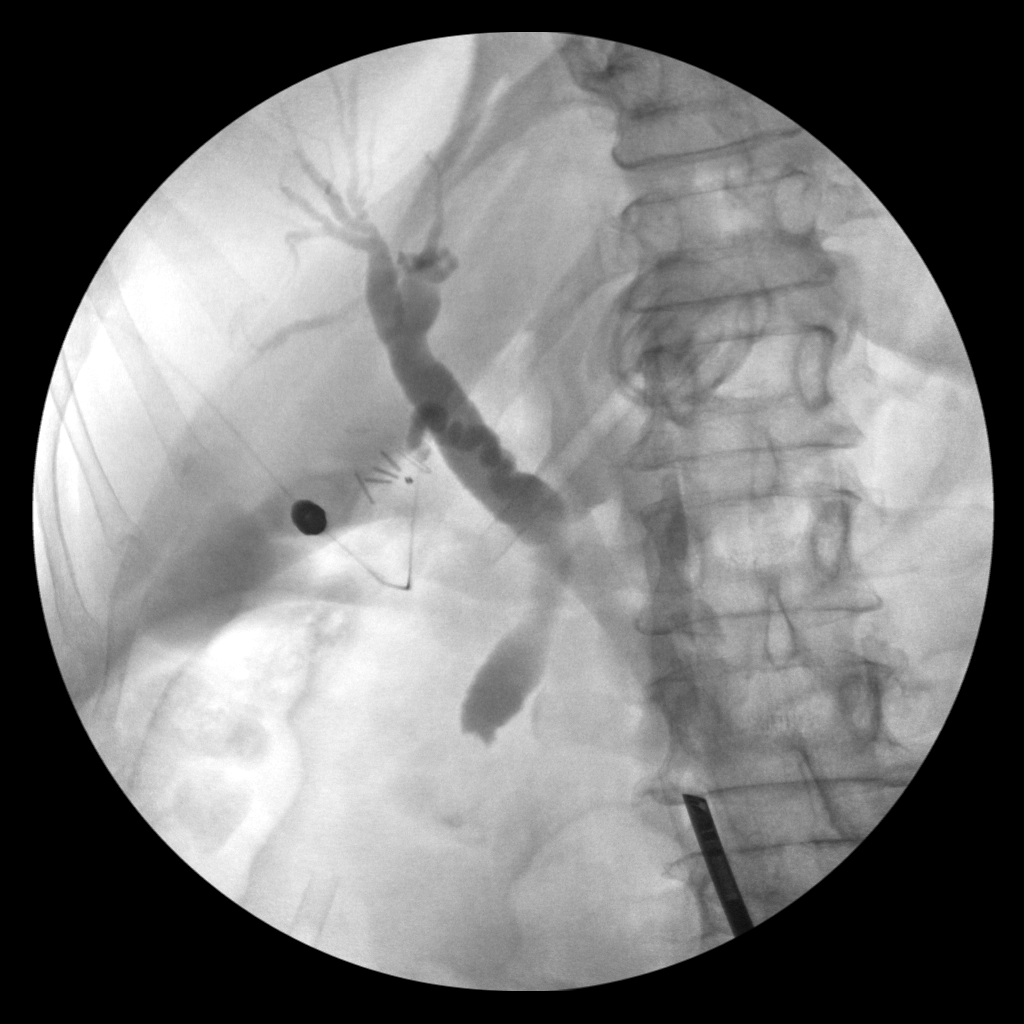
[im 1/2]
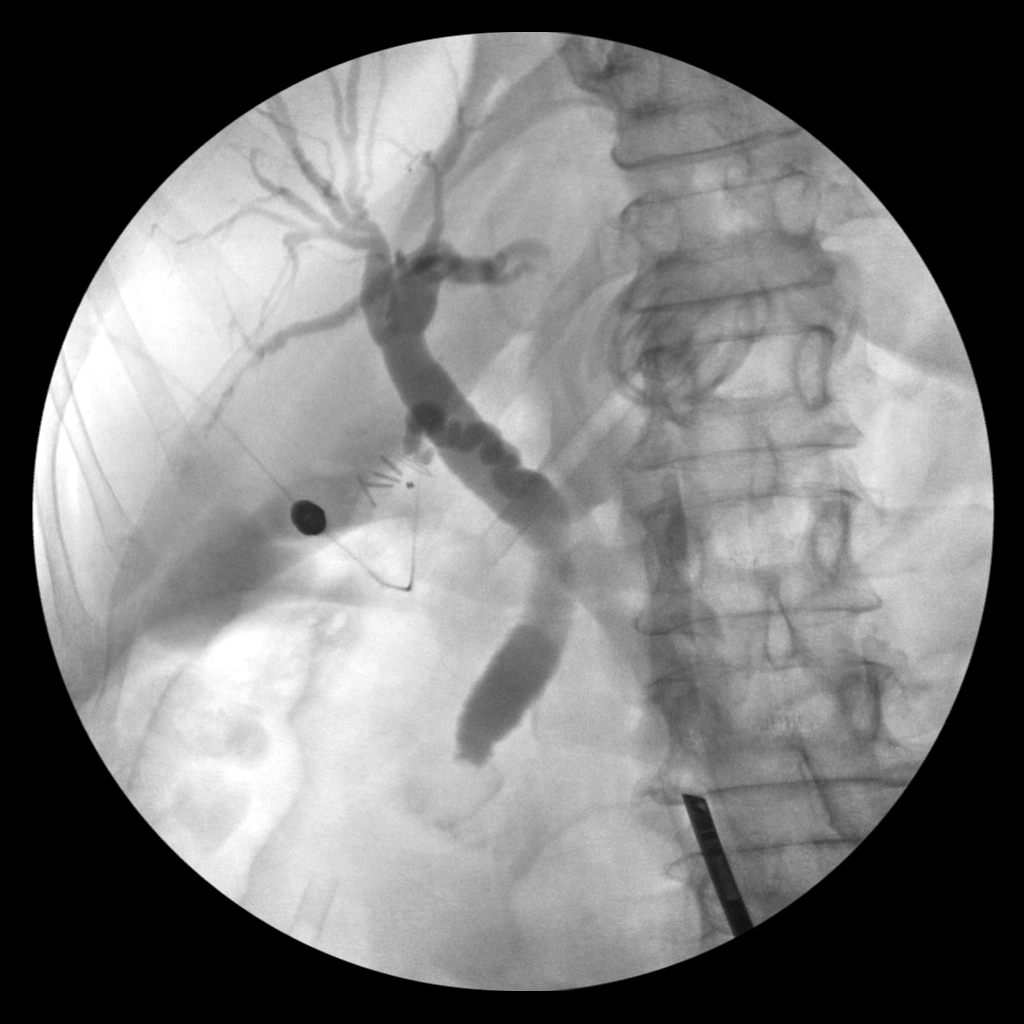
[im 1/2]
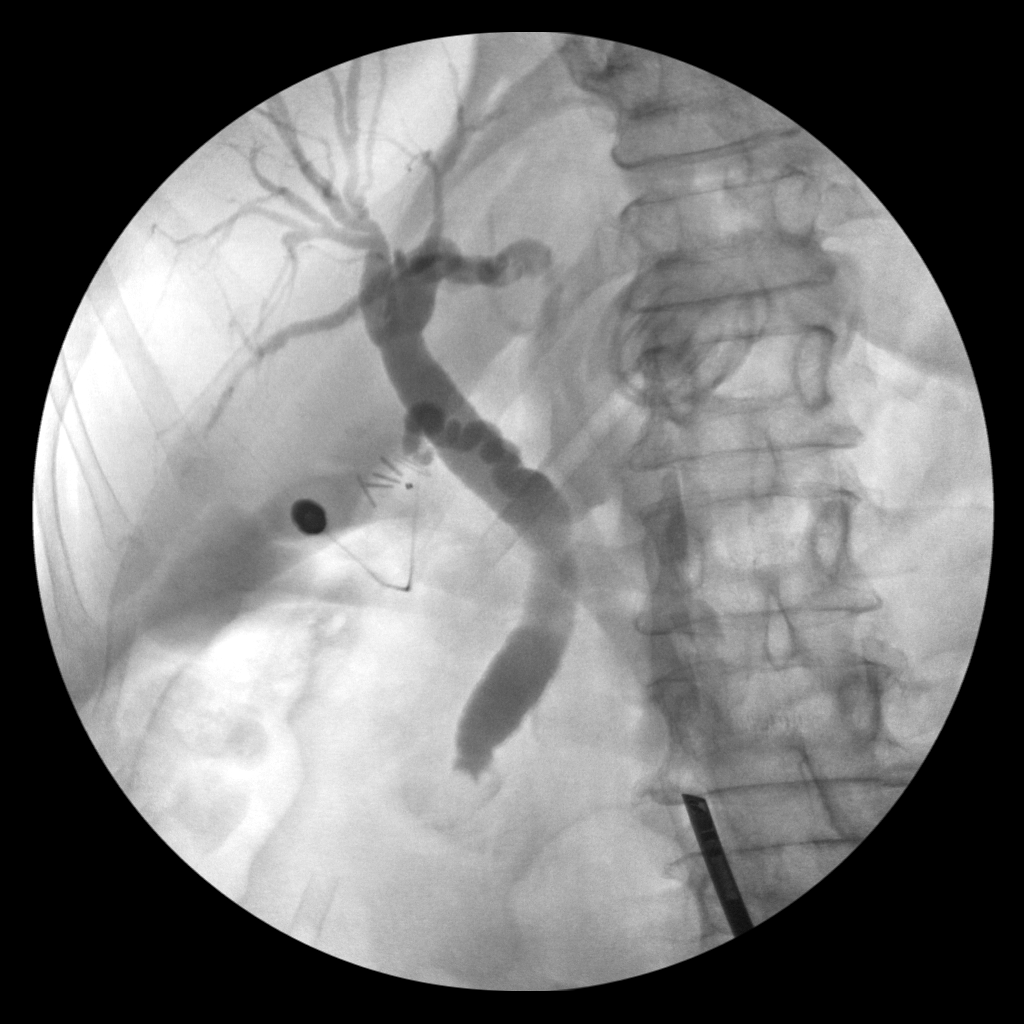
[im 2/2]
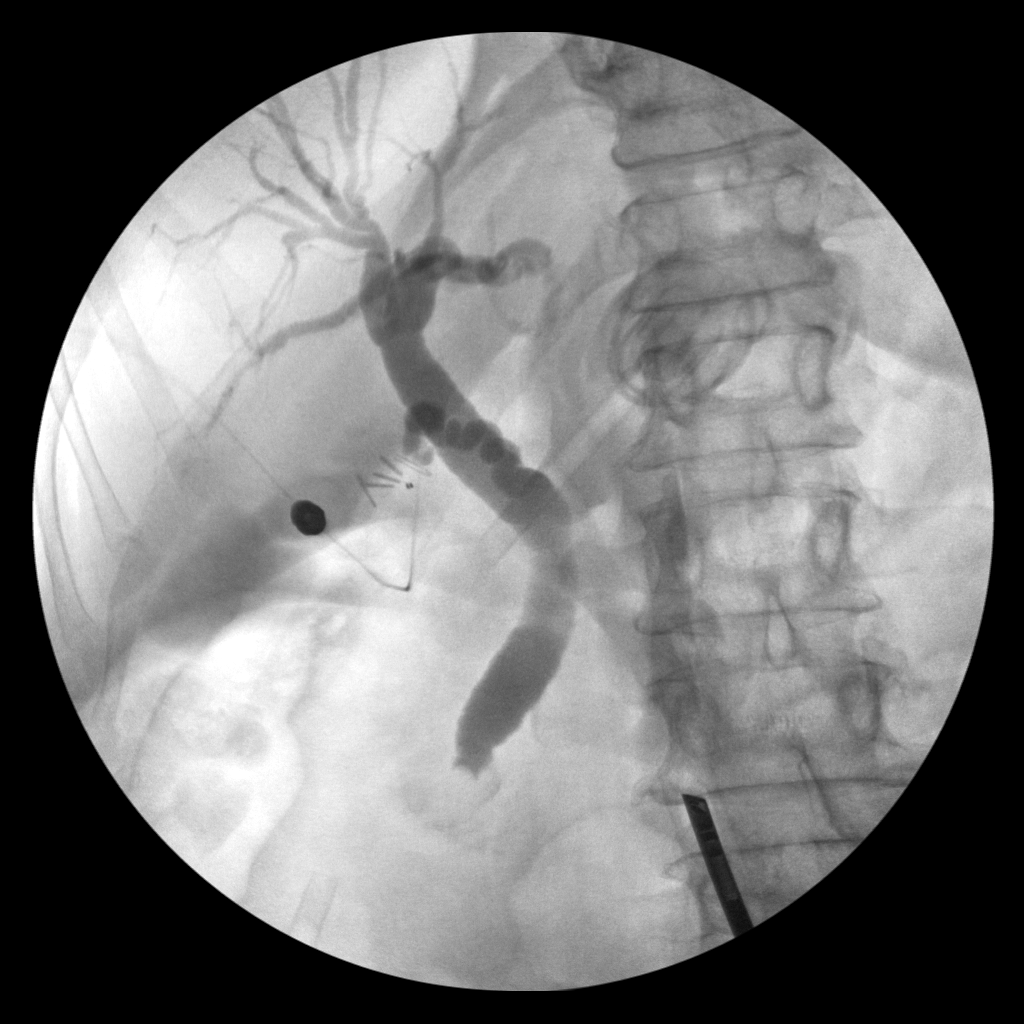

[5 of 5 positions shown; findings below may reference images not displayed]

FINDINGS: Cine loop and single intraoperative saved image obtained during
intraoperative cholangiogram at the time of laparoscopic
cholecystectomy. The images demonstrate cannulation of the cystic
duct remanent and opacification of the biliary tree. There is
moderate biliary ductal dilatation. A small amount of contrast
material is seen passing through the ampulla and into the duodenum.
There does appear to be a persistent rounded filling defect in the
distal common duct.
IMPRESSION: 1. Questionable rounded filling defect in the distal common bile
duct which may represent a residual stone, sludge, or perhaps
ampullary edema.
2. A small amount of contrast material does pass around the filling
defect and into the duodenum.

## 2019-08-24 DIAGNOSIS — G4733 Obstructive sleep apnea (adult) (pediatric): Secondary | ICD-10-CM | POA: Diagnosis not present

## 2019-08-31 DIAGNOSIS — R5383 Other fatigue: Secondary | ICD-10-CM | POA: Diagnosis not present

## 2019-08-31 DIAGNOSIS — Z79899 Other long term (current) drug therapy: Secondary | ICD-10-CM | POA: Diagnosis not present

## 2019-08-31 DIAGNOSIS — E559 Vitamin D deficiency, unspecified: Secondary | ICD-10-CM | POA: Diagnosis not present

## 2019-08-31 DIAGNOSIS — I10 Essential (primary) hypertension: Secondary | ICD-10-CM | POA: Diagnosis not present

## 2019-08-31 DIAGNOSIS — R252 Cramp and spasm: Secondary | ICD-10-CM | POA: Diagnosis not present

## 2019-09-04 DIAGNOSIS — R32 Unspecified urinary incontinence: Secondary | ICD-10-CM | POA: Diagnosis not present

## 2019-09-04 DIAGNOSIS — E559 Vitamin D deficiency, unspecified: Secondary | ICD-10-CM | POA: Diagnosis not present

## 2019-09-04 DIAGNOSIS — R3915 Urgency of urination: Secondary | ICD-10-CM | POA: Diagnosis not present

## 2019-09-04 DIAGNOSIS — I10 Essential (primary) hypertension: Secondary | ICD-10-CM | POA: Diagnosis not present

## 2019-09-04 DIAGNOSIS — Z800 Family history of malignant neoplasm of digestive organs: Secondary | ICD-10-CM | POA: Diagnosis not present

## 2019-09-04 DIAGNOSIS — R5383 Other fatigue: Secondary | ICD-10-CM | POA: Diagnosis not present

## 2019-09-04 DIAGNOSIS — R252 Cramp and spasm: Secondary | ICD-10-CM | POA: Diagnosis not present

## 2019-09-22 DIAGNOSIS — H2513 Age-related nuclear cataract, bilateral: Secondary | ICD-10-CM | POA: Diagnosis not present

## 2019-09-22 DIAGNOSIS — H53123 Transient visual loss, bilateral: Secondary | ICD-10-CM | POA: Diagnosis not present

## 2019-09-22 DIAGNOSIS — H04123 Dry eye syndrome of bilateral lacrimal glands: Secondary | ICD-10-CM | POA: Diagnosis not present

## 2019-09-23 DIAGNOSIS — G4733 Obstructive sleep apnea (adult) (pediatric): Secondary | ICD-10-CM | POA: Diagnosis not present

## 2019-10-09 DIAGNOSIS — G4733 Obstructive sleep apnea (adult) (pediatric): Secondary | ICD-10-CM | POA: Diagnosis not present

## 2019-10-09 DIAGNOSIS — G8929 Other chronic pain: Secondary | ICD-10-CM | POA: Diagnosis not present

## 2019-10-09 DIAGNOSIS — G629 Polyneuropathy, unspecified: Secondary | ICD-10-CM | POA: Diagnosis not present

## 2019-10-09 DIAGNOSIS — E785 Hyperlipidemia, unspecified: Secondary | ICD-10-CM | POA: Diagnosis not present

## 2019-10-09 DIAGNOSIS — R69 Illness, unspecified: Secondary | ICD-10-CM | POA: Diagnosis not present

## 2019-10-09 DIAGNOSIS — J449 Chronic obstructive pulmonary disease, unspecified: Secondary | ICD-10-CM | POA: Diagnosis not present

## 2019-10-09 DIAGNOSIS — I739 Peripheral vascular disease, unspecified: Secondary | ICD-10-CM | POA: Diagnosis not present

## 2019-10-09 DIAGNOSIS — E669 Obesity, unspecified: Secondary | ICD-10-CM | POA: Diagnosis not present

## 2019-10-12 DIAGNOSIS — G4733 Obstructive sleep apnea (adult) (pediatric): Secondary | ICD-10-CM | POA: Diagnosis not present

## 2019-10-12 DIAGNOSIS — Z800 Family history of malignant neoplasm of digestive organs: Secondary | ICD-10-CM | POA: Diagnosis not present

## 2019-10-12 DIAGNOSIS — R69 Illness, unspecified: Secondary | ICD-10-CM | POA: Diagnosis not present

## 2019-10-12 DIAGNOSIS — R5383 Other fatigue: Secondary | ICD-10-CM | POA: Diagnosis not present

## 2019-10-12 DIAGNOSIS — E559 Vitamin D deficiency, unspecified: Secondary | ICD-10-CM | POA: Diagnosis not present

## 2019-10-12 DIAGNOSIS — I10 Essential (primary) hypertension: Secondary | ICD-10-CM | POA: Diagnosis not present

## 2019-10-12 DIAGNOSIS — G629 Polyneuropathy, unspecified: Secondary | ICD-10-CM | POA: Diagnosis not present

## 2019-10-12 DIAGNOSIS — R252 Cramp and spasm: Secondary | ICD-10-CM | POA: Diagnosis not present

## 2019-10-12 DIAGNOSIS — I2699 Other pulmonary embolism without acute cor pulmonale: Secondary | ICD-10-CM | POA: Diagnosis not present

## 2019-10-22 DIAGNOSIS — G4733 Obstructive sleep apnea (adult) (pediatric): Secondary | ICD-10-CM | POA: Diagnosis not present

## 2019-10-27 DIAGNOSIS — M179 Osteoarthritis of knee, unspecified: Secondary | ICD-10-CM | POA: Diagnosis not present

## 2019-10-27 DIAGNOSIS — I2510 Atherosclerotic heart disease of native coronary artery without angina pectoris: Secondary | ICD-10-CM | POA: Diagnosis not present

## 2019-10-27 DIAGNOSIS — E782 Mixed hyperlipidemia: Secondary | ICD-10-CM | POA: Diagnosis not present

## 2019-10-27 DIAGNOSIS — J449 Chronic obstructive pulmonary disease, unspecified: Secondary | ICD-10-CM | POA: Diagnosis not present

## 2019-10-27 DIAGNOSIS — E7800 Pure hypercholesterolemia, unspecified: Secondary | ICD-10-CM | POA: Diagnosis not present

## 2019-10-27 DIAGNOSIS — M1990 Unspecified osteoarthritis, unspecified site: Secondary | ICD-10-CM | POA: Diagnosis not present

## 2019-10-27 DIAGNOSIS — M169 Osteoarthritis of hip, unspecified: Secondary | ICD-10-CM | POA: Diagnosis not present

## 2019-10-27 DIAGNOSIS — J4521 Mild intermittent asthma with (acute) exacerbation: Secondary | ICD-10-CM | POA: Diagnosis not present

## 2019-10-27 DIAGNOSIS — I10 Essential (primary) hypertension: Secondary | ICD-10-CM | POA: Diagnosis not present

## 2019-10-27 DIAGNOSIS — E039 Hypothyroidism, unspecified: Secondary | ICD-10-CM | POA: Diagnosis not present

## 2019-11-22 DIAGNOSIS — G4733 Obstructive sleep apnea (adult) (pediatric): Secondary | ICD-10-CM | POA: Diagnosis not present

## 2019-11-23 DIAGNOSIS — J4521 Mild intermittent asthma with (acute) exacerbation: Secondary | ICD-10-CM | POA: Diagnosis not present

## 2019-11-23 DIAGNOSIS — M169 Osteoarthritis of hip, unspecified: Secondary | ICD-10-CM | POA: Diagnosis not present

## 2019-11-23 DIAGNOSIS — E039 Hypothyroidism, unspecified: Secondary | ICD-10-CM | POA: Diagnosis not present

## 2019-11-23 DIAGNOSIS — J449 Chronic obstructive pulmonary disease, unspecified: Secondary | ICD-10-CM | POA: Diagnosis not present

## 2019-11-23 DIAGNOSIS — M179 Osteoarthritis of knee, unspecified: Secondary | ICD-10-CM | POA: Diagnosis not present

## 2019-11-23 DIAGNOSIS — E782 Mixed hyperlipidemia: Secondary | ICD-10-CM | POA: Diagnosis not present

## 2019-11-23 DIAGNOSIS — I2510 Atherosclerotic heart disease of native coronary artery without angina pectoris: Secondary | ICD-10-CM | POA: Diagnosis not present

## 2019-11-23 DIAGNOSIS — M1990 Unspecified osteoarthritis, unspecified site: Secondary | ICD-10-CM | POA: Diagnosis not present

## 2019-11-23 DIAGNOSIS — I10 Essential (primary) hypertension: Secondary | ICD-10-CM | POA: Diagnosis not present

## 2019-11-23 DIAGNOSIS — E7800 Pure hypercholesterolemia, unspecified: Secondary | ICD-10-CM | POA: Diagnosis not present

## 2019-11-25 ENCOUNTER — Other Ambulatory Visit: Payer: Self-pay | Admitting: *Deleted

## 2019-11-25 DIAGNOSIS — I712 Thoracic aortic aneurysm, without rupture, unspecified: Secondary | ICD-10-CM

## 2019-12-10 ENCOUNTER — Telehealth: Payer: Self-pay | Admitting: Interventional Cardiology

## 2019-12-10 NOTE — Telephone Encounter (Signed)
New Message:  She wanted to be sure we update pt's pharmacy to [facility] please.

## 2019-12-10 NOTE — Telephone Encounter (Signed)
Updated pharmacy as requested.

## 2019-12-11 DIAGNOSIS — J4521 Mild intermittent asthma with (acute) exacerbation: Secondary | ICD-10-CM | POA: Diagnosis not present

## 2019-12-11 DIAGNOSIS — E782 Mixed hyperlipidemia: Secondary | ICD-10-CM | POA: Diagnosis not present

## 2019-12-11 DIAGNOSIS — J449 Chronic obstructive pulmonary disease, unspecified: Secondary | ICD-10-CM | POA: Diagnosis not present

## 2019-12-11 DIAGNOSIS — I10 Essential (primary) hypertension: Secondary | ICD-10-CM | POA: Diagnosis not present

## 2019-12-11 DIAGNOSIS — M169 Osteoarthritis of hip, unspecified: Secondary | ICD-10-CM | POA: Diagnosis not present

## 2019-12-11 DIAGNOSIS — R69 Illness, unspecified: Secondary | ICD-10-CM | POA: Diagnosis not present

## 2019-12-11 DIAGNOSIS — M179 Osteoarthritis of knee, unspecified: Secondary | ICD-10-CM | POA: Diagnosis not present

## 2019-12-11 DIAGNOSIS — E039 Hypothyroidism, unspecified: Secondary | ICD-10-CM | POA: Diagnosis not present

## 2019-12-11 DIAGNOSIS — I2510 Atherosclerotic heart disease of native coronary artery without angina pectoris: Secondary | ICD-10-CM | POA: Diagnosis not present

## 2019-12-13 NOTE — Progress Notes (Signed)
Cardiology Office Note:    Date:  12/18/2019   ID:  Denise Cline, DOB 10/15/1940, MRN [id number]  PCP:  Denise Huddle, MD  Cardiologist:  Denise Grooms, MD   Referring MD: Denise Huddle, MD   Chief Complaint  Patient presents with  . Coronary Artery Disease  . Thoracic Aortic Aneurysm    History of Present Illness:    Denise Cline is a 80 y.o. 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  She is doing well.  She has not had angina or sudden shortness of breath.  No back or chest pain.  She is having difficulty with Denise Cline left hip and left leg.  The pain radiates from a hip down the posterior aspect of the left leg.  It is not exertional related.  She is not having orthopnea, PND, palpitations, or anginal quality discomfort.  Past Medical History:  Diagnosis Date  . Anxiety   . COPD (chronic obstructive pulmonary disease) ([place])   . DVT (deep venous thrombosis) ([place]) 2014, 2017  . Fibroid   . History of shingles   . Hypertension   . PE (pulmonary thromboembolism) ([place]) 2017  . Thyroid disease    Nodules  . Varicose veins     Past Surgical History:  Procedure Laterality Date  . ABDOMINAL HYSTERECTOMY    . CHOLECYSTECTOMY N/A 11/05/2017   Procedure: LAPAROSCOPIC CHOLECYSTECTOMY WITH INTRAOPERATIVE CHOLANGIOGRAM;  Surgeon: Jovita Kussmaul, MD;  Location: [place];  Service: General;  Laterality: N/A;  . COLONOSCOPY N/A 10/05/2014   Procedure: COLONOSCOPY;  Surgeon: Ronald Lobo, MD;  Location: [facility]COPY;  Service: Endoscopy;  Laterality: N/A;  . ERCP N/A 11/04/2017   Procedure: ENDOSCOPIC RETROGRADE CHOLANGIOPANCREATOGRAPHY (ERCP) Balloon Extraction;  Surgeon: Ronnette Juniper, MD;  Location: [place];  Service:  Gastroenterology;  Laterality: N/A;  . LAPAROSCOPIC CHOLECYSTECTOMY W/ CHOLANGIOGRAPHY  11/05/2017   Dr. Marlou Starks  . OOPHORECTOMY     One ovary removed  . SPHINCTEROTOMY  11/04/2017   Procedure: SPHINCTEROTOMY;  Surgeon: Ronnette Juniper, MD;  Location: [facility] END[facility]COPY;  Service: Gastroenterology;;  . TOTAL THYROIDECTOMY      Current Medications: Current Meds  Medication Sig  . acetaminophen (TYLENOL) 650 MG CR tablet Take 1,300 mg by mouth 2 (two) times a day.   Marland Kitchen buPROPion (WELLBUTRIN SR) 150 MG 12 hr tablet Take 150 mg by mouth 2 (two) times daily.   . calcium-vitamin D (OSCAL WITH D) 250-125 MG-UNIT tablet Take 2 tablets by mouth daily.  . hydrochlorothiazide (HYDRODIURIL) 25 MG tablet Take 25 mg by mouth daily.   Marland Kitchen losartan (COZAAR) 100 MG tablet Take 100 mg by mouth daily.  . metoprolol succinate (TOPROL-XL) 25 MG 24 hr tablet Take 1/2 (one-half) tablet by mouth once daily  . Multiple Vitamins-Minerals (CENTRUM SILVER ADULT 50+) TABS Take 1 tablet by mouth daily.  . pantoprazole (PROTONIX) 40 MG tablet Take 1 tablet by mouth once daily  . polyethylene glycol (MIRALAX / GLYCOLAX) packet Take 17 g by mouth daily.  . rivaroxaban (XARELTO) 20 MG TABS tablet Take 20 mg by mouth daily with supper.  . rosuvastatin (CRESTOR) 20 MG tablet TAKE 1 TABLET BY MOUTH EVERY DAY  . sertraline (ZOLOFT) 50 MG tablet TAKE 1/2 TABLET BY MOUTH ONCE A DAY WITH MEALS FOR 7 DAYS, THEN  1 TAB DAILY  . traMADol (ULTRAM) 50 MG tablet Take 1 tablet (50 mg total) by mouth every 6 (six) hours as needed for moderate pain.  Marland Kitchen triamcinolone (KENALOG) 0.025 % cream Apply 1 application topically daily as needed for itching.     Allergies:   Penicillins, Sulfa antibiotics, Citrus, Oxycodone, and Vicodin [hydrocodone-acetaminophen]   Social History   Socioeconomic History  . Marital status: Single    Spouse name: Not on file  . Number of children: 3  . Years of education: Not on file  . Highest education level: Not on file    Occupational History  . Not on file  Tobacco Use  . Smoking status: Former Smoker    Packs/day: 2.00    Years: 40.00    Pack years: 80.00    Types: Cigarettes    Quit date: 05/30/2008    Years since quitting: 11.5  . Smokeless tobacco: Former Systems developer    Quit date: 05/12/2008  . Tobacco comment: Counseled to remain smoke free  Substance and Sexual Activity  . Alcohol use: No    Alcohol/week: 0.0 standard drinks    Comment: Rare  . Drug use: No  . Sexual activity: Not Currently    Birth control/protection: Surgical  Other Topics Concern  . Not on file  Social History Narrative  . Not on file   Social Determinants of Health   Financial Resource Strain:   . Difficulty of Paying Living Expenses: Not on file  Food Insecurity:   . Worried About Charity fundraiser in the Last Year: Not on file  . Ran Out of Food in the Last Year: Not on file  Transportation Needs:   . Lack of Transportation (Medical): Not on file  . Lack of Transportation (Non-Medical): Not on file  Physical Activity:   . Days of Exercise per Week: Not on file  . Minutes of Exercise per Session: Not on file  Stress:   . Feeling of Stress : Not on file  Social Connections:   . Frequency of Communication with Friends and Family: Not on file  . Frequency of Social Gatherings with Friends and Family: Not on file  . Attends Religious Services: Not on file  . Active Member of Clubs or Organizations: Not on file  . Attends Archivist Meetings: Not on file  . Marital Status: Not on file     Family History: The patient's family history includes Crohn's disease in Denise Cline mother; Diabetes in Denise Cline sister; Hypertension in Denise Cline brother and sister; Stroke in Denise Cline father.  ROS:   Please see the history of present illness.    Back and left hip and leg discomfort.  All other systems reviewed and are negative.  EKGs/Labs/Other Studies Reviewed:    The following studies were reviewed today:  CT angiogram of aorta  October 2020: IMPRESSION: 1. Stable 4.3 cm ascending Aortic aneurysm NOS ([id number]) without complicating features. Recommend annual imaging followup by CTA or MRA. This recommendation follows 2010 ACCF/AHA/AATS/ACR/ASA/SCA/SCAI/SIR/STS/SVM Guidelines for the Diagnosis and Management of Patients with Thoracic Aortic Disease. Circulation. 2010; 121: T024-O973 2. Stable right axillary and subpectoral adenopathy since 01/16/2016, favoring inflammatory/reactive process over neoplasm. 3. Aortic Atherosclerosis (ICD10-I70.0).   EKG:  EKG sinus rhythm with normal appearing EKG unchanged from the last tracing performed Jul 29, 2018.  Recent Labs: 03/09/2019: ALT 11; BUN 24; Creatinine, Ser 0.91; Potassium 4.4; Sodium 140  Recent Lipid Panel    Component Value Date/Time   CHOL 204 (H) 12/25/2017  0816   TRIG 96 12/25/2017 0816   HDL 60 12/25/2017 0816   CHOLHDL 3.4 12/25/2017 0816   CHOLHDL 3.7 05/15/2009 0555   VLDL 22 05/15/2009 0555   LDLCALC 125 (H) 12/25/2017 0816    Physical Exam:    VS:  BP 130/78   Pulse 68   Ht 5\' 6"  (1.676 m)   Wt 206 lb 6.4 oz (93.6 kg)   SpO2 94%   BMI 33.31 kg/m     Wt Readings from Last 3 Encounters:  12/18/19 206 lb 6.4 oz (93.6 kg)  03/09/19 211 lb (95.7 kg)  12/24/18 201 lb (91.2 kg)     GEN: Overweight. No acute distress HEENT: Normal NECK: No JVD. LYMPHATICS: No lymphadenopathy CARDIAC:  RRR without murmur, gallop, or edema. VASCULAR:  Normal Pulses. No bruits. RESPIRATORY:  Clear to auscultation without rales, wheezing or rhonchi  ABDOMEN: Soft, non-tender, non-distended, No pulsatile mass, MUSCULOSKELETAL: No deformity  SKIN: Warm and dry NEUROLOGIC:  Alert and oriented x 3 PSYCHIATRIC:  Normal affect   ASSESSMENT:    1. Coronary artery calcification seen on CAT scan   2. Essential hypertension   3. High risk medication use   4. Acute deep vein thrombosis (DVT) of femoral vein of left lower extremity (HCC)   5. Pulmonary  embolism without acute cor pulmonale, unspecified chronicity, unspecified pulmonary embolism type ([place])   6. Thoracic aortic aneurysm without rupture ([place])   7. Hyperlipidemia with target LDL less than 100   8. Educated about COVID-19 virus infection    PLAN:    In order of problems listed above:  1. Primary prevention with lipid and blood pressure management. 2. Continue Cozaar and Toprol-XL for BP control.  Blood pressure today is normal at 130/78 mmHg.  I will plan on seeing Denise Cline back in 6 months, splitting the year with Dr. Caffie Pinto. 3. Continue Xarelto 20 mg/day.  Continue for prior history of recurrent pulmonary emboli.  History of DVT. 4. See above 5. See above 6. Will have CT angiogram done of the chest, with contrast to evaluate the aorta.  Followed by Dr. Gilford Raid. 7. Continue Crestor 20 mg/day.  Most recent LDL was 123 in 2021.  We may need to continue consider up titration of statin therapy.  Should probably be on max dose and may even need ezetimibe added. 8. She has been vaccinated and is practicing social distancing.   Medication Adjustments/Labs and Tests Ordered: Current medicines are reviewed at length with the patient today.  Concerns regarding medicines are outlined above.  Orders Placed This Encounter  Procedures  . EKG 12-Lead   No orders of the defined types were placed in this encounter.   Patient Instructions  Medication Instructions:  Your physician recommends that you continue on your current medications as directed. Please refer to the Current Medication list given to you today.  *If you need a refill on your cardiac medications before your next appointment, please call your pharmacy*   Lab Work: None If you have labs (blood work) drawn today and your tests are completely normal, you will receive your results only by: Marland Kitchen MyChart Message (if you have MyChart) OR . A paper copy in the mail If you have any lab test that is abnormal or we need to change  your treatment, we will call you to review the results.   Testing/Procedures: None   Follow-Up: At [facility], you and your health needs are our priority.  As part of our continuing  mission to provide you with exceptional heart care, we have created designated Provider Care Teams.  These Care Teams include your primary Cardiologist (physician) and Advanced Practice Providers (APPs -  Physician Assistants and Nurse Practitioners) who all work together to provide you with the care you need, when you need it.  We recommend signing up for the patient portal called "MyChart".  Sign up information is provided on this After Visit Summary.  MyChart is used to connect with patients for Virtual Visits (Telemedicine).  Patients are able to view lab/test results, encounter notes, upcoming appointments, etc.  Non-urgent messages can be sent to your provider as well.   To learn more about what you can do with MyChart, go to [email or web address].    Your next appointment:   6 month(s)  The format for your next appointment:   In Person  Provider:   You may see Denise Grooms, MD or one of the following Advanced Practice Providers on your designated Care Team:    Truitt Merle, NP  Cecilie Kicks, NP  Kathyrn Drown, NP    Other Instructions      Signed, Denise Grooms, MD  12/18/2019 4:33 PM    [place]

## 2019-12-18 ENCOUNTER — Other Ambulatory Visit: Payer: Self-pay

## 2019-12-18 ENCOUNTER — Ambulatory Visit (INDEPENDENT_AMBULATORY_CARE_PROVIDER_SITE_OTHER): Payer: Medicare HMO | Admitting: Interventional Cardiology

## 2019-12-18 ENCOUNTER — Encounter: Payer: Self-pay | Admitting: Interventional Cardiology

## 2019-12-18 VITALS — BP 130/78 | HR 68 | Ht 66.0 in | Wt 206.4 lb

## 2019-12-18 DIAGNOSIS — I712 Thoracic aortic aneurysm, without rupture, unspecified: Secondary | ICD-10-CM

## 2019-12-18 DIAGNOSIS — E785 Hyperlipidemia, unspecified: Secondary | ICD-10-CM | POA: Diagnosis not present

## 2019-12-18 DIAGNOSIS — Z7189 Other specified counseling: Secondary | ICD-10-CM | POA: Diagnosis not present

## 2019-12-18 DIAGNOSIS — I2699 Other pulmonary embolism without acute cor pulmonale: Secondary | ICD-10-CM | POA: Diagnosis not present

## 2019-12-18 DIAGNOSIS — I10 Essential (primary) hypertension: Secondary | ICD-10-CM | POA: Diagnosis not present

## 2019-12-18 DIAGNOSIS — I82412 Acute embolism and thrombosis of left femoral vein: Secondary | ICD-10-CM

## 2019-12-18 DIAGNOSIS — I2510 Atherosclerotic heart disease of native coronary artery without angina pectoris: Secondary | ICD-10-CM | POA: Diagnosis not present

## 2019-12-18 DIAGNOSIS — Z79899 Other long term (current) drug therapy: Secondary | ICD-10-CM | POA: Diagnosis not present

## 2019-12-18 NOTE — Patient Instructions (Signed)

## 2019-12-22 DIAGNOSIS — G4733 Obstructive sleep apnea (adult) (pediatric): Secondary | ICD-10-CM | POA: Diagnosis not present

## 2019-12-30 ENCOUNTER — Ambulatory Visit: Payer: Medicare HMO | Admitting: Surgery

## 2019-12-30 ENCOUNTER — Encounter: Payer: Self-pay | Admitting: Surgery

## 2019-12-30 ENCOUNTER — Other Ambulatory Visit: Payer: Self-pay

## 2019-12-30 ENCOUNTER — Ambulatory Visit
Admission: RE | Admit: 2019-12-30 | Discharge: 2019-12-30 | Disposition: A | Discharge status: Home / Self Care | Payer: Medicare HMO | Source: Ambulatory Visit | Attending: Surgery | Admitting: Surgery

## 2019-12-30 VITALS — BP 143/79 | HR 73 | Resp 20 | Ht 66.0 in | Wt 208.0 lb

## 2019-12-30 DIAGNOSIS — I712 Thoracic aortic aneurysm, without rupture, unspecified: Secondary | ICD-10-CM

## 2019-12-30 MED ORDER — IOPAMIDOL (ISOVUE-370) INJECTION 76%
75.0000 mL | Freq: Once | INTRAVENOUS | Status: AC | PRN
  Administered 2019-12-30: 75 mL via INTRAVENOUS

## 2019-12-30 NOTE — Progress Notes (Signed)
HPI:  The patient is a 79 year old woman who returns for follow-up of a 4.3 cm fusiform ascending aortic aneurysm.  Since I saw her last year she has continued to feel fairly well.  She does report being tired but denies any shortness of breath.  She has had no chest or back pain.  She has noticed a hoarse voice recently.  Current Outpatient Medications  Medication Sig Dispense Refill  . acetaminophen (TYLENOL) 650 MG CR tablet Take 1,300 mg by mouth 2 (two) times a day.     Marland Kitchen buPROPion (WELLBUTRIN SR) 150 MG 12 hr tablet Take 150 mg by mouth 2 (two) times daily.     . calcium-vitamin D (OSCAL WITH D) 250-125 MG-UNIT tablet Take 2 tablets by mouth daily.    . hydrochlorothiazide (HYDRODIURIL) 25 MG tablet Take 25 mg by mouth daily.     Marland Kitchen losartan (COZAAR) 100 MG tablet Take 100 mg by mouth daily.    . metoprolol succinate (TOPROL-XL) 25 MG 24 hr tablet Take 1/2 (one-half) tablet by mouth once daily 45 tablet 2  . Multiple Vitamins-Minerals (CENTRUM SILVER ADULT 50+) TABS Take 1 tablet by mouth daily.    . pantoprazole (PROTONIX) 40 MG tablet Take 1 tablet by mouth once daily 90 tablet 2  . polyethylene glycol (MIRALAX / GLYCOLAX) packet Take 17 g by mouth daily. 14 each 0  . rivaroxaban (XARELTO) 20 MG TABS tablet Take 20 mg by mouth daily with supper.    . rosuvastatin (CRESTOR) 20 MG tablet TAKE 1 TABLET BY MOUTH EVERY DAY 90 tablet 3  . sertraline (ZOLOFT) 50 MG tablet TAKE 1/2 TABLET BY MOUTH ONCE A DAY WITH MEALS FOR 7 DAYS, THEN 1 TAB DAILY    . traMADol (ULTRAM) 50 MG tablet Take 1 tablet (50 mg total) by mouth every 6 (six) hours as needed for moderate pain. 30 tablet 0  . triamcinolone (KENALOG) 0.025 % cream Apply 1 application topically daily as needed for itching.     No current facility-administered medications for this visit.     Physical Exam: BP (!) 143/79 (BP Location: Right Arm, Patient Position: Sitting)   Pulse 73   Resp 20   Ht 5\' 6"  (1.676 m)   Wt 208 lb  (94.3 kg)   SpO2 95% Comment: RA with mask on  BMI 33.57 kg/m  She looks well. There is a palpable right thyroid nodule. Cardiac exam shows regular rate and rhythm with normal heart sounds. Lungs are clear.  Diagnostic Tests:  CLINICAL DATA:  Thoracic aortic aneurysm.  EXAM: CT ANGIOGRAPHY CHEST WITH CONTRAST  TECHNIQUE: Multidetector CT imaging of the chest was performed using the standard protocol during bolus administration of intravenous contrast. Multiplanar CT image reconstructions and MIPs were obtained to evaluate the vascular anatomy.  CONTRAST:  50mL ISOVUE-370 IOPAMIDOL (ISOVUE-370) INJECTION 76%  COMPARISON:  December 24, 2018.  FINDINGS: Cardiovascular: Grossly stable 4.3 cm ascending thoracic aortic aneurysm is noted. Transverse aortic arch measures 2.5 cm. Proximal descending thoracic aorta measures 3 cm. No dissection is noted. Great vessels are widely patent without significant stenosis. Mild cardiomegaly is noted. No pericardial effusion is noted.  Mediastinum/Nodes: 2.8 cm right thyroid nodule is noted. Status post left thyroidectomy. Esophagus is unremarkable. Stable bilateral axillary adenopathy is noted.  Lungs/Pleura: No pneumothorax or pleural effusion is noted. Several blebs are noted in the right lower lobe. No definite consolidative process is noted  Upper Abdomen: No acute abnormality.  Musculoskeletal: No chest wall  abnormality. No acute or significant osseous findings.  Review of the MIP images confirms the above findings.  IMPRESSION: 1. Grossly stable 4.3 cm ascending thoracic aortic aneurysm. No dissection is noted. Recommend annual imaging followup by CTA or MRA. This recommendation follows 2010 ACCF/AHA/AATS/ACR/ASA/SCA/SCAI/SIR/STS/SVM Guidelines for the Diagnosis and Management of Patients with Thoracic Aortic Disease. Circulation. 2010; 121: E268-T419. Aortic aneurysm NOS (ICD10-I71.9). 2. Stable 2.8 cm right  thyroid nodule is noted. Recommend thyroid US. (Ref: J Am Coll Radiol. 2015 Feb;12(2): 143-50). 3. Stable bilateral axillary adenopathy is noted.   Electronically Signed   By: Marijo Conception M.D.   On: 12/30/2019 13:25   Impression:  She has a stable 4.3 cm ascending aortic aneurysm.  This is well below the surgical threshold of 5.5 cm.  I reviewed the CTA images with her and answered her questions.  I stressed the importance of continued good blood pressure control and preventing further enlargement and acute aortic dissection.  She also has a stable 2.8 cm right thyroid nodule.  She is status post left thyroid lobectomy in the 1980s.  She does not remember the details about that.  She thinks that she is developing some hoarseness.  I will refer her to Dr. Armandina Gemma for further work-up and treatment if indicated.  Plan:  She will be referred to Dr. Armandina Gemma for evaluation of a right thyroid nodule.  She will return to see me in 1 year with a CT of the chest for follow-up of her ascending aortic aneurysm.  I spent 20 minutes performing this established patient evaluation and > 50% of this time was spent face to face counseling and coordinating the care of this patient's aortic aneurysm.     Gaye Pollack, MD [facility] [phone]

## 2019-12-31 DIAGNOSIS — Z780 Asymptomatic menopausal state: Secondary | ICD-10-CM | POA: Diagnosis not present

## 2019-12-31 DIAGNOSIS — Z1231 Encounter for screening mammogram for malignant neoplasm of breast: Secondary | ICD-10-CM | POA: Diagnosis not present

## 2020-01-04 ENCOUNTER — Other Ambulatory Visit: Payer: Self-pay | Admitting: Surgery

## 2020-01-04 DIAGNOSIS — E041 Nontoxic single thyroid nodule: Secondary | ICD-10-CM

## 2020-01-08 ENCOUNTER — Ambulatory Visit
Admission: RE | Admit: 2020-01-08 | Discharge: 2020-01-08 | Disposition: A | Discharge status: Home / Self Care | Payer: Medicare HMO | Source: Ambulatory Visit | Attending: Surgery | Admitting: Surgery

## 2020-01-08 DIAGNOSIS — E041 Nontoxic single thyroid nodule: Secondary | ICD-10-CM | POA: Diagnosis not present

## 2020-01-12 ENCOUNTER — Ambulatory Visit
Admission: EM | Admit: 2020-01-12 | Discharge: 2020-01-12 | Disposition: A | Discharge status: Home / Self Care | Payer: Medicare HMO | Attending: Emergency Medicine | Admitting: Emergency Medicine

## 2020-01-12 DIAGNOSIS — M5432 Sciatica, left side: Secondary | ICD-10-CM

## 2020-01-12 DIAGNOSIS — N6001 Solitary cyst of right breast: Secondary | ICD-10-CM | POA: Diagnosis not present

## 2020-01-12 DIAGNOSIS — R922 Inconclusive mammogram: Secondary | ICD-10-CM | POA: Diagnosis not present

## 2020-01-12 DIAGNOSIS — R928 Other abnormal and inconclusive findings on diagnostic imaging of breast: Secondary | ICD-10-CM | POA: Diagnosis not present

## 2020-01-12 MED ORDER — METHYLPREDNISOLONE SODIUM SUCC 125 MG IJ SOLR
80.0000 mg | Freq: Once | INTRAMUSCULAR | Status: AC
  Administered 2020-01-12: 80 mg via INTRAMUSCULAR

## 2020-01-12 NOTE — ED Provider Notes (Signed)
[name]    CSN: [id number] Arrival date & time: 01/12/20  1503      History   Chief Complaint Chief Complaint  Patient presents with  . Leg Pain    HPI Denise Cline is a 79 y.o. female  Patient is a history as below presenting for left-sided sciatica.  Has been evaluated for this in the past by her cardiologist, though did not receive treatment at that evaluation.  Reports compliance with her Xarelto for history of PE.  Denies chest pain or difficulty breathing.  No trauma, injury, saddle anesthesia, lower leg numbness or weakness.  Denies urinary retention or fecal incontinence.  Past Medical History:  Diagnosis Date  . Anxiety   . COPD (chronic obstructive pulmonary disease) ([place])   . DVT (deep venous thrombosis) ([place]) 2014, 2017  . Fibroid   . History of shingles   . Hypertension   . PE (pulmonary thromboembolism) ([place]) 2017  . Thyroid disease    Nodules  . Varicose veins     Patient Active Problem List   Diagnosis Date Noted  . Choledocholithiasis with acute cholecystitis with obstruction 11/02/2017  . Chronic kidney disease, stage 3 ([place]) 11/02/2017  . DVT (deep venous thrombosis) ([place]) 11/12/2015  . Pulmonary emboli ([place]) 11/11/2015  . Acute pulmonary embolism ([place]) 11/11/2015  . Coronary artery calcification seen on CAT scan 05/12/2015  . Thoracic aortic aneurysm ([place]) 05/12/2015  . Dyspnea 03/17/2015  . Left leg swelling 03/16/2015  . Acute colitis 10/04/2014  . COPD (chronic obstructive pulmonary disease) ([place])   . PVD (peripheral vascular disease) ([place]) 04/01/2012  . Leg pain 04/01/2012  . Swelling of limb 04/01/2012  . History of shingles   . Hypertension   . Fibroid   . Thyroid disease   . Anxiety     Past Surgical History:  Procedure Laterality Date  . ABDOMINAL HYSTERECTOMY    . CHOLECYSTECTOMY N/A 11/05/2017   Procedure: LAPAROSCOPIC CHOLECYSTECTOMY WITH INTRAOPERATIVE CHOLANGIOGRAM;  Surgeon: Jovita Kussmaul, MD;   Location: [place];  Service: General;  Laterality: N/A;  . COLONOSCOPY N/A 10/05/2014   Procedure: COLONOSCOPY;  Surgeon: Ronald Lobo, MD;  Location: [facility];  Service: Endoscopy;  Laterality: N/A;  . ERCP N/A 11/04/2017   Procedure: ENDOSCOPIC RETROGRADE CHOLANGIOPANCREATOGRAPHY (ERCP) Balloon Extraction;  Surgeon: Ronnette Juniper, MD;  Location: [place];  Service: Gastroenterology;  Laterality: N/A;  . LAPAROSCOPIC CHOLECYSTECTOMY W/ CHOLANGIOGRAPHY  11/05/2017   Dr. Marlou Starks  . OOPHORECTOMY     One ovary removed  . SPHINCTEROTOMY  11/04/2017   Procedure: SPHINCTEROTOMY;  Surgeon: Ronnette Juniper, MD;  Location: [facility] ENDOSCOPY;  Service: Gastroenterology;;  . TOTAL THYROIDECTOMY      OB History    Gravida  3   Para  3   Term  3   Preterm      AB      Living  3     SAB      TAB      Ectopic      Multiple      Live Births               Home Medications    Prior to Admission medications   Medication Sig Start Date End Date Taking? Authorizing Provider  acetaminophen (TYLENOL) 650 MG CR tablet Take 1,300 mg by mouth 2 (two) times a day.     [provider]  buPROPion (WELLBUTRIN SR) 150 MG 12 hr tablet Take 150 mg by mouth 2 (two)  times daily.     [provider]  calcium-vitamin D (OSCAL WITH D) 250-125 MG-UNIT tablet Take 2 tablets by mouth daily.    [provider]  hydrochlorothiazide (HYDRODIURIL) 25 MG tablet Take 25 mg by mouth daily.  02/05/18   [provider]  losartan (COZAAR) 100 MG tablet Take 100 mg by mouth daily. 02/05/18   [provider]  metoprolol succinate (TOPROL-XL) 25 MG 24 hr tablet Take 1/2 (one-half) tablet by mouth once daily 05/20/19   Belva Crome, MD  Multiple Vitamins-Minerals (CENTRUM SILVER ADULT 50+) TABS Take 1 tablet by mouth daily.    [provider]  pantoprazole (PROTONIX) 40 MG tablet Take 1 tablet by mouth once daily 05/20/19   Belva Crome, MD  polyethylene glycol [facility] /  Floria Raveling) packet Take 17 g by mouth daily. 11/07/17   Norm Parcel, PA-C  rivaroxaban (XARELTO) 20 MG TABS tablet Take 20 mg by mouth daily with supper.    [provider]  rosuvastatin (CRESTOR) 20 MG tablet TAKE 1 TABLET BY MOUTH EVERY DAY 11/11/18   Belva Crome, MD  sertraline (ZOLOFT) 50 MG tablet TAKE 1/2 TABLET BY MOUTH ONCE A DAY WITH MEALS FOR 7 DAYS, THEN 1 TAB DAILY 02/12/19   [provider]  traMADol (ULTRAM) 50 MG tablet Take 1 tablet (50 mg total) by mouth every 6 (six) hours as needed for moderate pain. 08/23/15   Mesner, Corene Cornea, MD  triamcinolone (KENALOG) 0.025 % cream Apply 1 application topically daily as needed for itching. 03/29/17   [provider]    Family History Family History  Problem Relation Age of Onset  . Crohn's disease Mother   . Stroke Father   . Diabetes Sister   . Hypertension Sister   . Hypertension Brother     Social History Social History   Tobacco Use  . Smoking status: Former Smoker    Packs/day: 2.00    Years: 40.00    Pack years: 80.00    Types: Cigarettes    Quit date: 05/30/2008    Years since quitting: 11.6  . Smokeless tobacco: Former Systems developer    Quit date: 05/12/2008  . Tobacco comment: Counseled to remain smoke free  Substance Use Topics  . Alcohol use: No    Alcohol/week: 0.0 standard drinks    Comment: Rare  . Drug use: No     Allergies   Penicillins, Sulfa antibiotics, Citrus, Oxycodone, and Vicodin [hydrocodone-acetaminophen]   Review of Systems Review of Systems  Constitutional: Negative for fatigue and fever.  HENT: Negative for ear pain, sinus pain, sore throat and voice change.   Eyes: Negative for pain, redness and visual disturbance.  Respiratory: Negative for cough and shortness of breath.   Cardiovascular: Negative for chest pain and palpitations.  Gastrointestinal: Negative for abdominal pain, diarrhea and vomiting.  Musculoskeletal: Positive for back pain. Negative for arthralgias  and myalgias.  Skin: Negative for rash and wound.  Neurological: Negative for syncope and headaches.     Physical Exam Triage Vital Signs ED Triage Vitals  Enc Vitals Group     BP      Pulse      Resp      Temp      Temp src      SpO2      Weight      Height      Head Circumference      Peak Flow      Pain Score  Pain Loc      Pain Edu?      Excl. in [place]?    No data found.  Updated Vital Signs BP 134/80 (BP Location: Left Arm)   Pulse 88   Temp 98.1 F (36.7 C) (Oral)   Resp 20   SpO2 94%   Visual Acuity Right Eye Distance:   Left Eye Distance:   Bilateral Distance:    Right Eye Near:   Left Eye Near:    Bilateral Near:     Physical Exam Constitutional:      General: She is not in acute distress. HENT:     Head: Normocephalic and atraumatic.  Eyes:     General: No scleral icterus.    Pupils: Pupils are equal, round, and reactive to light.  Cardiovascular:     Rate and Rhythm: Normal rate.  Pulmonary:     Effort: Pulmonary effort is normal.  Musculoskeletal:        General: Tenderness present.       Back:     Comments: positive SLR of left, negative right  Skin:    Coloration: Skin is not jaundiced or pale.  Neurological:     Mental Status: She is alert and oriented to person, place, and time.      UC Treatments / Results  Labs (all labs ordered are listed, but only abnormal results are displayed) Labs Reviewed - No data to display  EKG   Radiology No results found.  Procedures Procedures (including critical care time)  Medications Ordered in UC Medications  methylPREDNISolone sodium succinate (SOLU-MEDROL) 125 mg/2 mL injection 80 mg (80 mg Intramuscular Given 01/12/20 1609)    Initial Impression / Assessment and Plan / UC Course  I have reviewed the triage vital signs and the nursing notes.  Pertinent labs & imaging results that were available during my care of the patient were reviewed by me and considered in my medical  decision making (see chart for details).     H&P consistent with left-sided sciatica: Given Solu-Medrol in office which he tolerated well.  Will avoid NSAIDs, follow-up with PCP for further evaluation management as needed. Final Clinical Impressions(s) / UC Diagnoses   Final diagnoses:  Left sided sciatica   Discharge Instructions   None    ED Prescriptions    None     PDMP not reviewed this encounter.   Neldon Mc Hobgood, [place] 01/12/20 1835

## 2020-01-12 NOTE — ED Triage Notes (Signed)
Pt c/o bilateral leg pain for months. States told her cardiologist last week and he said its probably sciatic pain but didn't treat her. Denies injury or sitting for long periods of time.

## 2020-01-20 ENCOUNTER — Other Ambulatory Visit (HOSPITAL_COMMUNITY): Payer: Self-pay | Admitting: Internal Medicine

## 2020-01-20 DIAGNOSIS — R600 Localized edema: Secondary | ICD-10-CM

## 2020-01-20 DIAGNOSIS — R0602 Shortness of breath: Secondary | ICD-10-CM

## 2020-01-21 ENCOUNTER — Other Ambulatory Visit: Payer: Self-pay

## 2020-01-21 ENCOUNTER — Ambulatory Visit (HOSPITAL_COMMUNITY)
Admission: RE | Admit: 2020-01-21 | Discharge: 2020-01-21 | Disposition: A | Discharge status: Home / Self Care | Payer: Medicare HMO | Source: Ambulatory Visit | Attending: Internal Medicine | Admitting: Internal Medicine

## 2020-01-21 DIAGNOSIS — R0602 Shortness of breath: Secondary | ICD-10-CM | POA: Diagnosis not present

## 2020-01-21 DIAGNOSIS — R600 Localized edema: Secondary | ICD-10-CM

## 2020-01-21 NOTE — Progress Notes (Signed)
Bilateral lower extremity venous duplex has been completed. Preliminary results can be found in CV Proc through chart review.  Results were given to Dr. Inda Merlin.  01/21/20 9:17 AM Denise Cline RVT

## 2020-01-22 DIAGNOSIS — M7062 Trochanteric bursitis, left hip: Secondary | ICD-10-CM | POA: Diagnosis not present

## 2020-01-22 DIAGNOSIS — Z23 Encounter for immunization: Secondary | ICD-10-CM | POA: Diagnosis not present

## 2020-01-22 DIAGNOSIS — M79606 Pain in leg, unspecified: Secondary | ICD-10-CM | POA: Diagnosis not present

## 2020-01-25 ENCOUNTER — Other Ambulatory Visit: Payer: Self-pay | Admitting: Surgery

## 2020-01-25 DIAGNOSIS — E041 Nontoxic single thyroid nodule: Secondary | ICD-10-CM

## 2020-02-02 DIAGNOSIS — M179 Osteoarthritis of knee, unspecified: Secondary | ICD-10-CM | POA: Diagnosis not present

## 2020-02-02 DIAGNOSIS — K219 Gastro-esophageal reflux disease without esophagitis: Secondary | ICD-10-CM | POA: Diagnosis not present

## 2020-02-02 DIAGNOSIS — J449 Chronic obstructive pulmonary disease, unspecified: Secondary | ICD-10-CM | POA: Diagnosis not present

## 2020-02-02 DIAGNOSIS — I2510 Atherosclerotic heart disease of native coronary artery without angina pectoris: Secondary | ICD-10-CM | POA: Diagnosis not present

## 2020-02-02 DIAGNOSIS — M169 Osteoarthritis of hip, unspecified: Secondary | ICD-10-CM | POA: Diagnosis not present

## 2020-02-02 DIAGNOSIS — E039 Hypothyroidism, unspecified: Secondary | ICD-10-CM | POA: Diagnosis not present

## 2020-02-02 DIAGNOSIS — E782 Mixed hyperlipidemia: Secondary | ICD-10-CM | POA: Diagnosis not present

## 2020-02-02 DIAGNOSIS — J4521 Mild intermittent asthma with (acute) exacerbation: Secondary | ICD-10-CM | POA: Diagnosis not present

## 2020-02-02 DIAGNOSIS — E7800 Pure hypercholesterolemia, unspecified: Secondary | ICD-10-CM | POA: Diagnosis not present

## 2020-02-02 DIAGNOSIS — I10 Essential (primary) hypertension: Secondary | ICD-10-CM | POA: Diagnosis not present

## 2020-02-09 ENCOUNTER — Other Ambulatory Visit (HOSPITAL_COMMUNITY)
Admission: RE | Admit: 2020-02-09 | Discharge: 2020-02-09 | Disposition: A | Discharge status: Home / Self Care | Payer: Medicare HMO | Source: Ambulatory Visit | Attending: Radiology | Admitting: Radiology

## 2020-02-09 ENCOUNTER — Ambulatory Visit
Admission: RE | Admit: 2020-02-09 | Discharge: 2020-02-09 | Disposition: A | Discharge status: Home / Self Care | Payer: Medicare HMO | Source: Ambulatory Visit | Attending: Surgery | Admitting: Surgery

## 2020-02-09 DIAGNOSIS — E041 Nontoxic single thyroid nodule: Secondary | ICD-10-CM | POA: Diagnosis not present

## 2020-02-09 DIAGNOSIS — D440 Neoplasm of uncertain behavior of thyroid gland: Secondary | ICD-10-CM | POA: Insufficient documentation

## 2020-02-10 LAB — CYTOLOGY - NON PAP

## 2020-02-13 DIAGNOSIS — E041 Nontoxic single thyroid nodule: Secondary | ICD-10-CM | POA: Diagnosis not present

## 2020-02-23 ENCOUNTER — Ambulatory Visit: Payer: Medicare HMO | Attending: Internal Medicine

## 2020-02-23 ENCOUNTER — Encounter (HOSPITAL_COMMUNITY): Payer: Self-pay

## 2020-02-23 DIAGNOSIS — Z23 Encounter for immunization: Secondary | ICD-10-CM

## 2020-02-23 NOTE — Progress Notes (Signed)
   Covid-19 Vaccination Clinic  Name:  Denise Cline    MRN: [id number] DOB: [birth year]  02/23/2020  [name] was observed post Covid-19 immunization for 15 minutes without incident. She was provided with Vaccine Information Sheet and instruction to access the V-Safe system.   [name] was instructed to call 911 with any severe reactions post vaccine: Marland Kitchen Difficulty breathing  . Swelling of face and throat  . A fast heartbeat  . A bad rash all over body  . Dizziness and weakness   Immunizations Administered    Name Date Dose VIS Date Route   Pfizer COVID-19 Vaccine 02/23/2020  3:28 PM 0.3 mL 12/23/2019 Intramuscular   Manufacturer: [place]   Lot: [id number]   [place]: [id number]

## 2020-03-01 ENCOUNTER — Telehealth: Payer: Self-pay | Admitting: Interventional Cardiology

## 2020-03-01 MED ORDER — METOPROLOL SUCCINATE ER 25 MG PO TB24: ORAL_TABLET | ORAL | 3 refills | Status: DC

## 2020-03-01 MED ORDER — ROSUVASTATIN CALCIUM 20 MG PO TABS: 20.0000 mg | ORAL_TABLET | Freq: Every day | ORAL | 3 refills | Status: DC

## 2020-03-01 MED ORDER — PANTOPRAZOLE SODIUM 40 MG PO TBEC: 40.0000 mg | DELAYED_RELEASE_TABLET | Freq: Every day | ORAL | 3 refills | Status: DC

## 2020-03-01 NOTE — Telephone Encounter (Signed)
Medications have been sent in to patient's requested pharmacy. Patient is aware.

## 2020-03-01 NOTE — Telephone Encounter (Signed)
*  STAT* If patient is at the pharmacy, call can be transferred to refill team.   1. Which medications need to be refilled? (please list name of each medication and dose if known)  metoprolol succinate (TOPROL-XL) 25 MG 24 hr tablet; pantoprazole (PROTONIX) 40 MG tablet; rosuvastatin (CRESTOR) 20 MG tablet   2. Which pharmacy/location (including street and city if local pharmacy) is medication to be sent to? [facility] - [place], [place] - [id number]  3. Do they need a 30 day or 90 day supply? 90  Medication needs to be sent before January 3rd. Please send asap!

## 2020-03-03 DIAGNOSIS — M169 Osteoarthritis of hip, unspecified: Secondary | ICD-10-CM | POA: Diagnosis not present

## 2020-03-03 DIAGNOSIS — J449 Chronic obstructive pulmonary disease, unspecified: Secondary | ICD-10-CM | POA: Diagnosis not present

## 2020-03-03 DIAGNOSIS — E039 Hypothyroidism, unspecified: Secondary | ICD-10-CM | POA: Diagnosis not present

## 2020-03-03 DIAGNOSIS — E782 Mixed hyperlipidemia: Secondary | ICD-10-CM | POA: Diagnosis not present

## 2020-03-03 DIAGNOSIS — J4521 Mild intermittent asthma with (acute) exacerbation: Secondary | ICD-10-CM | POA: Diagnosis not present

## 2020-03-03 DIAGNOSIS — I2510 Atherosclerotic heart disease of native coronary artery without angina pectoris: Secondary | ICD-10-CM | POA: Diagnosis not present

## 2020-03-03 DIAGNOSIS — R69 Illness, unspecified: Secondary | ICD-10-CM | POA: Diagnosis not present

## 2020-03-03 DIAGNOSIS — M1990 Unspecified osteoarthritis, unspecified site: Secondary | ICD-10-CM | POA: Diagnosis not present

## 2020-03-03 DIAGNOSIS — G43109 Migraine with aura, not intractable, without status migrainosus: Secondary | ICD-10-CM | POA: Diagnosis not present

## 2020-03-03 DIAGNOSIS — K529 Noninfective gastroenteritis and colitis, unspecified: Secondary | ICD-10-CM | POA: Diagnosis not present

## 2020-03-03 DIAGNOSIS — I10 Essential (primary) hypertension: Secondary | ICD-10-CM | POA: Diagnosis not present

## 2020-03-03 DIAGNOSIS — I82409 Acute embolism and thrombosis of unspecified deep veins of unspecified lower extremity: Secondary | ICD-10-CM | POA: Diagnosis not present

## 2020-03-03 DIAGNOSIS — K219 Gastro-esophageal reflux disease without esophagitis: Secondary | ICD-10-CM | POA: Diagnosis not present

## 2020-03-03 DIAGNOSIS — E7800 Pure hypercholesterolemia, unspecified: Secondary | ICD-10-CM | POA: Diagnosis not present

## 2020-03-17 DIAGNOSIS — H2513 Age-related nuclear cataract, bilateral: Secondary | ICD-10-CM | POA: Diagnosis not present

## 2020-03-17 DIAGNOSIS — H04123 Dry eye syndrome of bilateral lacrimal glands: Secondary | ICD-10-CM | POA: Diagnosis not present

## 2020-03-17 DIAGNOSIS — H53133 Sudden visual loss, bilateral: Secondary | ICD-10-CM | POA: Diagnosis not present

## 2020-03-17 DIAGNOSIS — H18413 Arcus senilis, bilateral: Secondary | ICD-10-CM | POA: Diagnosis not present

## 2020-04-04 DIAGNOSIS — M169 Osteoarthritis of hip, unspecified: Secondary | ICD-10-CM | POA: Diagnosis not present

## 2020-04-04 DIAGNOSIS — I2510 Atherosclerotic heart disease of native coronary artery without angina pectoris: Secondary | ICD-10-CM | POA: Diagnosis not present

## 2020-04-04 DIAGNOSIS — E782 Mixed hyperlipidemia: Secondary | ICD-10-CM | POA: Diagnosis not present

## 2020-04-04 DIAGNOSIS — M179 Osteoarthritis of knee, unspecified: Secondary | ICD-10-CM | POA: Diagnosis not present

## 2020-04-04 DIAGNOSIS — K219 Gastro-esophageal reflux disease without esophagitis: Secondary | ICD-10-CM | POA: Diagnosis not present

## 2020-04-04 DIAGNOSIS — I10 Essential (primary) hypertension: Secondary | ICD-10-CM | POA: Diagnosis not present

## 2020-04-04 DIAGNOSIS — E7800 Pure hypercholesterolemia, unspecified: Secondary | ICD-10-CM | POA: Diagnosis not present

## 2020-04-04 DIAGNOSIS — E039 Hypothyroidism, unspecified: Secondary | ICD-10-CM | POA: Diagnosis not present

## 2020-04-04 DIAGNOSIS — J4521 Mild intermittent asthma with (acute) exacerbation: Secondary | ICD-10-CM | POA: Diagnosis not present

## 2020-04-04 DIAGNOSIS — J449 Chronic obstructive pulmonary disease, unspecified: Secondary | ICD-10-CM | POA: Diagnosis not present

## 2020-04-08 DIAGNOSIS — E079 Disorder of thyroid, unspecified: Secondary | ICD-10-CM | POA: Diagnosis not present

## 2020-04-08 DIAGNOSIS — Z0000 Encounter for general adult medical examination without abnormal findings: Secondary | ICD-10-CM | POA: Diagnosis not present

## 2020-04-08 DIAGNOSIS — I10 Essential (primary) hypertension: Secondary | ICD-10-CM | POA: Diagnosis not present

## 2020-04-15 DIAGNOSIS — E041 Nontoxic single thyroid nodule: Secondary | ICD-10-CM | POA: Diagnosis not present

## 2020-04-19 DIAGNOSIS — Z7901 Long term (current) use of anticoagulants: Secondary | ICD-10-CM | POA: Diagnosis not present

## 2020-04-19 DIAGNOSIS — Z8719 Personal history of other diseases of the digestive system: Secondary | ICD-10-CM | POA: Diagnosis not present

## 2020-04-19 DIAGNOSIS — R197 Diarrhea, unspecified: Secondary | ICD-10-CM | POA: Diagnosis not present

## 2020-04-22 DIAGNOSIS — E669 Obesity, unspecified: Secondary | ICD-10-CM | POA: Diagnosis not present

## 2020-04-22 DIAGNOSIS — R69 Illness, unspecified: Secondary | ICD-10-CM | POA: Diagnosis not present

## 2020-04-22 DIAGNOSIS — E785 Hyperlipidemia, unspecified: Secondary | ICD-10-CM | POA: Diagnosis not present

## 2020-04-22 DIAGNOSIS — K219 Gastro-esophageal reflux disease without esophagitis: Secondary | ICD-10-CM | POA: Diagnosis not present

## 2020-04-22 DIAGNOSIS — I951 Orthostatic hypotension: Secondary | ICD-10-CM | POA: Diagnosis not present

## 2020-04-22 DIAGNOSIS — Z008 Encounter for other general examination: Secondary | ICD-10-CM | POA: Diagnosis not present

## 2020-04-22 DIAGNOSIS — L309 Dermatitis, unspecified: Secondary | ICD-10-CM | POA: Diagnosis not present

## 2020-04-22 DIAGNOSIS — I739 Peripheral vascular disease, unspecified: Secondary | ICD-10-CM | POA: Diagnosis not present

## 2020-04-22 DIAGNOSIS — I10 Essential (primary) hypertension: Secondary | ICD-10-CM | POA: Diagnosis not present

## 2020-04-22 DIAGNOSIS — R197 Diarrhea, unspecified: Secondary | ICD-10-CM | POA: Diagnosis not present

## 2020-04-22 DIAGNOSIS — K5900 Constipation, unspecified: Secondary | ICD-10-CM | POA: Diagnosis not present

## 2020-04-27 DIAGNOSIS — G43109 Migraine with aura, not intractable, without status migrainosus: Secondary | ICD-10-CM | POA: Diagnosis not present

## 2020-04-27 DIAGNOSIS — E782 Mixed hyperlipidemia: Secondary | ICD-10-CM | POA: Diagnosis not present

## 2020-04-27 DIAGNOSIS — I10 Essential (primary) hypertension: Secondary | ICD-10-CM | POA: Diagnosis not present

## 2020-04-27 DIAGNOSIS — R69 Illness, unspecified: Secondary | ICD-10-CM | POA: Diagnosis not present

## 2020-04-27 DIAGNOSIS — E7800 Pure hypercholesterolemia, unspecified: Secondary | ICD-10-CM | POA: Diagnosis not present

## 2020-04-27 DIAGNOSIS — J4521 Mild intermittent asthma with (acute) exacerbation: Secondary | ICD-10-CM | POA: Diagnosis not present

## 2020-04-27 DIAGNOSIS — E039 Hypothyroidism, unspecified: Secondary | ICD-10-CM | POA: Diagnosis not present

## 2020-04-27 DIAGNOSIS — K219 Gastro-esophageal reflux disease without esophagitis: Secondary | ICD-10-CM | POA: Diagnosis not present

## 2020-04-27 DIAGNOSIS — J449 Chronic obstructive pulmonary disease, unspecified: Secondary | ICD-10-CM | POA: Diagnosis not present

## 2020-05-04 DIAGNOSIS — I2510 Atherosclerotic heart disease of native coronary artery without angina pectoris: Secondary | ICD-10-CM | POA: Diagnosis not present

## 2020-05-04 DIAGNOSIS — F322 Major depressive disorder, single episode, severe without psychotic features: Secondary | ICD-10-CM | POA: Diagnosis not present

## 2020-05-04 DIAGNOSIS — I712 Thoracic aortic aneurysm, without rupture: Secondary | ICD-10-CM | POA: Diagnosis not present

## 2020-05-04 DIAGNOSIS — I10 Essential (primary) hypertension: Secondary | ICD-10-CM | POA: Diagnosis not present

## 2020-05-04 DIAGNOSIS — E039 Hypothyroidism, unspecified: Secondary | ICD-10-CM | POA: Diagnosis not present

## 2020-05-04 DIAGNOSIS — Z86718 Personal history of other venous thrombosis and embolism: Secondary | ICD-10-CM | POA: Diagnosis not present

## 2020-05-04 DIAGNOSIS — E559 Vitamin D deficiency, unspecified: Secondary | ICD-10-CM | POA: Diagnosis not present

## 2020-05-04 DIAGNOSIS — K219 Gastro-esophageal reflux disease without esophagitis: Secondary | ICD-10-CM | POA: Diagnosis not present

## 2020-05-04 DIAGNOSIS — M5432 Sciatica, left side: Secondary | ICD-10-CM | POA: Diagnosis not present

## 2020-05-04 DIAGNOSIS — Z0000 Encounter for general adult medical examination without abnormal findings: Secondary | ICD-10-CM | POA: Diagnosis not present

## 2020-05-04 DIAGNOSIS — R69 Illness, unspecified: Secondary | ICD-10-CM | POA: Diagnosis not present

## 2020-05-11 DIAGNOSIS — I739 Peripheral vascular disease, unspecified: Secondary | ICD-10-CM | POA: Diagnosis not present

## 2020-05-12 ENCOUNTER — Other Ambulatory Visit: Payer: Self-pay | Admitting: Physician Assistant

## 2020-05-12 DIAGNOSIS — R197 Diarrhea, unspecified: Secondary | ICD-10-CM | POA: Diagnosis not present

## 2020-05-12 DIAGNOSIS — R140 Abdominal distension (gaseous): Secondary | ICD-10-CM | POA: Diagnosis not present

## 2020-05-30 ENCOUNTER — Ambulatory Visit
Admission: RE | Admit: 2020-05-30 | Discharge: 2020-05-30 | Disposition: A | Discharge status: Home / Self Care | Payer: Medicare HMO | Source: Ambulatory Visit | Attending: Physician Assistant | Admitting: Physician Assistant

## 2020-05-30 DIAGNOSIS — K449 Diaphragmatic hernia without obstruction or gangrene: Secondary | ICD-10-CM | POA: Diagnosis not present

## 2020-05-30 DIAGNOSIS — J449 Chronic obstructive pulmonary disease, unspecified: Secondary | ICD-10-CM | POA: Diagnosis not present

## 2020-05-30 DIAGNOSIS — M1990 Unspecified osteoarthritis, unspecified site: Secondary | ICD-10-CM | POA: Diagnosis not present

## 2020-05-30 DIAGNOSIS — N281 Cyst of kidney, acquired: Secondary | ICD-10-CM | POA: Diagnosis not present

## 2020-05-30 DIAGNOSIS — J4521 Mild intermittent asthma with (acute) exacerbation: Secondary | ICD-10-CM | POA: Diagnosis not present

## 2020-05-30 DIAGNOSIS — E782 Mixed hyperlipidemia: Secondary | ICD-10-CM | POA: Diagnosis not present

## 2020-05-30 DIAGNOSIS — E7800 Pure hypercholesterolemia, unspecified: Secondary | ICD-10-CM | POA: Diagnosis not present

## 2020-05-30 DIAGNOSIS — R197 Diarrhea, unspecified: Secondary | ICD-10-CM | POA: Diagnosis not present

## 2020-05-30 DIAGNOSIS — I2510 Atherosclerotic heart disease of native coronary artery without angina pectoris: Secondary | ICD-10-CM | POA: Diagnosis not present

## 2020-05-30 DIAGNOSIS — I10 Essential (primary) hypertension: Secondary | ICD-10-CM | POA: Diagnosis not present

## 2020-05-30 DIAGNOSIS — E039 Hypothyroidism, unspecified: Secondary | ICD-10-CM | POA: Diagnosis not present

## 2020-05-30 DIAGNOSIS — K219 Gastro-esophageal reflux disease without esophagitis: Secondary | ICD-10-CM | POA: Diagnosis not present

## 2020-05-30 DIAGNOSIS — R195 Other fecal abnormalities: Secondary | ICD-10-CM | POA: Diagnosis not present

## 2020-05-30 DIAGNOSIS — R69 Illness, unspecified: Secondary | ICD-10-CM | POA: Diagnosis not present

## 2020-05-30 MED ORDER — IOPAMIDOL (ISOVUE-300) INJECTION 61%
100.0000 mL | Freq: Once | INTRAVENOUS | Status: AC | PRN
  Administered 2020-05-30: 100 mL via INTRAVENOUS

## 2020-06-02 DIAGNOSIS — R197 Diarrhea, unspecified: Secondary | ICD-10-CM | POA: Diagnosis not present

## 2020-06-02 DIAGNOSIS — K760 Fatty (change of) liver, not elsewhere classified: Secondary | ICD-10-CM | POA: Diagnosis not present

## 2020-06-02 DIAGNOSIS — K219 Gastro-esophageal reflux disease without esophagitis: Secondary | ICD-10-CM | POA: Diagnosis not present

## 2020-06-09 DIAGNOSIS — G4733 Obstructive sleep apnea (adult) (pediatric): Secondary | ICD-10-CM | POA: Diagnosis not present

## 2020-06-23 DIAGNOSIS — E039 Hypothyroidism, unspecified: Secondary | ICD-10-CM | POA: Diagnosis not present

## 2020-06-23 DIAGNOSIS — E782 Mixed hyperlipidemia: Secondary | ICD-10-CM | POA: Diagnosis not present

## 2020-06-23 DIAGNOSIS — J4521 Mild intermittent asthma with (acute) exacerbation: Secondary | ICD-10-CM | POA: Diagnosis not present

## 2020-06-23 DIAGNOSIS — I2510 Atherosclerotic heart disease of native coronary artery without angina pectoris: Secondary | ICD-10-CM | POA: Diagnosis not present

## 2020-06-23 DIAGNOSIS — G43109 Migraine with aura, not intractable, without status migrainosus: Secondary | ICD-10-CM | POA: Diagnosis not present

## 2020-06-23 DIAGNOSIS — R69 Illness, unspecified: Secondary | ICD-10-CM | POA: Diagnosis not present

## 2020-06-23 DIAGNOSIS — K219 Gastro-esophageal reflux disease without esophagitis: Secondary | ICD-10-CM | POA: Diagnosis not present

## 2020-06-23 DIAGNOSIS — I10 Essential (primary) hypertension: Secondary | ICD-10-CM | POA: Diagnosis not present

## 2020-06-23 DIAGNOSIS — J449 Chronic obstructive pulmonary disease, unspecified: Secondary | ICD-10-CM | POA: Diagnosis not present

## 2020-07-26 NOTE — Progress Notes (Signed)
Cardiology Office Note    Date:  08/03/2020   ID:  Denise Cline, DOB [birth year], MRN [id number]   PCP:  Josetta Huddle, [place]  Cardiologist:  Sinclair Grooms, MD  Advanced Practice Provider:  No care team member to display Electrophysiologist:  None   [phone]   No chief complaint on file.   History of Present Illness:  Denise Cline is a 80 y.o. 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   Patient complains of left leg swelling. Dopplers normal. Says she needs to lose weight and eats bad but insurance won't pay for dietician. She eats out some, frozen dinners. Only eats once a day.Started walking and getting about 5000 steps daily.     Past Medical History:  Diagnosis Date  . Anxiety   . COPD (chronic obstructive pulmonary disease) ([place])   . DVT (deep venous thrombosis) ([place]) 2014, 2017  . Fibroid   . History of shingles   . Hypertension   . PE (pulmonary thromboembolism) ([place]) 2017  . Thyroid disease    Nodules  . Varicose veins     Past Surgical History:  Procedure Laterality Date  . ABDOMINAL HYSTERECTOMY    . CHOLECYSTECTOMY N/A 11/05/2017   Procedure: LAPAROSCOPIC CHOLECYSTECTOMY WITH INTRAOPERATIVE CHOLANGIOGRAM;  Surgeon: Jovita Kussmaul, MD;  Location: [facility];  Service: General;  Laterality: N/A;  . COLONOSCOPY N/A 10/05/2014   Procedure: COLONOSCOPY;  Surgeon: Ronald Lobo, MD;  Location: [facility];  Service: Endoscopy;  Laterality: N/A;  . ERCP N/A 11/04/2017   Procedure: ENDOSCOPIC RETROGRADE CHOLANGIOPANCREATOGRAPHY (ERCP) Balloon Extraction;  Surgeon: Ronnette Juniper, MD;  Location: [facility];  Service: Gastroenterology;  Laterality: N/A;  . LAPAROSCOPIC  CHOLECYSTECTOMY W/ CHOLANGIOGRAPHY  11/05/2017   Dr. Marlou Starks  . OOPHORECTOMY     One ovary removed  . SPHINCTEROTOMY  11/04/2017   Procedure: SPHINCTEROTOMY;  Surgeon: Ronnette Juniper, MD;  Location: [facility] ENDOSCOPY;  Service: Gastroenterology;;  . TOTAL THYROIDECTOMY      Current Medications: Current Meds  Medication Sig  . acetaminophen (TYLENOL) 650 MG CR tablet Take 1,300 mg by mouth 2 (two) times a day.  Marland Kitchen buPROPion (WELLBUTRIN SR) 150 MG 12 hr tablet Take 150 mg by mouth 2 (two) times daily.   . calcium-vitamin D (OSCAL WITH D) 250-125 MG-UNIT tablet Take 2 tablets by mouth daily.  . hydrochlorothiazide (HYDRODIURIL) 25 MG tablet Take 25 mg by mouth daily.   Marland Kitchen losartan (COZAAR) 100 MG tablet Take 100 mg by mouth daily.  . metoprolol succinate (TOPROL-XL) 25 MG 24 hr tablet Take 1/2 (one-half) tablet by mouth once daily  . Multiple Vitamins-Minerals (CENTRUM SILVER ADULT 50+) TABS Take 1 tablet by mouth daily.  . pantoprazole (PROTONIX) 40 MG tablet Take 1 tablet (40 mg total) by mouth daily.  . polyethylene glycol (MIRALAX / GLYCOLAX) packet Take 17 g by mouth daily.  . rivaroxaban (XARELTO) 20 MG TABS tablet Take 20 mg by mouth daily with supper.  . rosuvastatin (CRESTOR) 20 MG tablet Take 1 tablet (20 mg total) by mouth daily.  . sertraline (ZOLOFT) 50 MG tablet TAKE 1/2 TABLET BY MOUTH ONCE A DAY WITH MEALS FOR 7 DAYS, THEN 1 TAB DAILY  . traMADol (ULTRAM) 50 MG tablet Take  1 tablet (50 mg total) by mouth every 6 (six) hours as needed for moderate pain.  Marland Kitchen triamcinolone (KENALOG) 0.025 % cream Apply 1 application topically daily as needed for itching.     Allergies:   Penicillins, Sulfa antibiotics, Ace inhibitors, Atorvastatin, Citrus, Oxycodone, Sulfamethoxazole-trimethoprim, and Vicodin [hydrocodone-acetaminophen]   Social History   Socioeconomic History  . Marital status: Single    Spouse name: Not on file  . Number of children: 3  . Years of education: Not on file  . Highest  education level: Not on file  Occupational History  . Not on file  Tobacco Use  . Smoking status: Former Smoker    Packs/day: 2.00    Years: 40.00    Pack years: 80.00    Types: Cigarettes    Quit date: 05/30/2008    Years since quitting: 12.1  . Smokeless tobacco: Former Systems developer    Quit date: 05/12/2008  . Tobacco comment: Counseled to remain smoke free  Substance and Sexual Activity  . Alcohol use: No    Alcohol/week: 0.0 standard drinks    Comment: Rare  . Drug use: No  . Sexual activity: Not Currently    Birth control/protection: Surgical  Other Topics Concern  . Not on file  Social History Narrative  . Not on file   Social Determinants of Health   Financial Resource Strain: Not on file  Food Insecurity: Not on file  Transportation Needs: Not on file  Physical Activity: Not on file  Stress: Not on file  Social Connections: Not on file     Family History:  The patient's family history includes Crohn's disease in her mother; Diabetes in her sister; Hypertension in her brother and sister; Stroke in her father.   ROS:   Please see the history of present illness.    ROS All other systems reviewed and are negative.   PHYSICAL EXAM:   VS:  BP 120/74   Pulse 67   Ht 5\' 6"  (1.676 m)   Wt 204 lb 9.6 oz (92.8 kg)   SpO2 95%   BMI 33.02 kg/m   Physical Exam  GEN: Obese, in no acute distress  Neck: no JVD, carotid bruits, or masses Cardiac:RRR; no murmurs, rubs, or gallops  Respiratory:  clear to auscultation bilaterally, normal work of breathing GI: soft, nontender, nondistended, + BS Ext: without cyanosis, clubbing, or edema, Good distal pulses bilaterally Neuro:  Alert and Oriented x 3 Psych: euthymic mood, full affect  Wt Readings from Last 3 Encounters:  08/03/20 204 lb 9.6 oz (92.8 kg)  12/30/19 208 lb (94.3 kg)  12/18/19 206 lb 6.4 oz (93.6 kg)      Studies/Labs Reviewed:   EKG:  EKG is not ordered today.   Recent Labs: No results found for requested  labs within last 8760 hours.   Lipid Panel    Component Value Date/Time   CHOL 204 (H) 12/25/2017 0816   TRIG 96 12/25/2017 0816   HDL 60 12/25/2017 0816   CHOLHDL 3.4 12/25/2017 0816   CHOLHDL 3.7 05/15/2009 0555   VLDL 22 05/15/2009 0555   LDLCALC 125 (H) 12/25/2017 0816    Additional studies/ records that were reviewed today include:   CTAngio chest 12/2019 IMPRESSION: 1. Grossly stable 4.3 cm ascending thoracic aortic aneurysm. No dissection is noted. Recommend annual imaging followup by CTA or MRA. This recommendation follows 2010 ACCF/AHA/AATS/ACR/ASA/SCA/SCAI/SIR/STS/SVM Guidelines for the Diagnosis and Management of Patients with Thoracic Aortic Disease. Circulation. 2010; 121: J500-X381. Aortic aneurysm NOS (  ICD10-I71.9). 2. Stable 2.8 cm right thyroid nodule is noted. Recommend thyroid US. (Ref: J Am Coll Radiol. 2015 Feb;12(2): 143-50). 3. Stable bilateral axillary adenopathy is noted.     Electronically Signed   By: Marijo Conception M.D.   On: 12/30/2019 13:25        CT angiogram of aorta October 2020: IMPRESSION: 1. Stable 4.3 cm ascending Aortic aneurysm NOS (FMB84-Y65.9) without complicating features. Recommend annual imaging followup by CTA or MRA. This recommendation follows 2010 ACCF/AHA/AATS/ACR/ASA/SCA/SCAI/SIR/STS/SVM Guidelines for the Diagnosis and Management of Patients with Thoracic Aortic Disease. Circulation. 2010; 121: D357-S177 2. Stable right axillary and subpectoral adenopathy since 01/16/2016, favoring inflammatory/reactive process over neoplasm. 3. Aortic Atherosclerosis (ICD10-I70.0).      Risk Assessment/Calculations:         ASSESSMENT:    1. Coronary artery calcification seen on CAT scan   2. Lower extremity edema   3. Essential hypertension   4. Thoracic aortic aneurysm without rupture ([place])   5. Hyperlipidemia, unspecified hyperlipidemia type   6. OSA (obstructive sleep apnea)   7. Morbid obesity ([place])       PLAN:  In order of problems listed above:  Coronary artery calcification seen on CT scan primary prevention with lipid and blood pressure control-no angina. Increase exercise 150 min/week  Left LE edema-dopplers negative. 2 gm sodium diet  Essential hypertension well controlled  History of acute DVT and PE on Xarelto labs stable in Feb  Thoracic aortic aneurysm followed by Dr. Cyndia Bent stable 12/2019  Hyperlipidemia LDL 123 04/2020 now on crestor. Need to have repeat FLP.  OSA restarted on CPAP  Obesity-refer to weight loss center.   Shared Decision Making/Informed Consent        Medication Adjustments/Labs and Tests Ordered: Current medicines are reviewed at length with the patient today.  Concerns regarding medicines are outlined above.  Medication changes, Labs and Tests ordered today are listed in the Patient Instructions below. There are no Patient Instructions on file for this visit.   Sumner Boast, PA-C  08/03/2020 10:57 AM    [facility] [address], [place], [place]  [address] Phone: [phone]; Fax: [phone]

## 2020-07-28 DIAGNOSIS — E039 Hypothyroidism, unspecified: Secondary | ICD-10-CM | POA: Diagnosis not present

## 2020-07-28 DIAGNOSIS — R69 Illness, unspecified: Secondary | ICD-10-CM | POA: Diagnosis not present

## 2020-07-28 DIAGNOSIS — J449 Chronic obstructive pulmonary disease, unspecified: Secondary | ICD-10-CM | POA: Diagnosis not present

## 2020-07-28 DIAGNOSIS — E7800 Pure hypercholesterolemia, unspecified: Secondary | ICD-10-CM | POA: Diagnosis not present

## 2020-07-28 DIAGNOSIS — I2510 Atherosclerotic heart disease of native coronary artery without angina pectoris: Secondary | ICD-10-CM | POA: Diagnosis not present

## 2020-07-28 DIAGNOSIS — M1990 Unspecified osteoarthritis, unspecified site: Secondary | ICD-10-CM | POA: Diagnosis not present

## 2020-07-28 DIAGNOSIS — I10 Essential (primary) hypertension: Secondary | ICD-10-CM | POA: Diagnosis not present

## 2020-07-28 DIAGNOSIS — E782 Mixed hyperlipidemia: Secondary | ICD-10-CM | POA: Diagnosis not present

## 2020-07-28 DIAGNOSIS — J4521 Mild intermittent asthma with (acute) exacerbation: Secondary | ICD-10-CM | POA: Diagnosis not present

## 2020-07-28 DIAGNOSIS — K219 Gastro-esophageal reflux disease without esophagitis: Secondary | ICD-10-CM | POA: Diagnosis not present

## 2020-08-03 ENCOUNTER — Other Ambulatory Visit: Payer: Self-pay

## 2020-08-03 ENCOUNTER — Encounter: Payer: Self-pay | Admitting: Physician Assistant

## 2020-08-03 ENCOUNTER — Ambulatory Visit: Payer: Medicare HMO | Admitting: Physician Assistant

## 2020-08-03 VITALS — BP 120/74 | HR 67 | Ht 66.0 in | Wt 204.6 lb

## 2020-08-03 DIAGNOSIS — I2510 Atherosclerotic heart disease of native coronary artery without angina pectoris: Secondary | ICD-10-CM

## 2020-08-03 DIAGNOSIS — G4733 Obstructive sleep apnea (adult) (pediatric): Secondary | ICD-10-CM

## 2020-08-03 DIAGNOSIS — R600 Localized edema: Secondary | ICD-10-CM | POA: Diagnosis not present

## 2020-08-03 DIAGNOSIS — I712 Thoracic aortic aneurysm, without rupture, unspecified: Secondary | ICD-10-CM

## 2020-08-03 DIAGNOSIS — I10 Essential (primary) hypertension: Secondary | ICD-10-CM

## 2020-08-03 DIAGNOSIS — E785 Hyperlipidemia, unspecified: Secondary | ICD-10-CM

## 2020-08-03 NOTE — Patient Instructions (Signed)
Medication Instructions:  Your physician recommends that you continue on your current medications as directed. Please refer to the Current Medication list given to you today.  *If you need a refill on your cardiac medications before your next appointment, please call your pharmacy*   Lab Work: You have been given an order to have a Fasting Lipid Panel done  If you have labs (blood work) drawn today and your tests are completely normal, you will receive your results only by: Marland Kitchen MyChart Message (if you have MyChart) OR . A paper copy in the mail If you have any lab test that is abnormal or we need to change your treatment, we will call you to review the results.   Follow-Up: At [facility], you and your health needs are our priority.  As part of our continuing mission to provide you with exceptional heart care, we have created designated Provider Care Teams.  These Care Teams include your primary Cardiologist (physician) and Advanced Practice Providers (APPs -  Physician Assistants and Nurse Practitioners) who all work together to provide you with the care you need, when you need it.  Your next appointment:   1 year(s)  The format for your next appointment:   In Person  Provider:   You may see Sinclair Grooms, MD or one of the following Advanced Practice Providers on your designated Care Team:    Kathyrn Drown, NP    Other Instructions Your provider recommends that you maintain 150 minutes per week of moderate aerobic activity.  Two Gram Sodium Diet 2000 mg  What is Sodium? Sodium is a mineral found naturally in many foods. The most significant source of sodium in the diet is table salt, which is about 40% sodium.  Processed, convenience, and preserved foods also contain a large amount of sodium.  The body needs only 500 mg of sodium daily to function,  A normal diet provides more than enough sodium even if you do not use salt.  Why Limit Sodium? A build up of sodium in the  body can cause thirst, increased blood pressure, shortness of breath, and water retention.  Decreasing sodium in the diet can reduce edema and risk of heart attack or stroke associated with high blood pressure.  Keep in mind that there are many other factors involved in these health problems.  Heredity, obesity, lack of exercise, cigarette smoking, stress and what you eat all play a role.  General Guidelines:  Do not add salt at the table or in cooking.  One teaspoon of salt contains over 2 grams of sodium.  Read food labels  Avoid processed and convenience foods  Ask your dietitian before eating any foods not dicussed in the menu planning guidelines  Consult your physician if you wish to use a salt substitute or a sodium containing medication such as antacids.  Limit milk and milk products to 16 oz (2 cups) per day.  Shopping Hints:  READ LABELS!! "Dietetic" does not necessarily mean low sodium.  Salt and other sodium ingredients are often added to foods during processing.   Menu Planning Guidelines Food Group Choose More Often Avoid  Beverages (see also the milk group All fruit juices, low-sodium, salt-free vegetables juices, low-sodium carbonated beverages Regular vegetable or tomato juices, commercially softened water used for drinking or cooking  Breads and Cereals Enriched white, wheat, rye and pumpernickel bread, hard rolls and dinner rolls; muffins, cornbread and waffles; most dry cereals, cooked cereal without added salt; unsalted crackers and  breadsticks; low sodium or homemade bread crumbs Bread, rolls and crackers with salted tops; quick breads; instant hot cereals; pancakes; commercial bread stuffing; self-rising flower and biscuit mixes; regular bread crumbs or cracker crumbs  Desserts and Sweets Desserts and sweets mad with mild should be within allowance Instant pudding mixes and cake mixes  Fats Butter or margarine; vegetable oils; unsalted salad dressings, regular salad  dressings limited to 1 Tbs; light, sour and heavy cream Regular salad dressings containing bacon fat, bacon bits, and salt pork; snack dips made with instant soup mixes or processed cheese; salted nuts  Fruits Most fresh, frozen and canned fruits Fruits processed with salt or sodium-containing ingredient (some dried fruits are processed with sodium sulfites        Vegetables Fresh, frozen vegetables and low- sodium canned vegetables Regular canned vegetables, sauerkraut, pickled vegetables, and others prepared in brine; frozen vegetables in sauces; vegetables seasoned with ham, bacon or salt pork  Condiments, Sauces, Miscellaneous  Salt substitute with physician's approval; pepper, herbs, spices; vinegar, lemon or lime juice; hot pepper sauce; garlic powder, onion powder, low sodium soy sauce (1 Tbs.); low sodium condiments (ketchup, chili sauce, mustard) in limited amounts (1 tsp.) fresh ground horseradish; unsalted tortilla chips, pretzels, potato chips, popcorn, salsa (1/4 cup) Any seasoning made with salt including garlic salt, celery salt, onion salt, and seasoned salt; sea salt, rock salt, kosher salt; meat tenderizers; monosodium glutamate; mustard, regular soy sauce, barbecue, sauce, chili sauce, teriyaki sauce, steak sauce, Worcestershire sauce, and most flavored vinegars; canned gravy and mixes; regular condiments; salted snack foods, olives, picles, relish, horseradish sauce, catsup   Food preparation: Try these seasonings Meats:    Pork Sage, onion Serve with applesauce  Chicken Poultry seasoning, thyme, parsley Serve with cranberry sauce  Lamb Curry powder, rosemary, garlic, thyme Serve with mint sauce or jelly  Veal Marjoram, basil Serve with current jelly, cranberry sauce  Beef Pepper, bay leaf Serve with dry mustard, unsalted chive butter  Fish [place] leaf, dill Serve with unsalted lemon butter, unsalted parsley butter  Vegetables:    Asparagus Lemon juice   Broccoli Lemon juice    Carrots Mustard dressing parsley, mint, nutmeg, glazed with unsalted butter and sugar   Green beans Marjoram, lemon juice, nutmeg,dill seed   Tomatoes Basil, marjoram, onion   Spice /blend for [employer]" 4 tsp ground thyme 1 tsp ground sage 3 tsp ground rosemary 4 tsp ground marjoram   Test your knowledge 1. A product that says "Salt Free" may still contain sodium. True or False 2. Garlic Powder and Hot Pepper Sauce an be used as alternative seasonings.True or False 3. Processed foods have more sodium than fresh foods.  True or False 4. Canned Vegetables have less sodium than froze True or False  WAYS TO DECREASE YOUR SODIUM INTAKE 1. Avoid the use of added salt in cooking and at the table.  Table salt (and other prepared seasonings which contain salt) is probably one of the greatest sources of sodium in the diet.  Unsalted foods can gain flavor from the sweet, sour, and butter taste sensations of herbs and spices.  Instead of using salt for seasoning, try the following seasonings with the foods listed.  Remember: how you use them to enhance natural food flavors is limited only by your creativity... Allspice-Meat, fish, eggs, fruit, peas, red and yellow vegetables Almond Extract-Fruit baked goods Anise Seed-Sweet breads, fruit, carrots, beets, cottage cheese, cookies (tastes like licorice) Basil-Meat, fish, eggs, vegetables, rice, vegetables salads, soups,  sauces Bay Leaf-Meat, fish, stews, poultry Burnet-Salad, vegetables (cucumber-like flavor) Caraway Seed-Bread, cookies, cottage cheese, meat, vegetables, cheese, rice Cardamon-Baked goods, fruit, soups Celery Powder or seed-Salads, salad dressings, sauces, meatloaf, soup, bread.Do not use  celery salt Chervil-Meats, salads, fish, eggs, vegetables, cottage cheese (parsley-like flavor) Chili Power-Meatloaf, chicken cheese, corn, eggplant, egg dishes Chives-Salads cottage cheese, egg dishes, soups, vegetables, sauces Cilantro-Salsa,  casseroles Cinnamon-Baked goods, fruit, pork, lamb, chicken, carrots Cloves-Fruit, baked goods, fish, pot roast, green beans, beets, carrots Coriander-Pastry, cookies, meat, salads, cheese (lemon-orange flavor) Cumin-Meatloaf, fish,cheese, eggs, cabbage,fruit pie (caraway flavor) [employer], fruit, eggs, fish, poultry, cottage cheese, vegetables Dill Seed-Meat, cottage cheese, poultry, vegetables, fish, salads, bread Fennel Seed-Bread, cookies, apples, pork, eggs, fish, beets, cabbage, cheese, Licorice-like flavor Garlic-(buds or powder) Salads, meat, poultry, fish, bread, butter, vegetables, potatoes.Do not  use garlic salt Ginger-Fruit, vegetables, baked goods, meat, fish, poultry Horseradish Root-Meet, vegetables, butter Lemon Juice or Extract-Vegetables, fruit, tea, baked goods, fish salads Mace-Baked goods fruit, vegetables, fish, poultry (taste like nutmeg) Maple Extract-Syrups Marjoram-Meat, chicken, fish, vegetables, breads, green salads (taste like Sage) Mint-Tea, lamb, sherbet, vegetables, desserts, carrots, cabbage Mustard, Dry or Seed-Cheese, eggs, meats, vegetables, poultry Nutmeg-Baked goods, fruit, chicken, eggs, vegetables, desserts Onion Powder-Meat, fish, poultry, vegetables, cheese, eggs, bread, rice salads (Do not use   Onion salt) Orange Extract-Desserts, baked goods Oregano-Pasta, eggs, cheese, onions, pork, lamb, fish, chicken, vegetables, green salads Paprika-Meat, fish, poultry, eggs, cheese, vegetables Parsley Flakes-Butter, vegetables, meat fish, poultry, eggs, bread, salads (certain forms may   Contain sodium Pepper-Meat fish, poultry, vegetables, eggs Peppermint Extract-Desserts, baked goods Poppy Seed-Eggs, bread, cheese, fruit dressings, baked goods, noodles, vegetables, cottage  [employer], poultry, meat, fish, cauliflower, turnips,eggs bread Saffron-Rice, bread, veal, chicken, fish, eggs Sage-Meat, fish,  poultry, onions, eggplant, tomateos, pork, stews Savory-Eggs, salads, poultry, meat, rice, vegetables, soups, pork Tarragon-Meat, poultry, fish, eggs, butter, vegetables (licorice-like flavor)  Thyme-Meat, poultry, fish, eggs, vegetables, (clover-like flavor), sauces, soups Tumeric-Salads, butter, eggs, fish, rice, vegetables (saffron-like flavor) Vanilla Extract-Baked goods, candy Vinegar-Salads, vegetables, meat marinades Walnut Extract-baked goods, candy  2. Choose your Foods Wisely   The following is a list of foods to avoid which are high in sodium:  Meats-Avoid all smoked, canned, salt cured, dried and kosher meat and fish as well as Anchovies   [employer] meats:Bologna, Liverwurst, Pastrami Canned meat or fish  Marinated herring Caviar    Pepperoni Corned Beef   Pizza Dried chipped beef  Salami Frozen breaded fish or meat Salt pork Frankfurters or hot dogs  Sardines Gefilte fish   Sausage Ham (boiled ham, Proscuitto Smoked butt    spiced ham)   Spam      TV Dinners Vegetables Canned vegetables (Regular) Relish Canned mushrooms  Sauerkraut Olives    Tomato juice Pickles  Bakery and Dessert Products Canned puddings  Cream pies Cheesecake   Decorated cakes Cookies  Beverages/Juices Tomato juice, regular  Gatorade   V-8 vegetable juice, regular  Breads and Cereals Biscuit mixes   Salted potato chips, corn chips, pretzels Bread stuffing mixes  Salted crackers and rolls Pancake and waffle mixes Self-rising flour  Seasonings Accent    Meat sauces Barbecue sauce  Meat tenderizer Catsup    Monosodium glutamate (MSG) Celery salt   Onion salt Chili sauce   Prepared mustard Garlic salt   Salt, seasoned salt, sea salt Gravy mixes   Soy sauce Horseradish   Steak sauce Ketchup   Tartar sauce Lite salt    Teriyaki  sauce Marinade mixes   Worcestershire sauce  Others Baking powder   Cocoa and cocoa mixes Baking soda   Commercial casserole  mixes Candy-caramels, chocolate  Dehydrated soups    Bars, fudge,nougats  Instant rice and pasta mixes Canned broth or soup  Maraschino cherries Cheese, aged and processed cheese and cheese spreads  Learning Assessment Quiz  Indicated T (for True) or F (for False) for each of the following statements:  1. _____ Fresh fruits and vegetables and unprocessed grains are generally low in sodium 2. _____ Water may contain a considerable amount of sodium, depending on the source 3. _____ You can always tell if a food is high in sodium by tasting it 4. _____ Certain laxatives my be high in sodium and should be avoided unless prescribed   by a physician or pharmacist 5. _____ Salt substitutes may be used freely by anyone on a sodium restricted diet 6. _____ Sodium is present in table salt, food additives and as a natural component of   most foods 7. _____ Table salt is approximately 90% sodium 8. _____ Limiting sodium intake may help prevent excess fluid accumulation in the body 9. _____ On a sodium-restricted diet, seasonings such as bouillon soy sauce, and    cooking wine should be used in place of table salt 10. _____ On an ingredient list, a product which lists monosodium glutamate as the first   ingredient is an appropriate food to include on a low sodium diet  Circle the best answer(s) to the following statements (Hint: there may be more than one correct answer)  11. On a low-sodium diet, some acceptable snack items are:    A. Olives  F. Bean dip   K. Grapefruit juice    B. Salted Pretzels G. Commercial Popcorn   L. Canned peaches    C. Carrot Sticks  H. Bouillon   M. Unsalted nuts   D. [place] fries  I. Peanut butter crackers N. Salami   E. Sweet pickles J. Tomato Juice   O. Pizza  12.  Seasonings that may be used freely on a reduced - sodium diet include   A. Lemon wedges F.Monosodium glutamate K. Celery seed    B.Soysauce   G. Pepper   L. Mustard powder   C. Sea salt  H.  Cooking wine  M. Onion flakes   D. Vinegar  E. Prepared horseradish N. Salsa   E. Sage   J. Worcestershire sauce  O. Chutney

## 2020-08-11 DIAGNOSIS — G4733 Obstructive sleep apnea (adult) (pediatric): Secondary | ICD-10-CM | POA: Diagnosis not present

## 2020-08-11 DIAGNOSIS — I2510 Atherosclerotic heart disease of native coronary artery without angina pectoris: Secondary | ICD-10-CM | POA: Diagnosis not present

## 2020-08-23 ENCOUNTER — Ambulatory Visit: Payer: Medicare HMO | Admitting: Registered"

## 2020-08-26 DIAGNOSIS — K219 Gastro-esophageal reflux disease without esophagitis: Secondary | ICD-10-CM | POA: Diagnosis not present

## 2020-08-26 DIAGNOSIS — R69 Illness, unspecified: Secondary | ICD-10-CM | POA: Diagnosis not present

## 2020-08-26 DIAGNOSIS — J4521 Mild intermittent asthma with (acute) exacerbation: Secondary | ICD-10-CM | POA: Diagnosis not present

## 2020-08-26 DIAGNOSIS — E7800 Pure hypercholesterolemia, unspecified: Secondary | ICD-10-CM | POA: Diagnosis not present

## 2020-08-26 DIAGNOSIS — E039 Hypothyroidism, unspecified: Secondary | ICD-10-CM | POA: Diagnosis not present

## 2020-08-26 DIAGNOSIS — J449 Chronic obstructive pulmonary disease, unspecified: Secondary | ICD-10-CM | POA: Diagnosis not present

## 2020-08-26 DIAGNOSIS — M1990 Unspecified osteoarthritis, unspecified site: Secondary | ICD-10-CM | POA: Diagnosis not present

## 2020-08-26 DIAGNOSIS — I10 Essential (primary) hypertension: Secondary | ICD-10-CM | POA: Diagnosis not present

## 2020-08-26 DIAGNOSIS — I2510 Atherosclerotic heart disease of native coronary artery without angina pectoris: Secondary | ICD-10-CM | POA: Diagnosis not present

## 2020-08-26 DIAGNOSIS — E782 Mixed hyperlipidemia: Secondary | ICD-10-CM | POA: Diagnosis not present

## 2020-09-28 DIAGNOSIS — E782 Mixed hyperlipidemia: Secondary | ICD-10-CM | POA: Diagnosis not present

## 2020-09-28 DIAGNOSIS — I10 Essential (primary) hypertension: Secondary | ICD-10-CM | POA: Diagnosis not present

## 2020-09-28 DIAGNOSIS — E039 Hypothyroidism, unspecified: Secondary | ICD-10-CM | POA: Diagnosis not present

## 2020-09-28 DIAGNOSIS — J449 Chronic obstructive pulmonary disease, unspecified: Secondary | ICD-10-CM | POA: Diagnosis not present

## 2020-09-28 DIAGNOSIS — G43109 Migraine with aura, not intractable, without status migrainosus: Secondary | ICD-10-CM | POA: Diagnosis not present

## 2020-09-28 DIAGNOSIS — J4521 Mild intermittent asthma with (acute) exacerbation: Secondary | ICD-10-CM | POA: Diagnosis not present

## 2020-09-28 DIAGNOSIS — R69 Illness, unspecified: Secondary | ICD-10-CM | POA: Diagnosis not present

## 2020-09-28 DIAGNOSIS — I2510 Atherosclerotic heart disease of native coronary artery without angina pectoris: Secondary | ICD-10-CM | POA: Diagnosis not present

## 2020-09-28 DIAGNOSIS — K219 Gastro-esophageal reflux disease without esophagitis: Secondary | ICD-10-CM | POA: Diagnosis not present

## 2020-09-28 DIAGNOSIS — N1831 Chronic kidney disease, stage 3a: Secondary | ICD-10-CM | POA: Diagnosis not present

## 2020-10-28 DIAGNOSIS — I2510 Atherosclerotic heart disease of native coronary artery without angina pectoris: Secondary | ICD-10-CM | POA: Diagnosis not present

## 2020-10-28 DIAGNOSIS — R69 Illness, unspecified: Secondary | ICD-10-CM | POA: Diagnosis not present

## 2020-10-28 DIAGNOSIS — K219 Gastro-esophageal reflux disease without esophagitis: Secondary | ICD-10-CM | POA: Diagnosis not present

## 2020-10-28 DIAGNOSIS — G43109 Migraine with aura, not intractable, without status migrainosus: Secondary | ICD-10-CM | POA: Diagnosis not present

## 2020-10-28 DIAGNOSIS — J449 Chronic obstructive pulmonary disease, unspecified: Secondary | ICD-10-CM | POA: Diagnosis not present

## 2020-10-28 DIAGNOSIS — N1831 Chronic kidney disease, stage 3a: Secondary | ICD-10-CM | POA: Diagnosis not present

## 2020-10-28 DIAGNOSIS — I10 Essential (primary) hypertension: Secondary | ICD-10-CM | POA: Diagnosis not present

## 2020-10-28 DIAGNOSIS — E7800 Pure hypercholesterolemia, unspecified: Secondary | ICD-10-CM | POA: Diagnosis not present

## 2020-10-28 DIAGNOSIS — E782 Mixed hyperlipidemia: Secondary | ICD-10-CM | POA: Diagnosis not present

## 2020-10-28 DIAGNOSIS — E039 Hypothyroidism, unspecified: Secondary | ICD-10-CM | POA: Diagnosis not present

## 2020-11-02 ENCOUNTER — Encounter (INDEPENDENT_AMBULATORY_CARE_PROVIDER_SITE_OTHER): Payer: Self-pay | Admitting: Family Medicine

## 2020-11-02 ENCOUNTER — Ambulatory Visit (INDEPENDENT_AMBULATORY_CARE_PROVIDER_SITE_OTHER): Payer: Medicare HMO | Admitting: Family Medicine

## 2020-11-02 ENCOUNTER — Other Ambulatory Visit: Payer: Self-pay

## 2020-11-02 VITALS — BP 117/70 | HR 58 | Temp 97.9°F | Ht 66.0 in | Wt 196.0 lb

## 2020-11-02 DIAGNOSIS — K5900 Constipation, unspecified: Secondary | ICD-10-CM | POA: Diagnosis not present

## 2020-11-02 DIAGNOSIS — F339 Major depressive disorder, recurrent, unspecified: Secondary | ICD-10-CM

## 2020-11-02 DIAGNOSIS — R0602 Shortness of breath: Secondary | ICD-10-CM | POA: Diagnosis not present

## 2020-11-02 DIAGNOSIS — I10 Essential (primary) hypertension: Secondary | ICD-10-CM | POA: Diagnosis not present

## 2020-11-02 DIAGNOSIS — E669 Obesity, unspecified: Secondary | ICD-10-CM | POA: Diagnosis not present

## 2020-11-02 DIAGNOSIS — Z6831 Body mass index (BMI) 31.0-31.9, adult: Secondary | ICD-10-CM

## 2020-11-02 DIAGNOSIS — E782 Mixed hyperlipidemia: Secondary | ICD-10-CM | POA: Diagnosis not present

## 2020-11-02 DIAGNOSIS — R5383 Other fatigue: Secondary | ICD-10-CM | POA: Diagnosis not present

## 2020-11-02 DIAGNOSIS — E538 Deficiency of other specified B group vitamins: Secondary | ICD-10-CM

## 2020-11-02 DIAGNOSIS — Z0289 Encounter for other administrative examinations: Secondary | ICD-10-CM

## 2020-11-02 DIAGNOSIS — R739 Hyperglycemia, unspecified: Secondary | ICD-10-CM | POA: Diagnosis not present

## 2020-11-02 DIAGNOSIS — R69 Illness, unspecified: Secondary | ICD-10-CM | POA: Diagnosis not present

## 2020-11-02 DIAGNOSIS — E559 Vitamin D deficiency, unspecified: Secondary | ICD-10-CM | POA: Diagnosis not present
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
## 2020-11-03 LAB — COMPREHENSIVE METABOLIC PANEL
ALT: 19 IU/L (ref 0–32)
AST: 17 IU/L (ref 0–40)
Albumin/Globulin Ratio: 1.6 (ref 1.2–2.2)
Albumin: 4.3 g/dL (ref 3.6–4.6)
Alkaline Phosphatase: 48 IU/L (ref 44–121)
BUN/Creatinine Ratio: 18 (ref 12–28)
BUN: 20 mg/dL (ref 8–27)
Bilirubin Total: 0.5 mg/dL (ref 0.0–1.2)
CO2: 24 mmol/L (ref 20–29)
Calcium: 8.8 mg/dL (ref 8.7–10.3)
Chloride: 99 mmol/L (ref 96–106)
Creatinine, Ser: 1.12 mg/dL — ABNORMAL HIGH (ref 0.57–1.00)
Globulin, Total: 2.7 g/dL (ref 1.5–4.5)
Glucose: 91 mg/dL (ref 65–99)
Potassium: 4.1 mmol/L (ref 3.5–5.2)
Sodium: 138 mmol/L (ref 134–144)
Total Protein: 7 g/dL (ref 6.0–8.5)
eGFR: 49 mL/min/{1.73_m2} — ABNORMAL LOW (ref >59)

## 2020-11-03 LAB — CBC WITH DIFFERENTIAL/PLATELET
Basophils Absolute: 0 10*3/uL (ref 0.0–0.2)
Basos: 1 %
EOS (ABSOLUTE): 0.2 10*3/uL (ref 0.0–0.4)
Eos: 5 %
Hematocrit: 36.8 % (ref 34.0–46.6)
Hemoglobin: 12.3 g/dL (ref 11.1–15.9)
Immature Grans (Abs): 0 10*3/uL (ref 0.0–0.1)
Immature Granulocytes: 0 %
Lymphocytes Absolute: 1.7 10*3/uL (ref 0.7–3.1)
Lymphs: 33 %
MCH: 31.3 pg (ref 26.6–33.0)
MCHC: 33.4 g/dL (ref 31.5–35.7)
MCV: 94 fL (ref 79–97)
Monocytes Absolute: 0.3 10*3/uL (ref 0.1–0.9)
Monocytes: 7 %
Neutrophils Absolute: 2.8 10*3/uL (ref 1.4–7.0)
Neutrophils: 54 %
Platelets: 219 10*3/uL (ref 150–450)
RBC: 3.93 x10E6/uL (ref 3.77–5.28)
RDW: 13.7 % (ref 11.7–15.4)
WBC: 5.1 10*3/uL (ref 3.4–10.8)

## 2020-11-03 LAB — HEMOGLOBIN A1C
Est. average glucose Bld gHb Est-mCnc: 128 mg/dL
Hgb A1c MFr Bld: 6.1 % — ABNORMAL HIGH (ref 4.8–5.6)

## 2020-11-03 LAB — THYROID PANEL WITH TSH
Free Thyroxine Index: 1.9 (ref 1.2–4.9)
T3 Uptake Ratio: 29 % (ref 24–39)
T4, Total: 6.7 ug/dL (ref 4.5–12.0)
TSH: 2.65 u[IU]/mL (ref 0.450–4.500)

## 2020-11-03 LAB — VITAMIN D 25 HYDROXY (VIT D DEFICIENCY, FRACTURES): Vit D, 25-Hydroxy: 27.6 ng/mL — ABNORMAL LOW (ref 30.0–100.0)

## 2020-11-03 LAB — LIPID PANEL WITH LDL/HDL RATIO
Cholesterol, Total: 142 mg/dL (ref 100–199)
HDL: 55 mg/dL (ref >39)
LDL Chol Calc (NIH): 63 mg/dL (ref 0–99)
LDL/HDL Ratio: 1.1 ratio (ref 0.0–3.2)
Triglycerides: 137 mg/dL (ref 0–149)
VLDL Cholesterol Cal: 24 mg/dL (ref 5–40)

## 2020-11-03 LAB — INSULIN, RANDOM: INSULIN: 18.8 u[IU]/mL (ref 2.6–24.9)

## 2020-11-03 LAB — VITAMIN B12: Vitamin B-12: 563 pg/mL (ref 232–1245)

## 2020-11-16 ENCOUNTER — Other Ambulatory Visit: Payer: Self-pay

## 2020-11-16 ENCOUNTER — Encounter (INDEPENDENT_AMBULATORY_CARE_PROVIDER_SITE_OTHER): Payer: Self-pay | Admitting: Family Medicine

## 2020-11-16 ENCOUNTER — Ambulatory Visit (INDEPENDENT_AMBULATORY_CARE_PROVIDER_SITE_OTHER): Payer: Medicare HMO | Admitting: Family Medicine

## 2020-11-16 VITALS — BP 109/73 | HR 83 | Temp 98.0°F | Ht 66.0 in | Wt 196.0 lb

## 2020-11-16 DIAGNOSIS — R7303 Prediabetes: Secondary | ICD-10-CM

## 2020-11-16 DIAGNOSIS — Z6831 Body mass index (BMI) 31.0-31.9, adult: Secondary | ICD-10-CM | POA: Diagnosis not present

## 2020-11-16 DIAGNOSIS — E669 Obesity, unspecified: Secondary | ICD-10-CM | POA: Diagnosis not present

## 2020-11-16 DIAGNOSIS — E559 Vitamin D deficiency, unspecified: Secondary | ICD-10-CM | POA: Diagnosis not present

## 2020-11-16 DIAGNOSIS — I10 Essential (primary) hypertension: Secondary | ICD-10-CM

## 2020-11-16 DIAGNOSIS — R7989 Other specified abnormal findings of blood chemistry: Secondary | ICD-10-CM | POA: Diagnosis not present

## 2020-11-16 MED ORDER — VITAMIN D (ERGOCALCIFEROL) 1.25 MG (50000 UNIT) PO CAPS: 50000.0000 [IU] | ORAL_CAPSULE | ORAL | 0 refills | Status: DC

## 2020-11-16 NOTE — Progress Notes (Signed)
Chief Complaint:   OBESITY Denise Cline is here to discuss her progress with her obesity treatment plan along with follow-up of her obesity related diagnoses. Denise Cline is on the Category 2 Plan and states she is following her eating plan approximately 90% of the time. Denise Cline states she is not currently exercising.  Today's visit was #: 2 Starting weight: 196 lbs Starting date: 11/02/2020 Today's weight: 196 lbs Today's date: 11/16/2020 Total lbs lost to date: 0 Total lbs lost since last in-office visit: 0  Interim History: Denise Cline had a stressful 2 weeks as she lost her phone for an entire week. She is still working on cleaning her bedroom and closet out. Pt finished her Corn Flakes, milk, and banana. 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 She is still enjoying grapes and 2 meals per day consistently.  Subjective:   1. Essential hypertension BP is on the lower range of normal. She is on Cozaar, HCTZ, and Toprol XL.  2. Elevated serum creatinine Her serum creatinine is 1.12 (previously <1.00).  3. Vitamin D deficiency Denise Cline is on OTC Vit D 1,000 IU daily and she reports fatigue. (Previous A1c 6.0)  4. Pre-diabetes Her A1c is 6.1 and insulin level is 18.8.  Assessment/Plan:   1. Essential hypertension Denise Cline is working on healthy weight loss and exercise to improve blood pressure control. We will watch for signs of hypotension as she continues her lifestyle modifications. Follow up BP at next appt.  2. Elevated serum creatinine Repeat labs in 3 months.  3. Vitamin D deficiency Low Vitamin D level contributes to fatigue and are associated with obesity, breast, and colon cancer. She agrees to discontinue OTC Vit D and start prescription Vitamin D 50,000 IU every week and will follow-up for routine testing of Vitamin D, at least 2-3 times per year to avoid over-replacement.  Start- Vitamin D, Ergocalciferol, (DRISDOL) 1.25 MG (50000 UNIT) CAPS capsule; Take 1 capsule (50,000 Units  total) by mouth every 7 (seven) days.  Dispense: 4 capsule; Refill: 0  4. Pre-diabetes Denise Cline will continue to work on weight loss, exercise, and decreasing simple carbohydrates to help decrease the risk of diabetes. Repeat labs in 3 months.  5. Obesity with current BMI of 31.7  Denise Cline is currently in the action stage of change. As such, her goal is to continue with weight loss efforts. She has agreed to the Category 2 Plan.   Plan to work on getting some food in throughout the day.  Exercise goals: No exercise has been prescribed at this time.  Behavioral modification strategies: increasing lean protein intake, no skipping meals, meal planning and cooking strategies, and keeping healthy foods in the home.  Denise Cline has agreed to follow-up with our clinic in 2 weeks. She was informed of the importance of frequent follow-up visits to maximize her success with intensive lifestyle modifications for her multiple health conditions.   Objective:   Blood pressure 109/73, pulse 83, temperature 98 F (36.7 C), height '5\' 6"'$  (1.676 m), weight 196 lb (88.9 kg), SpO2 98 %. Body mass index is 31.64 kg/m.  General: Cooperative, alert, well developed, in no acute distress. HEENT: Conjunctivae and lids unremarkable. Cardiovascular: Regular rhythm.  Lungs: Normal work of breathing. Neurologic: No focal deficits.   Lab Results  Component Value Date   CREATININE 1.12 (H) 11/02/2020   BUN 20 11/02/2020   NA 138 11/02/2020   K 4.1 11/02/2020   CL 99 11/02/2020   CO2 24 11/02/2020   Lab Results  Component  Value Date   ALT 19 11/02/2020   AST 17 11/02/2020   ALKPHOS 48 11/02/2020   BILITOT 0.5 11/02/2020   Lab Results  Component Value Date   HGBA1C 6.1 (H) 11/02/2020   HGBA1C 6.0 (H) 03/09/2019   Lab Results  Component Value Date   INSULIN 18.8 11/02/2020   Lab Results  Component Value Date   TSH 2.650 11/02/2020   Lab Results  Component Value Date   CHOL 142 11/02/2020    HDL 55 11/02/2020   LDLCALC 63 11/02/2020   TRIG 137 11/02/2020   CHOLHDL 3.4 12/25/2017   Lab Results  Component Value Date   VD25OH 27.6 (L) 11/02/2020   Lab Results  Component Value Date   WBC 5.1 11/02/2020   HGB 12.3 11/02/2020   HCT 36.8 11/02/2020   MCV 94 11/02/2020   PLT 219 11/02/2020   No results found for: IRON, TIBC, FERRITIN  Obesity Behavioral Intervention:   Approximately 15 minutes were spent on the discussion below.  ASK: We discussed the diagnosis of obesity with Denise Cline today and Denise Cline agreed to give [place] permission to discuss obesity behavioral modification therapy today.  ASSESS: Denise Cline has the diagnosis of obesity and her BMI today is 31.7. Denise Cline is in the action stage of change.   ADVISE: Denise Cline was educated on the multiple health risks of obesity as well as the benefit of weight loss to improve her health. She was advised of the need for long term treatment and the importance of lifestyle modifications to improve her current health and to decrease her risk of future health problems.  AGREE: Multiple dietary modification options and treatment options were discussed and Denise Cline agreed to follow the recommendations documented in the above note.  ARRANGE: Denise Cline was educated on the importance of frequent visits to treat obesity as outlined per CMS and USPSTF guidelines and agreed to schedule her next follow up appointment today.  Attestation Statements:   Reviewed by clinician on day of visit: allergies, medications, problem list, medical history, surgical history, family history, social history, and previous encounter notes.  Coral Ceo, CMA, am acting as transcriptionist for Coralie Common, MD.   I have reviewed the above documentation for accuracy and completeness, and I agree with the above. - Coralie Common, MD

## 2020-11-18 DIAGNOSIS — F322 Major depressive disorder, single episode, severe without psychotic features: Secondary | ICD-10-CM | POA: Diagnosis not present

## 2020-11-18 DIAGNOSIS — R32 Unspecified urinary incontinence: Secondary | ICD-10-CM | POA: Diagnosis not present

## 2020-11-18 DIAGNOSIS — E782 Mixed hyperlipidemia: Secondary | ICD-10-CM | POA: Diagnosis not present

## 2020-11-18 DIAGNOSIS — I10 Essential (primary) hypertension: Secondary | ICD-10-CM | POA: Diagnosis not present

## 2020-11-18 DIAGNOSIS — G629 Polyneuropathy, unspecified: Secondary | ICD-10-CM | POA: Diagnosis not present

## 2020-11-18 DIAGNOSIS — Z23 Encounter for immunization: Secondary | ICD-10-CM | POA: Diagnosis not present

## 2020-11-18 DIAGNOSIS — I739 Peripheral vascular disease, unspecified: Secondary | ICD-10-CM | POA: Diagnosis not present

## 2020-11-18 DIAGNOSIS — I712 Thoracic aortic aneurysm, without rupture: Secondary | ICD-10-CM | POA: Diagnosis not present

## 2020-11-18 DIAGNOSIS — Z86718 Personal history of other venous thrombosis and embolism: Secondary | ICD-10-CM | POA: Diagnosis not present

## 2020-11-18 DIAGNOSIS — E559 Vitamin D deficiency, unspecified: Secondary | ICD-10-CM | POA: Diagnosis not present

## 2020-11-18 DIAGNOSIS — R69 Illness, unspecified: Secondary | ICD-10-CM | POA: Diagnosis not present

## 2020-11-18 DIAGNOSIS — E039 Hypothyroidism, unspecified: Secondary | ICD-10-CM | POA: Diagnosis not present

## 2020-11-18 DIAGNOSIS — K219 Gastro-esophageal reflux disease without esophagitis: Secondary | ICD-10-CM | POA: Diagnosis not present

## 2020-11-18 DIAGNOSIS — G4733 Obstructive sleep apnea (adult) (pediatric): Secondary | ICD-10-CM | POA: Diagnosis not present

## 2020-11-28 DIAGNOSIS — J4521 Mild intermittent asthma with (acute) exacerbation: Secondary | ICD-10-CM | POA: Diagnosis not present

## 2020-11-28 DIAGNOSIS — I10 Essential (primary) hypertension: Secondary | ICD-10-CM | POA: Diagnosis not present

## 2020-11-28 DIAGNOSIS — E039 Hypothyroidism, unspecified: Secondary | ICD-10-CM | POA: Diagnosis not present

## 2020-11-28 DIAGNOSIS — J449 Chronic obstructive pulmonary disease, unspecified: Secondary | ICD-10-CM | POA: Diagnosis not present

## 2020-11-28 DIAGNOSIS — E7800 Pure hypercholesterolemia, unspecified: Secondary | ICD-10-CM | POA: Diagnosis not present

## 2020-11-28 DIAGNOSIS — K219 Gastro-esophageal reflux disease without esophagitis: Secondary | ICD-10-CM | POA: Diagnosis not present

## 2020-11-28 DIAGNOSIS — E782 Mixed hyperlipidemia: Secondary | ICD-10-CM | POA: Diagnosis not present

## 2020-11-28 DIAGNOSIS — G43109 Migraine with aura, not intractable, without status migrainosus: Secondary | ICD-10-CM | POA: Diagnosis not present

## 2020-11-28 DIAGNOSIS — I2510 Atherosclerotic heart disease of native coronary artery without angina pectoris: Secondary | ICD-10-CM | POA: Diagnosis not present

## 2020-11-28 DIAGNOSIS — R69 Illness, unspecified: Secondary | ICD-10-CM | POA: Diagnosis not present

## 2020-11-30 ENCOUNTER — Ambulatory Visit (INDEPENDENT_AMBULATORY_CARE_PROVIDER_SITE_OTHER): Payer: Medicare HMO | Admitting: Family Medicine

## 2020-11-30 ENCOUNTER — Encounter (INDEPENDENT_AMBULATORY_CARE_PROVIDER_SITE_OTHER): Payer: Self-pay | Admitting: Family Medicine

## 2020-11-30 ENCOUNTER — Other Ambulatory Visit: Payer: Self-pay

## 2020-11-30 VITALS — BP 111/75 | HR 60 | Temp 97.9°F | Ht 66.0 in | Wt 190.0 lb

## 2020-11-30 DIAGNOSIS — Z6831 Body mass index (BMI) 31.0-31.9, adult: Secondary | ICD-10-CM

## 2020-11-30 DIAGNOSIS — E669 Obesity, unspecified: Secondary | ICD-10-CM | POA: Diagnosis not present

## 2020-11-30 DIAGNOSIS — I10 Essential (primary) hypertension: Secondary | ICD-10-CM

## 2020-11-30 DIAGNOSIS — E559 Vitamin D deficiency, unspecified: Secondary | ICD-10-CM | POA: Diagnosis not present

## 2020-11-30 MED ORDER — VITAMIN D (ERGOCALCIFEROL) 1.25 MG (50000 UNIT) PO CAPS: 50000.0000 [IU] | ORAL_CAPSULE | ORAL | 0 refills | Status: DC

## 2020-12-01 NOTE — Progress Notes (Signed)
Chief Complaint:   OBESITY Denise Cline is here to discuss her progress with her obesity treatment plan along with follow-up of her obesity related diagnoses. Denise Cline is on the Category 2 Plan and states she is following her eating plan approximately 100% of the time. Denise Cline states she is walking the dog 10 minutes 5 times per week.  Today's visit was #: 3 Starting weight: 196 lbs Starting date: 11/02/2020 Today's weight: 190 lbs Today's date: 11/30/2020 Total lbs lost to date: 6 Total lbs lost since last in-office visit: 6  Interim History: Denise Cline mentions she had fun over the last few weeks. She has started antibiotic for urinary tract infection. She got bread and meat from plan and microwave meals. She is doing Special K and milk for breakfast, fruit for lunch, and microwave meal for dinner.  Subjective:   1. Vitamin D deficiency Denise Cline denies nausea, vomiting, and muscle weakness but notes fatigue. She is on prescription Vit D.  2. Essential hypertension BP controlled today. Pt denies chest pain/chest pressure/headache. She is on losartan, HCTZ, and Toprol.  Assessment/Plan:   1. Vitamin D deficiency Low Vitamin D level contributes to fatigue and are associated with obesity, breast, and colon cancer. She agrees to continue to take prescription Vitamin D 50,000 IU every week and will follow-up for routine testing of Vitamin D, at least 2-3 times per year to avoid over-replacement.  Refill- Vitamin D, Ergocalciferol, (DRISDOL) 1.25 MG (50000 UNIT) CAPS capsule; Take 1 capsule (50,000 Units total) by mouth every 7 (seven) days.  Dispense: 4 capsule; Refill: 0  2. Essential hypertension Denise Cline is working on healthy weight loss and exercise to improve blood pressure control. We will watch for signs of hypotension as she continues her lifestyle modifications. We will consider decreasing medication at next appt.  3. Obesity with current BMI of 30.7  Denise Cline is currently in  the action stage of change. As such, her goal is to continue with weight loss efforts. She has agreed to the Category 2 Plan.   Pt was given handout for eating out.  Exercise goals: All adults should avoid inactivity. Some physical activity is better than none, and adults who participate in any amount of physical activity gain some health benefits.  Behavioral modification strategies: increasing lean protein intake, no skipping meals, meal planning and cooking strategies, keeping healthy foods in the home, and planning for success.  Denise Cline has agreed to follow-up with our clinic in 2-3 weeks. She was informed of the importance of frequent follow-up visits to maximize her success with intensive lifestyle modifications for her multiple health conditions.   Objective:   Blood pressure 111/75, pulse 60, temperature 97.9 F (36.6 C), height 5\' 6"  (1.676 m), weight 190 lb (86.2 kg), SpO2 100 %. Body mass index is 30.67 kg/m.  General: Cooperative, alert, well developed, in no acute distress. HEENT: Conjunctivae and lids unremarkable. Cardiovascular: Regular rhythm.  Lungs: Normal work of breathing. Neurologic: No focal deficits.   Lab Results  Component Value Date   CREATININE 1.12 (H) 11/02/2020   BUN 20 11/02/2020   NA 138 11/02/2020   K 4.1 11/02/2020   CL 99 11/02/2020   CO2 24 11/02/2020   Lab Results  Component Value Date   ALT 19 11/02/2020   AST 17 11/02/2020   ALKPHOS 48 11/02/2020   BILITOT 0.5 11/02/2020   Lab Results  Component Value Date   HGBA1C 6.1 (H) 11/02/2020   HGBA1C 6.0 (H) 03/09/2019   Lab Results  Component Value Date   INSULIN 18.8 11/02/2020   Lab Results  Component Value Date   TSH 2.650 11/02/2020   Lab Results  Component Value Date   CHOL 142 11/02/2020   HDL 55 11/02/2020   LDLCALC 63 11/02/2020   TRIG 137 11/02/2020   CHOLHDL 3.4 12/25/2017   Lab Results  Component Value Date   VD25OH 27.6 (L) 11/02/2020   Lab Results   Component Value Date   WBC 5.1 11/02/2020   HGB 12.3 11/02/2020   HCT 36.8 11/02/2020   MCV 94 11/02/2020   PLT 219 11/02/2020    Attestation Statements:   Reviewed by clinician on day of visit: allergies, medications, problem list, medical history, surgical history, family history, social history, and previous encounter notes.  Time spent on visit including pre-visit chart review and post-visit care and charting was 25 minutes.   Coral Ceo, CMA, am acting as transcriptionist for Coralie Common, MD.   I have reviewed the above documentation for accuracy and completeness, and I agree with the above. - Coralie Common, MD

## 2020-12-09 DIAGNOSIS — I2510 Atherosclerotic heart disease of native coronary artery without angina pectoris: Secondary | ICD-10-CM | POA: Diagnosis not present

## 2020-12-09 DIAGNOSIS — N390 Urinary tract infection, site not specified: Secondary | ICD-10-CM | POA: Diagnosis not present

## 2020-12-16 ENCOUNTER — Other Ambulatory Visit: Payer: Self-pay | Admitting: Surgery

## 2020-12-16 DIAGNOSIS — I712 Thoracic aortic aneurysm, without rupture, unspecified: Secondary | ICD-10-CM

## 2020-12-21 ENCOUNTER — Ambulatory Visit (INDEPENDENT_AMBULATORY_CARE_PROVIDER_SITE_OTHER): Payer: Medicare HMO | Admitting: Family Medicine

## 2020-12-22 ENCOUNTER — Other Ambulatory Visit (INDEPENDENT_AMBULATORY_CARE_PROVIDER_SITE_OTHER): Payer: Self-pay | Admitting: Family Medicine

## 2020-12-22 DIAGNOSIS — E559 Vitamin D deficiency, unspecified: Secondary | ICD-10-CM

## 2020-12-22 NOTE — Telephone Encounter (Signed)
LAST APPOINTMENT DATE: 11/30/20 NEXT APPOINTMENT DATE: 01/04/21   [facility] - [place], [place] - [address] [place] [address] Phone: [phone] Fax: [phone]  [facility] ([facility]), [place] - [place] [address] [place] ([place]) [place] [address] Phone: [phone] Fax: [phone]  Patient is requesting a refill of the following medications: Pending Prescriptions:                       Disp   Refills   Vitamin D, Ergocalciferol, (DRISDOL) 1.25 *4 caps*0       Sig: Take 1 capsule (50,000 Units total) by mouth every 7          (seven) days.   Date last filled: 11/30/20 Previously prescribed by Dr.Ukleja  Lab Results      Component                Value               Date                      HGBA1C                   6.1 (H)             11/02/2020                HGBA1C                   6.0 (H)             03/09/2019           Lab Results      Component                Value               Date                      LDLCALC                  63                  11/02/2020                CREATININE               1.12 (H)            11/02/2020           Lab Results      Component                Value               Date                      VD25OH                   27.6 (L)            11/02/2020            BP Readings from Last 3 Encounters: 11/30/20 : 111/75 11/16/20 : 109/73 11/02/20 : 117/70

## 2020-12-23 DIAGNOSIS — R69 Illness, unspecified: Secondary | ICD-10-CM | POA: Diagnosis not present

## 2020-12-23 DIAGNOSIS — J4521 Mild intermittent asthma with (acute) exacerbation: Secondary | ICD-10-CM | POA: Diagnosis not present

## 2020-12-23 DIAGNOSIS — I10 Essential (primary) hypertension: Secondary | ICD-10-CM | POA: Diagnosis not present

## 2020-12-23 DIAGNOSIS — N1831 Chronic kidney disease, stage 3a: Secondary | ICD-10-CM | POA: Diagnosis not present

## 2020-12-23 DIAGNOSIS — E039 Hypothyroidism, unspecified: Secondary | ICD-10-CM | POA: Diagnosis not present

## 2020-12-23 DIAGNOSIS — M1990 Unspecified osteoarthritis, unspecified site: Secondary | ICD-10-CM | POA: Diagnosis not present

## 2020-12-23 DIAGNOSIS — G43109 Migraine with aura, not intractable, without status migrainosus: Secondary | ICD-10-CM | POA: Diagnosis not present

## 2020-12-23 DIAGNOSIS — K219 Gastro-esophageal reflux disease without esophagitis: Secondary | ICD-10-CM | POA: Diagnosis not present

## 2020-12-23 DIAGNOSIS — E782 Mixed hyperlipidemia: Secondary | ICD-10-CM | POA: Diagnosis not present

## 2020-12-23 DIAGNOSIS — J449 Chronic obstructive pulmonary disease, unspecified: Secondary | ICD-10-CM | POA: Diagnosis not present

## 2020-12-26 ENCOUNTER — Ambulatory Visit (INDEPENDENT_AMBULATORY_CARE_PROVIDER_SITE_OTHER): Payer: Medicare HMO | Admitting: Psychologist

## 2020-12-26 DIAGNOSIS — R69 Illness, unspecified: Secondary | ICD-10-CM | POA: Diagnosis not present

## 2020-12-26 DIAGNOSIS — F331 Major depressive disorder, recurrent, moderate: Secondary | ICD-10-CM | POA: Diagnosis not present

## 2021-01-04 ENCOUNTER — Other Ambulatory Visit: Payer: Self-pay

## 2021-01-04 ENCOUNTER — Encounter (INDEPENDENT_AMBULATORY_CARE_PROVIDER_SITE_OTHER): Payer: Self-pay | Admitting: Family Medicine

## 2021-01-04 ENCOUNTER — Ambulatory Visit (INDEPENDENT_AMBULATORY_CARE_PROVIDER_SITE_OTHER): Payer: Medicare HMO | Admitting: Family Medicine

## 2021-01-04 VITALS — BP 101/63 | HR 58 | Temp 97.7°F | Ht 66.0 in | Wt 191.0 lb

## 2021-01-04 DIAGNOSIS — Z6831 Body mass index (BMI) 31.0-31.9, adult: Secondary | ICD-10-CM | POA: Diagnosis not present

## 2021-01-04 DIAGNOSIS — E669 Obesity, unspecified: Secondary | ICD-10-CM

## 2021-01-04 DIAGNOSIS — E559 Vitamin D deficiency, unspecified: Secondary | ICD-10-CM | POA: Diagnosis not present

## 2021-01-04 DIAGNOSIS — I10 Essential (primary) hypertension: Secondary | ICD-10-CM | POA: Diagnosis not present

## 2021-01-04 MED ORDER — VITAMIN D (ERGOCALCIFEROL) 1.25 MG (50000 UNIT) PO CAPS: 50000.0000 [IU] | ORAL_CAPSULE | ORAL | 0 refills | Status: DC

## 2021-01-05 NOTE — Progress Notes (Signed)
Chief Complaint:   OBESITY Denise Cline is here to discuss her progress with her obesity treatment plan along with follow-up of her obesity related diagnoses. Denise Cline is on the Category 2 Plan and states she is following her eating plan approximately 95% of the time. Denise Cline states she is walking 5,000 steps 7 times per week.  Today's visit was #: 4 Starting weight: 196 lbs Starting date: 11/02/2020 Today's weight: 191 lbs Today's date: 01/04/2021 Total lbs lost to date: 5 Total lbs lost since last in-office visit: 0  Interim History: Denise Cline is dealing with some familial stress- sister's cancer came back and her brother is dealing with some health issues as well. Pt has been eating yogurt and eating microwave meals. She has done herb roasted vegetables with [place]. Breakfast is [place] sausage; Lunch is microwave meal and [place] yogurt; Supper is microwave meal. She has a peach, pear, or apple with lunch. Pt is wondering about incorporating a smoothie into the meal plan.  Subjective:   1. Vitamin D deficiency Pt denies nausea, vomiting, and muscle weakness but notes fatigue. She is on prescription Vit D.  2. Essential hypertension BP controlled today. Denise Cline is on Cozaar, HCTZ, and Toprol.  Assessment/Plan:   1. Vitamin D deficiency Low Vitamin D level contributes to fatigue and are associated with obesity, breast, and colon cancer. She agrees to continue to take prescription Vitamin D 50,000 IU every week and will follow-up for routine testing of Vitamin D, at least 2-3 times per year to avoid over-replacement.  Refill- Vitamin D, Ergocalciferol, (DRISDOL) 1.25 MG (50000 UNIT) CAPS capsule; Take 1 capsule (50,000 Units total) by mouth every 7 (seven) days.  Dispense: 4 capsule; Refill: 0  2. Essential hypertension Denise Cline is working on healthy weight loss and exercise to improve blood pressure control. We will watch for signs of hypotension as she continues her lifestyle  modifications. Decrease losartan by 1/2.  3. Class 1 obesity with serious comorbidity and body mass index (BMI) of 31.0 to 31.9 in adult, unspecified obesity type  Denise Cline is currently in the action stage of change. As such, her goal is to continue with weight loss efforts. She has agreed to the Category 2 Plan- add protein shake smoothie.   Exercise goals: All adults should avoid inactivity. Some physical activity is better than none, and adults who participate in any amount of physical activity gain some health benefits.  Behavioral modification strategies: increasing lean protein intake, meal planning and cooking strategies, keeping healthy foods in the home, and planning for success.  Denise Cline has agreed to follow-up with our clinic in 2-3 weeks. She was informed of the importance of frequent follow-up visits to maximize her success with intensive lifestyle modifications for her multiple health conditions.   Objective:   Blood pressure 101/63, pulse (!) 58, temperature 97.7 F (36.5 C), height 5\' 6"  (1.676 m), weight 191 lb (86.6 kg), SpO2 99 %. Body mass index is 30.83 kg/m.  General: Cooperative, alert, well developed, in no acute distress. HEENT: Conjunctivae and lids unremarkable. Cardiovascular: Regular rhythm.  Lungs: Normal work of breathing. Neurologic: No focal deficits.   Lab Results  Component Value Date   CREATININE 1.12 (H) 11/02/2020   BUN 20 11/02/2020   NA 138 11/02/2020   K 4.1 11/02/2020   CL 99 11/02/2020   CO2 24 11/02/2020   Lab Results  Component Value Date   ALT 19 11/02/2020   AST 17 11/02/2020   ALKPHOS 48 11/02/2020   BILITOT  0.5 11/02/2020   Lab Results  Component Value Date   HGBA1C 6.1 (H) 11/02/2020   HGBA1C 6.0 (H) 03/09/2019   Lab Results  Component Value Date   INSULIN 18.8 11/02/2020   Lab Results  Component Value Date   TSH 2.650 11/02/2020   Lab Results  Component Value Date   CHOL 142 11/02/2020   HDL 55 11/02/2020    LDLCALC 63 11/02/2020   TRIG 137 11/02/2020   CHOLHDL 3.4 12/25/2017   Lab Results  Component Value Date   VD25OH 27.6 (L) 11/02/2020   Lab Results  Component Value Date   WBC 5.1 11/02/2020   HGB 12.3 11/02/2020   HCT 36.8 11/02/2020   MCV 94 11/02/2020   PLT 219 11/02/2020    Attestation Statements:   Reviewed by clinician on day of visit: allergies, medications, problem list, medical history, surgical history, family history, social history, and previous encounter notes.  Coral Ceo, CMA, am acting as transcriptionist for Coralie Common, MD.   I have reviewed the above documentation for accuracy and completeness, and I agree with the above. - Coralie Common, MD

## 2021-01-10 ENCOUNTER — Other Ambulatory Visit: Payer: Self-pay

## 2021-01-10 ENCOUNTER — Ambulatory Visit (INDEPENDENT_AMBULATORY_CARE_PROVIDER_SITE_OTHER): Payer: Medicare HMO | Admitting: Psychologist

## 2021-01-10 DIAGNOSIS — F331 Major depressive disorder, recurrent, moderate: Secondary | ICD-10-CM

## 2021-01-10 DIAGNOSIS — R69 Illness, unspecified: Secondary | ICD-10-CM | POA: Diagnosis not present

## 2021-01-18 ENCOUNTER — Ambulatory Visit (INDEPENDENT_AMBULATORY_CARE_PROVIDER_SITE_OTHER): Payer: Medicare HMO | Admitting: Family Medicine

## 2021-01-21 ENCOUNTER — Other Ambulatory Visit: Payer: Self-pay | Admitting: Interventional Cardiology

## 2021-01-23 DIAGNOSIS — R69 Illness, unspecified: Secondary | ICD-10-CM | POA: Diagnosis not present

## 2021-01-23 DIAGNOSIS — I2510 Atherosclerotic heart disease of native coronary artery without angina pectoris: Secondary | ICD-10-CM | POA: Diagnosis not present

## 2021-01-23 DIAGNOSIS — K219 Gastro-esophageal reflux disease without esophagitis: Secondary | ICD-10-CM | POA: Diagnosis not present

## 2021-01-23 DIAGNOSIS — J4521 Mild intermittent asthma with (acute) exacerbation: Secondary | ICD-10-CM | POA: Diagnosis not present

## 2021-01-23 DIAGNOSIS — E782 Mixed hyperlipidemia: Secondary | ICD-10-CM | POA: Diagnosis not present

## 2021-01-23 DIAGNOSIS — N1831 Chronic kidney disease, stage 3a: Secondary | ICD-10-CM | POA: Diagnosis not present

## 2021-01-23 DIAGNOSIS — G43109 Migraine with aura, not intractable, without status migrainosus: Secondary | ICD-10-CM | POA: Diagnosis not present

## 2021-01-23 DIAGNOSIS — M1990 Unspecified osteoarthritis, unspecified site: Secondary | ICD-10-CM | POA: Diagnosis not present

## 2021-01-23 DIAGNOSIS — J449 Chronic obstructive pulmonary disease, unspecified: Secondary | ICD-10-CM | POA: Diagnosis not present

## 2021-01-23 DIAGNOSIS — I10 Essential (primary) hypertension: Secondary | ICD-10-CM | POA: Diagnosis not present

## 2021-01-24 DIAGNOSIS — G4733 Obstructive sleep apnea (adult) (pediatric): Secondary | ICD-10-CM | POA: Diagnosis not present

## 2021-01-25 ENCOUNTER — Ambulatory Visit: Payer: Medicare HMO | Admitting: Surgery

## 2021-01-25 ENCOUNTER — Other Ambulatory Visit: Payer: Self-pay

## 2021-01-25 ENCOUNTER — Ambulatory Visit
Admission: RE | Admit: 2021-01-25 | Discharge: 2021-01-25 | Disposition: A | Discharge status: Home / Self Care | Payer: Medicare HMO | Source: Ambulatory Visit | Attending: Surgery | Admitting: Surgery

## 2021-01-25 ENCOUNTER — Encounter: Payer: Self-pay | Admitting: Surgery

## 2021-01-25 VITALS — BP 123/70 | HR 62 | Resp 20 | Ht 66.0 in | Wt 192.0 lb

## 2021-01-25 DIAGNOSIS — I712 Thoracic aortic aneurysm, without rupture, unspecified: Secondary | ICD-10-CM

## 2021-01-25 MED ORDER — IOPAMIDOL (ISOVUE-370) INJECTION 76%
75.0000 mL | Freq: Once | INTRAVENOUS | Status: AC | PRN
  Administered 2021-01-25: 75 mL via INTRAVENOUS

## 2021-01-25 NOTE — Progress Notes (Signed)
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  Since I saw her last year she has continued to feel fairly well.  She does report being tired but denies any shortness of breath.  She has had no chest or back pain.  She had a thyroid ultrasound and biopsy of a right thyroid nodule on 02/09/2020.  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  Current Outpatient Medications  Medication Sig Dispense Refill   acetaminophen (TYLENOL) 650 MG CR tablet Take 1,300 mg by mouth 2 (two) times a day.     aspirin EC 81 MG tablet Take 81 mg by mouth daily. Swallow whole.     Biotin 5 MG TABS Take by mouth.     buPROPion (WELLBUTRIN SR) 150 MG 12 hr tablet Take 150 mg by mouth 2 (two) times daily.      calcium-vitamin D (OSCAL WITH D) 250-125 MG-UNIT tablet Take 2 tablets by mouth daily.     Colesevelam HCl 3.75 g PACK Take by mouth.     DOXYCYCLINE HYCLATE PO Take 100 mg by mouth.     hydrochlorothiazide (HYDRODIURIL) 25 MG tablet Take 25 mg by mouth daily.      losartan (COZAAR) 100 MG tablet Take 100 mg by mouth daily.     Magnesium 250 MG TABS Take by mouth.     metoprolol succinate (TOPROL-XL) 25 MG 24 hr tablet Take 1/2 (one-half) tablet by mouth once daily 45 tablet 3   Multiple Vitamins-Minerals (CENTRUM SILVER ADULT 50+) TABS Take 1 tablet by mouth daily.     pantoprazole (PROTONIX) 40 MG tablet TAKE ONE TABLET BY MOUTH EVERY EVENING 90 tablet 3   polyethylene glycol (MIRALAX / GLYCOLAX) packet Take 17 g by mouth daily. 14 each 0   rivaroxaban (XARELTO) 20 MG TABS tablet Take 20 mg by mouth daily with supper.     rosuvastatin (CRESTOR) 20 MG tablet TAKE ONE TABLET BY MOUTH EVERY EVENING 90 tablet 3   saccharomyces boulardii (FLORASTOR) 250 MG capsule Take 250 mg by mouth 2 (two) times daily.     sertraline (ZOLOFT) 50 MG tablet TAKE 1/2 TABLET BY MOUTH ONCE A DAY WITH MEALS FOR 7 DAYS, THEN 1 TAB DAILY     traMADol  (ULTRAM) 50 MG tablet Take 1 tablet (50 mg total) by mouth every 6 (six) hours as needed for moderate pain. 30 tablet 0   traZODone (DESYREL) 50 MG tablet Take 50 mg by mouth at bedtime.     triamcinolone (KENALOG) 0.025 % cream Apply 1 application topically daily as needed for itching.     Vitamin D, Ergocalciferol, (DRISDOL) 1.25 MG (50000 UNIT) CAPS capsule Take 1 capsule (50,000 Units total) by mouth every 7 (seven) days. 4 capsule 0   No current facility-administered medications for this visit.     Physical Exam: BP 123/70   Pulse 62   Resp 20   Ht 5\' 6"  (1.676 m)   Wt 192 lb (87.1 kg)   SpO2 95% Comment: RA  BMI 30.99 kg/m  She looks well. Cardiac exam shows regular rate and rhythm with normal heart sounds.  There is no murmur. Lungs are clear.  Diagnostic Tests:  Narrative & Impression  CLINICAL DATA:  Mir thoracic aortic aneurysm. Intermittent palpitations.   EXAM: CT ANGIOGRAPHY CHEST WITH CONTRAST   TECHNIQUE: Multidetector CT imaging of the chest was performed using the standard protocol during bolus administration  of intravenous contrast. Multiplanar CT image reconstructions and MIPs were obtained to evaluate the vascular anatomy.   CONTRAST:  36mL ISOVUE-370 IOPAMIDOL (ISOVUE-370) INJECTION 76%   Creatinine was obtained on site at [facility] at [address]   Results: Creatinine 1.1 mg/dL.   COMPARISON:  12/30/2019.   FINDINGS: Cardiovascular: Ascending aorta measures up to 4.1 cm (coronal series 10, image 95), stable from 12/30/2019. Atherosclerotic calcification of the aorta. Pulmonic trunk and heart are enlarged. No pericardial effusion.   Mediastinum/Nodes: Heterogeneous right thyroid nodule measures 3.1 cm. No pathologically enlarged mediastinal or hilar lymph nodes. Right axillary lymph nodes measure up to 1.5 cm, unchanged from 12/30/2019. No left axillary adenopathy. Esophagus is unremarkable.   Lungs/Pleura: Centrilobular  emphysema. 3 mm superior segment left lower lobe nodule (7/68), unchanged and benign. Calcified granulomas. No pleural fluid. Airway is unremarkable.   Upper Abdomen: Visualized portions of the liver and adrenal glands are unremarkable. Scarring in the kidneys bilaterally. Subcentimeter low-attenuation lesions in the kidneys are too small to characterize. Hyperdense 2.3 cm lesion in the superior aspect of the spleen, unchanged and possibly a hemangioma. Visualized portions of the pancreas, stomach and bowel are grossly unremarkable. Upper abdominal lymph nodes are not enlarged by CT size criteria.   Musculoskeletal: Degenerative changes in the spine. No worrisome lytic or sclerotic lesions.   Review of the MIP images confirms the above findings.   IMPRESSION: 1. Ascending aortic aneurysm, stable. Recommend annual imaging followup by CTA or MRA. This recommendation follows 2010 ACCF/AHA/AATS/ACR/ASA/SCA/SCAI/SIR/STS/SVM Guidelines for the Diagnosis and Management of Patients with Thoracic Aortic Disease. Circulation. 2010; 121: V694-H038. Aortic aneurysm NOS (ICD10-I71.9). 2. Similar borderline enlarged right axillary lymph nodes. 3. Heterogeneous right thyroid nodule. No follow-up recommended unless clinically warranted as ultrasound and biopsy were performed 01/08/2020 and 02/09/2020, respectively. (Ref: J Am Coll Radiol. 2015 Feb;12(2): 143-50). 4.  Aortic atherosclerosis (ICD10-I70.0). 5. Enlarged pulmonic trunk, indicative of pulmonary arterial hypertension.     Electronically Signed   By: Lorin Picket M.D.   On: 01/25/2021 11:55      Impression:  She has a stable 4.1 cm fusiform ascending aortic aneurysm.  This is well below the surgical threshold of 5.5 cm.  I reviewed the CTA images with her and answered her questions.  I stressed the importance of continued good blood pressure control in preventing further enlargement and acute aortic dissection.  Plan:  She  will return to see me in 1 year with a CT of the chest without contrast for follow-up of her ascending aortic aneurysm  I spent 20 minutes performing this established patient evaluation and > 50% of this time was spent face to face counseling and coordinating the care of this patient's aortic aneurysm.    Gaye Pollack, MD [facility] [phone]

## 2021-01-30 ENCOUNTER — Ambulatory Visit (INDEPENDENT_AMBULATORY_CARE_PROVIDER_SITE_OTHER): Payer: Medicare HMO | Admitting: Psychologist

## 2021-01-30 DIAGNOSIS — F331 Major depressive disorder, recurrent, moderate: Secondary | ICD-10-CM

## 2021-01-30 DIAGNOSIS — R69 Illness, unspecified: Secondary | ICD-10-CM | POA: Diagnosis not present

## 2021-02-06 ENCOUNTER — Other Ambulatory Visit: Payer: Self-pay

## 2021-02-06 ENCOUNTER — Ambulatory Visit (INDEPENDENT_AMBULATORY_CARE_PROVIDER_SITE_OTHER): Payer: Medicare HMO | Admitting: Family Medicine

## 2021-02-06 ENCOUNTER — Encounter (INDEPENDENT_AMBULATORY_CARE_PROVIDER_SITE_OTHER): Payer: Self-pay | Admitting: Family Medicine

## 2021-02-06 VITALS — BP 128/77 | HR 52 | Temp 97.7°F | Ht 66.0 in | Wt 189.0 lb

## 2021-02-06 DIAGNOSIS — R69 Illness, unspecified: Secondary | ICD-10-CM | POA: Diagnosis not present

## 2021-02-06 DIAGNOSIS — K760 Fatty (change of) liver, not elsewhere classified: Secondary | ICD-10-CM

## 2021-02-06 DIAGNOSIS — E669 Obesity, unspecified: Secondary | ICD-10-CM

## 2021-02-06 DIAGNOSIS — Z6831 Body mass index (BMI) 31.0-31.9, adult: Secondary | ICD-10-CM

## 2021-02-06 DIAGNOSIS — F4321 Adjustment disorder with depressed mood: Secondary | ICD-10-CM | POA: Diagnosis not present

## 2021-02-07 NOTE — Progress Notes (Signed)
Chief Complaint:   OBESITY Denise Cline is here to discuss her progress with her obesity treatment plan along with follow-up of her obesity related diagnoses. Denise Cline is on the Category 2 Plan and states she is following her eating plan approximately 0% of the time. Denise Cline states she is doing 0 minutes 0 times per week.  Today's visit was #: 5 Starting weight: 196 lbs Starting date: 11/02/2020 Today's weight: 189 lbs Today's date: 02/06/2021 Total lbs lost to date: 7 Total lbs lost since last in-office visit: 2  Interim History: Denise Cline did well avoiding weight gain, but this was in part due to staying with her sister while she was dying. She tried to not skip meals and portion control, and she is working on getting back on track.  Subjective:   1. Grieving Denise Cline's sister died recently and she spent the entire week with her in the hospital until her sister died. She is still processing her grief.  2. NAFLD (nonalcoholic fatty liver disease) Denise Cline has questions about fatty liver that was seen on her previous test. She denies abdominal pain or jaundice.  Assessment/Plan:   1. Grieving Denise Cline was offered support and encouragement today.  2. NAFLD (nonalcoholic fatty liver disease) Denise Cline was educated on fatty liver, including the importance of weight loss as the most important part of her treatment.  3. Obesity with current BMI 30.6 Denise Cline is currently in the action stage of change. As such, her goal is to continue with weight loss efforts. She has agreed to the Category 2 Plan.   Behavioral modification strategies: no skipping meals and emotional eating strategies.  Devin has agreed to follow-up with our clinic in 2 weeks. She was informed of the importance of frequent follow-up visits to maximize her success with intensive lifestyle modifications for her multiple health conditions.   Objective:   Blood pressure 128/77, pulse (!) 52, temperature 97.7 F  (36.5 C), height 5\' 6"  (1.676 m), weight 189 lb (85.7 kg), SpO2 97 %. Body mass index is 30.51 kg/m.  General: Cooperative, alert, well developed, in no acute distress. HEENT: Conjunctivae and lids unremarkable. Cardiovascular: Regular rhythm.  Lungs: Normal work of breathing. Neurologic: No focal deficits.   Lab Results  Component Value Date   CREATININE 1.12 (H) 11/02/2020   BUN 20 11/02/2020   NA 138 11/02/2020   K 4.1 11/02/2020   CL 99 11/02/2020   CO2 24 11/02/2020   Lab Results  Component Value Date   ALT 19 11/02/2020   AST 17 11/02/2020   ALKPHOS 48 11/02/2020   BILITOT 0.5 11/02/2020   Lab Results  Component Value Date   HGBA1C 6.1 (H) 11/02/2020   HGBA1C 6.0 (H) 03/09/2019   Lab Results  Component Value Date   INSULIN 18.8 11/02/2020   Lab Results  Component Value Date   TSH 2.650 11/02/2020   Lab Results  Component Value Date   CHOL 142 11/02/2020   HDL 55 11/02/2020   LDLCALC 63 11/02/2020   TRIG 137 11/02/2020   CHOLHDL 3.4 12/25/2017   Lab Results  Component Value Date   VD25OH 27.6 (L) 11/02/2020   Lab Results  Component Value Date   WBC 5.1 11/02/2020   HGB 12.3 11/02/2020   HCT 36.8 11/02/2020   MCV 94 11/02/2020   PLT 219 11/02/2020   No results found for: IRON, TIBC, FERRITIN  Attestation Statements:   Reviewed by clinician on day of visit: allergies, medications, problem list, medical history, surgical history,  family history, social history, and previous encounter notes.  Time spent on visit including pre-visit chart review and post-visit care and charting was 33 minutes.    I, Trixie Dredge, am acting as transcriptionist for Dennard Nip, MD.  I have reviewed the above documentation for accuracy and completeness, and I agree with the above. -  Dennard Nip, MD

## 2021-02-15 ENCOUNTER — Other Ambulatory Visit: Payer: Self-pay | Admitting: Interventional Cardiology

## 2021-02-22 ENCOUNTER — Other Ambulatory Visit (INDEPENDENT_AMBULATORY_CARE_PROVIDER_SITE_OTHER): Payer: Self-pay | Admitting: Family Medicine

## 2021-02-22 ENCOUNTER — Ambulatory Visit: Payer: PRIVATE HEALTH INSURANCE | Admitting: Psychologist

## 2021-02-22 DIAGNOSIS — E559 Vitamin D deficiency, unspecified: Secondary | ICD-10-CM

## 2021-02-23 ENCOUNTER — Ambulatory Visit (INDEPENDENT_AMBULATORY_CARE_PROVIDER_SITE_OTHER): Payer: Medicare HMO | Admitting: Family Medicine

## 2021-02-23 ENCOUNTER — Encounter (INDEPENDENT_AMBULATORY_CARE_PROVIDER_SITE_OTHER): Payer: Self-pay

## 2021-02-23 DIAGNOSIS — G4733 Obstructive sleep apnea (adult) (pediatric): Secondary | ICD-10-CM | POA: Diagnosis not present

## 2021-02-23 DIAGNOSIS — E039 Hypothyroidism, unspecified: Secondary | ICD-10-CM | POA: Diagnosis not present

## 2021-02-23 DIAGNOSIS — E782 Mixed hyperlipidemia: Secondary | ICD-10-CM | POA: Diagnosis not present

## 2021-02-23 DIAGNOSIS — N1831 Chronic kidney disease, stage 3a: Secondary | ICD-10-CM | POA: Diagnosis not present

## 2021-02-23 DIAGNOSIS — I10 Essential (primary) hypertension: Secondary | ICD-10-CM | POA: Diagnosis not present

## 2021-02-23 DIAGNOSIS — K219 Gastro-esophageal reflux disease without esophagitis: Secondary | ICD-10-CM | POA: Diagnosis not present

## 2021-02-23 DIAGNOSIS — R69 Illness, unspecified: Secondary | ICD-10-CM | POA: Diagnosis not present

## 2021-02-23 DIAGNOSIS — G43109 Migraine with aura, not intractable, without status migrainosus: Secondary | ICD-10-CM | POA: Diagnosis not present

## 2021-02-23 DIAGNOSIS — J449 Chronic obstructive pulmonary disease, unspecified: Secondary | ICD-10-CM | POA: Diagnosis not present

## 2021-02-23 DIAGNOSIS — J4521 Mild intermittent asthma with (acute) exacerbation: Secondary | ICD-10-CM | POA: Diagnosis not present

## 2021-02-23 DIAGNOSIS — I2510 Atherosclerotic heart disease of native coronary artery without angina pectoris: Secondary | ICD-10-CM | POA: Diagnosis not present

## 2021-03-26 DIAGNOSIS — G4733 Obstructive sleep apnea (adult) (pediatric): Secondary | ICD-10-CM | POA: Diagnosis not present

## 2021-03-27 DIAGNOSIS — J4521 Mild intermittent asthma with (acute) exacerbation: Secondary | ICD-10-CM | POA: Diagnosis not present

## 2021-03-27 DIAGNOSIS — J449 Chronic obstructive pulmonary disease, unspecified: Secondary | ICD-10-CM | POA: Diagnosis not present

## 2021-03-27 DIAGNOSIS — I10 Essential (primary) hypertension: Secondary | ICD-10-CM | POA: Diagnosis not present

## 2021-03-27 DIAGNOSIS — E782 Mixed hyperlipidemia: Secondary | ICD-10-CM | POA: Diagnosis not present

## 2021-03-27 DIAGNOSIS — N1831 Chronic kidney disease, stage 3a: Secondary | ICD-10-CM | POA: Diagnosis not present

## 2021-04-25 DIAGNOSIS — E782 Mixed hyperlipidemia: Secondary | ICD-10-CM | POA: Diagnosis not present

## 2021-04-25 DIAGNOSIS — I10 Essential (primary) hypertension: Secondary | ICD-10-CM | POA: Diagnosis not present

## 2021-05-10 DIAGNOSIS — E782 Mixed hyperlipidemia: Secondary | ICD-10-CM | POA: Diagnosis not present

## 2021-05-10 DIAGNOSIS — I2510 Atherosclerotic heart disease of native coronary artery without angina pectoris: Secondary | ICD-10-CM | POA: Diagnosis not present

## 2021-05-10 DIAGNOSIS — I10 Essential (primary) hypertension: Secondary | ICD-10-CM | POA: Diagnosis not present

## 2021-05-10 DIAGNOSIS — G629 Polyneuropathy, unspecified: Secondary | ICD-10-CM | POA: Diagnosis not present

## 2021-05-10 DIAGNOSIS — G4733 Obstructive sleep apnea (adult) (pediatric): Secondary | ICD-10-CM | POA: Diagnosis not present

## 2021-05-10 DIAGNOSIS — R69 Illness, unspecified: Secondary | ICD-10-CM | POA: Diagnosis not present

## 2021-05-10 DIAGNOSIS — I739 Peripheral vascular disease, unspecified: Secondary | ICD-10-CM | POA: Diagnosis not present

## 2021-05-10 DIAGNOSIS — Z0000 Encounter for general adult medical examination without abnormal findings: Secondary | ICD-10-CM | POA: Diagnosis not present

## 2021-05-10 DIAGNOSIS — E559 Vitamin D deficiency, unspecified: Secondary | ICD-10-CM | POA: Diagnosis not present

## 2021-05-10 DIAGNOSIS — K219 Gastro-esophageal reflux disease without esophagitis: Secondary | ICD-10-CM | POA: Diagnosis not present

## 2021-05-10 DIAGNOSIS — E039 Hypothyroidism, unspecified: Secondary | ICD-10-CM | POA: Diagnosis not present

## 2021-05-26 ENCOUNTER — Other Ambulatory Visit: Payer: Self-pay | Admitting: Surgery

## 2021-05-26 DIAGNOSIS — E041 Nontoxic single thyroid nodule: Secondary | ICD-10-CM

## 2021-06-06 DIAGNOSIS — E041 Nontoxic single thyroid nodule: Secondary | ICD-10-CM | POA: Diagnosis not present

## 2021-06-16 ENCOUNTER — Ambulatory Visit
Admission: RE | Admit: 2021-06-16 | Discharge: 2021-06-16 | Disposition: A | Discharge status: Home / Self Care | Payer: Medicare HMO | Source: Ambulatory Visit | Attending: Surgery | Admitting: Surgery

## 2021-06-16 DIAGNOSIS — E041 Nontoxic single thyroid nodule: Secondary | ICD-10-CM

## 2021-06-16 DIAGNOSIS — E890 Postprocedural hypothyroidism: Secondary | ICD-10-CM | POA: Diagnosis not present

## 2021-06-16 DIAGNOSIS — E042 Nontoxic multinodular goiter: Secondary | ICD-10-CM | POA: Diagnosis not present

## 2021-06-18 NOTE — Progress Notes (Signed)
USN is stable with no worrisome changes in the thyroid nodule.  Previous biopsy was benign. ? ?No further radiographic or surgical follow up is required at this time. ? ?Surgery will see patient back as needed. ? ?tmg ? ?Armandina Gemma, MD ?[facility] ?A [facility] practice ?Office: [phone] ?

## 2021-06-20 DIAGNOSIS — E041 Nontoxic single thyroid nodule: Secondary | ICD-10-CM | POA: Diagnosis not present

## 2021-07-04 DIAGNOSIS — G4733 Obstructive sleep apnea (adult) (pediatric): Secondary | ICD-10-CM | POA: Diagnosis not present

## 2021-07-21 DIAGNOSIS — E782 Mixed hyperlipidemia: Secondary | ICD-10-CM | POA: Diagnosis not present

## 2021-07-21 DIAGNOSIS — I2510 Atherosclerotic heart disease of native coronary artery without angina pectoris: Secondary | ICD-10-CM | POA: Diagnosis not present

## 2021-07-21 DIAGNOSIS — E039 Hypothyroidism, unspecified: Secondary | ICD-10-CM | POA: Diagnosis not present

## 2021-07-21 DIAGNOSIS — R69 Illness, unspecified: Secondary | ICD-10-CM | POA: Diagnosis not present

## 2021-07-21 DIAGNOSIS — F322 Major depressive disorder, single episode, severe without psychotic features: Secondary | ICD-10-CM | POA: Diagnosis not present

## 2021-07-21 DIAGNOSIS — K219 Gastro-esophageal reflux disease without esophagitis: Secondary | ICD-10-CM | POA: Diagnosis not present

## 2021-07-21 DIAGNOSIS — I10 Essential (primary) hypertension: Secondary | ICD-10-CM | POA: Diagnosis not present

## 2021-08-24 ENCOUNTER — Encounter (HOSPITAL_COMMUNITY): Payer: Self-pay | Admitting: Emergency Medicine

## 2021-08-24 ENCOUNTER — Emergency Department (HOSPITAL_COMMUNITY): Payer: Medicare HMO

## 2021-08-24 ENCOUNTER — Emergency Department (HOSPITAL_COMMUNITY)
Admission: EM | Admit: 2021-08-24 | Discharge: 2021-08-25 | Disposition: A | Discharge status: Home / Self Care | Payer: Medicare HMO | Attending: Emergency Medicine | Admitting: Emergency Medicine

## 2021-08-24 DIAGNOSIS — M436 Torticollis: Secondary | ICD-10-CM | POA: Insufficient documentation

## 2021-08-24 DIAGNOSIS — M542 Cervicalgia: Secondary | ICD-10-CM

## 2021-08-24 DIAGNOSIS — Z7982 Long term (current) use of aspirin: Secondary | ICD-10-CM | POA: Diagnosis not present

## 2021-08-24 DIAGNOSIS — Z7901 Long term (current) use of anticoagulants: Secondary | ICD-10-CM | POA: Insufficient documentation

## 2021-08-24 DIAGNOSIS — R519 Headache, unspecified: Secondary | ICD-10-CM | POA: Insufficient documentation

## 2021-08-24 LAB — BASIC METABOLIC PANEL
Anion gap: 9 (ref 5–15)
BUN: 23 mg/dL (ref 8–23)
CO2: 27 mmol/L (ref 22–32)
Calcium: 9 mg/dL (ref 8.9–10.3)
Chloride: 101 mmol/L (ref 98–111)
Creatinine, Ser: 1.04 mg/dL — ABNORMAL HIGH (ref 0.44–1.00)
GFR, Estimated: 53 mL/min — ABNORMAL LOW (ref >60)
Glucose, Bld: 103 mg/dL — ABNORMAL HIGH (ref 70–99)
Potassium: 4 mmol/L (ref 3.5–5.1)
Sodium: 137 mmol/L (ref 135–145)

## 2021-08-24 LAB — CBC WITH DIFFERENTIAL/PLATELET
Abs Immature Granulocytes: 0.01 10*3/uL (ref 0.00–0.07)
Basophils Absolute: 0 10*3/uL (ref 0.0–0.1)
Basophils Relative: 1 %
Eosinophils Absolute: 0.1 10*3/uL (ref 0.0–0.5)
Eosinophils Relative: 2 %
HCT: 39.3 % (ref 36.0–46.0)
Hemoglobin: 12.5 g/dL (ref 12.0–15.0)
Immature Granulocytes: 0 %
Lymphocytes Relative: 31 %
Lymphs Abs: 2 10*3/uL (ref 0.7–4.0)
MCH: 31.3 pg (ref 26.0–34.0)
MCHC: 31.8 g/dL (ref 30.0–36.0)
MCV: 98.5 fL (ref 80.0–100.0)
Monocytes Absolute: 0.4 10*3/uL (ref 0.1–1.0)
Monocytes Relative: 7 %
Neutro Abs: 3.7 10*3/uL (ref 1.7–7.7)
Neutrophils Relative %: 59 %
Platelets: 217 10*3/uL (ref 150–400)
RBC: 3.99 MIL/uL (ref 3.87–5.11)
RDW: 14.2 % (ref 11.5–15.5)
WBC: 6.3 10*3/uL (ref 4.0–10.5)
nRBC: 0 % (ref 0.0–0.2)

## 2021-08-24 MED ORDER — ACETAMINOPHEN 500 MG PO TABS
1000.0000 mg | ORAL_TABLET | ORAL | Status: AC
  Administered 2021-08-24: 1000 mg via ORAL
  Filled 2021-08-24: qty 2

## 2021-08-24 MED ORDER — LORAZEPAM 1 MG PO TABS
0.5000 mg | ORAL_TABLET | Freq: Once | ORAL | Status: AC
  Administered 2021-08-24: 0.5 mg via ORAL
  Filled 2021-08-24: qty 1

## 2021-08-24 MED ORDER — LIDOCAINE 5 % EX PTCH
1.0000 | MEDICATED_PATCH | CUTANEOUS | Status: DC
  Administered 2021-08-24: 1 via TRANSDERMAL
  Filled 2021-08-24: qty 1

## 2021-08-24 NOTE — ED Provider Triage Note (Signed)
Emergency Medicine Provider Triage Evaluation Note  Denise Cline , a 81 y.o. female  was evaluated in triage.  Pt complains of neck pain, starting several days ago.  No injuries, pain is worse with movement.  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  She had some pain into her right shoulder tonight.  No weakness or vision change.  Review of Systems  Positive: Neck pain Negative: Fever, SOB  Physical Exam  BP 117/84   Pulse 78   Temp 97.7 F (36.5 C) (Oral)   Resp 18   SpO2 100%  Gen:   Awake, no distress   Resp:  Normal effort  MSK:   Moves extremities without difficulty  Other:  Patient winces when she flexes or rotates her neck.  Mild tenderness to palpation posteriorly, more so on the right than left.  Medical Decision Making  Medically screening exam initiated at 5:37 PM.  Appropriate orders placed.  Clair Gulling Phang was informed that the remainder of the evaluation will be completed by another provider, this initial triage assessment does not replace that evaluation, and the importance of remaining in the ED until their evaluation is complete.     Carlisle Cater, PA-C 08/24/21 1738

## 2021-08-24 NOTE — ED Triage Notes (Signed)
Patient here with complaint of a posterior head and neck pain that started on the left three days ago and moved to the right posterior of her head. Patient denies any other complaints, is alert, oriented, ambulatory, and in no apparent distress at this time.

## 2021-08-24 NOTE — ED Provider Notes (Cosign Needed)
[facility] EMERGENCY DEPARTMENT Provider Note   CSN: [id number] Arrival date & time: 08/24/21  1720     History  Chief Complaint  Patient presents with   Neck Pain    Denise Cline is a 81 y.o. female.   Neck Pain  Patient is an 81 year old female present emergency room today for neck pain that started 3 days ago.  She states it has been severely painful feels it is very deep she denies any nausea vomiting chest pain or difficulty breathing.  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  She denies any vision changes or neck or head trauma.  No other associate symptoms.    Home Medications Prior to Admission medications   Medication Sig Start Date End Date Taking? Authorizing Provider  acetaminophen (TYLENOL) 650 MG CR tablet Take 1,300 mg by mouth 2 (two) times a day.    [provider]  aspirin EC 81 MG tablet Take 81 mg by mouth daily. Swallow whole.    [provider]  Biotin 5 MG TABS Take by mouth.    [provider]  buPROPion (WELLBUTRIN SR) 150 MG 12 hr tablet Take 150 mg by mouth 2 (two) times daily.     [provider]  calcium-vitamin D (OSCAL WITH D) 250-125 MG-UNIT tablet Take 2 tablets by mouth daily.    [provider]  Colesevelam HCl 3.75 g PACK Take by mouth.    [provider]  DOXYCYCLINE HYCLATE PO Take 100 mg by mouth.    [provider]  hydrochlorothiazide (HYDRODIURIL) 25 MG tablet Take 25 mg by mouth daily.  02/05/18   [provider]  losartan (COZAAR) 100 MG tablet Take 100 mg by mouth daily. 02/05/18   [provider]  Magnesium 250 MG TABS Take by mouth.    [provider]  metoprolol succinate (TOPROL-XL) 25 MG 24 hr tablet TAKE 1/2 TABLET BY MOUTH EVERY EVENING 02/15/21   Belva Crome, MD  Multiple Vitamins-Minerals (CENTRUM SILVER ADULT 50+) TABS Take 1 tablet by mouth  daily.    [provider]  pantoprazole (PROTONIX) 40 MG tablet TAKE ONE TABLET BY MOUTH EVERY EVENING 01/23/21   Belva Crome, MD  polyethylene glycol [facility] Floria Raveling) packet Take 17 g by mouth daily. 11/07/17   Norm Parcel, PA-C  rivaroxaban (XARELTO) 20 MG TABS tablet Take 20 mg by mouth daily with supper.    [provider]  rosuvastatin (CRESTOR) 20 MG tablet TAKE ONE TABLET BY MOUTH EVERY EVENING 01/23/21   Belva Crome, MD  saccharomyces boulardii (FLORASTOR) 250 MG capsule Take 250 mg by mouth 2 (two) times daily.    [provider]  sertraline (ZOLOFT) 50 MG tablet TAKE 1/2 TABLET BY MOUTH ONCE A DAY WITH MEALS FOR 7 DAYS, THEN 1 TAB DAILY 02/12/19   [provider]  traMADol (ULTRAM) 50 MG tablet Take 1 tablet (50 mg total) by mouth every 6 (six) hours as needed for moderate pain. 08/23/15   Mesner, Corene Cornea, MD  traZODone (DESYREL) 50 MG tablet Take 50 mg by mouth at bedtime.    [provider]  triamcinolone (KENALOG) 0.025 % cream Apply 1 application topically daily as needed for itching. 03/29/17   [provider]  Vitamin D, Ergocalciferol, (DRISDOL) 1.25 MG (50000 UNIT) CAPS capsule TAKE ONE CAPSULE BY MOUTH ONCE WEEKLY ON  MONDAY 02/23/21   Dennard Nip D, MD      Allergies    Penicillins, Sulfa antibiotics, Ace inhibitors, Atorvastatin, Citrus, Oxycodone, Sulfamethoxazole-trimethoprim, and Vicodin [hydrocodone-acetaminophen]    Review of Systems   Review of Systems  Musculoskeletal:  Positive for neck pain.    Physical Exam Updated Vital Signs BP (!) 148/81   Pulse 66   Temp 97.7 F (36.5 C) (Oral)   Resp 15   SpO2 98%  Physical Exam Vitals and nursing note reviewed.  Constitutional:      General: She is not in acute distress.    Comments: Uncomfortable, but in NAD  HENT:     Head: Normocephalic and atraumatic.     Nose: Nose normal.  Eyes:     General: No scleral icterus. Cardiovascular:     Rate  and Rhythm: Normal rate and regular rhythm.     Pulses: Normal pulses.     Heart sounds: Normal heart sounds.  Pulmonary:     Effort: Pulmonary effort is normal. No respiratory distress.     Breath sounds: No wheezing.  Abdominal:     Palpations: Abdomen is soft.     Tenderness: There is no abdominal tenderness.  Musculoskeletal:     Cervical back: Normal range of motion.     Right lower leg: No edema.     Left lower leg: No edema.  Skin:    General: Skin is warm and dry.     Capillary Refill: Capillary refill takes less than 2 seconds.  Neurological:     Mental Status: She is alert. Mental status is at baseline.     Comments: Alert and oriented to self, place, time and event.   Speech is fluent, clear without dysarthria or dysphasia.   Strength 5/5 in upper/lower extremities   Sensation intact in upper/lower extremities    CN I not tested  CN II grossly intact visual fields bilaterally. Did not visualize posterior eye.  CN III, IV, VI PERRLA and EOMs intact bilaterally  CN V Intact sensation to sharp and light touch to the face  CN VII facial movements symmetric  CN VIII not tested  CN IX, X no uvula deviation, symmetric rise of soft palate  CN XI 5/5 SCM and trapezius strength bilaterally  CN XII Midline tongue protrusion, symmetric L/R movements   Psychiatric:        Mood and Affect: Mood normal.        Behavior: Behavior normal.     ED Results / Procedures / Treatments   Labs (all labs ordered are listed, but only abnormal results are displayed) Labs Reviewed  BASIC METABOLIC PANEL - Abnormal; Notable for the following components:      Result Value   Glucose, Bld 103 (*)    Creatinine, Ser 1.04 (*)    GFR, Estimated 53 (*)    All other components within normal limits  CBC WITH DIFFERENTIAL/PLATELET    EKG EKG Interpretation  Date/Time:  Thursday August 24 2021 18:08:16 EDT Ventricular Rate:  72 PR Interval:  154 QRS Duration: 72 QT Interval:  404 QTC  Calculation: 442 R Axis:   0 Text Interpretation: Normal sinus rhythm Anterior infarct , age undetermined Abnormal ECG When compared with ECG of 29-Jul-2018 21:53, PREVIOUS ECG IS PRESENT Confirmed by Octaviano Glow [phone]) on 08/24/2021 10:44:52 PM  Radiology DG Cervical Spine Complete  Result Date: 08/24/2021 CLINICAL DATA:  Neck pain and back pain. EXAM: CERVICAL SPINE - COMPLETE 4+ VIEW COMPARISON:  None  Available. FINDINGS: There is straightening of normal cervical lordosis. There is no evidence of cervical spine fracture or prevertebral soft tissue swelling. Alignment is normal. There is mild disc space narrowing and endplate osteophyte formation at C4-C5 and C5-C6 compatible with degenerative change. IMPRESSION: 1. No evidence for fracture or malalignment. 2. Mild to moderate degenerative changes at C4-C5 and C5-C6. Electronically Signed   By: Ronney Asters M.D.   On: 08/24/2021 18:40   DG Chest 2 View  Result Date: 08/24/2021 CLINICAL DATA:  Back pain with neck pain. EXAM: CHEST - 2 VIEW COMPARISON:  Chest x-ray 10/15/2018 FINDINGS: The heart size and mediastinal contours are within normal limits. Both lungs are clear. The visualized skeletal structures are unremarkable. There surgical clips in the right upper abdomen. IMPRESSION: No active cardiopulmonary disease. Electronically Signed   By: Ronney Asters M.D.   On: 08/24/2021 18:39    Procedures Procedures    Medications Ordered in ED Medications  lidocaine (LIDODERM) 5 % 1 patch (1 patch Transdermal Patch Applied 08/24/21 2329)  LORazepam (ATIVAN) tablet 0.5 mg (0.5 mg Oral Given 08/24/21 2328)  acetaminophen (TYLENOL) tablet 1,000 mg (1,000 mg Oral Given 08/24/21 2329)    ED Course/ Medical Decision Making/ A&P                           Medical Decision Making Amount and/or Complexity of Data Reviewed Radiology: ordered.  Risk OTC drugs. Prescription drug management.   Patient is an 81 year old female present emergency  room today for neck pain that started 3 days ago.  She states it has been severely painful feels it is very deep she denies any nausea vomiting chest pain or difficulty breathing.  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  She denies any vision changes or neck or head trauma.  No other associate symptoms.  Physical exam without any neurologic abnormalities.  Patient seems quite uncomfortable   I discussed this case with my attending physician who cosigned this note including patient's presenting symptoms, physical exam, and planned diagnostics and interventions. Attending physician stated agreement with plan or made changes to plan which were implemented.   Attending physician assessed patient at bedside.    We will obtain CT angio head and neck due to concern for vertebral artery dissection.  Labs unremarkable.  Patient care handed off to [facility] for follow-up on CT angio head and neck.  If negative discharge home with torticollis discharge instructions.  Final Clinical Impression(s) / ED Diagnoses Final diagnoses:  Neck pain  Torticollis    Rx / DC Orders ED Discharge Orders     None         Tedd Sias, [place] 08/24/21 2343

## 2021-08-24 NOTE — Discharge Instructions (Addendum)
Your CT scans were without any abnormal findings.  Please read the attached information  Neck massages, Tylenol 1000 mg every 6 hours and gentle massage can be helpful for this condition.  Please follow-up with your primary care provider.  Please return the emergency room for any new or concerning symptoms.

## 2021-08-25 ENCOUNTER — Emergency Department (HOSPITAL_COMMUNITY): Payer: Medicare HMO

## 2021-08-25 ENCOUNTER — Other Ambulatory Visit: Payer: Self-pay

## 2021-08-25 ENCOUNTER — Encounter (HOSPITAL_COMMUNITY): Payer: Self-pay

## 2021-08-25 MED ORDER — LIDOCAINE 5 % EX PTCH: 1.0000 | MEDICATED_PATCH | CUTANEOUS | 0 refills | Status: AC

## 2021-08-25 MED ORDER — IOHEXOL 350 MG/ML SOLN
65.0000 mL | Freq: Once | INTRAVENOUS | Status: AC | PRN
  Administered 2021-08-25: 65 mL via INTRAVENOUS

## 2021-09-15 ENCOUNTER — Other Ambulatory Visit (INDEPENDENT_AMBULATORY_CARE_PROVIDER_SITE_OTHER): Payer: Self-pay | Admitting: Family Medicine

## 2021-09-15 DIAGNOSIS — E559 Vitamin D deficiency, unspecified: Secondary | ICD-10-CM

## 2021-09-28 ENCOUNTER — Other Ambulatory Visit (INDEPENDENT_AMBULATORY_CARE_PROVIDER_SITE_OTHER): Payer: Self-pay | Admitting: Family Medicine

## 2021-09-28 DIAGNOSIS — E559 Vitamin D deficiency, unspecified: Secondary | ICD-10-CM

## 2021-09-29 ENCOUNTER — Ambulatory Visit: Payer: Medicare HMO | Admitting: Vascular Surgery

## 2021-09-29 ENCOUNTER — Encounter: Payer: Self-pay | Admitting: Vascular Surgery

## 2021-09-29 VITALS — BP 103/74 | HR 54 | Temp 97.9°F | Resp 20 | Ht 66.0 in | Wt 193.0 lb

## 2021-09-29 DIAGNOSIS — I6521 Occlusion and stenosis of right carotid artery: Secondary | ICD-10-CM | POA: Diagnosis not present

## 2021-09-29 NOTE — Progress Notes (Signed)
Office Note     CC: Carotid atherosclerotic disease Requesting Provider:  Josetta Huddle, MD  HPI: Denise Cline is a 81 y.o. (October 20, 1940) female presenting at the request of .Josetta Huddle, MD for carotid atherosclerotic disease.  Patient recently underwent CT angiogram of the neck and head for left neck pain.  Results demonstrated some mild atherosclerotic disease appreciated in bilateral carotid bulbs.  On exam today, Denise Cline was doing well.  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  She denies history of TIA, stroke, amaurosis.  Denied claudication, ischemic rest pain, tissue loss.   Past Medical History:  Diagnosis Date   Acute colitis    Anxiety    B12 deficiency    Bilateral swelling of feet    Constipation    COPD (chronic obstructive pulmonary disease) (HCC)    Depression    DVT (deep venous thrombosis) ([place]) 2014, 2017   Fibroid    Gallbladder problem    Heart valve problem    High cholesterol    History of shingles    Hypertension    IBS (irritable bowel syndrome)    Joint pain    PE (pulmonary thromboembolism) ([place]) 2017   SOB (shortness of breath)    Thyroid disease    Nodules   Varicose veins    Vitamin D deficiency     Past Surgical History:  Procedure Laterality Date   ABDOMINAL HYSTERECTOMY     CHOLECYSTECTOMY N/A 11/05/2017   Procedure: LAPAROSCOPIC CHOLECYSTECTOMY WITH INTRAOPERATIVE CHOLANGIOGRAM;  Surgeon: Jovita Kussmaul, MD;  Location: [facility];  Service: General;  Laterality: N/A;   COLONOSCOPY N/A 10/05/2014   Procedure: COLONOSCOPY;  Surgeon: Ronald Lobo, MD;  Location: [facility];  Service: Endoscopy;  Laterality: N/A;   ERCP N/A 11/04/2017   Procedure: ENDOSCOPIC RETROGRADE CHOLANGIOPANCREATOGRAPHY (ERCP) Balloon Extraction;  Surgeon: Ronnette Juniper, MD;  Location: [facility];  Service: Gastroenterology;  Laterality: N/A;   LAPAROSCOPIC CHOLECYSTECTOMY W/ CHOLANGIOGRAPHY  11/05/2017   Dr. Marlou Starks    OOPHORECTOMY     One ovary removed   SPHINCTEROTOMY  11/04/2017   Procedure: SPHINCTEROTOMY;  Surgeon: Ronnette Juniper, MD;  Location: [facility] ENDOSCOPY;  Service: Gastroenterology;;   TOTAL THYROIDECTOMY      Social History   Socioeconomic History   Marital status: Single    Spouse name: Not on file   Number of children: 3   Years of education: Not on file   Highest education level: Not on file  Occupational History   Not on file  Tobacco Use   Smoking status: Former    Packs/day: 2.00    Years: 40.00    Total pack years: 80.00    Types: Cigarettes    Quit date: 05/30/2008    Years since quitting: 13.3   Smokeless tobacco: Former    Quit date: 05/12/2008   Tobacco comments:    Counseled to remain smoke free  Substance and Sexual Activity   Alcohol use: No    Alcohol/week: 0.0 standard drinks of alcohol    Comment: Rare   Drug use: No   Sexual activity: Not Currently    Birth control/protection: Surgical  Other Topics Concern   Not on file  Social History Narrative   Not on file   Social Determinants of Health   Financial Resource Strain: Not on file  Food Insecurity: Not on file  Transportation Needs: Not on file  Physical Activity: Not on file  Stress: Not on file  Social  Connections: Not on file  Intimate Partner Violence: Not on file   Family History  Problem Relation Age of Onset   Crohn's disease Mother    Hypertension Father    Stroke Father    Sudden death Father    Diabetes Sister    Hypertension Sister    Hypertension Brother     Current Outpatient Medications  Medication Sig Dispense Refill   acetaminophen (TYLENOL) 650 MG CR tablet Take 1,300 mg by mouth 2 (two) times a day.     aspirin EC 81 MG tablet Take 81 mg by mouth daily. Swallow whole.     Biotin 5 MG TABS Take by mouth.     buPROPion (WELLBUTRIN SR) 150 MG 12 hr tablet Take 150 mg by mouth 2 (two) times daily.      calcium-vitamin D (OSCAL WITH D) 250-125 MG-UNIT tablet Take 2 tablets by  mouth daily.     Colesevelam HCl 3.75 g PACK Take by mouth.     DOXYCYCLINE HYCLATE PO Take 100 mg by mouth.     hydrochlorothiazide (HYDRODIURIL) 25 MG tablet Take 25 mg by mouth daily.      lidocaine (LIDODERM) 5 % Place 1 patch onto the skin daily. Remove & Discard patch within 12 hours or as directed by MD 10 patch 0   losartan (COZAAR) 100 MG tablet Take 100 mg by mouth daily.     Magnesium 250 MG TABS Take by mouth.     metoprolol succinate (TOPROL-XL) 25 MG 24 hr tablet TAKE 1/2 TABLET BY MOUTH EVERY EVENING 45 tablet 1   Multiple Vitamins-Minerals (CENTRUM SILVER ADULT 50+) TABS Take 1 tablet by mouth daily.     pantoprazole (PROTONIX) 40 MG tablet TAKE ONE TABLET BY MOUTH EVERY EVENING 90 tablet 3   polyethylene glycol (MIRALAX / GLYCOLAX) packet Take 17 g by mouth daily. 14 each 0   rivaroxaban (XARELTO) 20 MG TABS tablet Take 20 mg by mouth daily with supper.     rosuvastatin (CRESTOR) 20 MG tablet TAKE ONE TABLET BY MOUTH EVERY EVENING 90 tablet 3   saccharomyces boulardii (FLORASTOR) 250 MG capsule Take 250 mg by mouth 2 (two) times daily.     sertraline (ZOLOFT) 50 MG tablet TAKE 1/2 TABLET BY MOUTH ONCE A DAY WITH MEALS FOR 7 DAYS, THEN 1 TAB DAILY     traMADol (ULTRAM) 50 MG tablet Take 1 tablet (50 mg total) by mouth every 6 (six) hours as needed for moderate pain. 30 tablet 0   traZODone (DESYREL) 50 MG tablet Take 50 mg by mouth at bedtime.     triamcinolone (KENALOG) 0.025 % cream Apply 1 application topically daily as needed for itching.     Vitamin D, Ergocalciferol, (DRISDOL) 1.25 MG (50000 UNIT) CAPS capsule TAKE ONE CAPSULE BY MOUTH ONCE WEEKLY ON MONDAY 4 capsule 5   No current facility-administered medications for this visit.    Allergies  Allergen Reactions   Penicillins Anaphylaxis    Has patient had a PCN reaction causing immediate rash, facial/tongue/throat swelling, SOB or lightheadedness with hypotension: yes Has patient had a PCN reaction causing severe  rash involving mucus membranes or skin necrosis: no Has patient had a PCN reaction that required hospitalization yes Has patient had a PCN reaction occurring within the last 10 years: no If all of the above answers are "NO", then may proceed with Cephalosporin use.    Sulfa Antibiotics Hives   Ace Inhibitors Other (See Comments)   Atorvastatin Other (See  Comments)   Citrus Itching and Other (See Comments)    Can only eat if taking Benadryl   Oxycodone Other (See Comments)    Hallucinations    Sulfamethoxazole-Trimethoprim Other (See Comments)   Vicodin [Hydrocodone-Acetaminophen] Other (See Comments)    "I don't know where I am"     REVIEW OF SYSTEMS:  '[X]'$  denotes positive finding, '[ ]'$  denotes negative finding Cardiac  Comments:  Chest pain or chest pressure:    Shortness of breath upon exertion:    Short of breath when lying flat:    Irregular heart rhythm:        Vascular    Pain in calf, thigh, or hip brought on by ambulation:    Pain in feet at night that wakes you up from your sleep:     Blood clot in your veins:    Leg swelling:         Pulmonary    Oxygen at home:    Productive cough:     Wheezing:         Neurologic    Sudden weakness in arms or legs:     Sudden numbness in arms or legs:     Sudden onset of difficulty speaking or slurred speech:    Temporary loss of vision in one eye:     Problems with dizziness:         Gastrointestinal    Blood in stool:     Vomited blood:         Genitourinary    Burning when urinating:     Blood in urine:        Psychiatric    Major depression:         Hematologic    Bleeding problems:    Problems with blood clotting too easily:        Skin    Rashes or ulcers:        Constitutional    Fever or chills:      PHYSICAL EXAMINATION:  Vitals:   09/29/21 1420 09/29/21 1423  BP: 102/70 103/74  Pulse: (!) 54   Resp: 20   Temp: 97.9 F (36.6 C)   SpO2: 96%   Weight: 193 lb (87.5 kg)   Height: '5\' 6"'$   (1.676 m)     General:  WDWN in NAD; vital signs documented above Gait: Not observed HENT: WNL, normocephalic Pulmonary: normal non-labored breathing , without wheezing Cardiac: regular HR,  Abdomen: soft, NT, no masses Skin: without rashes Vascular Exam/Pulses:  Right Left  Radial 2+ (normal) 2+ (normal)  Ulnar 2+ (normal) 2+ (normal)  Femoral    Popliteal    DP 2+ (normal) 2+ (normal)       Extremities: without ischemic changes, without Gangrene , without cellulitis; without open wounds;  Musculoskeletal: no muscle wasting or atrophy  Neurologic: A&O X 3;  No focal weakness or paresthesias are detected Psychiatric:  The pt has Normal affect.   Non-Invasive Vascular Imaging:    RIGHT CAROTID SYSTEM: No dissection, occlusion or aneurysm. Mild atherosclerotic calcification at the carotid bifurcation without hemodynamically significant stenosis.   LEFT CAROTID SYSTEM: Normal without aneurysm, dissection or stenosis.    ASSESSMENT/PLAN: TISHIE ALTMANN is a 81 y.o. female presenting with mild atherosclerotic calcification at the right carotid bifurcation.  Previous CTA on 08/25/2021 was reviewed.  There is no stenosis appreciated.  This does not need to be followed further.  Regarding her neuropathy, there is not a vascular etiology.  She  has a palpable pulse in her feet.  I discussed the above with Denise Cline.  I told her I am happy to see again in the office should any questions or concerns arise.    Broadus John, MD [facility] [phone]

## 2021-10-11 ENCOUNTER — Encounter (INDEPENDENT_AMBULATORY_CARE_PROVIDER_SITE_OTHER): Payer: Self-pay

## 2021-10-18 ENCOUNTER — Other Ambulatory Visit (INDEPENDENT_AMBULATORY_CARE_PROVIDER_SITE_OTHER): Payer: Self-pay | Admitting: Family Medicine

## 2021-10-18 DIAGNOSIS — E559 Vitamin D deficiency, unspecified: Secondary | ICD-10-CM

## 2021-11-16 ENCOUNTER — Other Ambulatory Visit: Payer: Self-pay | Admitting: Interventional Cardiology

## 2021-12-04 ENCOUNTER — Other Ambulatory Visit: Payer: Self-pay | Admitting: Surgery

## 2021-12-04 DIAGNOSIS — I712 Thoracic aortic aneurysm, without rupture, unspecified: Secondary | ICD-10-CM

## 2021-12-18 ENCOUNTER — Other Ambulatory Visit: Payer: Self-pay | Admitting: Interventional Cardiology

## 2022-01-15 ENCOUNTER — Other Ambulatory Visit: Payer: Self-pay | Admitting: Interventional Cardiology

## 2022-01-22 ENCOUNTER — Other Ambulatory Visit: Payer: Self-pay | Admitting: Interventional Cardiology

## 2022-01-29 ENCOUNTER — Encounter: Payer: Self-pay | Admitting: Surgery

## 2022-01-31 ENCOUNTER — Ambulatory Visit: Payer: Medicare HMO | Admitting: Surgery

## 2022-01-31 ENCOUNTER — Ambulatory Visit
Admission: RE | Admit: 2022-01-31 | Discharge: 2022-01-31 | Disposition: A | Discharge status: Home / Self Care | Payer: Medicare HMO | Source: Ambulatory Visit | Attending: Surgery | Admitting: Surgery

## 2022-01-31 ENCOUNTER — Encounter: Payer: Self-pay | Admitting: Surgery

## 2022-01-31 VITALS — BP 147/97 | HR 66 | Resp 20 | Ht 66.0 in | Wt 197.0 lb

## 2022-01-31 DIAGNOSIS — I712 Thoracic aortic aneurysm, without rupture, unspecified: Secondary | ICD-10-CM

## 2022-01-31 NOTE — Progress Notes (Signed)
HPI:  The patient is a 81 year old woman who returns for follow-up of a 4.3 cm fusiform ascending aortic aneurysm.  Since I saw her last year she has continued to feel fairly well.   She denies any chest pain or shortness of breath.  Current Outpatient Medications  Medication Sig Dispense Refill   acetaminophen (TYLENOL) 650 MG CR tablet Take 1,300 mg by mouth 2 (two) times a day.     aspirin EC 81 MG tablet Take 81 mg by mouth daily. Swallow whole.     Biotin 5 MG TABS Take by mouth.     buPROPion (WELLBUTRIN SR) 150 MG 12 hr tablet Take 150 mg by mouth 2 (two) times daily.      calcium-vitamin D (OSCAL WITH D) 250-125 MG-UNIT tablet Take 2 tablets by mouth daily.     Colesevelam HCl 3.75 g PACK Take by mouth.     DOXYCYCLINE HYCLATE PO Take 100 mg by mouth.     hydrochlorothiazide (HYDRODIURIL) 25 MG tablet Take 25 mg by mouth daily.      lidocaine (LIDODERM) 5 % Place 1 patch onto the skin daily. Remove & Discard patch within 12 hours or as directed by MD 10 patch 0   losartan (COZAAR) 100 MG tablet Take 100 mg by mouth daily.     Magnesium 250 MG TABS Take by mouth.     metoprolol succinate (TOPROL-XL) 25 MG 24 hr tablet TAKE 1/2 TABLET BY MOUTH EVERY EVENING 12 tablet 0   Multiple Vitamins-Minerals (CENTRUM SILVER ADULT 50+) TABS Take 1 tablet by mouth daily.     pantoprazole (PROTONIX) 40 MG tablet TAKE ONE TABLET BY MOUTH EVERY EVENING 90 tablet 3   polyethylene glycol (MIRALAX / GLYCOLAX) packet Take 17 g by mouth daily. 14 each 0   rivaroxaban (XARELTO) 20 MG TABS tablet Take 20 mg by mouth daily with supper.     rosuvastatin (CRESTOR) 20 MG tablet TAKE ONE TABLET BY MOUTH EVERY EVENING 90 tablet 3   saccharomyces boulardii (FLORASTOR) 250 MG capsule Take 250 mg by mouth 2 (two) times daily.     sertraline (ZOLOFT) 50 MG tablet TAKE 1/2 TABLET BY MOUTH ONCE A DAY WITH MEALS FOR 7 DAYS, THEN 1 TAB DAILY     traMADol (ULTRAM) 50 MG tablet Take 1 tablet (50 mg total) by mouth  every 6 (six) hours as needed for moderate pain. 30 tablet 0   traZODone (DESYREL) 50 MG tablet Take 50 mg by mouth at bedtime.     triamcinolone (KENALOG) 0.025 % cream Apply 1 application topically daily as needed for itching.     Vitamin D, Ergocalciferol, (DRISDOL) 1.25 MG (50000 UNIT) CAPS capsule TAKE ONE CAPSULE BY MOUTH ONCE WEEKLY ON MONDAY 4 capsule 5   No current facility-administered medications for this visit.     Physical Exam: BP (!) 147/97 (BP Location: Left Arm, Patient Position: Sitting)   Pulse 66   Resp 20   Ht '5\' 6"'$  (1.676 m)   Wt 197 lb (89.4 kg)   SpO2 97% Comment: RA  BMI 31.80 kg/m  She looks well. Cardiac exam shows a regular rate and rhythm with normal heart sounds.  There is no murmur. Lungs are clear.  Diagnostic Tests:  CT CHEST WITHOUT CONTRAST  TECHNIQUE: Multidetector CT imaging of the chest was performed following the standard protocol without IV contrast.  RADIATION DOSE REDUCTION: This exam was performed according to the departmental dose-optimization program which includes automated exposure control,  adjustment of the mA and/or kV according to patient size and/or use of iterative reconstruction technique.  COMPARISON: Chest CTA 01/25/2021 and 12/30/2019.  FINDINGS: Cardiovascular: Fusiform dilatation of the ascending aorta is again noted with a maximal diameter of 4.4 cm (previously 4.2 cm, remeasured). Relatively mild atherosclerosis of the aorta, great vessels and coronary arteries again noted. There is stable central enlargement of the pulmonary arteries and mild cardiac enlargement. No significant pericardial fluid.  Mediastinum/Nodes: Previously demonstrated asymmetric prominent right axillary and subpectoral lymph nodes are stable, measuring up to 1.6 cm short axis on image 72/2. There are no enlarged mediastinal, hilar or left axillary lymph nodes. The previously biopsied right thyroid nodule is grossly stable, measuring up  to 2.7 cm on image 25/2.  Lungs/Pleura: No pleural effusion or pneumothorax. Scattered thin-walled air cysts are unchanged including a 2.3 cm one in the right lower lobe which has globular calcifications within its wall. There are additional scattered calcified granulomas. No suspicious pulmonary nodule.  Upper abdomen: The visualized upper abdomen appears unchanged following cholecystectomy. No suspicious findings identified.  Musculoskeletal/Chest wall: There is no chest wall mass or suspicious osseous finding.  IMPRESSION: 1. Stable fusiform dilatation of the ascending thoracic aorta with maximal diameter of 4.4 cm. Consider continued follow-up every 12 months. This recommendation follows ACR consensus guidelines: White Paper of the ACR Incidental Findings Committee II on Vascular Findings. J Am Coll Radiol 2013; 10:789-794. 2. Stable asymmetric prominent right axillary and subpectoral lymph nodes, likely reactive based on stability. 3. Stable central enlargement of the pulmonary arteries, suggesting underlying pulmonary arterial hypertension. 4. Stable scattered thin-walled air cysts in both lungs. No suspicious pulmonary nodule. 5. Aortic Atherosclerosis (ICD10-I70.0).   Electronically Signed By: Richardean Sale M.D. On: 01/31/2022 12:26   Impression:  This 81 year old woman has a 4.4 cm fusiform ascending aortic aneurysm that is slightly larger than it was 1 year ago when it was remeasured at 4.2 cm.  This is still well below the surgical threshold of 5.5 cm.  Previous echocardiogram has shown a trileaflet aortic valve.  I reviewed the CT images with her and answered her questions.  I stressed the importance of continued good blood pressure control in preventing further enlargement and acute aortic dissection.  Plan:  I will plan to see her back in 1 year with a CTA of the chest.  I spent 20 minutes performing this established patient evaluation and > 50% of this time  was spent face to face counseling and coordinating the care of this patient's aortic aneurysm.    Gaye Pollack, MD [facility] [phone]

## 2022-02-14 ENCOUNTER — Other Ambulatory Visit: Payer: Self-pay | Admitting: Interventional Cardiology

## 2022-02-14 NOTE — Progress Notes (Unsigned)
Cardiology Office Note:    Date:  02/15/2022   ID:  Denise Cline, DOB 1940-08-08, MRN [id number]  PCP:  Josetta Huddle, MD  Cardiologist:  Sinclair Grooms, MD   Referring MD: Josetta Huddle, MD   Chief Complaint  Patient presents with   Coronary Artery Disease   Follow-up    COPD Hypertension Hyperlipidemia Chronic anticoagulation Asymptomatic CAD Ascending aortic aneurysm    History of Present Illness:    Denise Cline is a 81 y.o. 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   She has no complaints related to her cardiovascular system.  Denies orthopnea, PND, lower extremity swelling, palpitations, and syncope.  She also has a copy of her office note that was generated when she saw Dr. Cyndia Bent.  She denies chest pain.  Past Medical History:  Diagnosis Date   Acute colitis    Anxiety    B12 deficiency    Bilateral swelling of feet    Constipation    COPD (chronic obstructive pulmonary disease) (HCC)    Depression    DVT (deep venous thrombosis) ([place]) 2014, 2017   Fibroid    Gallbladder problem    Heart valve problem    High cholesterol    History of shingles    Hypertension    IBS (irritable bowel syndrome)    Joint pain    PE (pulmonary thromboembolism) ([place]) 2017   SOB (shortness of breath)    Thyroid disease    Nodules   Varicose veins    Vitamin D deficiency     Past Surgical History:  Procedure Laterality Date   ABDOMINAL HYSTERECTOMY     CHOLECYSTECTOMY N/A 11/05/2017   Procedure: LAPAROSCOPIC CHOLECYSTECTOMY WITH INTRAOPERATIVE CHOLANGIOGRAM;  Surgeon: Jovita Kussmaul, MD;  Location: [facility];  Service: General;  Laterality: N/A;   COLONOSCOPY N/A 10/05/2014   Procedure: COLONOSCOPY;   Surgeon: Ronald Lobo, MD;  Location: [facility] ENDOSCOPY;  Service: Endoscopy;  Laterality: N/A;   ERCP N/A 11/04/2017   Procedure: ENDOSCOPIC RETROGRADE CHOLANGIOPANCREATOGRAPHY (ERCP) Balloon Extraction;  Surgeon: Ronnette Juniper, MD;  Location: [facility];  Service: Gastroenterology;  Laterality: N/A;   LAPAROSCOPIC CHOLECYSTECTOMY W/ CHOLANGIOGRAPHY  11/05/2017   Dr. Marlou Starks   OOPHORECTOMY     One ovary removed   SPHINCTEROTOMY  11/04/2017   Procedure: SPHINCTEROTOMY;  Surgeon: Ronnette Juniper, MD;  Location: [facility] ENDOSCOPY;  Service: Gastroenterology;;   TOTAL THYROIDECTOMY      Current Medications: Current Meds  Medication Sig   acetaminophen (TYLENOL) 650 MG CR tablet Take 1,300 mg by mouth 2 (two) times a day.   aspirin EC 81 MG tablet Take 81 mg by mouth daily. Swallow whole.   Biotin 5 MG TABS Take by mouth.   buPROPion (WELLBUTRIN SR) 150 MG 12 hr tablet Take 150 mg by mouth 2 (two) times daily.    calcium-vitamin D (OSCAL WITH D) 250-125 MG-UNIT tablet Take 2 tablets by mouth daily.   Colesevelam HCl 3.75 g PACK Take by mouth.   hydrochlorothiazide (HYDRODIURIL) 25 MG tablet Take 25 mg by mouth daily.    lidocaine (LIDODERM) 5 % Place 1 patch onto the skin daily. Remove & Discard patch within 12 hours or as directed by MD   losartan (COZAAR) 100 MG tablet Take  100 mg by mouth daily.   Magnesium 250 MG TABS Take by mouth.   Multiple Vitamins-Minerals (CENTRUM SILVER ADULT 50+) TABS Take 1 tablet by mouth daily.   pantoprazole (PROTONIX) 40 MG tablet TAKE ONE TABLET BY MOUTH EVERY EVENING   polyethylene glycol (MIRALAX / GLYCOLAX) packet Take 17 g by mouth daily.   rivaroxaban (XARELTO) 20 MG TABS tablet Take 20 mg by mouth daily with supper.   rosuvastatin (CRESTOR) 20 MG tablet TAKE ONE TABLET BY MOUTH EVERY EVENING   saccharomyces boulardii (FLORASTOR) 250 MG capsule Take 250 mg by mouth 2 (two) times daily.   sertraline (ZOLOFT) 50 MG tablet TAKE 1/2 TABLET BY MOUTH ONCE A DAY WITH MEALS  FOR 7 DAYS, THEN 1 TAB DAILY   traMADol (ULTRAM) 50 MG tablet Take 1 tablet (50 mg total) by mouth every 6 (six) hours as needed for moderate pain.   traZODone (DESYREL) 50 MG tablet Take 50 mg by mouth at bedtime.   triamcinolone (KENALOG) 0.025 % cream Apply 1 application topically daily as needed for itching.   Vitamin D, Ergocalciferol, (DRISDOL) 1.25 MG (50000 UNIT) CAPS capsule TAKE ONE CAPSULE BY MOUTH ONCE WEEKLY ON MONDAY   [DISCONTINUED] metoprolol succinate (TOPROL-XL) 25 MG 24 hr tablet TAKE 1/2 TABLET BY MOUTH EVERY EVENING     Allergies:   Penicillins, Sulfa antibiotics, Ace inhibitors, Atorvastatin, Citrus, Oxycodone, Sulfamethoxazole-trimethoprim, and Vicodin [hydrocodone-acetaminophen]   Social History   Socioeconomic History   Marital status: Single    Spouse name: Not on file   Number of children: 3   Years of education: Not on file   Highest education level: Not on file  Occupational History   Not on file  Tobacco Use   Smoking status: Former    Packs/day: 2.00    Years: 40.00    Total pack years: 80.00    Types: Cigarettes    Quit date: 05/30/2008    Years since quitting: 13.7   Smokeless tobacco: Former    Quit date: 05/12/2008   Tobacco comments:    Counseled to remain smoke free  Substance and Sexual Activity   Alcohol use: No    Alcohol/week: 0.0 standard drinks of alcohol    Comment: Rare   Drug use: No   Sexual activity: Not Currently    Birth control/protection: Surgical  Other Topics Concern   Not on file  Social History Narrative   Not on file   Social Determinants of Health   Financial Resource Strain: Not on file  Food Insecurity: Not on file  Transportation Needs: Not on file  Physical Activity: Not on file  Stress: Not on file  Social Connections: Not on file     Family History: The patient's family history includes Crohn's disease in her mother; Diabetes in her sister; Hypertension in her brother, father, and sister; Stroke in  her father; Sudden death in her father.  ROS:   Please see the history of present illness.    Has been suffering with deep depression and received ECT and currently feels she is improved.  All other systems reviewed and are negative.  EKGs/Labs/Other Studies Reviewed:    The following studies were reviewed today: CT angiography of the aorta November 2023 IMPRESSION: 1. Stable fusiform dilatation of the ascending thoracic aorta with maximal diameter of 4.4 cm. Consider continued follow-up every 12 months. This recommendation follows ACR consensus guidelines: White Paper of the ACR Incidental Findings Committee II on Vascular Findings. J Am Coll Radiol 2013; 10:789-794.  2. Stable asymmetric prominent right axillary and subpectoral lymph nodes, likely reactive based on stability. 3. Stable central enlargement of the pulmonary arteries, suggesting underlying pulmonary arterial hypertension. 4. Stable scattered thin-walled air cysts in both lungs. No suspicious pulmonary nodule. 5.  Aortic Atherosclerosis (ICD10-I70.0).   EKG:  EKG sinus bradycardia 58 bpm, left atrial abnormality, relatively low voltage.  No significant abnormality noted.  Recent Labs: 08/24/2021: BUN 23; Creatinine, Ser 1.04; Hemoglobin 12.5; Platelets 217; Potassium 4.0; Sodium 137  Recent Lipid Panel    Component Value Date/Time   CHOL 142 11/02/2020 1022   TRIG 137 11/02/2020 1022   HDL 55 11/02/2020 1022   CHOLHDL 3.4 12/25/2017 0816   CHOLHDL 3.7 05/15/2009 0555   VLDL 22 05/15/2009 0555   LDLCALC 63 11/02/2020 1022    Physical Exam:    VS:  BP 114/72   Pulse (!) 58   Ht '5\' 6"'$  (1.676 m)   Wt 199 lb 12.8 oz (90.6 kg)   SpO2 96%   BMI 32.25 kg/m     Wt Readings from Last 3 Encounters:  02/15/22 199 lb 12.8 oz (90.6 kg)  01/31/22 197 lb (89.4 kg)  09/29/21 193 lb (87.5 kg)     GEN: Healthy. No acute distress HEENT: Normal NECK: No JVD. LYMPHATICS: No lymphadenopathy CARDIAC: No murmur. RRR  no gallop, or edema. VASCULAR:  Normal Pulses. No bruits. RESPIRATORY:  Clear to auscultation without rales, wheezing or rhonchi  ABDOMEN: Soft, non-tender, non-distended, No pulsatile mass, MUSCULOSKELETAL: No deformity  SKIN: Warm and dry NEUROLOGIC:  Alert and oriented x 3 PSYCHIATRIC:  Normal affect   ASSESSMENT:    1. Aneurysm of ascending aorta without rupture ([place])   2. Coronary artery calcification seen on CAT scan   3. OSA (obstructive sleep apnea)   4. Hyperlipidemia, unspecified hyperlipidemia type   5. Essential hypertension    PLAN:    In order of problems listed above:  Surveillance should be with non CT angiogram modality to limit radiation exposure.  She is set to follow-up with Dr. Cyndia Bent in 1 year. No chest pain.  Preventive therapy with blood pressure control and lipid control on rosuvastatin. We did not discuss sleep apnea Continue rosuvastatin. Blood pressure control with low-dose beta-blocker because of sinus bradycardia and losartan.  Follow-up Skains/Chandrashekar/Pemberton.   Medication Adjustments/Labs and Tests Ordered: Current medicines are reviewed at length with the patient today.  Concerns regarding medicines are outlined above.  Orders Placed This Encounter  Procedures   EKG 12-Lead   Meds ordered this encounter  Medications   metoprolol succinate (TOPROL-XL) 25 MG 24 hr tablet    Sig: TAKE 1/2 TABLET BY MOUTH EVERY EVENING    Dispense:  45 tablet    Refill:  3    Pt must keep upcoming appt in December 2023 with Dr. Tamala Julian before anymore refills. Thank you Final Attempt    Patient Instructions  Medication Instructions:  Your physician recommends that you continue on your current medications as directed. Please refer to the Current Medication list given to you today.  *If you need a refill on your cardiac medications before your next appointment, please call your pharmacy*  Follow-Up: At [facility], you and your health needs  are our priority.  As part of our continuing mission to provide you with exceptional heart care, we have created designated Provider Care Teams.  These Care Teams include your primary Cardiologist (physician) and Advanced Practice Providers (APPs -  Physician Assistants and Nurse Practitioners)  who all work together to provide you with the care you need, when you need it.  Your next appointment:   1 year(s)  The format for your next appointment:   In Person  Provider:   Candee Furbish, MD or Rudean Haskell, MD or Gwyndolyn Kaufman, MD   Important Information About Sugar         Signed, Sinclair Grooms, MD  02/15/2022 12:24 PM    [place]

## 2022-02-15 ENCOUNTER — Ambulatory Visit: Payer: Medicare HMO | Attending: Interventional Cardiology | Admitting: Interventional Cardiology

## 2022-02-15 ENCOUNTER — Encounter: Payer: Self-pay | Admitting: Interventional Cardiology

## 2022-02-15 VITALS — BP 114/72 | HR 58 | Ht 66.0 in | Wt 199.8 lb

## 2022-02-15 DIAGNOSIS — I10 Essential (primary) hypertension: Secondary | ICD-10-CM

## 2022-02-15 DIAGNOSIS — I7121 Aneurysm of the ascending aorta, without rupture: Secondary | ICD-10-CM | POA: Diagnosis not present

## 2022-02-15 DIAGNOSIS — E785 Hyperlipidemia, unspecified: Secondary | ICD-10-CM

## 2022-02-15 DIAGNOSIS — G4733 Obstructive sleep apnea (adult) (pediatric): Secondary | ICD-10-CM

## 2022-02-15 DIAGNOSIS — I2510 Atherosclerotic heart disease of native coronary artery without angina pectoris: Secondary | ICD-10-CM

## 2022-02-15 MED ORDER — METOPROLOL SUCCINATE ER 25 MG PO TB24: ORAL_TABLET | ORAL | 3 refills | Status: DC

## 2022-02-15 NOTE — Patient Instructions (Signed)
Medication Instructions:  Your physician recommends that you continue on your current medications as directed. Please refer to the Current Medication list given to you today.  *If you need a refill on your cardiac medications before your next appointment, please call your pharmacy*  Follow-Up: At [facility], you and your health needs are our priority.  As part of our continuing mission to provide you with exceptional heart care, we have created designated Provider Care Teams.  These Care Teams include your primary Cardiologist (physician) and Advanced Practice Providers (APPs -  Physician Assistants and Nurse Practitioners) who all work together to provide you with the care you need, when you need it.  Your next appointment:   1 year(s)  The format for your next appointment:   In Person  Provider:   Mark Skains, MD or Mahesh Chandrasekhar, MD or Heather Pemberton, MD  Important Information About Sugar       

## 2022-02-21 ENCOUNTER — Other Ambulatory Visit: Payer: Self-pay | Admitting: Interventional Cardiology

## 2022-03-08 ENCOUNTER — Other Ambulatory Visit: Payer: Self-pay | Admitting: Student

## 2022-03-08 DIAGNOSIS — E2839 Other primary ovarian failure: Secondary | ICD-10-CM

## 2022-03-09 ENCOUNTER — Other Ambulatory Visit: Payer: Self-pay | Admitting: Student

## 2022-03-09 DIAGNOSIS — K5900 Constipation, unspecified: Secondary | ICD-10-CM

## 2022-03-09 DIAGNOSIS — R197 Diarrhea, unspecified: Secondary | ICD-10-CM

## 2022-03-09 LAB — LAB REPORT - SCANNED
Albumin, Urine POC: 1.2
EGFR: 62
Microalb Creat Ratio: 12

## 2022-03-20 ENCOUNTER — Encounter: Payer: Self-pay | Admitting: Student

## 2022-03-21 ENCOUNTER — Ambulatory Visit
Admission: RE | Admit: 2022-03-21 | Discharge: 2022-03-21 | Disposition: A | Discharge status: Home / Self Care | Payer: Medicare HMO | Source: Ambulatory Visit | Attending: Student | Admitting: Student

## 2022-03-21 DIAGNOSIS — R197 Diarrhea, unspecified: Secondary | ICD-10-CM

## 2022-03-21 DIAGNOSIS — K5900 Constipation, unspecified: Secondary | ICD-10-CM

## 2022-03-21 MED ORDER — IOPAMIDOL (ISOVUE-300) INJECTION 61%
100.0000 mL | Freq: Once | INTRAVENOUS | Status: AC | PRN
  Administered 2022-03-21: 100 mL via INTRAVENOUS

## 2022-03-26 ENCOUNTER — Other Ambulatory Visit: Payer: Self-pay | Admitting: Student

## 2022-03-26 DIAGNOSIS — I70303 Unspecified atherosclerosis of unspecified type of bypass graft(s) of the extremities, bilateral legs: Secondary | ICD-10-CM

## 2022-04-03 ENCOUNTER — Ambulatory Visit
Admission: RE | Admit: 2022-04-03 | Discharge: 2022-04-03 | Disposition: A | Discharge status: Home / Self Care | Payer: Medicare HMO | Source: Ambulatory Visit | Attending: Student | Admitting: Student

## 2022-04-03 DIAGNOSIS — I70303 Unspecified atherosclerosis of unspecified type of bypass graft(s) of the extremities, bilateral legs: Secondary | ICD-10-CM

## 2022-04-05 ENCOUNTER — Telehealth: Payer: Self-pay | Admitting: *Deleted

## 2022-04-05 NOTE — Telephone Encounter (Signed)
   Patient Name: Denise Cline  DOB: 02/03/1941 MRN: [id number]  Primary Cardiologist: Sinclair Grooms, MD (Inactive)  Chart reviewed as part of pre-operative protocol coverage. Cataract extractions are recognized in guidelines as low risk surgeries that do not typically require specific preoperative testing or holding of blood thinner therapy. Therefore, given past medical history and time since last visit, based on ACC/AHA guidelines, Kymora L Noa would be at acceptable risk for the planned procedure without further cardiovascular testing.   I will route this recommendation to the requesting party via Epic fax function and remove from pre-op pool.  Please call with questions.  [place], [place] 04/05/2022, 4:40 PM

## 2022-04-05 NOTE — Telephone Encounter (Signed)
   Pre-operative Risk Assessment    Patient Name: Denise Cline  DOB: Jul 19, 1940 MRN: [id number]      Request for Surgical Clearance    Procedure:   CATARACT EXTRACTION WITH INTRAOCULAR LENS IMPLANT OF THE LEFT EYE; FOLLOWED BY THE RIGHT EYE  Date of Surgery:  Clearance 06/25/22                                 Surgeon:  DR. Rosalva Ferron Surgeon's Group or Practice Name:  [facility] Phone number:  [phone] Fax number:  [phone]   Type of Clearance Requested:   - Medical ; PER CLEARANCE FORM STATES NO NEED TO STOP ANY MEDICATIONS INCLUDING BLOOD THINNERS   Type of Anesthesia:   TOPICAL ANESTHESIA WITH IV MEDICATION   Additional requests/questions:    Jiles Prows   04/05/2022, 1:53 PM

## 2022-04-25 ENCOUNTER — Other Ambulatory Visit: Payer: Self-pay

## 2022-07-25 ENCOUNTER — Other Ambulatory Visit: Payer: Self-pay | Admitting: Student

## 2022-07-25 DIAGNOSIS — R2689 Other abnormalities of gait and mobility: Secondary | ICD-10-CM

## 2022-08-21 ENCOUNTER — Encounter: Payer: Self-pay | Admitting: Student

## 2022-08-27 ENCOUNTER — Other Ambulatory Visit: Payer: Medicare HMO

## 2022-08-28 ENCOUNTER — Inpatient Hospital Stay: Admission: RE | Admit: 2022-08-28 | Payer: Medicare HMO | Source: Ambulatory Visit

## 2022-08-31 ENCOUNTER — Ambulatory Visit
Admission: RE | Admit: 2022-08-31 | Discharge: 2022-08-31 | Disposition: A | Discharge status: Home / Self Care | Payer: Medicare HMO | Source: Ambulatory Visit | Attending: Student | Admitting: Student

## 2022-08-31 DIAGNOSIS — R2689 Other abnormalities of gait and mobility: Secondary | ICD-10-CM

## 2022-12-12 ENCOUNTER — Other Ambulatory Visit: Payer: Self-pay | Admitting: Surgery

## 2022-12-12 DIAGNOSIS — I712 Thoracic aortic aneurysm, without rupture, unspecified: Secondary | ICD-10-CM

## 2022-12-19 ENCOUNTER — Encounter: Payer: Self-pay | Admitting: Surgery

## 2023-01-09 NOTE — Progress Notes (Signed)
Office Visit Note  Patient: Denise Cline             Date of Birth: [birth year]           MRN: [id number]             PCP: [name], NP Referring: [name], NP Visit Date: 01/23/2023 Occupation: @GUAROCC @  Subjective:  Pain in multiple joints  History of Present Illness: Denise Cline is a 82 y.o. female seen in consultation per request of her PCP.  According the patient she has had pain and discomfort in her joints for more than 20 years.  She states the pain started in her knee joints initially and gradually got worse.  She states she had to retire due to knee joint discomfort and difficulty walking.  She states the pain has been progressively getting worse.  She also has discomfort in her right hip.  She has discomfort in the bilateral feet.  She states he has surgery on her bilateral feet for bunions and her fifth toe many years ago.  Despite of that she has ongoing pain.  She has discomfort in her left shoulder and bilateral hands.  She states her rings are tight in the morning but she has not noticed any visible swelling.  She denies discomfort in any other joints.  She gives history of dry eyes but denies history of dry mouth.  There is no history of oral ulcers, nasal ulcers, malar rash, photosensitivity, Raynaud's or lymphadenopathy.  She has noticed some hair thinning over the years.  Patient states she had DVT x 2 in the past and also pulmonary embolism.  She is on chronic anticoagulation.  She is gravida 3, para 3.  She smoked 1 to 2 packs/day for about 50 years.  She states that she quit smoking in 2010.    Activities of Daily Living:  Patient reports morning stiffness for 1-2 hours.   Patient Reports nocturnal pain.  Difficulty dressing/grooming: Reports Difficulty climbing stairs: Reports Difficulty getting out of chair: Reports Difficulty using hands for taps, buttons, cutlery, and/or writing: Reports  Review of Systems  Constitutional:  Positive for  fatigue.  HENT:  Positive for mouth dryness. Negative for mouth sores.   Eyes:  Positive for dryness.  Respiratory:  Positive for shortness of breath.   Cardiovascular:  Positive for irregular heartbeat. Negative for chest pain and palpitations.  Gastrointestinal:  Positive for constipation. Negative for blood in stool and diarrhea.  Endocrine: Negative for increased urination.  Genitourinary:  Positive for involuntary urination.  Musculoskeletal:  Positive for joint pain, joint pain, joint swelling, myalgias, muscle weakness, morning stiffness, muscle tenderness and myalgias.  Skin:  Positive for color change and hair loss. Negative for rash and sensitivity to sunlight.  Allergic/Immunologic: Negative for susceptible to infections.  Neurological:  Positive for tremors and numbness. Negative for dizziness and headaches.  Hematological:  Negative for swollen glands.  Psychiatric/Behavioral:  Positive for depressed mood and sleep disturbance. The patient is nervous/anxious.     PMFS History:  Patient Active Problem List   Diagnosis Date Noted   Choledocholithiasis with acute cholecystitis with obstruction 11/02/2017   Chronic kidney disease, stage 3 (HCC) 11/02/2017   DVT (deep venous thrombosis) (HCC) 11/12/2015   Pulmonary emboli (HCC) 11/11/2015   Acute pulmonary embolism (HCC) 11/11/2015   Coronary artery calcification seen on CAT scan 05/12/2015   Thoracic aortic aneurysm (HCC) 05/12/2015   Dyspnea 03/17/2015   Left leg swelling 03/16/2015  Acute colitis 10/04/2014   COPD (chronic obstructive pulmonary disease) (HCC)    PVD (peripheral vascular disease) (HCC) 04/01/2012   Leg pain 04/01/2012   Swelling of limb 04/01/2012   History of shingles    Hypertension    Fibroid    Thyroid disease    Anxiety     Past Medical History:  Diagnosis Date   Acute colitis    Anxiety    B12 deficiency    Bilateral swelling of feet    Constipation    COPD (chronic obstructive pulmonary  disease) (HCC)    Depression    DVT (deep venous thrombosis) (HCC) 2014, 2017   Fibroid    Gallbladder problem    Heart valve problem    High cholesterol    History of shingles    Hypertension    IBS (irritable bowel syndrome)    Joint pain    PE (pulmonary thromboembolism) (HCC) 2017   SOB (shortness of breath)    Thyroid disease    Nodules   Varicose veins    Vitamin D deficiency     Family History  Problem Relation Age of Onset   Crohn's disease Mother    Alzheimer's disease Mother    Hypertension Father    Stroke Father    Sudden death Father    Aneurysm Father    Cancer Sister    Diabetes Sister    Hypertension Sister    Prostate cancer Brother    Stroke Brother    Drug abuse Son    Other Son        overdose in 2019   Colon cancer Son    Past Surgical History:  Procedure Laterality Date   ABDOMINAL HYSTERECTOMY     CHOLECYSTECTOMY N/A 11/05/2017   Procedure: LAPAROSCOPIC CHOLECYSTECTOMY WITH INTRAOPERATIVE CHOLANGIOGRAM;  Surgeon: Griselda Miner, MD;  Location: [facility] OR;  Service: General;  Laterality: N/A;   COLONOSCOPY N/A 10/05/2014   Procedure: COLONOSCOPY;  Surgeon: Bernette Redbird, MD;  Location: [facility] ENDOSCOPY;  Service: Endoscopy;  Laterality: N/A;   ERCP N/A 11/04/2017   Procedure: ENDOSCOPIC RETROGRADE CHOLANGIOPANCREATOGRAPHY (ERCP) Balloon Extraction;  Surgeon: Kerin Salen, MD;  Location: [facility] ENDOSCOPY;  Service: Gastroenterology;  Laterality: N/A;   LAPAROSCOPIC CHOLECYSTECTOMY W/ CHOLANGIOGRAPHY  11/05/2017   Dr. Carolynne Edouard   OOPHORECTOMY     One ovary removed   SPHINCTEROTOMY  11/04/2017   Procedure: SPHINCTEROTOMY;  Surgeon: Kerin Salen, MD;  Location: [facility] ENDOSCOPY;  Service: Gastroenterology;;   TOTAL THYROIDECTOMY     Social History   Social History Narrative   Not on file   Immunization History  Administered Date(s) Administered   Influenza Split 02/21/2015   PFIZER(Purple Top)SARS-COV-2 Vaccination 02/23/2020     Objective: Vital Signs: BP 126/86  (BP Location: Right Arm, Patient Position: Sitting, Cuff Size: Large)   Pulse 67   Resp 15   Ht 5' 5.75" (1.67 m)   Wt 212 lb 6.4 oz (96.3 kg)   BMI 34.54 kg/m    Physical Exam Vitals and nursing note reviewed.  Constitutional:      Appearance: She is well-developed.  HENT:     Head: Normocephalic and atraumatic.  Eyes:     Conjunctiva/sclera: Conjunctivae normal.  Cardiovascular:     Rate and Rhythm: Normal rate and regular rhythm.     Heart sounds: Normal heart sounds.  Pulmonary:     Effort: Pulmonary effort is normal.     Breath sounds: Normal breath sounds.  Abdominal:     General:  Bowel sounds are normal.     Palpations: Abdomen is soft.  Musculoskeletal:     Cervical back: Normal range of motion.  Lymphadenopathy:     Cervical: No cervical adenopathy.  Skin:    General: Skin is warm and dry.     Capillary Refill: Capillary refill takes less than 2 seconds.  Neurological:     Mental Status: She is alert and oriented to person, place, and time.  Psychiatric:        Behavior: Behavior normal.      Musculoskeletal Exam: Patient had good range of motion of the cervical spine.  Thoracic and lumbar spine was difficult to assess due to body habitus.  Shoulder joints, elbow joints, wrist joints, MCPs PIPs and DIPs been good range of motion.  PIP and DIP thickening with no synovitis was noted.  Hip joints were in good range of motion.  She had discomfort with range of motion of the right hip joint.  She had painful range of motion of bilateral knee joints without any warmth swelling or effusion.  There was no tenderness over ankles or MTPs.  Postsurgical changes were noted over MTPs.  No synovitis was noted.  CDAI Exam: CDAI Score: -- Patient Global: --; Provider Global: -- Swollen: --; Tender: -- Joint Exam 01/23/2023   No joint exam has been documented for this visit   There is currently no information documented on the homunculus. Go to the Rheumatology activity and  complete the homunculus joint exam.  Investigation: No additional findings.  Imaging: No results found.  Recent Labs: Lab Results  Component Value Date   WBC 6.3 08/24/2021   HGB 12.5 08/24/2021   PLT 217 08/24/2021   NA 137 08/24/2021   K 4.0 08/24/2021   CL 101 08/24/2021   CO2 27 08/24/2021   GLUCOSE 103 (H) 08/24/2021   BUN 23 08/24/2021   CREATININE 1.04 (H) 08/24/2021   BILITOT 0.5 11/02/2020   ALKPHOS 48 11/02/2020   AST 17 11/02/2020   ALT 19 11/02/2020   PROT 7.0 11/02/2020   ALBUMIN 4.3 11/02/2020   CALCIUM 9.0 08/24/2021   GFRAA 69 03/09/2019    Speciality Comments: No specialty comments available.  Procedures:  No procedures performed Allergies: Penicillins, Sulfa antibiotics, Ace inhibitors, Atorvastatin, Citrus, Oxycodone, Sulfamethoxazole-trimethoprim, and Vicodin [hydrocodone-acetaminophen]   Assessment / Plan:     Visit Diagnoses: Positive ANA (antinuclear antibody) - 07/27/22: ESR 17, ANA 1:80 cytoplasmic, 1:640 nuclear large coarse speckled, Ro>8, La-, RF 16, CRP<3, TB gold negative, TSH 2.88,BNP 45,vitamin B12 656,folate>24 -patient has positive ANA and positive Ro antibody.  She complains of dry eyes but denies any history of dry mouth.  There is no history of oral ulcers, nasal ulcers, malar rash, photosensitivity, Raynaud's, lymphadenopathy or inflammatory arthritis.  I will obtain labs again today.  Plan: Urinalysis, Routine w reflex microscopic, Sedimentation rate, ANA, Anti-scleroderma antibody, RNP Antibody, Anti-Smith antibody, Sjogrens syndrome-A extractable nuclear antibody, Sjogrens syndrome-B extractable nuclear antibody, Anti-DNA antibody, double-stranded, C3 and C4, Beta-2 glycoprotein antibodies, Cardiolipin antibodies, IgG, IgM, IgA, Lupus Anticoagulant Eval w/Reflex  Pain in both hands -patient complains of pain and discomfort in her bilateral hands.  No synovitis was noted.  Bilateral PIP and DIP thickening was noted.  She is left-handed.   Plan: XR Hand 2 View Right, XR Hand 2 View Left, x-rays of bilateral hands were suggestive of osteoarthritis.  Rheumatoid factor, Cyclic citrul peptide antibody, IgG  Chronic pain of both knees -she complains of discomfort in the bilateral  knee joints for over 20 years.  Patient states she had to quit working due to knee joint pain.  No warmth swelling or effusion was noted.  Plan: XR KNEE 3 VIEW RIGHT, XR KNEE 3 VIEW LEFT.  X-rays of the bilateral knee joints are suggestive of moderate osteoarthritis.  Pain in right hip -her hip joints and full range of motion.  She had discomfort in her right hip with range of motion.  Plan: XR HIP UNILAT W OR W/O PELVIS 2-3 VIEWS RIGHT.  X-ray showed bilateral osteoarthritis  Pain in both feet -she complains of discomfort in the bilateral feet.  She had postsurgical changes.  No synovitis was noted.  Plan: XR Foot 2 Views Right, XR Foot 2 Views Left  Other fatigue -she gives history of chronic fatigue.  Plan: CBC with Differential/Platelet, COMPLETE METABOLIC PANEL WITH GFR, CK, Glucose 6 phosphate dehydrogenase  Stage 3a chronic kidney disease (HCC)-I do not have any recent labs available.  Creatinine was 1.04 in June 2023.  Calcified granuloma of lung - CT angio chest 01/25/22: L3 mm superior segment left lower lobe nodule, unchanged, benign, calcified granulomas  History of COPD - Centrilobular emphysema  Former smoker - Quit smoking May 12, 2008.  Smoked 1 to 2 pack/day for 50 years.  Coronary artery calcification seen on CAT scan  Pulmonary hypertension (HCC)  Acute deep vein thrombosis (DVT) of femoral vein of left lower extremity (HCC) - 1610,9604  Pulmonary embolism without acute cor pulmonale, unspecified chronicity, unspecified pulmonary embolism type (HCC)-patient is on Xarelto.Plan: Beta-2 glycoprotein antibodies, Cardiolipin antibodies, IgG, IgM, IgA, Lupus Anticoagulant Eval w/Reflex  PVD (peripheral vascular disease) (HCC) -she is  followed by vascular surgery.  Thoracic aortic aneurysm without rupture, unspecified part (HCC)  Other medical problems are listed as follows:  Primary hypertension-blood pressure was normal today.  Choledocholithiasis with acute cholecystitis with obstruction  Thyroid disease  History of anxiety  History of depression  Family history of systemic lupus erythematosus-grandson and niece    Orders: Orders Placed This Encounter  Procedures   XR Hand 2 View Right   XR Hand 2 View Left   XR KNEE 3 VIEW RIGHT   XR KNEE 3 VIEW LEFT   XR HIP UNILAT W OR W/O PELVIS 2-3 VIEWS RIGHT   XR Foot 2 Views Right   XR Foot 2 Views Left   CBC with Differential/Platelet   COMPLETE METABOLIC PANEL WITH GFR   Urinalysis, Routine w reflex microscopic   CK   Sedimentation rate   Rheumatoid factor   Cyclic citrul peptide antibody, IgG   ANA   Anti-scleroderma antibody   RNP Antibody   Anti-Smith antibody   Sjogrens syndrome-A extractable nuclear antibody   Sjogrens syndrome-B extractable nuclear antibody   Anti-DNA antibody, double-stranded   C3 and C4   Beta-2 glycoprotein antibodies   Cardiolipin antibodies, IgG, IgM, IgA   Lupus Anticoagulant Eval w/Reflex   Glucose 6 phosphate dehydrogenase   No orders of the defined types were placed in this encounter.    Follow-Up Instructions: Return for +ANA,+SSA, Joint pain.   Pollyann Savoy, MD  Note - This record has been created using Animal nutritionist.  Chart creation errors have been sought, but may not always  have been located. Such creation errors do not reflect on  the standard of medical care.

## 2023-01-11 ENCOUNTER — Encounter: Payer: Self-pay | Admitting: Neurology

## 2023-01-23 ENCOUNTER — Ambulatory Visit: Payer: Medicare HMO

## 2023-01-23 ENCOUNTER — Ambulatory Visit: Payer: Medicare HMO | Attending: Rheumatology | Admitting: Rheumatology

## 2023-01-23 ENCOUNTER — Encounter: Payer: Self-pay | Admitting: Rheumatology

## 2023-01-23 VITALS — BP 126/86 | HR 67 | Resp 15 | Ht 65.75 in | Wt 212.4 lb

## 2023-01-23 DIAGNOSIS — M79642 Pain in left hand: Secondary | ICD-10-CM

## 2023-01-23 DIAGNOSIS — M25562 Pain in left knee: Secondary | ICD-10-CM

## 2023-01-23 DIAGNOSIS — Z8659 Personal history of other mental and behavioral disorders: Secondary | ICD-10-CM

## 2023-01-23 DIAGNOSIS — M79672 Pain in left foot: Secondary | ICD-10-CM

## 2023-01-23 DIAGNOSIS — I712 Thoracic aortic aneurysm, without rupture, unspecified: Secondary | ICD-10-CM

## 2023-01-23 DIAGNOSIS — J984 Other disorders of lung: Secondary | ICD-10-CM | POA: Diagnosis not present

## 2023-01-23 DIAGNOSIS — I2510 Atherosclerotic heart disease of native coronary artery without angina pectoris: Secondary | ICD-10-CM

## 2023-01-23 DIAGNOSIS — I82412 Acute embolism and thrombosis of left femoral vein: Secondary | ICD-10-CM

## 2023-01-23 DIAGNOSIS — M79641 Pain in right hand: Secondary | ICD-10-CM

## 2023-01-23 DIAGNOSIS — M25551 Pain in right hip: Secondary | ICD-10-CM | POA: Diagnosis not present

## 2023-01-23 DIAGNOSIS — M79671 Pain in right foot: Secondary | ICD-10-CM | POA: Diagnosis not present

## 2023-01-23 DIAGNOSIS — Z8709 Personal history of other diseases of the respiratory system: Secondary | ICD-10-CM

## 2023-01-23 DIAGNOSIS — G8929 Other chronic pain: Secondary | ICD-10-CM

## 2023-01-23 DIAGNOSIS — Z8269 Family history of other diseases of the musculoskeletal system and connective tissue: Secondary | ICD-10-CM

## 2023-01-23 DIAGNOSIS — R768 Other specified abnormal immunological findings in serum: Secondary | ICD-10-CM | POA: Diagnosis not present

## 2023-01-23 DIAGNOSIS — M25561 Pain in right knee: Secondary | ICD-10-CM | POA: Diagnosis not present

## 2023-01-23 DIAGNOSIS — N1831 Chronic kidney disease, stage 3a: Secondary | ICD-10-CM | POA: Diagnosis not present

## 2023-01-23 DIAGNOSIS — I739 Peripheral vascular disease, unspecified: Secondary | ICD-10-CM

## 2023-01-23 DIAGNOSIS — I10 Essential (primary) hypertension: Secondary | ICD-10-CM

## 2023-01-23 DIAGNOSIS — I272 Pulmonary hypertension, unspecified: Secondary | ICD-10-CM

## 2023-01-23 DIAGNOSIS — Z8619 Personal history of other infectious and parasitic diseases: Secondary | ICD-10-CM

## 2023-01-23 DIAGNOSIS — Z87891 Personal history of nicotine dependence: Secondary | ICD-10-CM

## 2023-01-23 DIAGNOSIS — E079 Disorder of thyroid, unspecified: Secondary | ICD-10-CM

## 2023-01-23 DIAGNOSIS — I2699 Other pulmonary embolism without acute cor pulmonale: Secondary | ICD-10-CM

## 2023-01-23 DIAGNOSIS — R5383 Other fatigue: Secondary | ICD-10-CM

## 2023-01-23 DIAGNOSIS — K8043 Calculus of bile duct with acute cholecystitis with obstruction: Secondary | ICD-10-CM

## 2023-01-24 NOTE — Progress Notes (Signed)
UA is consistent with urinary tract infection.  Please notify patient.  She should see her PCP for the treatment of urinary tract infection.  Please forward results to her PCP.

## 2023-01-25 NOTE — Progress Notes (Signed)
Discussed results at the follow-up visit.  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

## 2023-01-27 NOTE — Progress Notes (Signed)
Sedimentation rate is mildly elevated.  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  I plan to discuss treatment plan and results at the follow-up visit.

## 2023-01-28 ENCOUNTER — Telehealth: Payer: Self-pay

## 2023-01-28 LAB — URINALYSIS, ROUTINE W REFLEX MICROSCOPIC
Bilirubin Urine: NEGATIVE
Glucose, UA: NEGATIVE
Hgb urine dipstick: NEGATIVE
Hyaline Cast: NONE SEEN /LPF
Ketones, ur: NEGATIVE
Nitrite: POSITIVE — AB
Protein, ur: NEGATIVE
RBC / HPF: NONE SEEN /[HPF] (ref 0–2)
Specific Gravity, Urine: 1.017 (ref 1.001–1.035)
WBC, UA: >=60 /HPF — AB (ref 0–5)
pH: 5.5 (ref 5.0–8.0)

## 2023-01-28 LAB — CBC WITH DIFFERENTIAL/PLATELET
Absolute Lymphocytes: 1815 {cells}/uL (ref 850–3900)
Absolute Monocytes: 361 {cells}/uL (ref 200–950)
Basophils Absolute: 39 {cells}/uL (ref 0–200)
Basophils Relative: 0.9 %
Eosinophils Absolute: 172 {cells}/uL (ref 15–500)
Eosinophils Relative: 4 %
HCT: 37.9 % (ref 35.0–45.0)
Hemoglobin: 12.3 g/dL (ref 11.7–15.5)
MCH: 30.9 pg (ref 27.0–33.0)
MCHC: 32.5 g/dL (ref 32.0–36.0)
MCV: 95.2 fL (ref 80.0–100.0)
MPV: 10.4 fL (ref 7.5–12.5)
Monocytes Relative: 8.4 %
Neutro Abs: 1914 {cells}/uL (ref 1500–7800)
Neutrophils Relative %: 44.5 %
Platelets: 274 10*3/uL (ref 140–400)
RBC: 3.98 10*6/uL (ref 3.80–5.10)
RDW: 14.4 % (ref 11.0–15.0)
Total Lymphocyte: 42.2 %
WBC: 4.3 10*3/uL (ref 3.8–10.8)

## 2023-01-28 LAB — GLUCOSE 6 PHOSPHATE DEHYDROGENASE: G-6PDH: 15.1 U/g{Hb} (ref 7.0–20.5)

## 2023-01-28 LAB — C3 AND C4
C3 Complement: 152 mg/dL
C4 Complement: 15 mg/dL

## 2023-01-28 LAB — CARDIOLIPIN ANTIBODIES, IGG, IGM, IGA
Anticardiolipin IgA: 11 [APL'U]/mL (ref <20.0)
Anticardiolipin IgG: <2 [GPL'U]/mL (ref <20.0)
Anticardiolipin IgM: <2 [MPL'U]/mL (ref <20.0)

## 2023-01-28 LAB — RFLX HEXAGONAL PHASE CONFIRM: Hexagonal Phase Conf: NEGATIVE

## 2023-01-28 LAB — COMPLETE METABOLIC PANEL WITH GFR
AG Ratio: 1.3 (calc) (ref 1.0–2.5)
ALT: 15 U/L (ref 6–29)
AST: 19 U/L (ref 10–35)
Albumin: 4 g/dL (ref 3.6–5.1)
Alkaline phosphatase (APISO): 42 U/L (ref 37–153)
BUN/Creatinine Ratio: 27 (calc) — ABNORMAL HIGH (ref 6–22)
BUN: 26 mg/dL — ABNORMAL HIGH (ref 7–25)
CO2: 30 mmol/L (ref 20–32)
Calcium: 9.6 mg/dL (ref 8.6–10.4)
Chloride: 104 mmol/L (ref 98–110)
Creat: 0.98 mg/dL — ABNORMAL HIGH (ref 0.60–0.95)
Globulin: 3.2 g/dL (ref 1.9–3.7)
Glucose, Bld: 92 mg/dL (ref 65–99)
Potassium: 4.4 mmol/L (ref 3.5–5.3)
Sodium: 140 mmol/L (ref 135–146)
Total Bilirubin: 0.5 mg/dL (ref 0.2–1.2)
Total Protein: 7.2 g/dL (ref 6.1–8.1)
eGFR: 57 mL/min/{1.73_m2} — ABNORMAL LOW (ref >=60)

## 2023-01-28 LAB — RFLX DRVVT CONFRIM: DRVVT CONFIRM: NEGATIVE

## 2023-01-28 LAB — LUPUS ANTICOAGULANT EVAL W/ REFLEX
PTT-LA Screen: 44 s — ABNORMAL HIGH (ref <=40)
dRVVT: 66 s — ABNORMAL HIGH (ref <=45)

## 2023-01-28 LAB — MICROSCOPIC MESSAGE

## 2023-01-28 LAB — SEDIMENTATION RATE: Sed Rate: 36 mm/h — ABNORMAL HIGH (ref 0–30)

## 2023-01-28 LAB — ANTI-NUCLEAR AB-TITER (ANA TITER)
ANA TITER: 1:320 {titer} — ABNORMAL HIGH
ANA Titer 1: 1:1280 {titer} — ABNORMAL HIGH

## 2023-01-28 LAB — BETA-2 GLYCOPROTEIN ANTIBODIES
Beta-2 Glyco 1 IgA: 8.8 U/mL (ref <20.0)
Beta-2 Glyco 1 IgM: <2 U/mL (ref <20.0)
Beta-2 Glyco I IgG: <2 U/mL (ref <20.0)

## 2023-01-28 LAB — ANTI-SCLERODERMA ANTIBODY: Scleroderma (Scl-70) (ENA) Antibody, IgG: <1 AI

## 2023-01-28 LAB — RHEUMATOID FACTOR: Rheumatoid fact SerPl-aCnc: 15 [IU]/mL — ABNORMAL HIGH (ref <14)

## 2023-01-28 LAB — SJOGRENS SYNDROME-B EXTRACTABLE NUCLEAR ANTIBODY: SSB (La) (ENA) Antibody, IgG: <1 AI

## 2023-01-28 LAB — CK: Total CK: 173 U/L — ABNORMAL HIGH (ref 29–143)

## 2023-01-28 LAB — ANA: Anti Nuclear Antibody (ANA): POSITIVE — AB

## 2023-01-28 LAB — SJOGRENS SYNDROME-A EXTRACTABLE NUCLEAR ANTIBODY: SSA (Ro) (ENA) Antibody, IgG: >8 AI — AB

## 2023-01-28 LAB — ANTI-SMITH ANTIBODY: ENA SM Ab Ser-aCnc: <1 AI

## 2023-01-28 LAB — ANTI-DNA ANTIBODY, DOUBLE-STRANDED: ds DNA Ab: 5 [IU]/mL — ABNORMAL HIGH

## 2023-01-28 LAB — CYCLIC CITRUL PEPTIDE ANTIBODY, IGG: Cyclic Citrullin Peptide Ab: <16 U

## 2023-01-28 LAB — RNP ANTIBODY: Ribonucleic Protein(ENA) Antibody, IgG: >8 AI — AB

## 2023-01-28 NOTE — Telephone Encounter (Signed)
Patient is currently at her PCPs office for an antibiotic for the UTI and requested that we fax the labs. I have faxed the CBC, CMP and UA results.   Patient called again and the nurse provided Denise Cline with another faxed number of [phone]. Labs have been faxed again.

## 2023-01-30 ENCOUNTER — Inpatient Hospital Stay
Admission: RE | Admit: 2023-01-30 | Discharge: 2023-01-30 | Disposition: A | Discharge status: Home / Self Care | Payer: Medicare HMO | Source: Ambulatory Visit | Attending: Surgery | Admitting: Surgery

## 2023-01-30 DIAGNOSIS — I712 Thoracic aortic aneurysm, without rupture, unspecified: Secondary | ICD-10-CM

## 2023-01-30 MED ORDER — IOPAMIDOL (ISOVUE-370) INJECTION 76%
100.0000 mL | Freq: Once | INTRAVENOUS | Status: AC | PRN
  Administered 2023-01-30: 75 mL via INTRAVENOUS

## 2023-02-05 NOTE — Progress Notes (Signed)
Office Visit Note  Patient: Denise Cline             Date of Birth: [birth year]           MRN: [id number]             PCP: [name], NP Referring: No ref. provider found Visit Date: 02/19/2023 Occupation: @GUAROCC @  Subjective:  Pain in multiple joints  History of Present Illness: Denise Cline is a 82 y.o. female with mixed connective tissue disease and osteoarthritis.  She returns for Ro follow-up visit after her initial evaluation on January 23, 2023.  Patient states she continues to have pain and discomfort in her both hands, both knees and her feet.  She has not noticed any joint swelling.  Patient states she continues to take Tylenol for joint pain and discomfort.  She gives history of dry eyes.  She denies any history of oral ulcers, nasal ulcers, malar rash, photosensitivity, Raynaud's, lymphadenopathy or inflammatory arthritis.    Activities of Daily Living:  Patient reports morning stiffness for 30   minutes.   Patient Reports nocturnal pain.  Difficulty dressing/grooming: Denies Difficulty climbing stairs: Reports Difficulty getting out of chair: Reports Difficulty using hands for taps, buttons, cutlery, and/or writing: Reports  Review of Systems  Constitutional:  Positive for fatigue.  HENT:  Negative for mouth sores and mouth dryness.   Eyes:  Positive for dryness.  Respiratory:  Positive for shortness of breath.   Cardiovascular:  Negative for chest pain and palpitations.  Gastrointestinal:  Positive for constipation and diarrhea. Negative for blood in stool.  Endocrine: Negative for increased urination.  Genitourinary:  Positive for involuntary urination.  Musculoskeletal:  Positive for joint pain, gait problem, joint pain, joint swelling, myalgias, muscle weakness, morning stiffness, muscle tenderness and myalgias.  Skin:  Positive for hair loss. Negative for color change, rash and sensitivity to sunlight.  Allergic/Immunologic: Negative for  susceptible to infections.  Neurological:  Negative for dizziness and headaches.  Hematological:  Negative for swollen glands.  Psychiatric/Behavioral:  Positive for sleep disturbance. Negative for depressed mood. The patient is nervous/anxious.     PMFS History:  Patient Active Problem List   Diagnosis Date Noted   Choledocholithiasis with acute cholecystitis with obstruction 11/02/2017   Chronic kidney disease, stage 3 (HCC) 11/02/2017   DVT (deep venous thrombosis) (HCC) 11/12/2015   Pulmonary emboli (HCC) 11/11/2015   Acute pulmonary embolism (HCC) 11/11/2015   Coronary artery calcification seen on CAT scan 05/12/2015   Thoracic aortic aneurysm (HCC) 05/12/2015   Dyspnea 03/17/2015   Left leg swelling 03/16/2015   Acute colitis 10/04/2014   COPD (chronic obstructive pulmonary disease) (HCC)    PVD (peripheral vascular disease) (HCC) 04/01/2012   Leg pain 04/01/2012   Swelling of limb 04/01/2012   History of shingles    Hypertension    Fibroid    Thyroid disease    Anxiety     Past Medical History:  Diagnosis Date   Acute colitis    Anxiety    B12 deficiency    Bilateral swelling of feet    Constipation    COPD (chronic obstructive pulmonary disease) (HCC)    Depression    DVT (deep venous thrombosis) (HCC) 2014, 2017   Fibroid    Gallbladder problem    Heart valve problem    High cholesterol    History of shingles    Hypertension    IBS (irritable bowel syndrome)    Joint pain  PE (pulmonary thromboembolism) (HCC) 2017   SOB (shortness of breath)    Thyroid disease    Nodules   Varicose veins    Vitamin D deficiency     Family History  Problem Relation Age of Onset   Crohn's disease Mother    Alzheimer's disease Mother    Hypertension Father    Stroke Father    Sudden death Father    Aneurysm Father    Cancer Sister    Diabetes Sister    Hypertension Sister    Prostate cancer Brother    Stroke Brother    Drug abuse Son    Other Son         overdose in 2019   Colon cancer Son    Past Surgical History:  Procedure Laterality Date   ABDOMINAL HYSTERECTOMY     CHOLECYSTECTOMY N/A 11/05/2017   Procedure: LAPAROSCOPIC CHOLECYSTECTOMY WITH INTRAOPERATIVE CHOLANGIOGRAM;  Surgeon: Griselda Miner, MD;  Location: [facility] OR;  Service: General;  Laterality: N/A;   COLONOSCOPY N/A 10/05/2014   Procedure: COLONOSCOPY;  Surgeon: Bernette Redbird, MD;  Location: [facility] ENDOSCOPY;  Service: Endoscopy;  Laterality: N/A;   ERCP N/A 11/04/2017   Procedure: ENDOSCOPIC RETROGRADE CHOLANGIOPANCREATOGRAPHY (ERCP) Balloon Extraction;  Surgeon: Kerin Salen, MD;  Location: [facility] ENDOSCOPY;  Service: Gastroenterology;  Laterality: N/A;   LAPAROSCOPIC CHOLECYSTECTOMY W/ CHOLANGIOGRAPHY  11/05/2017   Dr. Carolynne Edouard   OOPHORECTOMY     One ovary removed   SPHINCTEROTOMY  11/04/2017   Procedure: SPHINCTEROTOMY;  Surgeon: Kerin Salen, MD;  Location: [facility] ENDOSCOPY;  Service: Gastroenterology;;   TOTAL THYROIDECTOMY     Social History   Social History Narrative   Not on file   Immunization History  Administered Date(s) Administered   Influenza Split 02/21/2015   PFIZER(Purple Top)SARS-COV-2 Vaccination 02/23/2020     Objective: Vital Signs: BP 115/80 (BP Location: Left Arm, Patient Position: Sitting, Cuff Size: Normal)   Pulse 72   Resp 14   Ht 5\' 5"  (1.651 m)   Wt 208 lb (94.3 kg)   BMI 34.61 kg/m    Physical Exam Vitals and nursing note reviewed.  Constitutional:      Appearance: She is well-developed.  HENT:     Head: Normocephalic and atraumatic.  Eyes:     Conjunctiva/sclera: Conjunctivae normal.  Cardiovascular:     Rate and Rhythm: Normal rate and regular rhythm.     Heart sounds: Normal heart sounds.  Pulmonary:     Effort: Pulmonary effort is normal.     Breath sounds: Normal breath sounds.  Abdominal:     General: Bowel sounds are normal.     Palpations: Abdomen is soft.  Musculoskeletal:     Cervical back: Normal range of motion.  Lymphadenopathy:      Cervical: No cervical adenopathy.  Skin:    General: Skin is warm and dry.     Capillary Refill: Capillary refill takes less than 2 seconds.  Neurological:     Mental Status: She is alert and oriented to person, place, and time.  Psychiatric:        Behavior: Behavior normal.      Musculoskeletal Exam: She had good range of motion of the cervical spine.  Shoulders, elbows, wrist joints were in good range of motion.  She had bilateral CMC PIP and DIP thickening with no synovitis.  Hip joints were in good range of motion.  She had good range of motion of bilateral knee joints without any warmth swelling or effusion.  She  had discomfort with range of motion of bilateral knee joints.  There was no tenderness over ankles or MTPs.  CDAI Exam: CDAI Score: -- Patient Global: --; Provider Global: -- Swollen: --; Tender: -- Joint Exam 02/19/2023   No joint exam has been documented for this visit   There is currently no information documented on the homunculus. Go to the Rheumatology activity and complete the homunculus joint exam.  Investigation: No additional findings.  Imaging: CT ANGIO CHEST AORTA W/CM & OR WO/CM Result Date: 01/30/2023 CLINICAL DATA:  Thoracic aortic aneurysm follow-up. EXAM: CT ANGIOGRAPHY CHEST WITH CONTRAST TECHNIQUE: Multidetector CT imaging of the chest was performed using the standard protocol during bolus administration of intravenous contrast. Multiplanar CT image reconstructions and MIPs were obtained to evaluate the vascular anatomy. RADIATION DOSE REDUCTION: This exam was performed according to the departmental dose-optimization program which includes automated exposure control, adjustment of the mA and/or kV according to patient size and/or use of iterative reconstruction technique. CONTRAST:  75mL ISOVUE-370 IOPAMIDOL (ISOVUE-370) INJECTION 76% COMPARISON:  CT chest dated January 31, 2022. FINDINGS: Cardiovascular: Unchanged 4.4 cm ascending thoracic aortic  aneurysm. No dissection. Mild coronary, aortic arch, and branch vessel atherosclerotic vascular disease. Unchanged mild cardiomegaly. No pericardial effusion. No central pulmonary embolism. Mediastinum/Nodes: Prominent right axillary and subpectoral lymph nodes measuring up to 1.3 cm in short axis are not significantly changed and remain likely reactive. No enlarged mediastinal, hilar, or left axillary lymph nodes. Previously biopsy 3.1 cm nodule in the right thyroid lobe is unchanged. Prior left thyroid lobe resection. Trachea and esophagus demonstrate no significant findings. Lungs/Pleura: No suspicious pulmonary nodule. Scattered thin-walled air cysts in both lungs are unchanged. Evidence of prior granulomatous disease again noted. No focal consolidation, pleural effusion, or pneumothorax. Upper Abdomen: No acute abnormality. Unchanged small hiatal hernia. Prior cholecystectomy. Musculoskeletal: No chest wall abnormality. No acute or significant osseous findings. Review of the MIP images confirms the above findings. IMPRESSION: 1. Unchanged 4.4 cm ascending thoracic aortic aneurysm. 2.  Aortic Atherosclerosis (ICD10-I70.0). Electronically Signed   By: Obie Dredge M.D.   On: 01/30/2023 12:05   XR Foot 2 Views Left Result Date: 01/23/2023 Pain was noted in the first metatarsal.  First MTP, PIP and DIP narrowing was noted.  No intertarsal, tibiotalar or subtalar joint space narrowing was noted.  Inferior calcaneal spur was noted.  No erosive changes were noted. Impression: These findings are suggestive of osteoarthritis of the foot.  XR Foot 2 Views Right Result Date: 01/23/2023 First MTP, PIP and DIP narrowing was noted.  Postsurgical changes were noted in the fifth proximal phalanx.  No intertarsal, tibiotalar or subtalar joint space narrowing was noted.  Inferior calcaneal spur was noted. Impression: These findings are suggestive of osteoarthritis of the foot.  XR HIP UNILAT W OR W/O PELVIS 2-3  VIEWS RIGHT Result Date: 01/23/2023 Moderate right hip joint narrowing was noted.  Mild left hip joint narrowing was noted.  No SI joint narrowing or sclerosis was noted.  Degenerative changes were noted in the lumbar spine. Impression: These findings are suggestive of bilateral hip osteoarthritis.  XR KNEE 3 VIEW LEFT Result Date: 01/23/2023 Moderate medial compartment narrowing with medial osteophytes was noted.  No patellofemoral narrowing was noted. Impression: These findings are suggestive of moderate osteoarthritis of the knee.  XR KNEE 3 VIEW RIGHT Result Date: 01/23/2023 Moderate lateral compartment narrowing was noted.  No patellofemoral narrowing was noted.  Possible chondrocalcinosis was noted. Impression: These findings are suggestive of moderate osteoarthritis of  the knee joint.  XR Hand 2 View Left Result Date: 01/23/2023 CMC, PIP and DIP narrowing was noted.  No MCP, intercarpal and radiocarpal joint space narrowing was noted.  No erosive changes were noted. Impression: These findings are suggestive of osteoarthritis of the hand.  XR Hand 2 View Right Result Date: 01/23/2023 CMC, PIP and DIP narrowing was noted.  No MCP, intercarpal and radiocarpal joint space narrowing was noted.  No erosive changes were noted. Impression: These findings are suggestive of osteoarthritis of the hand.   Recent Labs: Lab Results  Component Value Date   WBC 4.3 01/23/2023   HGB 12.3 01/23/2023   PLT 274 01/23/2023   NA 140 01/23/2023   K 4.4 01/23/2023   CL 104 01/23/2023   CO2 30 01/23/2023   GLUCOSE 92 01/23/2023   BUN 26 (H) 01/23/2023   CREATININE 0.98 (H) 01/23/2023   BILITOT 0.5 01/23/2023   ALKPHOS 48 11/02/2020   AST 19 01/23/2023   ALT 15 01/23/2023   PROT 7.2 01/23/2023   ALBUMIN 4.3 11/02/2020   CALCIUM 9.6 01/23/2023   GFRAA 69 03/09/2019   January 23, 2023 UA showed positive WBCs and bacteria, ANA 1: 1280 NS, 1: 320 cytoplasmic, RNP positive, SSA positive, SSB  negative, Smith negative, dsDNA 5 (indeterminate), C3-C4 normal, beta-2 GP 1 negative, anticardiolipin negative, lupus anticoagulant negative, G6PD normal, RF 15, anti-CCP negative, CK173, ESR 36  07/27/22: ESR 17, ANA 1:80 cytoplasmic, 1:640 nuclear large coarse speckled, Ro>8, La-, RF 16, CRP<3, TB gold negative, TSH 2.88,BNP 45,vitamin B12 656,folate>24    Speciality Comments: No specialty comments available.  Procedures:  No procedures performed Allergies: Penicillins, Sulfa antibiotics, Ace inhibitors, Atorvastatin, Citrus, Oxycodone, Sulfamethoxazole-trimethoprim, and Vicodin [hydrocodone-acetaminophen]   Assessment / Plan:     Visit Diagnoses: MCTD (mixed connective tissue disease) (HCC) - Positive ANA, positive RNP, positive SSA, positive dsDNA, positive RF, elevated sedimentation rate: I did detailed discussion with the patient regarding the lab results.  Complements are normal and sed rate is mildly elevated.  Signs dry mouth she does not have any other symptoms of autoimmune disease.  She complains of joint pain but no synovitis was noted.  She has osteoarthritis involving multiple joints.  I discussed possible use of hydroxychloroquine.  Side effects of hydroxychloroquine were discussed at length.  Patient states she is uncertain if she wants to take any medications for mixed connective tissue disease as she does not have much symptoms.  I provided her with a handout to review.  I also advised her to contact us if she develops any new symptoms.  Pain in both hands -she complains of discomfort in her bilateral hands.  No synovitis was noted.  Bilateral CMC PIP and DIP thickening was noted.  X-rays obtained at the last visit of bilateral hands were suggestive of osteoarthritis.  X-ray findings were reviewed with the patient.  A handout on hand exercises was provided.  Chronic pain of both knees -she complains of discomfort in the bilateral knee joints.  No warmth swelling or effusion was  noted.  X-rays obtained at the last visit showed bilateral knee joint moderate osteoarthritis.  Detail counseled regarding osteoarthritis was provided.  A handout on lower extremity exercises was given.  Pain in right hip -she complains of right hip joint.  X-rays obtained at the last visit showed osteoarthritic changes.  Results were reviewed with the patient.  Pain in both feet-she complains of discomfort in her feet.  No synovitis was noted.  X-rays were suggestive  of osteoarthritis.  Stage 3a chronic kidney disease (HCC)-creatinine stable.  History of COPD-she has centrilobular emphysema.  Calcified granuloma of lung - CT angio chest 01/25/22: L3 mm superior segment left lower lobe nodule, unchanged, benign, calcified granulomas  Coronary artery calcification seen on CAT scan  Former smoker - Quit smoking May 12, 2008.  Smoked 1 to 2 pack/day for 50 years.  Pulmonary hypertension (HCC)-followed by cardiology.  Association of Active show disease with pulmonary hypertension was discussed.  Patient will discuss this further with her cardiologist.  Acute deep vein thrombosis (DVT) of femoral vein of left lower extremity (HCC) - 1610,9604  Pulmonary embolism without acute cor pulmonale, unspecified chronicity, unspecified pulmonary embolism type (HCC) - Xarelto  PVD (peripheral vascular disease) (HCC)  Thoracic aortic aneurysm without rupture, unspecified part (HCC)  Primary hypertension-blood pressure was normal today.  Choledocholithiasis with acute cholecystitis with obstruction  Thyroid disease  Anxiety and depression  Family history of systemic lupus erythematosus-grandson and niece  Orders: No orders of the defined types were placed in this encounter.  No orders of the defined types were placed in this encounter.    Follow-Up Instructions: Return in about 4 months (around 06/20/2023) for MCTD, OA.   Pollyann Savoy, MD  Note - This record has been created using  Animal nutritionist.  Chart creation errors have been sought, but may not always  have been located. Such creation errors do not reflect on  the standard of medical care.

## 2023-02-06 ENCOUNTER — Ambulatory Visit: Payer: Medicare HMO | Admitting: Surgery

## 2023-02-06 ENCOUNTER — Encounter: Payer: Self-pay | Admitting: Surgery

## 2023-02-06 VITALS — BP 152/95 | HR 77 | Resp 18 | Ht 65.0 in | Wt 208.0 lb

## 2023-02-06 DIAGNOSIS — I712 Thoracic aortic aneurysm, without rupture, unspecified: Secondary | ICD-10-CM | POA: Diagnosis not present

## 2023-02-06 NOTE — Progress Notes (Signed)
HPI:  The patient is an 82 year old woman who returns for follow-up of a 4.4cm fusiform ascending aortic aneurysm.  It was 4.2 cm on CTA two years ago and then last year was 4.4 cm. Since I saw her last year she has continued to feel fairly well.   She denies any chest pain or shortness of breath.   Current Outpatient Medications  Medication Sig Dispense Refill   acetaminophen (TYLENOL) 650 MG CR tablet Take 1,300 mg by mouth 2 (two) times a day.     aspirin EC 81 MG tablet Take 81 mg by mouth daily. Swallow whole.     Biotin 5 MG TABS Take by mouth.     buPROPion (WELLBUTRIN SR) 150 MG 12 hr tablet Take 150 mg by mouth 2 (two) times daily.     calcium-vitamin D (OSCAL WITH D) 250-125 MG-UNIT tablet Take 2 tablets by mouth daily.     Colesevelam HCl 3.75 g PACK Take by mouth.     hydrochlorothiazide (HYDRODIURIL) 25 MG tablet Take 25 mg by mouth daily.      lidocaine (LIDODERM) 5 % Place 1 patch onto the skin daily. Remove & Discard patch within 12 hours or as directed by MD 10 patch 0   losartan (COZAAR) 100 MG tablet Take 100 mg by mouth daily.     Magnesium 250 MG TABS Take by mouth.     metoprolol succinate (TOPROL-XL) 25 MG 24 hr tablet TAKE 1/2 TABLET BY MOUTH EVERY EVENING 45 tablet 3   Multiple Vitamins-Minerals (CENTRUM SILVER ADULT 50+) TABS Take 1 tablet by mouth daily.     pantoprazole (PROTONIX) 40 MG tablet TAKE ONE TABLET BY MOUTH EVERY EVENING 90 tablet 3   polyethylene glycol (MIRALAX / GLYCOLAX) packet Take 17 g by mouth daily. 14 each 0   rivaroxaban (XARELTO) 20 MG TABS tablet Take 20 mg by mouth daily with supper.     rosuvastatin (CRESTOR) 20 MG tablet TAKE ONE TABLET BY MOUTH EVERY EVENING 90 tablet 3   saccharomyces boulardii (FLORASTOR) 250 MG capsule Take 250 mg by mouth 2 (two) times daily.     sertraline (ZOLOFT) 100 MG tablet Take 100 mg by mouth daily.     sertraline (ZOLOFT) 50 MG tablet      traMADol (ULTRAM) 50 MG tablet Take 1 tablet (50 mg total) by  mouth every 6 (six) hours as needed for moderate pain. 30 tablet 0   traZODone (DESYREL) 50 MG tablet Take 50 mg by mouth at bedtime.     triamcinolone (KENALOG) 0.025 % cream Apply 1 application topically daily as needed for itching.     Vitamin D, Ergocalciferol, (DRISDOL) 1.25 MG (50000 UNIT) CAPS capsule TAKE ONE CAPSULE BY MOUTH ONCE WEEKLY ON MONDAY 4 capsule 5   No current facility-administered medications for this visit.     Physical Exam: BP (!) 152/95 (BP Location: Left Arm, Patient Position: Sitting)   Pulse 77   Resp 18   Ht 5\' 5"  (1.651 m)   Wt 208 lb (94.3 kg)   SpO2 98% Comment: RA  BMI 34.61 kg/m  She looks well. Cardiac exam shows a regular rate and rhythm with normal heart sounds.  There is no murmur. Lungs are clear.  Diagnostic Tests:  Narrative & Impression  CLINICAL DATA:  Thoracic aortic aneurysm follow-up.   EXAM: CT ANGIOGRAPHY CHEST WITH CONTRAST   TECHNIQUE: Multidetector CT imaging of the chest was performed using the standard protocol during bolus administration of  intravenous contrast. Multiplanar CT image reconstructions and MIPs were obtained to evaluate the vascular anatomy.   RADIATION DOSE REDUCTION: This exam was performed according to the departmental dose-optimization program which includes automated exposure control, adjustment of the mA and/or kV according to patient size and/or use of iterative reconstruction technique.   CONTRAST:  75mL ISOVUE-370 IOPAMIDOL (ISOVUE-370) INJECTION 76%   COMPARISON:  CT chest dated January 31, 2022.   FINDINGS: Cardiovascular: Unchanged 4.4 cm ascending thoracic aortic aneurysm. No dissection. Mild coronary, aortic arch, and branch vessel atherosclerotic vascular disease. Unchanged mild cardiomegaly. No pericardial effusion. No central pulmonary embolism.   Mediastinum/Nodes: Prominent right axillary and subpectoral lymph nodes measuring up to 1.3 cm in short axis are not  significantly changed and remain likely reactive. No enlarged mediastinal, hilar, or left axillary lymph nodes. Previously biopsy 3.1 cm nodule in the right thyroid lobe is unchanged. Prior left thyroid lobe resection. Trachea and esophagus demonstrate no significant findings.   Lungs/Pleura: No suspicious pulmonary nodule. Scattered thin-walled air cysts in both lungs are unchanged. Evidence of prior granulomatous disease again noted. No focal consolidation, pleural effusion, or pneumothorax.   Upper Abdomen: No acute abnormality. Unchanged small hiatal hernia. Prior cholecystectomy.   Musculoskeletal: No chest wall abnormality. No acute or significant osseous findings.   Review of the MIP images confirms the above findings.   IMPRESSION: 1. Unchanged 4.4 cm ascending thoracic aortic aneurysm. 2.  Aortic Atherosclerosis (ICD10-I70.0).     Electronically Signed   By: Obie Dredge M.D.   On: 01/30/2023 12:05     This 82 year old woman has a stable 4.4 cm fusiform ascending aortic aneurysm. This is still well below the surgical threshold of 5.5 cm.  Previous echocardiogram has shown a trileaflet aortic valve.  I reviewed the CT images with her and answered her questions.  I stressed the importance of continued good blood pressure control in preventing further enlargement and acute aortic dissection.   Plan:   I will plan to see her back in 1 year with a CTA of the chest.   I spent 15 minutes performing this established patient evaluation and > 50% of this time was spent face to face counseling and coordinating the care of this patient's aortic aneurysm.     Alleen Borne, MD [facility] [phone]

## 2023-02-13 ENCOUNTER — Other Ambulatory Visit: Payer: Self-pay | Admitting: Student

## 2023-02-13 DIAGNOSIS — R1310 Dysphagia, unspecified: Secondary | ICD-10-CM

## 2023-02-19 ENCOUNTER — Encounter: Payer: Self-pay | Admitting: Rheumatology

## 2023-02-19 ENCOUNTER — Ambulatory Visit: Payer: Medicare HMO | Attending: Rheumatology | Admitting: Rheumatology

## 2023-02-19 VITALS — BP 115/80 | HR 72 | Resp 14 | Ht 65.0 in | Wt 208.0 lb

## 2023-02-19 DIAGNOSIS — M351 Other overlap syndromes: Secondary | ICD-10-CM | POA: Diagnosis not present

## 2023-02-19 DIAGNOSIS — Z8269 Family history of other diseases of the musculoskeletal system and connective tissue: Secondary | ICD-10-CM

## 2023-02-19 DIAGNOSIS — N1831 Chronic kidney disease, stage 3a: Secondary | ICD-10-CM

## 2023-02-19 DIAGNOSIS — M79641 Pain in right hand: Secondary | ICD-10-CM | POA: Diagnosis not present

## 2023-02-19 DIAGNOSIS — M25561 Pain in right knee: Secondary | ICD-10-CM | POA: Diagnosis not present

## 2023-02-19 DIAGNOSIS — M25562 Pain in left knee: Secondary | ICD-10-CM

## 2023-02-19 DIAGNOSIS — M25551 Pain in right hip: Secondary | ICD-10-CM

## 2023-02-19 DIAGNOSIS — G8929 Other chronic pain: Secondary | ICD-10-CM

## 2023-02-19 DIAGNOSIS — M79672 Pain in left foot: Secondary | ICD-10-CM

## 2023-02-19 DIAGNOSIS — I10 Essential (primary) hypertension: Secondary | ICD-10-CM

## 2023-02-19 DIAGNOSIS — I712 Thoracic aortic aneurysm, without rupture, unspecified: Secondary | ICD-10-CM

## 2023-02-19 DIAGNOSIS — Z8709 Personal history of other diseases of the respiratory system: Secondary | ICD-10-CM

## 2023-02-19 DIAGNOSIS — Z87891 Personal history of nicotine dependence: Secondary | ICD-10-CM

## 2023-02-19 DIAGNOSIS — F419 Anxiety disorder, unspecified: Secondary | ICD-10-CM

## 2023-02-19 DIAGNOSIS — I272 Pulmonary hypertension, unspecified: Secondary | ICD-10-CM

## 2023-02-19 DIAGNOSIS — M79671 Pain in right foot: Secondary | ICD-10-CM

## 2023-02-19 DIAGNOSIS — I2699 Other pulmonary embolism without acute cor pulmonale: Secondary | ICD-10-CM

## 2023-02-19 DIAGNOSIS — F32A Depression, unspecified: Secondary | ICD-10-CM

## 2023-02-19 DIAGNOSIS — M79642 Pain in left hand: Secondary | ICD-10-CM

## 2023-02-19 DIAGNOSIS — Z79899 Other long term (current) drug therapy: Secondary | ICD-10-CM

## 2023-02-19 DIAGNOSIS — I2510 Atherosclerotic heart disease of native coronary artery without angina pectoris: Secondary | ICD-10-CM

## 2023-02-19 DIAGNOSIS — I82412 Acute embolism and thrombosis of left femoral vein: Secondary | ICD-10-CM

## 2023-02-19 DIAGNOSIS — E079 Disorder of thyroid, unspecified: Secondary | ICD-10-CM

## 2023-02-19 DIAGNOSIS — J984 Other disorders of lung: Secondary | ICD-10-CM

## 2023-02-19 DIAGNOSIS — I739 Peripheral vascular disease, unspecified: Secondary | ICD-10-CM

## 2023-02-19 DIAGNOSIS — K8043 Calculus of bile duct with acute cholecystitis with obstruction: Secondary | ICD-10-CM

## 2023-02-19 NOTE — Patient Instructions (Addendum)
Hydroxychloroquine Tablets What is this medication? HYDROXYCHLOROQUINE (hye drox ee KLOR oh kwin) treats autoimmune conditions, such as rheumatoid arthritis and lupus. It works by slowing down an overactive immune system. It may also be used to prevent and treat malaria. It works by killing the parasite that causes malaria. It belongs to a group of medications called DMARDs. This medicine may be used for other purposes; ask your health care provider or pharmacist if you have questions. COMMON BRAND NAME(S): Plaquenil, Quineprox, SOVUNA What should I tell my care team before I take this medication? They need to know if you have any of these conditions: Diabetes Eye disease, vision problems Frequently drink alcohol G6PD deficiency Heart disease Irregular heartbeat or rhythm Kidney disease Liver disease Porphyria Psoriasis An unusual or allergic reaction to hydroxychloroquine, other medications, foods, dyes, or preservatives Pregnant or trying to get pregnant Breastfeeding How should I use this medication? Take this medication by mouth with water. Take it as directed on the prescription label. Do not cut, crush, or chew this medication. Swallow the tablets whole. Take it with food. Do not take it more than directed. Take all of this medication unless your care team tells you to stop it early. Keep taking it even if you think you are better. Take products with antacids in them at a different time of day than this medication. Take this medication 4 hours before or 4 hours after antacids. Talk to your care team if you have questions. Talk to your care team about the use of this medication in children. While this medication may be prescribed for selected conditions, precautions do apply. Overdosage: If you think you have taken too much of this medicine contact a poison control center or emergency room at once. NOTE: This medicine is only for you. Do not share this medicine with others. What if I  miss a dose? If you miss a dose, take it as soon as you can. If it is almost time for your next dose, take only that dose. Do not take double or extra doses. What may interact with this medication? Do not take this medication with any of the following: Cisapride Dronedarone Pimozide Thioridazine This medication may also interact with the following: Ampicillin Antacids Cimetidine Cyclosporine Digoxin Kaolin Medications for diabetes, such as insulin, glipizide, glyburide Medications for seizures, such as carbamazepine, phenobarbital, phenytoin Mefloquine Methotrexate Other medications that cause heart rhythm changes Praziquantel This list may not describe all possible interactions. Give your health care provider a list of all the medicines, herbs, non-prescription drugs, or dietary supplements you use. Also tell them if you smoke, drink alcohol, or use illegal drugs. Some items may interact with your medicine. What should I watch for while using this medication? Visit your care team for regular checks on your progress. Tell your care team if your symptoms do not start to get better or if they get worse. You may need blood work done while you are taking this medication. If you take other medications that can affect heart rhythm, you may need more testing. Talk to your care team if you have questions. Your vision may be tested before and during use of this medication. Tell your care team right away if you have any change in your eyesight. This medication may cause serious skin reactions. They can happen weeks to months after starting the medication. Contact your care team right away if you notice fevers or flu-like symptoms with a rash. The rash may be red or purple and then  turn into blisters or peeling of the skin. Or, you might notice a red rash with swelling of the face, lips or lymph nodes in your neck or under your arms. If you or your family notice any changes in your behavior, such as  new or worsening depression, thoughts of harming yourself, anxiety, or other unusual or disturbing thoughts, or memory loss, call your care team right away. What side effects may I notice from receiving this medication? Side effects that you should report to your care team as soon as possible: Allergic reactions--skin rash, itching, hives, swelling of the face, lips, tongue, or throat Aplastic anemia--unusual weakness or fatigue, dizziness, headache, trouble breathing, increased bleeding or bruising Change in vision Heart rhythm changes--fast or irregular heartbeat, dizziness, feeling faint or lightheaded, chest pain, trouble breathing Infection--fever, chills, cough, or sore throat Low blood sugar (hypoglycemia)--tremors or shaking, anxiety, sweating, cold or clammy skin, confusion, dizziness, rapid heartbeat Muscle injury--unusual weakness or fatigue, muscle pain, dark yellow or brown urine, decrease in amount of urine Pain, tingling, or numbness in the hands or feet Rash, fever, and swollen lymph nodes Redness, blistering, peeling, or loosening of the skin, including inside the mouth Thoughts of suicide or self-harm, worsening mood, or feelings of depression Unusual bruising or bleeding Side effects that usually do not require medical attention (report to your care team if they continue or are bothersome): Diarrhea Headache Nausea Stomach pain Vomiting This list may not describe all possible side effects. Call your doctor for medical advice about side effects. You may report side effects to FDA at 1-800-FDA-1088. Where should I keep my medication? Keep out of the reach of children and pets. Store at room temperature up to 30 degrees C (86 degrees F). Protect from light. Get rid of any unused medication after the expiration date. To get rid of medications that are no longer needed or have expired: Take the medication to a medication take-back program. Check with your pharmacy or law  enforcement to find a location. If you cannot return the medication, check the label or package insert to see if the medication should be thrown out in the garbage or flushed down the toilet. If you are not sure, ask your care team. If it is safe to put it in the trash, empty the medication out of the container. Mix the medication with cat litter, dirt, coffee grounds, or other unwanted substance. Seal the mixture in a bag or container. Put it in the trash. NOTE: This sheet is a summary. It may not cover all possible information. If you have questions about this medicine, talk to your doctor, pharmacist, or health care provider.  2024 Elsevier/Gold Standard (2021-08-28 00:00:00)  Hand Exercises Hand exercises can be helpful for almost anyone. They can strengthen your hands and improve flexibility and movement. The exercises can also increase blood flow to the hands. These results can make your work and daily tasks easier for you. Hand exercises can be especially helpful for people who have joint pain from arthritis or nerve damage from using their hands over and over. These exercises can also help people who injure a hand. Exercises Most of these hand exercises are gentle stretching and motion exercises. It is usually safe to do them often throughout the day. Warming up your hands before exercise may help reduce stiffness. You can do this with gentle massage or by placing your hands in warm water for 10-15 minutes. It is normal to feel some stretching, pulling, tightness, or mild discomfort  when you begin new exercises. In time, this will improve. Remember to always be careful and stop right away if you feel sudden, very bad pain or your pain gets worse. You want to get better and be safe. Ask your health care provider which exercises are safe for you. Do exercises exactly as told by your provider and adjust them as told. Do not begin these exercises until told by your provider. Knuckle bend or "claw"  fist  Stand or sit with your arm, hand, and all five fingers pointed straight up. Make sure to keep your wrist straight. Gently bend your fingers down toward your palm until the tips of your fingers are touching your palm. Keep your big knuckle straight and only bend the small knuckles in your fingers. Hold this position for 10 seconds. Straighten your fingers back to your starting position. Repeat this exercise 5-10 times with each hand. Full finger fist  Stand or sit with your arm, hand, and all five fingers pointed straight up. Make sure to keep your wrist straight. Gently bend your fingers into your palm until the tips of your fingers are touching the middle of your palm. Hold this position for 10 seconds. Extend your fingers back to your starting position, stretching every joint fully. Repeat this exercise 5-10 times with each hand. Straight fist  Stand or sit with your arm, hand, and all five fingers pointed straight up. Make sure to keep your wrist straight. Gently bend your fingers at the big knuckle, where your fingers meet your hand, and at the middle knuckle. Keep the knuckle at the tips of your fingers straight and try to touch the bottom of your palm. Hold this position for 10 seconds. Extend your fingers back to your starting position, stretching every joint fully. Repeat this exercise 5-10 times with each hand. Tabletop  Stand or sit with your arm, hand, and all five fingers pointed straight up. Make sure to keep your wrist straight. Gently bend your fingers at the big knuckle, where your fingers meet your hand, as far down as you can. Keep the small knuckles in your fingers straight. Think of forming a tabletop with your fingers. Hold this position for 10 seconds. Extend your fingers back to your starting position, stretching every joint fully. Repeat this exercise 5-10 times with each hand. Finger spread  Place your hand flat on a table with your palm facing down. Make  sure your wrist stays straight. Spread your fingers and thumb apart from each other as far as you can until you feel a gentle stretch. Hold this position for 10 seconds. Bring your fingers and thumb tight together again. Hold this position for 10 seconds. Repeat this exercise 5-10 times with each hand. Making circles  Stand or sit with your arm, hand, and all five fingers pointed straight up. Make sure to keep your wrist straight. Make a circle by touching the tip of your thumb to the tip of your index finger. Hold for 10 seconds. Then open your hand wide. Repeat this motion with your thumb and each of your fingers. Repeat this exercise 5-10 times with each hand. Thumb motion  Sit with your forearm resting on a table and your wrist straight. Your thumb should be facing up toward the ceiling. Keep your fingers relaxed as you move your thumb. Lift your thumb up as high as you can toward the ceiling. Hold for 10 seconds. Bend your thumb across your palm as far as you can, reaching the  tip of your thumb for the small finger (pinkie) side of your palm. Hold for 10 seconds. Repeat this exercise 5-10 times with each hand. Grip strengthening  Hold a stress ball or other soft ball in the middle of your hand. Slowly increase the pressure, squeezing the ball as much as you can without causing pain. Think of bringing the tips of your fingers into the middle of your palm. All of your finger joints should bend when doing this exercise. Hold your squeeze for 10 seconds, then relax. Repeat this exercise 5-10 times with each hand. Contact a health care provider if: Your hand pain or discomfort gets much worse when you do an exercise. Your hand pain or discomfort does not improve within 2 hours after you exercise. If you have either of these problems, stop doing these exercises right away. Do not do them again unless your provider says that you can. Get help right away if: You develop sudden, severe hand  pain or swelling. If this happens, stop doing these exercises right away. Do not do them again unless your provider says that you can. This information is not intended to replace advice given to you by your health care provider. Make sure you discuss any questions you have with your health care provider. Document Revised: 03/06/2022 Document Reviewed: 03/06/2022 Elsevier Patient Education  2024 [employer] Exercises for Chronic Knee Pain Chronic knee pain is pain that lasts longer than 3 months. For most people with chronic knee pain, exercise and weight loss is an important part of treatment. Your health care provider may want you to focus on: Making the muscles that support your knee stronger. This can take pressure off your knee and reduce pain. Preventing knee stiffness. How far you can move your knee, keeping it there or making it farther. Losing weight (if this applies) to take pressure off your knee, lower your risk for injury, and make it easier for you to exercise. Your provider will help you make an exercise program that fits your needs and physical abilities. Below are simple, low-impact exercises you can do at home. Ask your provider or physical therapist how often you should do your exercise program and how many times to repeat each exercise. General safety tips  Get your provider's approval before doing any exercises. Start slowly and stop any time you feel pain. Do not exercise if your knee pain is flaring up. Warm up first. Stretching a cold muscle can cause an injury. Do 5-10 minutes of easy movement or light stretching before beginning your exercises. Do 5-10 minutes of low-impact activity (like walking or cycling) before starting strengthening exercises. Contact your provider any time you have pain during or after exercising. Exercise can cause discomfort but should not be painful. It is normal to be a little stiff or sore after exercising. Stretching and range-of-motion  exercises Front thigh stretch  Stand up straight and support your body by holding on to a chair or resting one hand on a wall. With your legs straight and close together, bend one knee to lift your heel up toward your butt. Using one hand for support, grab your ankle with your free hand. Pull your foot up closer toward your butt to feel the stretch in front of your thigh. Hold the stretch for 30 seconds. Repeat __________ times. Complete this exercise __________ times a day. Back thigh stretch  Sit on the floor with your back straight and your legs out straight in front of you. Place the  palms of your hands on the floor and slide them toward your feet as you bend at the hip. Try to touch your nose to your knees and feel the stretch in the back of your thighs. Hold for 30 seconds. Repeat __________ times. Complete this exercise __________ times a day. Calf stretch  Stand facing a wall. Place the palms of your hands flat against the wall, arms extended, and lean slightly against the wall. Get into a lunge position with one leg bent at the knee and the other leg stretched out straight behind you. Keep both feet facing the wall and increase the bend in your knee while keeping the heel of the other leg flat on the ground. You should feel the stretch in your calf. Hold for 30 seconds. Repeat __________ times. Complete this exercise __________ times a day. Strengthening exercises Straight leg lift  Lie on your back with one knee bent and the other leg out straight. Slowly lift the straight leg without bending the knee. Lift until your foot is about 12 inches (30 cm) off the floor. Hold for 3-5 seconds and slowly lower your leg. Repeat __________ times. Complete this exercise __________ times a day. Single leg dip  Stand between two chairs and put both hands on the backs of the chairs for support. Extend one leg out straight with your body weight resting on the heel of the standing  leg. Slowly bend your standing knee to dip your body to the level that is comfortable for you. Hold for 3-5 seconds. Repeat __________ times. Complete this exercise __________ times a day. Hamstring curls  Stand straight, knees close together, facing the back of a chair. Hold on to the back of a chair with both hands. Keep one leg straight. Bend the other knee while bringing the heel up toward the butt until the knee is bent at a 90-degree angle (right angle). Hold for 3-5 seconds. Repeat __________ times. Complete this exercise __________ times a day. Wall squat  Stand straight with your back, hips, and head against a wall. Step forward one foot at a time with your back still against the wall. Your feet should be 2 feet (61 cm) from the wall at shoulder width. Keeping your back, hips, and head against the wall, slide down the wall to as close to a sitting position as you can get. Hold for 5-10 seconds, then slowly slide back up. Repeat __________ times. Complete this exercise __________ times a day. Step-ups  Stand in front of a sturdy platform or stool that is about 6 inches (15 cm) high. Slowly step up with your left / right foot, keeping your knee in line with your hip and foot. Do not let your knee bend so far that you cannot see your toes. Hold on to a chair for balance, but do not use it for support. Slowly unlock your knee and lower yourself to the starting position. Repeat __________ times. Complete this exercise __________ times a day. Contact a health care provider if: Your exercises cause pain. Your pain is worse after you exercise. Your pain prevents you from doing your exercises. This information is not intended to replace advice given to you by your health care provider. Make sure you discuss any questions you have with your health care provider. Document Revised: 03/06/2022 Document Reviewed: 03/06/2022 Elsevier Patient Education  2024 [employer].

## 2023-02-19 NOTE — Progress Notes (Unsigned)
Initial neurology clinic note  Reason for Evaluation: Consultation requested by Denise Aldo, NP for an opinion regarding neuropathy, imbalance, tremor. My final recommendations will be communicated back to the requesting physician by way of shared medical record or letter to requesting physician via [place] mail.  HPI: This is [name], a 82 y.o. 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 The patient is alone today.  Patient has had symptoms for 5-10 years. She describes pain, numbness, tingling, and burning in her legs and hands. It started very slowly and has been slowly progressive. She now has difficulty walking with imbalance. She denies any falls, but has near falls requiring her to catch herself. She does not use an assistive device to help her walk. She takes tramadol and tylenol for pain. She has not previously tried gabapentin and Lyrica. She has had electric stimulation therapy previously, which may have helped for a short period of time. She does not remember ever having an EMG.  Patient has occasional tremor in her hands and mouth. These has been going on about 6 months. She thinks it is at rest and intermittent. She thinks it is worse with stress. It does not last long. Her handwriting is worse, but not small. She thinks her smell may not be as good as normal. She denies clear freezing while walking. She is not aware of clear REM sleep disorder, but she does wake up with all the covers removed.  She has not had recent PT. She mentions that she is supposed to start PT at the beginning of the year. This has been ordered by PCP.  Patient is followed in rheumatology by Dr. Corliss Skains for mixed connective tissue disease  (last seen 02/19/23). Per that note: Visit Diagnoses: MCTD (mixed connective tissue disease) (HCC) - Positive ANA, positive RNP, positive SSA, positive dsDNA, positive RF, elevated sedimentation rate: I did detailed discussion with the patient regarding the lab results.  Complements are normal and sed rate is mildly elevated.  Signs dry mouth she does not have any other symptoms of autoimmune disease.  She complains of joint pain but no synovitis was noted.  She has osteoarthritis involving multiple joints.  I discussed possible use of hydroxychloroquine.  Side effects of hydroxychloroquine were discussed at length.  Patient states she is uncertain if she wants to take any medications for mixed connective tissue disease as she does not have much symptoms.  I provided her with a handout to review.  I also advised her to contact us if she develops any new symptoms.   Pain in both hands -she complains of discomfort in her bilateral hands.  No synovitis was noted.  Bilateral CMC PIP and DIP thickening was noted.  X-rays obtained at the last visit of bilateral hands were suggestive of osteoarthritis.  X-ray findings were reviewed with the patient.  A handout on hand exercises was provided.   Chronic pain of both knees -she complains of discomfort in the bilateral knee joints.  No warmth swelling or effusion was noted.  X-rays obtained at the last visit showed bilateral knee joint moderate osteoarthritis.  Detail counseled regarding osteoarthritis was provided.  A handout on lower extremity exercises was given.   Pain in right hip -she complains of right hip  joint.  X-rays obtained at the last visit showed osteoarthritic changes.  Results were reviewed with the patient.   Pain in both feet-she complains of discomfort in her feet.  No synovitis was noted.  X-rays were suggestive of osteoarthritis.  She does not report any constitutional symptoms like fever or unintentional weight loss.  EtOH use: None  currently, but did many years ago  Restrictive diet? She does not eat often for many years (maybe 1 meal and an ensure each day) Family history of neuropathy/myopathy/NM disease? Mother with Alzheimer   MEDICATIONS:  Outpatient Encounter Medications as of 02/20/2023  Medication Sig   acetaminophen (TYLENOL) 650 MG CR tablet Take 1,300 mg by mouth 2 (two) times a day.   Cholecalciferol (VITAMIN D3) 125 MCG (5000 UT) CAPS Take by mouth.   hydrochlorothiazide (HYDRODIURIL) 25 MG tablet Take 25 mg by mouth daily.    lidocaine (LIDODERM) 5 % Place 1 patch onto the skin daily. Remove & Discard patch within 12 hours or as directed by MD   losartan (COZAAR) 100 MG tablet Take 100 mg by mouth daily.   metoprolol succinate (TOPROL-XL) 25 MG 24 hr tablet TAKE 1/2 TABLET BY MOUTH EVERY EVENING   Multiple Vitamins-Minerals (CENTRUM SILVER ADULT 50+) TABS Take 1 tablet by mouth daily.   pantoprazole (PROTONIX) 40 MG tablet TAKE ONE TABLET BY MOUTH EVERY EVENING   polyethylene glycol (MIRALAX / GLYCOLAX) packet Take 17 g by mouth daily.   rivaroxaban (XARELTO) 20 MG TABS tablet Take 20 mg by mouth daily with supper.   rosuvastatin (CRESTOR) 20 MG tablet TAKE ONE TABLET BY MOUTH EVERY EVENING   traMADol (ULTRAM) 50 MG tablet Take 1 tablet (50 mg total) by mouth every 6 (six) hours as needed for moderate pain.   traZODone (DESYREL) 50 MG tablet Take 50 mg by mouth at bedtime.   triamcinolone (KENALOG) 0.025 % cream Apply 1 application topically daily as needed for itching.   aspirin EC 81 MG tablet Take 81 mg by mouth daily. Swallow whole. (Patient not taking: Reported on 02/20/2023)   buPROPion (WELLBUTRIN SR) 150 MG 12 hr tablet Take 150 mg by mouth 2 (two) times daily. (Patient not taking: Reported on 02/19/2023)   Colesevelam HCl 3.75 g PACK Take by mouth. (Patient not taking: Reported on 02/20/2023)   Magnesium 250 MG TABS Take by mouth. (Patient not taking: Reported on 02/20/2023)   saccharomyces  boulardii (FLORASTOR) 250 MG capsule Take 250 mg by mouth 2 (two) times daily. (Patient not taking: Reported on 02/20/2023)   sertraline (ZOLOFT) 100 MG tablet Take 100 mg by mouth daily. (Patient not taking: Reported on 02/20/2023)   sertraline (ZOLOFT) 50 MG tablet  (Patient not taking: Reported on 02/20/2023)   Vitamin D, Ergocalciferol, (DRISDOL) 1.25 MG (50000 UNIT) CAPS capsule TAKE ONE CAPSULE BY MOUTH ONCE WEEKLY ON MONDAY (Patient not taking: Reported on 02/20/2023)   No facility-administered encounter medications on file as of 02/20/2023.    PAST MEDICAL HISTORY: Past Medical History:  Diagnosis Date   Acute colitis    Anxiety    B12 deficiency    Bilateral swelling of feet    Constipation    COPD (chronic obstructive pulmonary disease) (HCC)    Depression    DVT (deep venous thrombosis) (HCC) 2014, 2017   Fibroid    Gallbladder problem    Heart valve problem    High cholesterol    History of shingles    Hypertension    IBS (irritable bowel syndrome)  Joint pain    PE (pulmonary thromboembolism) (HCC) 2017   SOB (shortness of breath)    Thyroid disease    Nodules   Varicose veins    Vitamin D deficiency     PAST SURGICAL HISTORY: Past Surgical History:  Procedure Laterality Date   ABDOMINAL HYSTERECTOMY     CHOLECYSTECTOMY N/A 11/05/2017   Procedure: LAPAROSCOPIC CHOLECYSTECTOMY WITH INTRAOPERATIVE CHOLANGIOGRAM;  Surgeon: Griselda Miner, MD;  Location: [facility] OR;  Service: General;  Laterality: N/A;   COLONOSCOPY N/A 10/05/2014   Procedure: COLONOSCOPY;  Surgeon: Bernette Redbird, MD;  Location: [facility] ENDOSCOPY;  Service: Endoscopy;  Laterality: N/A;   ERCP N/A 11/04/2017   Procedure: ENDOSCOPIC RETROGRADE CHOLANGIOPANCREATOGRAPHY (ERCP) Balloon Extraction;  Surgeon: Kerin Salen, MD;  Location: [facility] ENDOSCOPY;  Service: Gastroenterology;  Laterality: N/A;   LAPAROSCOPIC CHOLECYSTECTOMY W/ CHOLANGIOGRAPHY  11/05/2017   Dr. Carolynne Edouard   OOPHORECTOMY     One ovary removed    SPHINCTEROTOMY  11/04/2017   Procedure: SPHINCTEROTOMY;  Surgeon: Kerin Salen, MD;  Location: [facility] ENDOSCOPY;  Service: Gastroenterology;;   TOTAL THYROIDECTOMY      ALLERGIES: Allergies  Allergen Reactions   Penicillins Anaphylaxis    Has patient had a PCN reaction causing immediate rash, facial/tongue/throat swelling, SOB or lightheadedness with hypotension: yes Has patient had a PCN reaction causing severe rash involving mucus membranes or skin necrosis: no Has patient had a PCN reaction that required hospitalization yes Has patient had a PCN reaction occurring within the last 10 years: no If all of the above answers are "NO", then may proceed with Cephalosporin use.    Sulfa Antibiotics Hives   Ace Inhibitors Other (See Comments)   Atorvastatin Other (See Comments)   Citrus Itching and Other (See Comments)    Can only eat if taking Benadryl   Oxycodone Other (See Comments)    Hallucinations    Sulfamethoxazole-Trimethoprim Other (See Comments)   Vicodin [Hydrocodone-Acetaminophen] Other (See Comments)    "I don't know where I am"    FAMILY HISTORY: Family History  Problem Relation Age of Onset   Crohn's disease Mother    Alzheimer's disease Mother    Hypertension Father    Stroke Father    Sudden death Father    Aneurysm Father    Cancer Sister    Diabetes Sister    Hypertension Sister    Prostate cancer Brother    Stroke Brother    Drug abuse Son    Other Son        overdose in 2019   Colon cancer Son     SOCIAL HISTORY: Social History   Tobacco Use   Smoking status: Former    Current packs/day: 0.00    Average packs/day: 2.0 packs/day for 40.0 years (80.0 ttl pk-yrs)    Types: Cigarettes    Start date: 05/30/1968    Quit date: 05/30/2008    Years since quitting: 14.7    Passive exposure: Current (minimal)   Smokeless tobacco: Former    Quit date: 05/12/2008  Vaping Use   Vaping status: Never Used  Substance Use Topics   Alcohol use: Yes    Comment:  Rare   Drug use: No   Social History   Social History Narrative   Are you right handed or left handed? Left hand   Are you currently employed ?    What is your current occupation? retired   Do you live at home alone? no   Who lives with you? Son  What type of home do you live in: 1 story or 2 story? one    Caffeine 1 cup a day     OBJECTIVE: PHYSICAL EXAM: BP 112/80   Pulse 67   Ht 5\' 6"  (1.676 m)   Wt 209 lb (94.8 kg)   SpO2 95%   BMI 33.73 kg/m   General: General appearance: Awake and alert. No distress. Cooperative with exam.  Skin: No obvious rash or jaundice. HEENT: Atraumatic. Anicteric. Lungs: Non-labored breathing on room air  Extremities: Edema in lower extremities (L > R). Psych: Affect appropriate.  Neurological: Mental Status: Alert. Speech fluent. No pseudobulbar affect Cranial Nerves: CNII: No RAPD. Visual fields grossly intact. CNIII, IV, VI: PERRL. No nystagmus. EOMI. CN V: Facial sensation intact bilaterally to fine touch. [place] clench strong. Jaw jerk negative. CN VII: Facial muscles symmetric and strong. No ptosis at rest. CN VIII: Hearing grossly intact bilaterally. CN IX: No hypophonia. CN X: Palate elevates symmetrically. CN XI: Full strength shoulder shrug bilaterally. CN XII: Tongue protrusion full and midline. No atrophy or fasciculations. No significant dysarthria Motor: Tone is normal. No tremor appreciated today at rest or with action.  Individual muscle group testing (MRC grade out of 5):  Movement     Neck flexion 5    Neck extension 5     Right Left   Shoulder abduction 5 5   Elbow flexion 5 5   Elbow extension 5 5   Finger abduction - FDI 5 5   Finger abduction - ADM 5 5   Finger extension 5 5   Finger distal flexion - 2/3 5 5    Finger distal flexion - 4/5 5 5    Thumb flexion - FPL 5 5    Hip flexion 5- 5-   Hip extension 5 5   Hip adduction 5 5   Hip abduction 5 5   Knee extension 5 5   Knee flexion 5 5    Dorsiflexion 5 5   Plantarflexion 5 5    Reflexes:  Right Left   Bicep 2+ 2+   Tricep 2+ 2+   BrRad 2+ 2+   Knee 2+ 2+   Ankle Trace Trace    Pathological Reflexes: Babinski: flexor response bilaterally Hoffman: absent bilaterally Troemner: absent bilaterally Sensation: Pinprick: Intact in upper extremities. Diminished to just below the knee in bilateral lower extremities Vibration: Intact in bilateral upper extremities. Absent in bilateral lower extremities to the knee. Proprioception: Absent in bilateral great toes. Coordination: Intact finger-to- nose-finger bilaterally. Finger tapping and toe tapping normal with no decrement. Gait: Wide based gait. Normal arm swing. No obvious freezing. No en bloc turns. Unsteady, especially when turning.  Lab and Test Review: Internal labs: 01/23/23: ANA positive (1:1280) DsDNA positive at 5 SSA > 8 positive SSB negative Anti-Sm negative RNP positive > 8.0 Scl-70 negative CCP negative RF positive ESR elevated at 36 CK 173 (130 13 years ago)  External labs: 01/01/23: CMP significant for Cr 1.07 CBC unremarkable SSA positive, SSB negative ANA positive (1:80) RF positive CRP wnl HbA1c: 6.4  Imaging: CT head wo contrast (08/31/22): FINDINGS: Brain: no acute territorial infarction, hemorrhage, or intracranial mass. Ventricles are nonenlarged. Mild atrophy. Mild to moderate white matter hypodensity consistent with chronic small vessel ischemic change.   Vascular: Hyperdense vessels.  Carotid vascular calcification   Skull: Normal. Negative for fracture or focal lesion.   Sinuses/Orbits: Mucosal thickening in the sinuses   Other: None   IMPRESSION: 1. No CT evidence  for acute intracranial abnormality. 2. Atrophy and chronic small vessel ischemic changes of the white matter.  CTA head and neck (08/25/21): CTA NECK FINDINGS   SKELETON: There is no bony spinal canal stenosis. No lytic or blastic lesion.   OTHER  NECK: Right thyroid lobe is enlarged, possibly compensatory to the atrophic or absent left lobe.   UPPER CHEST: No pneumothorax or pleural effusion. No nodules or masses.   AORTIC ARCH:   There is no calcific atherosclerosis of the aortic arch. There is no aneurysm, dissection or hemodynamically significant stenosis of the visualized portion of the aorta. Conventional 3 vessel aortic branching pattern. The visualized proximal subclavian arteries are widely patent.   RIGHT CAROTID SYSTEM: No dissection, occlusion or aneurysm. Mild atherosclerotic calcification at the carotid bifurcation without hemodynamically significant stenosis.   LEFT CAROTID SYSTEM: Normal without aneurysm, dissection or stenosis.   VERTEBRAL ARTERIES: Left dominant configuration. Both origins are clearly patent. There is no dissection, occlusion or flow-limiting stenosis to the skull base (V1-V3 segments).   CTA HEAD FINDINGS   POSTERIOR CIRCULATION:   --Vertebral arteries: Normal V4 segments.   --Inferior cerebellar arteries: Normal.   --Basilar artery: Normal.   --Superior cerebellar arteries: Normal.   --Posterior cerebral arteries (PCA): Normal.   ANTERIOR CIRCULATION:   --Intracranial internal carotid arteries: Normal.   --Anterior cerebral arteries (ACA): Normal. Both A1 segments are present. Patent anterior communicating artery (a-comm).   --Middle cerebral arteries (MCA): Normal.   VENOUS SINUSES: As permitted by contrast timing, patent.   ANATOMIC VARIANTS: None   Review of the MIP images confirms the above findings.   IMPRESSION: 1. No emergent large vessel occlusion or hemodynamically significant stenosis of the head or neck. 2. Chronic ischemic microangiopathy.  Cervical spine xray (08/24/21): FINDINGS: There is straightening of normal cervical lordosis. There is no evidence of cervical spine fracture or prevertebral soft tissue swelling. Alignment is normal. There is  mild disc space narrowing and endplate osteophyte formation at C4-C5 and C5-C6 compatible with degenerative change.   IMPRESSION: 1. No evidence for fracture or malalignment. 2. Mild to moderate degenerative changes at C4-C5 and C5-C6.  MRI brain w/wo contrast (07/30/2018): IMPRESSION: MRI HEAD IMPRESSION:   1. No acute intracranial infarct or other abnormality identified. 2. Generalized age-related cerebral atrophy with mild chronic small vessel ischemic disease. 3. Empty sella.  ASSESSMENT: Denise Cline is a 82 y.o. female who presents for evaluation of numbness and tingling in legs and hands, imbalance, and tremor. 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 Her neurological examination is pertinent for diminished sensation in bilateral lower extremities in a length dependent fashion and wide based, unsteady gait. Available diagnostic data is significant for HbA1c of 6.4, positive ANA, SSA, dsDNA, RF. MRI brain in 2020 showed age related generalized atrophy.  Patient's symptoms are likely multifactorial with contributions from arthritis, PVD, lower extremity edema, and peripheral neuropathy. 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 This is likely a major contributor to her imbalance. Her known risk factors include mixed connective tissue disease (sensory neuronopathy associated with Sjogren's and SSA positivity), pre-DM and possible prior heavy EtOH use, though this was many years ago. I will send labs to look for other treatable causes. The etiology of patient's tremor is unclear as I did not see a tremor today. Of note, patient has no current evidence of parkinsonism (no  bradykinesia, increased tone in extremities). I will closely monitor this though.  PLAN: -Blood work: B1, B12, IFE -Agree with PT - has  already been ordered by PCP. Patient to let me or PCP know if this does not start as planned -Lidocaine cream PRN -Discussed neuropathic pain medications, but patient deferred for now  -Return to clinic in 6 months or sooner if needed  The impression above as well as the plan as outlined below were extensively discussed with the patient who voiced understanding. All questions were answered to their satisfaction.  The patient was counseled on pertinent fall precautions per the printed material provided today, and as noted under the "Patient Instructions" section below.  When available, results of the above investigations and possible further recommendations will be communicated to the patient via telephone/MyChart. Patient to call office if not contacted after expected testing turnaround time.   Total time spent reviewing records, interview, history/exam, documentation, and coordination of care on day of encounter:  70 min   Thank you for allowing me to participate in patient's care.  If I can answer any additional questions, I would be pleased to do so.  Jacquelyne Balint, MD   CC: Denise Aldo, NP [address] [place] [address]  CC: Referring provider: [name], NP [address] [place],  [place] [address]

## 2023-02-20 ENCOUNTER — Encounter: Payer: Self-pay | Admitting: Neurology

## 2023-02-20 ENCOUNTER — Ambulatory Visit: Payer: Medicare HMO | Admitting: Neurology

## 2023-02-20 ENCOUNTER — Other Ambulatory Visit: Payer: Medicare HMO

## 2023-02-20 VITALS — BP 112/80 | HR 67 | Ht 66.0 in | Wt 209.0 lb

## 2023-02-20 DIAGNOSIS — I739 Peripheral vascular disease, unspecified: Secondary | ICD-10-CM | POA: Diagnosis not present

## 2023-02-20 DIAGNOSIS — M351 Other overlap syndromes: Secondary | ICD-10-CM

## 2023-02-20 DIAGNOSIS — M79604 Pain in right leg: Secondary | ICD-10-CM | POA: Diagnosis not present

## 2023-02-20 DIAGNOSIS — M7989 Other specified soft tissue disorders: Secondary | ICD-10-CM

## 2023-02-20 DIAGNOSIS — G629 Polyneuropathy, unspecified: Secondary | ICD-10-CM | POA: Diagnosis not present

## 2023-02-20 DIAGNOSIS — R2689 Other abnormalities of gait and mobility: Secondary | ICD-10-CM

## 2023-02-20 DIAGNOSIS — M79605 Pain in left leg: Secondary | ICD-10-CM

## 2023-02-20 NOTE — Patient Instructions (Addendum)
I saw you today for the numbness, tingling, and burning in your legs and hands. Your symptoms are consistent with nerve damage, called neuropathy. This is likely the cause of your imbalance as well.  I see no evidence of parkinson's disease today.  You can try Lidocaine cream as needed for pain in the legs. Apply wear you have pain, tingling, or burning. Wear gloves to prevent your hands being numb. This can be bought over the counter at any drug store or online.  We discussed nerve pain medications, but you wanted to hold off for now. If you change your mind, let me know.  I want to check some labs today to look for other treatable causes of nerve damage. I will be in touch when I have your results.  I will see you back in 6 months.  The physicians and staff at [facility] are committed to providing excellent care. You may receive a survey requesting feedback about your experience at our office. We strive to receive "very good" responses to the survey questions. If you feel that your experience would prevent you from giving the office a "very good " response, please contact our office to try to remedy the situation. We may be reached at [phone]. Thank you for taking the time out of your busy day to complete the survey.  Jacquelyne Balint, MD [facility]  Preventing Falls at [facility] are common, often dreaded events in the lives of older people. Aside from the obvious injuries and even death that may result, fall can cause wide-ranging consequences including loss of independence, mental decline, decreased activity and mobility. Younger people are also at risk of falling, especially those with chronic illnesses and fatigue.  Ways to reduce risk for falling Examine diet and medications. Warm foods and alcohol dilate blood vessels, which can lead to dizziness when standing. Sleep aids, antidepressants and pain medications can also increase the likelihood of a fall.  Get a vision  exam. Poor vision, cataracts and glaucoma increase the chances of falling.  Check foot gear. Shoes should fit snugly and have a sturdy, nonskid sole and a broad, low heel  Participate in a physician-approved exercise program to build and maintain muscle strength and improve balance and coordination. Programs that use ankle weights or stretch bands are excellent for muscle-strengthening. Water aerobics programs and low-impact Tai Chi programs have also been shown to improve balance and coordination.  Increase vitamin D intake. Vitamin D improves muscle strength and increases the amount of calcium the body is able to absorb and deposit in bones.  How to prevent falls from common hazards Floors - Remove all loose wires, cords, and throw rugs. Minimize clutter. Make sure rugs are anchored and smooth. Keep furniture in its usual place.  Chairs -- Use chairs with straight backs, armrests and firm seats. Add firm cushions to existing pieces to add height.  Bathroom - Install grab bars and non-skid tape in the tub or shower. Use a bathtub transfer bench or a shower chair with a back support Use an elevated toilet seat and/or safety rails to assist standing from a low surface. Do not use towel racks or bathroom tissue holders to help you stand.  Lighting - Make sure halls, stairways, and entrances are well-lit. Install a night light in your bathroom or hallway. Make sure there is a light switch at the top and bottom of the staircase. Turn lights on if you get up in the middle of the night. Make  sure lamps or light switches are within reach of the bed if you have to get up during the night.  Kitchen - Install non-skid rubber mats near the sink and stove. Clean spills immediately. Store frequently used utensils, pots, pans between waist and eye level. This helps prevent reaching and bending. Sit when getting things out of lower cupboards.  Living room/ Bedrooms - Place furniture with wide spaces in between,  giving enough room to move around. Establish a route through the living room that gives you something to hold onto as you walk.  Stairs - Make sure treads, rails, and rugs are secure. Install a rail on both sides of the stairs. If stairs are a threat, it might be helpful to arrange most of your activities on the lower level to reduce the number of times you must climb the stairs.  Entrances and doorways - Install metal handles on the walls adjacent to the doorknobs of all doors to make it more secure as you travel through the doorway.  Tips for maintaining balance Keep at least one hand free at all times. Try using a backpack or fanny pack to hold things rather than carrying them in your hands. Never carry objects in both hands when walking as this interferes with keeping your balance.  Attempt to swing both arms from front to back while walking. This might require a conscious effort if Parkinson's disease has diminished your movement. It will, however, help you to maintain balance and posture, and reduce fatigue.  Consciously lift your feet off of the ground when walking. Shuffling and dragging of the feet is a common culprit in losing your balance.  When trying to navigate turns, use a "U" technique of facing forward and making a wide turn, rather than pivoting sharply.  Try to stand with your feet shoulder-length apart. When your feet are close together for any length of time, you increase your risk of losing your balance and falling.  Do one thing at a time. Don't try to walk and accomplish another task, such as reading or looking around. The decrease in your automatic reflexes complicates motor function, so the less distraction, the better.  Do not wear rubber or gripping soled shoes, they might "catch" on the floor and cause tripping.  Move slowly when changing positions. Use deliberate, concentrated movements and, if needed, use a grab bar or walking aid. Count 15 seconds between each  movement. For example, when rising from a seated position, wait 15 seconds after standing to begin walking.  If balance is a continuous problem, you might want to consider a walking aid such as a cane, walking stick, or walker. Once you've mastered walking with help, you might be ready to try it on your own again.

## 2023-02-24 LAB — VITAMIN B1: Vitamin B1 (Thiamine): 17 nmol/L (ref 8–30)

## 2023-02-24 LAB — VITAMIN B12: Vitamin B-12: 608 pg/mL (ref 200–1100)

## 2023-02-24 LAB — IMMUNOFIXATION ELECTROPHORESIS
IgG (Immunoglobin G), Serum: 1585 mg/dL — ABNORMAL HIGH (ref 600–1540)
IgM, Serum: 44 mg/dL — ABNORMAL LOW (ref 50–300)
Immunoglobulin A: 440 mg/dL — ABNORMAL HIGH (ref 70–320)

## 2023-03-07 ENCOUNTER — Ambulatory Visit
Admission: RE | Admit: 2023-03-07 | Discharge: 2023-03-07 | Disposition: A | Discharge status: Home / Self Care | Payer: Medicare Other | Source: Ambulatory Visit | Attending: Student | Admitting: Student

## 2023-03-07 DIAGNOSIS — R1310 Dysphagia, unspecified: Secondary | ICD-10-CM

## 2023-03-08 ENCOUNTER — Ambulatory Visit: Payer: Medicare HMO | Admitting: Neurology

## 2023-04-16 ENCOUNTER — Ambulatory Visit: Payer: Medicare Other | Attending: Cardiology | Admitting: Cardiology

## 2023-04-16 ENCOUNTER — Encounter: Payer: Self-pay | Admitting: Cardiology

## 2023-04-16 VITALS — BP 112/70 | HR 58 | Ht 66.0 in | Wt 197.2 lb

## 2023-04-16 DIAGNOSIS — I2510 Atherosclerotic heart disease of native coronary artery without angina pectoris: Secondary | ICD-10-CM

## 2023-04-16 DIAGNOSIS — I10 Essential (primary) hypertension: Secondary | ICD-10-CM | POA: Diagnosis not present

## 2023-04-16 DIAGNOSIS — I77819 Aortic ectasia, unspecified site: Secondary | ICD-10-CM | POA: Diagnosis not present

## 2023-04-16 DIAGNOSIS — E785 Hyperlipidemia, unspecified: Secondary | ICD-10-CM

## 2023-04-16 NOTE — Progress Notes (Signed)
Cardiology Office Note:  .   Date:  04/16/2023  ID:  Denise Cline, DOB Jul 25, 1940, MRN [id number] PCP: Denise Aldo, NP  [facility] Providers Cardiologist:  Donato Schultz, MD     History of Present Illness: .   Denise Cline is a 83 y.o. female Discussed with the use of AI scribe  History of Present Illness   Denise Cline is an 83 year old female with coronary artery disease and ascending aortic dilatation who presents for follow-up.  She is being followed for coronary artery disease, identified in 2023 via a screening CT scan. She is currently managed with Lipitor and reports no chest pain.  Her ascending aortic dilatation was noted in 2023, measuring 4.4 centimeters on a CT angiogram. This condition is being monitored with annual CT scans.  Her hypertension is well controlled with hydrochlorothiazide 25 mg daily, losartan 100 mg daily, and metoprolol succinate 12.5 mg daily.  She has a history of recurrent pulmonary embolism and is on Xarelto 20 mg daily for prior PE/DVT.  Her past medical history also includes chronic obstructive pulmonary disease (COPD) and peripheral vascular disease (PVD).     Grieving the loss of her son.  Had a right sided brain cancer, ended up on hospice.  She denies any chest pain shortness of breath fevers chills nausea vomiting      Studies Reviewed: Marland Kitchen   EKG Interpretation Date/Time:  Tuesday April 16 2023 14:55:58 EST Ventricular Rate:  61 PR Interval:  144 QRS Duration:  72 QT Interval:  438 QTC Calculation: 440 R Axis:   -3  Text Interpretation: Normal sinus rhythm Poor R wave progression (cited on or before 24-Aug-2021) When compared with ECG of 24-Aug-2021 18:08, Questionable change in initial forces of Anterior leads Confirmed by Donato Schultz ([id number]) on 04/16/2023 3:04:53 PM    Results   RADIOLOGY CT angiogram of the aorta: Fusiform dilatation with diameter 4.4 cm (2023)     Risk Assessment/Calculations:             Physical Exam:   VS:  BP 112/70   Pulse (!) 58   Ht 5\' 6"  (1.676 m)   Wt 197 lb 3.2 oz (89.4 kg)   SpO2 98%   BMI 31.83 kg/m    Wt Readings from Last 3 Encounters:  04/16/23 197 lb 3.2 oz (89.4 kg)  02/20/23 209 lb (94.8 kg)  02/19/23 208 lb (94.3 kg)    GEN: Well nourished, well developed in no acute distress NECK: No JVD; No carotid bruits CARDIAC: RRR, no murmurs, no rubs, no gallops RESPIRATORY:  Clear to auscultation without rales, wheezing or rhonchi  ABDOMEN: Soft, non-tender, non-distended EXTREMITIES:  No edema; No deformity   ASSESSMENT AND PLAN: .    Assessment and Plan    Coronary Artery Disease Follow-up for coronary artery disease identified by screening CT scan. No current chest pain. She is on atorvastatin for prevention. - Continue atorvastatin  Ascending Aortic Dilatation Ascending aortic dilatation measuring 4.4 cm noted on CT angiogram in 2023. No new symptoms reported. - Repeat CT scan annually through Dr. Laneta Simmers  Peripheral Vascular Disease (PVD) PVD without specific symptoms or interventions discussed during the visit.  Hypertension Hypertension well controlled with hydrochlorothiazide 25 mg daily, losartan 100 mg daily, metoprolol succinate 12.5 mg daily. 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 Currently on rivaroxaban 20 mg daily. - Continue rivaroxaban 20 mg daily  Chronic Obstructive Pulmonary Disease (COPD) COPD without specific symptoms or exacerbations discussed during the visit.              Signed, Donato Schultz, MD

## 2023-04-16 NOTE — Patient Instructions (Addendum)
Medication Instructions:  Please discontinue your metoprolol. Continue all other medications as listed.  *If you need a refill on your cardiac medications before your next appointment, please call your pharmacy*  You have been referred to [facility].     Follow-Up: At [facility], you and your health needs are our priority.  As part of our continuing mission to provide you with exceptional heart care, we have created designated Provider Care Teams.  These Care Teams include your primary Cardiologist (physician) and Advanced Practice Providers (APPs -  Physician Assistants and Nurse Practitioners) who all work together to provide you with the care you need, when you need it.  We recommend signing up for the patient portal called "MyChart".  Sign up information is provided on this After Visit Summary.  MyChart is used to connect with patients for Virtual Visits (Telemedicine).  Patients are able to view lab/test results, encounter notes, upcoming appointments, etc.  Non-urgent messages can be sent to your provider as well.   To learn more about what you can do with MyChart, go to [email or web address].    Your next appointment:   1 year(s)  Provider:   Dr Donato Schultz

## 2023-06-06 NOTE — Progress Notes (Unsigned)
 Office Visit Note  Patient: Denise Cline             Date of Birth: [birth year]           MRN: [id number]             PCP: Maryellen Snare, NP Referring: Maryellen Snare, NP Visit Date: 06/20/2023 Occupation: @GUAROCC @  Subjective:  Pain in both feet  History of Present Illness: Denise Cline is a 83 y.o. female with history of mixed connective tissue disease.  She is not currently taking any immunosuppressive agents.  At the patient's last office visit in December 2024 the use of Plaquenil was discussed.  She was given informational handout to review but unfortunately the patient has been under tremendous amount of stress grieving the loss of her son in January 2025.  Patient states that her primary concern has been neuropathy involving both feet.  She states that she also has issues with balance.  Patient states that she has not started physical therapy since she has been grieving the loss of her son.  She has not followed back up with neurology.  Patient's been taking tramadol at bedtime and Tylenol as needed for symptomatic relief. Patient continues to have chronic dry eyes and has occasional sores in her mouth.  She denies any symptoms of Raynaud's phenomenon.  She denies any skin tightness or thickening.   Activities of Daily Living:  Patient reports morning stiffness for 1 hour.   Patient Reports nocturnal pain.  Difficulty dressing/grooming: Denies Difficulty climbing stairs: Reports Difficulty getting out of chair: Reports Difficulty using hands for taps, buttons, cutlery, and/or writing: Reports  Review of Systems  Constitutional:  Positive for fatigue.  HENT:  Positive for mouth sores and nose dryness. Negative for mouth dryness.        Nose sores  Eyes:  Positive for dryness. Negative for pain.  Respiratory:  Negative for shortness of breath and difficulty breathing.   Cardiovascular:  Negative for chest pain and palpitations.  Gastrointestinal:  Positive for blood  in stool and constipation. Negative for diarrhea.  Endocrine: Negative for increased urination.  Genitourinary:  Negative for involuntary urination.  Musculoskeletal:  Positive for joint pain, gait problem, joint pain, joint swelling, myalgias, muscle weakness, morning stiffness, muscle tenderness and myalgias.  Skin:  Positive for hair loss. Negative for color change, rash and sensitivity to sunlight.  Allergic/Immunologic: Negative for susceptible to infections.  Neurological:  Positive for numbness. Negative for dizziness and headaches.  Hematological:  Negative for swollen glands.  Psychiatric/Behavioral:  Negative for depressed mood and sleep disturbance. The patient is not nervous/anxious.     PMFS History:  Patient Active Problem List   Diagnosis Date Noted   Choledocholithiasis with acute cholecystitis with obstruction 11/02/2017   Chronic kidney disease, stage 3 (HCC) 11/02/2017   DVT (deep venous thrombosis) (HCC) 11/12/2015   Pulmonary emboli (HCC) 11/11/2015   Acute pulmonary embolism (HCC) 11/11/2015   Coronary artery calcification seen on CAT scan 05/12/2015   Thoracic aortic aneurysm (HCC) 05/12/2015   Dyspnea 03/17/2015   Left leg swelling 03/16/2015   Acute colitis 10/04/2014   COPD (chronic obstructive pulmonary disease) (HCC)    PVD (peripheral vascular disease) (HCC) 04/01/2012   Leg pain 04/01/2012   Swelling of limb 04/01/2012   History of shingles    Hypertension    Fibroid    Thyroid disease    Anxiety     Past Medical History:  Diagnosis Date   Acute  colitis    Anxiety    B12 deficiency    Bilateral swelling of feet    Constipation    COPD (chronic obstructive pulmonary disease) (HCC)    Depression    DVT (deep venous thrombosis) (HCC) 2014, 2017   Fibroid    Gallbladder problem    Heart valve problem    High cholesterol    History of shingles    Hypertension    IBS (irritable bowel syndrome)    Joint pain    PE (pulmonary thromboembolism)  (HCC) 2017   SOB (shortness of breath)    Thyroid disease    Nodules   Varicose veins    Vitamin D deficiency     Family History  Problem Relation Age of Onset   Crohn's disease Mother    Alzheimer's disease Mother    Hypertension Father    Stroke Father    Sudden death Father    Aneurysm Father    Cancer Sister    Diabetes Sister    Hypertension Sister    Prostate cancer Brother    Stroke Brother    Drug abuse Son    Other Son        overdose in 2019   Other Son        brain mass   Colon cancer Son    Past Surgical History:  Procedure Laterality Date   ABDOMINAL HYSTERECTOMY     CHOLECYSTECTOMY N/A 11/05/2017   Procedure: LAPAROSCOPIC CHOLECYSTECTOMY WITH INTRAOPERATIVE CHOLANGIOGRAM;  Surgeon: Caralyn Chandler, MD;  Location: [facility] OR;  Service: General;  Laterality: N/A;   COLONOSCOPY N/A 10/05/2014   Procedure: COLONOSCOPY;  Surgeon: Lanita Pitman, MD;  Location: [facility] ENDOSCOPY;  Service: Endoscopy;  Laterality: N/A;   ERCP N/A 11/04/2017   Procedure: ENDOSCOPIC RETROGRADE CHOLANGIOPANCREATOGRAPHY (ERCP) Balloon Extraction;  Surgeon: Genell Ken, MD;  Location: [facility] ENDOSCOPY;  Service: Gastroenterology;  Laterality: N/A;   LAPAROSCOPIC CHOLECYSTECTOMY W/ CHOLANGIOGRAPHY  11/05/2017   Dr. Alethea Andes   OOPHORECTOMY     One ovary removed   SPHINCTEROTOMY  11/04/2017   Procedure: SPHINCTEROTOMY;  Surgeon: Genell Ken, MD;  Location: [facility] ENDOSCOPY;  Service: Gastroenterology;;   TOTAL THYROIDECTOMY     Social History   Social History Narrative   Are you right handed or left handed? Left hand   Are you currently employed ?    What is your current occupation? retired   Do you live at home alone? no   Who lives with you? Son    What type of home do you live in: 1 story or 2 story? one    Caffeine 1 cup a day   Immunization History  Administered Date(s) Administered   Influenza Split 02/21/2015   PFIZER(Purple Top)SARS-COV-2 Vaccination 02/23/2020     Objective: Vital Signs: BP  136/87 (BP Location: Left Arm, Patient Position: Sitting, Cuff Size: Normal)   Pulse 71   Resp 13   Ht 5\' 6"  (1.676 m)   Wt 191 lb 9.6 oz (86.9 kg)   BMI 30.93 kg/m    Physical Exam Vitals and nursing note reviewed.  Constitutional:      Appearance: She is well-developed.  HENT:     Head: Normocephalic and atraumatic.  Eyes:     Conjunctiva/sclera: Conjunctivae normal.  Cardiovascular:     Rate and Rhythm: Normal rate and regular rhythm.     Heart sounds: Normal heart sounds.  Pulmonary:     Effort: Pulmonary effort is normal.     Breath sounds:  Normal breath sounds.  Abdominal:     General: Bowel sounds are normal.     Palpations: Abdomen is soft.  Musculoskeletal:     Cervical back: Normal range of motion.  Lymphadenopathy:     Cervical: No cervical adenopathy.  Skin:    General: Skin is warm and dry.     Capillary Refill: Capillary refill takes less than 2 seconds.  Neurological:     Mental Status: She is alert and oriented to person, place, and time.  Psychiatric:        Behavior: Behavior normal.      Musculoskeletal Exam: C-spine has good range of motion.  Shoulder joints, elbow joints, and wrist joints have good range of motion.  CMC, PIP, DIP thickening consistent with osteoarthritis of both hands.  Hip joints have good range of motion with no groin pain.  Knee joints have good range of motion with no warmth or effusion.  Ankle joints have good range of motion with no tenderness or joint swelling.  CDAI Exam: CDAI Score: -- Patient Global: --; Provider Global: -- Swollen: --; Tender: -- Joint Exam 06/20/2023   No joint exam has been documented for this visit   There is currently no information documented on the homunculus. Go to the Rheumatology activity and complete the homunculus joint exam.  Investigation: No additional findings.  Imaging: No results found.  Recent Labs: Lab Results  Component Value Date   WBC 4.3 01/23/2023   HGB 12.3  01/23/2023   PLT 274 01/23/2023   NA 140 01/23/2023   K 4.4 01/23/2023   CL 104 01/23/2023   CO2 30 01/23/2023   GLUCOSE 92 01/23/2023   BUN 26 (H) 01/23/2023   CREATININE 0.98 (H) 01/23/2023   BILITOT 0.5 01/23/2023   ALKPHOS 48 11/02/2020   AST 19 01/23/2023   ALT 15 01/23/2023   PROT 7.2 01/23/2023   ALBUMIN 4.3 11/02/2020   CALCIUM 9.6 01/23/2023   GFRAA 69 03/09/2019    Speciality Comments: No specialty comments available.  Procedures:  No procedures performed Allergies: Penicillins, Sulfa antibiotics, Ace inhibitors, Atorvastatin, Citrus, Oxycodone, Sulfamethoxazole-trimethoprim, and Vicodin [hydrocodone-acetaminophen]   Assessment / Plan:     Visit Diagnoses: MCTD (mixed connective tissue disease) (HCC) - Positive ANA, positive RNP, positive SSA, positive dsDNA, positive RF, elevated sedimentation rate: Patient was last seen in the office on 02/19/2023 at which time there was discussion of initiating a trial of Plaquenil.  At that time the patient decided against initiating Plaquenil but was given an informational handout to review.  She is apprehensive to initiate any medications at this time.  She has been grieving the loss of her son since January 2025 and has not given the use of Plaquenil much thought.  Her primary concern remains neuropathy involving both feet.  She has not yet initiated physical therapy which was recommended by neurology.  Discussed that she may benefit from following back up with neurology to discuss neuropathic pain medications.  She is apprehensive to initiate Plaquenil at this time due to possible side effects.  She had no synovitis on examination today.  She will notify us  if she develops any new or worsening symptoms.  Plan to obtain the following lab work today for further evaluation.  She will follow-up in the office in 6 months or sooner if needed.  - Plan: RNP Antibody, Sjogrens syndrome-A extractable nuclear antibody, Rheumatoid factor, Protein /  creatinine ratio, urine, CBC with Differential/Platelet, Comprehensive metabolic panel with GFR, Sedimentation rate, C3  and C4, ANA, Anti-DNA antibody, double-stranded  Pain in both hands: She has CMC, PIP, DIP thickening consistent with osteoarthritis of both hands.  Overall the discomfort in her hands has been tolerable.  No synovitis was noted on examination today.  Chronic pain of both knees: She has good range of motion of both knee joints on examination today.  No warmth or effusion noted.  Discussed that she will benefit from physical therapy to improve her balance.  Primary osteoarthritis of both hips: X-rays of both hips from 01/23/2023 were consistent with osteoarthritis.  Pain in both feet - X-rays were suggestive of osteoarthritis.  She has no joint tenderness or synovitis on examination today.  Her primary concern remains neuropathy.  Patient will be following back up with neurology.  She will also be starting physical therapy as recommended by neurology to try to improve her balance.  Neuropathy: Patient was evaluated by Dr. Rommie Coats on 02/20/2023 at that time the patient agreed to physical therapy but unfortunately she was unable to initiate physical therapy since she has been grieving the loss of her son since mid January 2025.  The patient's primary concern remains neuropathy involving both feet.  She may benefit from following back up with neurology to discuss neuropathic pain medications to alleviate her symptoms.  Other medical conditions are listed as follows:  Stage 3a chronic kidney disease (HCC)  History of COPD - centrilobular emphysema.  Calcified granuloma of lung - CT angio chest 01/25/22: L3 mm superior segment left lower lobe nodule, unchanged, benign, calcified granulomas  Coronary artery calcification seen on CAT scan  Former smoker - Quit smoking May 12, 2008.  Smoked 1 to 2 pack/day for 50 years.  Pulmonary hypertension (HCC)  Acute deep vein  thrombosis (DVT) of femoral vein of left lower extremity (HCC) - 1610,9604  Pulmonary embolism without acute cor pulmonale, unspecified chronicity, unspecified pulmonary embolism type (HCC)  PVD (peripheral vascular disease) (HCC)  Thoracic aortic aneurysm without rupture, unspecified part (HCC)  Primary hypertension: Blood pressure was 136/87 today in the office.  Choledocholithiasis with acute cholecystitis with obstruction  Thyroid disease  Anxiety and depression  Family history of systemic lupus erythematosus-grandson and niece    Orders: Orders Placed This Encounter  Procedures   RNP Antibody   Sjogrens syndrome-A extractable nuclear antibody   Rheumatoid factor   Protein / creatinine ratio, urine   CBC with Differential/Platelet   Comprehensive metabolic panel with GFR   Sedimentation rate   C3 and C4   ANA   Anti-DNA antibody, double-stranded   No orders of the defined types were placed in this encounter.     Follow-Up Instructions: Return in about 6 months (around 12/20/2023).   Romayne Clubs, PA-C  Note - This record has been created using Dragon software.  Chart creation errors have been sought, but may not always  have been located. Such creation errors do not reflect on  the standard of medical care.

## 2023-06-20 ENCOUNTER — Encounter: Payer: Self-pay | Admitting: Physician Assistant

## 2023-06-20 ENCOUNTER — Ambulatory Visit: Payer: Medicare HMO | Attending: Physician Assistant | Admitting: Physician Assistant

## 2023-06-20 VITALS — BP 136/87 | HR 71 | Resp 13 | Ht 66.0 in | Wt 191.6 lb

## 2023-06-20 DIAGNOSIS — I2510 Atherosclerotic heart disease of native coronary artery without angina pectoris: Secondary | ICD-10-CM

## 2023-06-20 DIAGNOSIS — M79671 Pain in right foot: Secondary | ICD-10-CM

## 2023-06-20 DIAGNOSIS — M160 Bilateral primary osteoarthritis of hip: Secondary | ICD-10-CM

## 2023-06-20 DIAGNOSIS — J984 Other disorders of lung: Secondary | ICD-10-CM

## 2023-06-20 DIAGNOSIS — E079 Disorder of thyroid, unspecified: Secondary | ICD-10-CM

## 2023-06-20 DIAGNOSIS — M79641 Pain in right hand: Secondary | ICD-10-CM | POA: Diagnosis not present

## 2023-06-20 DIAGNOSIS — I739 Peripheral vascular disease, unspecified: Secondary | ICD-10-CM

## 2023-06-20 DIAGNOSIS — I2699 Other pulmonary embolism without acute cor pulmonale: Secondary | ICD-10-CM

## 2023-06-20 DIAGNOSIS — I272 Pulmonary hypertension, unspecified: Secondary | ICD-10-CM

## 2023-06-20 DIAGNOSIS — K8043 Calculus of bile duct with acute cholecystitis with obstruction: Secondary | ICD-10-CM

## 2023-06-20 DIAGNOSIS — F419 Anxiety disorder, unspecified: Secondary | ICD-10-CM

## 2023-06-20 DIAGNOSIS — F32A Depression, unspecified: Secondary | ICD-10-CM

## 2023-06-20 DIAGNOSIS — N1831 Chronic kidney disease, stage 3a: Secondary | ICD-10-CM

## 2023-06-20 DIAGNOSIS — I82412 Acute embolism and thrombosis of left femoral vein: Secondary | ICD-10-CM

## 2023-06-20 DIAGNOSIS — M351 Other overlap syndromes: Secondary | ICD-10-CM

## 2023-06-20 DIAGNOSIS — G629 Polyneuropathy, unspecified: Secondary | ICD-10-CM

## 2023-06-20 DIAGNOSIS — Z8709 Personal history of other diseases of the respiratory system: Secondary | ICD-10-CM

## 2023-06-20 DIAGNOSIS — G8929 Other chronic pain: Secondary | ICD-10-CM

## 2023-06-20 DIAGNOSIS — I10 Essential (primary) hypertension: Secondary | ICD-10-CM

## 2023-06-20 DIAGNOSIS — M25561 Pain in right knee: Secondary | ICD-10-CM

## 2023-06-20 DIAGNOSIS — Z8269 Family history of other diseases of the musculoskeletal system and connective tissue: Secondary | ICD-10-CM

## 2023-06-20 DIAGNOSIS — M79642 Pain in left hand: Secondary | ICD-10-CM

## 2023-06-20 DIAGNOSIS — M25562 Pain in left knee: Secondary | ICD-10-CM

## 2023-06-20 DIAGNOSIS — M25551 Pain in right hip: Secondary | ICD-10-CM

## 2023-06-20 DIAGNOSIS — I712 Thoracic aortic aneurysm, without rupture, unspecified: Secondary | ICD-10-CM

## 2023-06-20 DIAGNOSIS — M79672 Pain in left foot: Secondary | ICD-10-CM

## 2023-06-20 DIAGNOSIS — Z87891 Personal history of nicotine dependence: Secondary | ICD-10-CM

## 2023-06-21 NOTE — Progress Notes (Signed)
 ESR remains slightly elevated but stable.  CBC and CMP WNL Urine protein creatinine ratio WNL RF is now negative

## 2023-06-21 NOTE — Progress Notes (Signed)
Complements WNL

## 2023-06-22 LAB — CBC WITH DIFFERENTIAL/PLATELET
Absolute Lymphocytes: 1728 {cells}/uL (ref 850–3900)
Absolute Monocytes: 389 {cells}/uL (ref 200–950)
Basophils Absolute: 29 {cells}/uL (ref 0–200)
Basophils Relative: 0.5 %
Eosinophils Absolute: 226 {cells}/uL (ref 15–500)
Eosinophils Relative: 3.9 %
HCT: 37.8 % (ref 35.0–45.0)
Hemoglobin: 12.5 g/dL (ref 11.7–15.5)
MCH: 31.3 pg (ref 27.0–33.0)
MCHC: 33.1 g/dL (ref 32.0–36.0)
MCV: 94.5 fL (ref 80.0–100.0)
MPV: 10.8 fL (ref 7.5–12.5)
Monocytes Relative: 6.7 %
Neutro Abs: 3428 {cells}/uL (ref 1500–7800)
Neutrophils Relative %: 59.1 %
Platelets: 236 10*3/uL (ref 140–400)
RBC: 4 10*6/uL (ref 3.80–5.10)
RDW: 13.8 % (ref 11.0–15.0)
Total Lymphocyte: 29.8 %
WBC: 5.8 10*3/uL (ref 3.8–10.8)

## 2023-06-22 LAB — COMPREHENSIVE METABOLIC PANEL WITH GFR
AG Ratio: 1.2 (calc) (ref 1.0–2.5)
ALT: 11 U/L (ref 6–29)
AST: 15 U/L (ref 10–35)
Albumin: 3.8 g/dL (ref 3.6–5.1)
Alkaline phosphatase (APISO): 48 U/L (ref 37–153)
BUN: 21 mg/dL (ref 7–25)
CO2: 31 mmol/L (ref 20–32)
Calcium: 9.3 mg/dL (ref 8.6–10.4)
Chloride: 105 mmol/L (ref 98–110)
Creat: 0.92 mg/dL (ref 0.60–0.95)
Globulin: 3.2 g/dL (ref 1.9–3.7)
Glucose, Bld: 84 mg/dL (ref 65–99)
Potassium: 4.1 mmol/L (ref 3.5–5.3)
Sodium: 141 mmol/L (ref 135–146)
Total Bilirubin: 0.5 mg/dL (ref 0.2–1.2)
Total Protein: 7 g/dL (ref 6.1–8.1)
eGFR: 61 mL/min/{1.73_m2} (ref >=60)

## 2023-06-22 LAB — ANTI-NUCLEAR AB-TITER (ANA TITER): ANA Titer 1: 1:1280 {titer} — ABNORMAL HIGH

## 2023-06-22 LAB — C3 AND C4
C3 Complement: 146 mg/dL
C4 Complement: 15 mg/dL

## 2023-06-22 LAB — PROTEIN / CREATININE RATIO, URINE
Creatinine, Urine: 156 mg/dL (ref 20–275)
Protein/Creat Ratio: 115 mg/g{creat} (ref 24–184)
Protein/Creatinine Ratio: 0.115 mg/mg{creat} (ref 0.024–0.184)
Total Protein, Urine: 18 mg/dL (ref 5–24)

## 2023-06-22 LAB — ANA: Anti Nuclear Antibody (ANA): POSITIVE — AB

## 2023-06-22 LAB — RHEUMATOID FACTOR: Rheumatoid fact SerPl-aCnc: 13 [IU]/mL (ref <14)

## 2023-06-22 LAB — SEDIMENTATION RATE: Sed Rate: 39 mm/h — ABNORMAL HIGH (ref 0–30)

## 2023-06-22 LAB — RNP ANTIBODY: Ribonucleic Protein(ENA) Antibody, IgG: >8 AI — AB

## 2023-06-22 LAB — SJOGRENS SYNDROME-A EXTRACTABLE NUCLEAR ANTIBODY: SSA (Ro) (ENA) Antibody, IgG: >8 AI — AB

## 2023-06-22 LAB — ANTI-DNA ANTIBODY, DOUBLE-STRANDED: ds DNA Ab: 7 [IU]/mL — ABNORMAL HIGH

## 2023-06-23 NOTE — Progress Notes (Signed)
 RNP remains positive.  Ro antibody remains positive.   ANA remains positive  dsDNA is within indeterminate range-7.

## 2023-08-15 NOTE — Progress Notes (Signed)
 NEUROLOGY FOLLOW UP OFFICE NOTE  Denise Cline [id number]  Subjective:  Denise Cline is a 83 y.o. 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  To briefly review: 02/20/23: Patient has had symptoms for 5-10 years. She describes pain, numbness, tingling, and burning in her legs and hands. It started very slowly and has been slowly progressive. She now has difficulty walking with imbalance. She denies any falls, but has near falls requiring her to catch herself. She does not use an assistive device to help her walk. She takes tramadol  and tylenol  for pain. She has not previously tried gabapentin and Lyrica. She has had electric stimulation therapy previously, which may have helped for a short period of time. She does not remember ever having an EMG.   Patient has occasional tremor in her hands and mouth. These has been going on about 6 months. She thinks it is at rest and intermittent. She thinks it is worse with stress. It does not last long. Her handwriting is worse, but not small. She thinks her smell may not be as good as normal. She denies clear freezing while walking. She is not aware of clear REM sleep disorder, but she does wake up with all the covers removed.   She has not had recent PT. She mentions that she is supposed to start PT at the beginning of the year. This has been ordered by PCP.   Patient is followed in rheumatology by Dr. Alvira Josephs for mixed connective tissue disease (last seen 02/19/23). Per that note: Visit Diagnoses: MCTD (mixed connective tissue disease) (HCC) - Positive ANA, positive RNP, positive SSA, positive dsDNA, positive RF, elevated sedimentation rate: I did detailed discussion with the patient regarding the lab  results.  Complements are normal and sed rate is mildly elevated.  Signs dry mouth she does not have any other symptoms of autoimmune disease.  She complains of joint pain but no synovitis was noted.  She has osteoarthritis involving multiple joints.  I discussed possible use of hydroxychloroquine.  Side effects of hydroxychloroquine were discussed at length.  Patient states she is uncertain if she wants to take any medications for mixed connective tissue disease as she does not have much symptoms.  I provided her with a handout to review.  I also advised her to contact us  if she develops any new symptoms.   Pain in both hands -she complains of discomfort in her bilateral hands.  No synovitis was noted.  Bilateral CMC PIP and DIP thickening was noted.  X-rays obtained at the last visit of bilateral hands were suggestive of osteoarthritis.  X-ray findings were reviewed with the patient.  A handout on hand exercises was provided.   Chronic pain of both knees -she complains of discomfort in the bilateral knee joints.  No warmth swelling or effusion was noted.  X-rays obtained at the last visit showed bilateral knee joint moderate osteoarthritis.  Detail counseled regarding osteoarthritis was provided.  A handout on lower extremity exercises was given.   Pain in right hip -she complains of right hip joint.  X-rays obtained at the last visit showed osteoarthritic changes.  Results were reviewed with the patient.   Pain in both feet-she complains of discomfort in her feet.  No synovitis was noted.  X-rays were suggestive of osteoarthritis.   She does not report any constitutional symptoms like fever or unintentional weight loss.   EtOH use: None currently, but did many years ago  Restrictive diet? She does not eat often for many years (maybe 1 meal and an ensure each day) Family history of neuropathy/myopathy/NM disease? Mother with Alzheimer  Most recent Assessment and Plan (02/20/23): Denise Cline  is a 83 y.o. female who presents for evaluation of numbness and tingling in legs and hands, imbalance, and tremor. 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 Her neurological examination is pertinent for diminished sensation in bilateral lower extremities in a length dependent fashion and wide based, unsteady gait. Available diagnostic data is significant for HbA1c of 6.4, positive ANA, SSA, dsDNA, RF. MRI brain in 2020 showed age related generalized atrophy.   Patient's symptoms are likely multifactorial with contributions from arthritis, PVD, lower extremity edema, and peripheral neuropathy. 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 This is likely a major contributor to her imbalance. Her known risk factors include mixed connective tissue disease (sensory neuronopathy associated with Sjogren's and SSA positivity), pre-DM and possible prior heavy EtOH use, though this was many years ago. I will send labs to look for other treatable causes. The etiology of patient's tremor is unclear as I did not see a tremor today. Of note, patient has no current evidence of parkinsonism (no bradykinesia, increased tone in extremities). I will closely monitor this though.   PLAN: -Blood work: B1, B12, IFE -Agree with PT - has already been ordered by PCP. Patient to let me or PCP know if this does not start as planned -Lidocaine  cream PRN -Discussed neuropathic pain medications, but patient deferred for now  Since their last visit: Patient has lost a lot of family, including her son. She stopped taking all of her medications due to some argument that she had with him. She has not told anyone about stopping her medications.  She has had one fall since our last visit. She felt like her left leg gave out. She did not hurt herself.  She is having  numbness, tingling, and pain in legs and pain.  She has not done PT.   MEDICATIONS:  Outpatient Encounter Medications as of 08/23/2023  Medication Sig   acetaminophen  (TYLENOL ) 650 MG CR tablet Take 1,300 mg by mouth 2 (two) times a day. (Patient not taking: Reported on 08/23/2023)   aspirin  EC 81 MG tablet Take 81 mg by mouth daily. Swallow whole. (Patient not taking: Reported on 08/23/2023)   Cholecalciferol (VITAMIN D3) 125 MCG (5000 UT) CAPS Take by mouth. (Patient not taking: Reported on 08/23/2023)   Colesevelam HCl 3.75 g PACK Take by mouth. (Patient not taking: Reported on 08/23/2023)   hydrochlorothiazide  (HYDRODIURIL ) 25 MG tablet Take 25 mg by mouth daily.  (Patient not taking: Reported on 08/23/2023)   lidocaine  (LIDODERM ) 5 % Place 1 patch onto the skin daily. Remove & Discard patch within 12 hours or as directed by MD (Patient not taking: Reported on 08/23/2023)   losartan  (COZAAR ) 100 MG tablet Take 100 mg by mouth daily. (Patient not taking: Reported on 08/23/2023)   Magnesium  250 MG TABS Take by mouth. (Patient not taking: Reported on 08/23/2023)   Multiple Vitamins-Minerals (CENTRUM SILVER ADULT 50+) TABS Take 1 tablet by mouth daily. (Patient not taking: Reported on 08/23/2023)  pantoprazole  (PROTONIX ) 40 MG tablet TAKE ONE TABLET BY MOUTH EVERY EVENING (Patient not taking: Reported on 08/23/2023)   polyethylene glycol (MIRALAX  / GLYCOLAX ) packet Take 17 g by mouth daily. (Patient not taking: Reported on 08/23/2023)   rivaroxaban  (XARELTO ) 20 MG TABS tablet Take 20 mg by mouth daily with supper. (Patient not taking: Reported on 08/23/2023)   rosuvastatin  (CRESTOR ) 20 MG tablet TAKE ONE TABLET BY MOUTH EVERY EVENING (Patient not taking: Reported on 08/23/2023)   saccharomyces boulardii (FLORASTOR) 250 MG capsule Take 250 mg by mouth 2 (two) times daily. (Patient not taking: Reported on 08/23/2023)   sertraline (ZOLOFT) 50 MG tablet  (Patient not taking: Reported on 08/23/2023)   traMADol   (ULTRAM ) 50 MG tablet Take 1 tablet (50 mg total) by mouth every 6 (six) hours as needed for moderate pain. (Patient not taking: Reported on 08/23/2023)   traZODone (DESYREL) 50 MG tablet Take 50 mg by mouth at bedtime. (Patient not taking: Reported on 08/23/2023)   triamcinolone  (KENALOG ) 0.025 % cream Apply 1 application topically daily as needed for itching. (Patient not taking: Reported on 08/23/2023)   Vitamin D , Ergocalciferol , (DRISDOL ) 1.25 MG (50000 UNIT) CAPS capsule TAKE ONE CAPSULE BY MOUTH ONCE WEEKLY ON MONDAY (Patient not taking: Reported on 08/23/2023)   No facility-administered encounter medications on file as of 08/23/2023.    PAST MEDICAL HISTORY: Past Medical History:  Diagnosis Date   Acute colitis    Anxiety    B12 deficiency    Bilateral swelling of feet    Constipation    COPD (chronic obstructive pulmonary disease) (HCC)    Depression    DVT (deep venous thrombosis) (HCC) 2014, 2017   Fibroid    Gallbladder problem    Heart valve problem    High cholesterol    History of shingles    Hypertension    IBS (irritable bowel syndrome)    Joint pain    PE (pulmonary thromboembolism) (HCC) 2017   SOB (shortness of breath)    Thyroid  disease    Nodules   Varicose veins    Vitamin D  deficiency     PAST SURGICAL HISTORY: Past Surgical History:  Procedure Laterality Date   ABDOMINAL HYSTERECTOMY     CHOLECYSTECTOMY N/A 11/05/2017   Procedure: LAPAROSCOPIC CHOLECYSTECTOMY WITH INTRAOPERATIVE CHOLANGIOGRAM;  Surgeon: Caralyn Chandler, MD;  Location: [facility] OR;  Service: General;  Laterality: N/A;   COLONOSCOPY N/A 10/05/2014   Procedure: COLONOSCOPY;  Surgeon: Lanita Pitman, MD;  Location: [facility] ENDOSCOPY;  Service: Endoscopy;  Laterality: N/A;   ERCP N/A 11/04/2017   Procedure: ENDOSCOPIC RETROGRADE CHOLANGIOPANCREATOGRAPHY (ERCP) Balloon Extraction;  Surgeon: Genell Ken, MD;  Location: [facility] ENDOSCOPY;  Service: Gastroenterology;  Laterality: N/A;   LAPAROSCOPIC CHOLECYSTECTOMY  W/ CHOLANGIOGRAPHY  11/05/2017   Dr. Alethea Andes   OOPHORECTOMY     One ovary removed   SPHINCTEROTOMY  11/04/2017   Procedure: SPHINCTEROTOMY;  Surgeon: Genell Ken, MD;  Location: [facility] ENDOSCOPY;  Service: Gastroenterology;;   TOTAL THYROIDECTOMY      ALLERGIES: Allergies  Allergen Reactions   Penicillins Anaphylaxis    Has patient had a PCN reaction causing immediate rash, facial/tongue/throat swelling, SOB or lightheadedness with hypotension: yes Has patient had a PCN reaction causing severe rash involving mucus membranes or skin necrosis: no Has patient had a PCN reaction that required hospitalization yes Has patient had a PCN reaction occurring within the last 10 years: no If all of the above answers are NO, then may proceed with Cephalosporin use.  Sulfa  Antibiotics Hives   Ace Inhibitors Other (See Comments)   Atorvastatin Other (See Comments)   Citrus Itching and Other (See Comments)    Can only eat if taking Benadryl    Oxycodone Other (See Comments)    Hallucinations    Sulfamethoxazole -Trimethoprim  Other (See Comments)   Vicodin [Hydrocodone-Acetaminophen ] Other (See Comments)    I don't know where I am    FAMILY HISTORY: Family History  Problem Relation Age of Onset   Crohn's disease Mother    Alzheimer's disease Mother    Hypertension Father    Stroke Father    Sudden death Father    Aneurysm Father    Cancer Sister    Diabetes Sister    Hypertension Sister    Prostate cancer Brother    Stroke Brother    Drug abuse Son    Other Son        overdose in 2019   Other Son        brain mass   Colon cancer Son     SOCIAL HISTORY: Social History   Tobacco Use   Smoking status: Former    Current packs/day: 0.00    Average packs/day: 2.0 packs/day for 40.0 years (80.0 ttl pk-yrs)    Types: Cigarettes    Start date: 05/30/1968    Quit date: 05/30/2008    Years since quitting: 15.2    Passive exposure: Current (minimal)   Smokeless tobacco: Former    Quit  date: 05/12/2008  Vaping Use   Vaping status: Never Used  Substance Use Topics   Alcohol  use: Yes    Comment: Rare   Drug use: No   Social History   Social History Narrative   Are you right handed or left handed? Left hand   Are you currently employed ?    What is your current occupation? retired   Do you live at home alone? no   Who lives with you? Son    What type of home do you live in: 1 story or 2 story? one    Caffeine 1 cup a day      Objective:  Vital Signs:  BP 124/86   Pulse 89   Ht 5' 6 (1.676 m)   Wt 186 lb (84.4 kg)   SpO2 97%   BMI 30.02 kg/m   General: Patient tearful throughout the history and exam with flat affect. Head:  Normocephalic/atraumatic Neck: supple Lungs: Non-labored breathing on room air   Neurological Exam: Mental status: alert and oriented, speech fluent and not dysarthric, language intact.  Cranial nerves: CN I: not tested CN II: pupils equal, round and reactive to light, visual fields intact CN III, IV, VI:  full range of motion, no nystagmus, no ptosis CN V: facial sensation intact. CN VII: upper and lower face symmetric CN VIII: hearing intact CN IX, X: uvula midline CN XI: sternocleidomastoid and trapezius muscles intact CN XII: tongue midline  Bulk & Tone: normal, no fasciculations. Motor:  muscle strength 5/5 throughout Deep Tendon Reflexes:  2+ throughout, except at ankles which are absent.  Sensation:  Pinprick sensation diminished below the knees in bilateral lower extremities. Intact in upper extremities Finger to nose testing:  Without dysmetria.    Gait:  Wide based gait, mildly unsteady.   Labs and Imaging review: New results: 02/20/23: B1 wnl B12: 608 IFE: no M protein  06/20/23: RNP ab positive SSA positive ESR 39 ANA positive (1:1280) dsDNA indeterminate  Previously reviewed results: 01/23/23: ANA positive (  1:1280) DsDNA positive at 5 SSA > 8 positive SSB negative Anti-Sm negative RNP  positive > 8.0 Scl-70 negative CCP negative RF positive ESR elevated at 36 CK 173 (130 13 years ago)   External labs: 01/01/23: CMP significant for Cr 1.07 CBC unremarkable SSA positive, SSB negative ANA positive (1:80) RF positive CRP wnl HbA1c: 6.4   Imaging: CT head wo contrast (08/31/22): FINDINGS: Brain: no acute territorial infarction, hemorrhage, or intracranial mass. Ventricles are nonenlarged. Mild atrophy. Mild to moderate white matter hypodensity consistent with chronic small vessel ischemic change.   Vascular: Hyperdense vessels.  Carotid vascular calcification   Skull: Normal. Negative for fracture or focal lesion.   Sinuses/Orbits: Mucosal thickening in the sinuses   Other: None   IMPRESSION: 1. No CT evidence for acute intracranial abnormality. 2. Atrophy and chronic small vessel ischemic changes of the white matter.   CTA head and neck (08/25/21): CTA NECK FINDINGS   SKELETON: There is no bony spinal canal stenosis. No lytic or blastic lesion.   OTHER NECK: Right thyroid  lobe is enlarged, possibly compensatory to the atrophic or absent left lobe.   UPPER CHEST: No pneumothorax or pleural effusion. No nodules or masses.   AORTIC ARCH:   There is no calcific atherosclerosis of the aortic arch. There is no aneurysm, dissection or hemodynamically significant stenosis of the visualized portion of the aorta. Conventional 3 vessel aortic branching pattern. The visualized proximal subclavian arteries are widely patent.   RIGHT CAROTID SYSTEM: No dissection, occlusion or aneurysm. Mild atherosclerotic calcification at the carotid bifurcation without hemodynamically significant stenosis.   LEFT CAROTID SYSTEM: Normal without aneurysm, dissection or stenosis.   VERTEBRAL ARTERIES: Left dominant configuration. Both origins are clearly patent. There is no dissection, occlusion or flow-limiting stenosis to the skull base (V1-V3 segments).   CTA  HEAD FINDINGS   POSTERIOR CIRCULATION:   --Vertebral arteries: Normal V4 segments.   --Inferior cerebellar arteries: Normal.   --Basilar artery: Normal.   --Superior cerebellar arteries: Normal.   --Posterior cerebral arteries (PCA): Normal.   ANTERIOR CIRCULATION:   --Intracranial internal carotid arteries: Normal.   --Anterior cerebral arteries (ACA): Normal. Both A1 segments are present. Patent anterior communicating artery (a-comm).   --Middle cerebral arteries (MCA): Normal.   VENOUS SINUSES: As permitted by contrast timing, patent.   ANATOMIC VARIANTS: None   Review of the MIP images confirms the above findings.   IMPRESSION: 1. No emergent large vessel occlusion or hemodynamically significant stenosis of the head or neck. 2. Chronic ischemic microangiopathy.   Cervical spine xray (08/24/21): FINDINGS: There is straightening of normal cervical lordosis. There is no evidence of cervical spine fracture or prevertebral soft tissue swelling. Alignment is normal. There is mild disc space narrowing and endplate osteophyte formation at C4-C5 and C5-C6 compatible with degenerative change.   IMPRESSION: 1. No evidence for fracture or malalignment. 2. Mild to moderate degenerative changes at C4-C5 and C5-C6.   MRI brain w/wo contrast (07/30/2018): IMPRESSION: MRI HEAD IMPRESSION:   1. No acute intracranial infarct or other abnormality identified. 2. Generalized age-related cerebral atrophy with mild chronic small vessel ischemic disease. 3. Empty sella.  Assessment/Plan:  This is Bucky Cardinal, a 83 y.o. female with: Numbness and tingling in hands and feet, imbalance, falls. Patient's symptoms are likely multifactorial with contributions from arthritis, PVD, lower extremity edema, and peripheral neuropathy. 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 This is likely a major contributor to her imbalance. Her known risk factors include  mixed  connective tissue disease (sensory neuronopathy associated with Sjogren's and SSA positivity), pre-DM and possible prior heavy EtOH use, though this was many years ago. She was to do PT after last visit, but never scheduled it. She has had 1 fall since 02/20/23. Depression - this was the major issue discussed today. Patient appears very distraught about recent deaths in her family and arguments with family. Per patient, she has stopped all of her medications and is no longer taking care of herself. She denies SI. She would like help with her mental health.  Plan: -Patient promised to make appointment with PCP to discuss medications as she states she has stopped all of them -Physical therapy for imbalance and falls -Lidocaine  cream as needed -Patient not interested in taking medications at this time for neuropathy -Referral to behavioral health  Return to clinic in 6 months  Total time spent reviewing records, interview, history/exam, documentation, and coordination of care on day of encounter:  30 min  Rommie Coats, MD

## 2023-08-23 ENCOUNTER — Encounter: Payer: Self-pay | Admitting: Neurology

## 2023-08-23 ENCOUNTER — Ambulatory Visit: Payer: Medicare HMO | Admitting: Neurology

## 2023-08-23 VITALS — BP 124/86 | HR 89 | Ht 66.0 in | Wt 186.0 lb

## 2023-08-23 DIAGNOSIS — G629 Polyneuropathy, unspecified: Secondary | ICD-10-CM | POA: Diagnosis not present

## 2023-08-23 DIAGNOSIS — I739 Peripheral vascular disease, unspecified: Secondary | ICD-10-CM

## 2023-08-23 DIAGNOSIS — R2689 Other abnormalities of gait and mobility: Secondary | ICD-10-CM

## 2023-08-23 DIAGNOSIS — F32A Depression, unspecified: Secondary | ICD-10-CM

## 2023-08-23 DIAGNOSIS — Z91148 Patient's other noncompliance with medication regimen for other reason: Secondary | ICD-10-CM

## 2023-08-23 DIAGNOSIS — R296 Repeated falls: Secondary | ICD-10-CM

## 2023-08-23 DIAGNOSIS — Z659 Problem related to unspecified psychosocial circumstances: Secondary | ICD-10-CM

## 2023-08-23 DIAGNOSIS — M351 Other overlap syndromes: Secondary | ICD-10-CM | POA: Diagnosis not present

## 2023-08-23 NOTE — Patient Instructions (Addendum)
 I am referring you to physical therapy to help with imbalance and falls.  You can also try Lidocaine  cream as needed. Apply wear you have pain, tingling, or burning. Wear gloves to prevent your hands being numb. This can be bought over the counter at any drug store or online.  You promised me you would make an appointment with your primary care doctor to discuss stopping all your medications.  I am also referring you to behavioral health for depression.  The physicians and staff at [facility] are committed to providing excellent care. You may receive a survey requesting feedback about your experience at our office. We strive to receive very good responses to the survey questions. If you feel that your experience would prevent you from giving the office a very good  response, please contact our office to try to remedy the situation. We may be reached at [phone]. Thank you for taking the time out of your busy day to complete the survey.  Rommie Coats, MD [facility]  Preventing Falls at [facility] are common, often dreaded events in the lives of older people. Aside from the obvious injuries and even death that may result, fall can cause wide-ranging consequences including loss of independence, mental decline, decreased activity and mobility. Younger people are also at risk of falling, especially those with chronic illnesses and fatigue.  Ways to reduce risk for falling Examine diet and medications. Warm foods and alcohol  dilate blood vessels, which can lead to dizziness when standing. Sleep aids, antidepressants and pain medications can also increase the likelihood of a fall.  Get a vision exam. Poor vision, cataracts and glaucoma increase the chances of falling.  Check foot gear. Shoes should fit snugly and have a sturdy, nonskid sole and a broad, low heel  Participate in a physician-approved exercise program to build and maintain muscle strength and improve balance and  coordination. Programs that use ankle weights or stretch bands are excellent for muscle-strengthening. Water  aerobics programs and low-impact Tai Chi programs have also been shown to improve balance and coordination.  Increase vitamin D  intake. Vitamin D  improves muscle strength and increases the amount of calcium  the body is able to absorb and deposit in bones.  How to prevent falls from common hazards Floors - Remove all loose wires, cords, and throw rugs. Minimize clutter. Make sure rugs are anchored and smooth. Keep furniture in its usual place.  Chairs -- Use chairs with straight backs, armrests and firm seats. Add firm cushions to existing pieces to add height.  Bathroom - Install grab bars and non-skid tape in the tub or shower. Use a bathtub transfer bench or a shower chair with a back support Use an elevated toilet seat and/or safety rails to assist standing from a low surface. Do not use towel racks or bathroom tissue holders to help you stand.  Lighting - Make sure halls, stairways, and entrances are well-lit. Install a night light in your bathroom or hallway. Make sure there is a light switch at the top and bottom of the staircase. Turn lights on if you get up in the middle of the night. Make sure lamps or light switches are within reach of the bed if you have to get up during the night.  Kitchen - Install non-skid rubber mats near the sink and stove. Clean spills immediately. Store frequently used utensils, pots, pans between waist and eye level. This helps prevent reaching and bending. Sit when getting things out of lower cupboards.  Living room/ Bedrooms - Place furniture with wide spaces in between, giving enough room to move around. Establish a route through the living room that gives you something to hold onto as you walk.  Stairs - Make sure treads, rails, and rugs are secure. Install a rail on both sides of the stairs. If stairs are a threat, it might be helpful to arrange most  of your activities on the lower level to reduce the number of times you must climb the stairs.  Entrances and doorways - Install metal handles on the walls adjacent to the doorknobs of all doors to make it more secure as you travel through the doorway.  Tips for maintaining balance Keep at least one hand free at all times. Try using a backpack or fanny pack to hold things rather than carrying them in your hands. Never carry objects in both hands when walking as this interferes with keeping your balance.  Attempt to swing both arms from front to back while walking. This might require a conscious effort if Parkinson's disease has diminished your movement. It will, however, help you to maintain balance and posture, and reduce fatigue.  Consciously lift your feet off of the ground when walking. Shuffling and dragging of the feet is a common culprit in losing your balance.  When trying to navigate turns, use a U technique of facing forward and making a wide turn, rather than pivoting sharply.  Try to stand with your feet shoulder-length apart. When your feet are close together for any length of time, you increase your risk of losing your balance and falling.  Do one thing at a time. Don't try to walk and accomplish another task, such as reading or looking around. The decrease in your automatic reflexes complicates motor function, so the less distraction, the better.  Do not wear rubber or gripping soled shoes, they might catch on the floor and cause tripping.  Move slowly when changing positions. Use deliberate, concentrated movements and, if needed, use a grab bar or walking aid. Count 15 seconds between each movement. For example, when rising from a seated position, wait 15 seconds after standing to begin walking.  If balance is a continuous problem, you might want to consider a walking aid such as a cane, walking stick, or walker. Once you've mastered walking with help, you might be ready to  try it on your own again.

## 2023-09-09 ENCOUNTER — Encounter: Payer: Self-pay | Admitting: Physical Therapy

## 2023-09-09 ENCOUNTER — Ambulatory Visit: Attending: Neurology | Admitting: Physical Therapy

## 2023-09-09 VITALS — BP 122/84 | HR 65

## 2023-09-09 DIAGNOSIS — R2681 Unsteadiness on feet: Secondary | ICD-10-CM | POA: Insufficient documentation

## 2023-09-09 DIAGNOSIS — Z9181 History of falling: Secondary | ICD-10-CM | POA: Insufficient documentation

## 2023-09-09 DIAGNOSIS — R2689 Other abnormalities of gait and mobility: Secondary | ICD-10-CM | POA: Diagnosis present

## 2023-09-09 DIAGNOSIS — M6281 Muscle weakness (generalized): Secondary | ICD-10-CM | POA: Diagnosis present

## 2023-09-09 DIAGNOSIS — R296 Repeated falls: Secondary | ICD-10-CM | POA: Diagnosis not present

## 2023-09-09 NOTE — Therapy (Signed)
 OUTPATIENT PHYSICAL THERAPY NEURO EVALUATION   Patient Name: Denise Cline MRN: [id number] DOB:[birth year], 83 y.o., female Today's Date: 09/09/2023   PCP: Campbell Reynolds, NP REFERRING PROVIDER: Leigh Venetia LITTIE, MD  END OF SESSION:  PT End of Session - 09/09/23 0853     Visit Number 1    Number of Visits 13   Plus eval   Date for PT Re-Evaluation 10/28/23    Authorization Type [employer] Medicare    PT Start Time 0849    PT Stop Time 0933    PT Time Calculation (min) 44 min    Activity Tolerance Patient tolerated treatment well    Behavior During Therapy Lability;WFL for tasks assessed/performed          Past Medical History:  Diagnosis Date   Acute colitis    Anxiety    B12 deficiency    Bilateral swelling of feet    Constipation    COPD (chronic obstructive pulmonary disease) (HCC)    Depression    DVT (deep venous thrombosis) (HCC) 2014, 2017   Fibroid    Gallbladder problem    Heart valve problem    High cholesterol    History of shingles    Hypertension    IBS (irritable bowel syndrome)    Joint pain    PE (pulmonary thromboembolism) (HCC) 2017   SOB (shortness of breath)    Thyroid  disease    Nodules   Varicose veins    Vitamin D  deficiency    Past Surgical History:  Procedure Laterality Date   ABDOMINAL HYSTERECTOMY     CHOLECYSTECTOMY N/A 11/05/2017   Procedure: LAPAROSCOPIC CHOLECYSTECTOMY WITH INTRAOPERATIVE CHOLANGIOGRAM;  Surgeon: Curvin Deward MOULD, MD;  Location[facility];  Service: General;  Laterality: N/A;   COLONOSCOPY N/A 10/05/2014   Procedure: COLONOSCOPY;  Surgeon: Lamar Bunk, MD;  Location: [facility] ENDOSCOPY;  Service: Endoscopy;  Laterality: N/A;   ERCP N/A 11/04/2017   Procedure: ENDOSCOPIC RETROGRADE CHOLANGIOPANCREATOGRAPHY (ERCP) Balloon Extraction;  Surgeon: Saintclair Jasper, MD;  Location: [facility] ENDOSCOPY;  Service: Gastroenterology;  Laterality: N/A;   LAPAROSCOPIC CHOLECYSTECTOMY W/ CHOLANGIOGRAPHY  11/05/2017   Dr. Curvin   OOPHORECTOMY     One ovary  removed   SPHINCTEROTOMY  11/04/2017   Procedure: SPHINCTEROTOMY;  Surgeon: Saintclair Jasper, MD;  Location: [facility] ENDOSCOPY;  Service: Gastroenterology;;   TOTAL THYROIDECTOMY     Patient Active Problem List   Diagnosis Date Noted   Choledocholithiasis with acute cholecystitis with obstruction 11/02/2017   Chronic kidney disease, stage 3 (HCC) 11/02/2017   DVT (deep venous thrombosis) (HCC) 11/12/2015   Pulmonary emboli (HCC) 11/11/2015   Acute pulmonary embolism (HCC) 11/11/2015   Coronary artery calcification seen on CAT scan 05/12/2015   Thoracic aortic aneurysm (HCC) 05/12/2015   Dyspnea 03/17/2015   Left leg swelling 03/16/2015   Acute colitis 10/04/2014   COPD (chronic obstructive pulmonary disease) (HCC)    PVD (peripheral vascular disease) (HCC) 04/01/2012   Leg pain 04/01/2012   Swelling of limb 04/01/2012   History of shingles    Hypertension    Fibroid    Thyroid  disease    Anxiety     ONSET DATE: 08/23/2023 (referral)   REFERRING DIAG: R26.89 (ICD-10-CM) - Imbalance R29.6 (ICD-10-CM) - Falls  THERAPY DIAG:  No diagnosis found.  Rationale for Evaluation and Treatment: Rehabilitation  SUBJECTIVE:  SUBJECTIVE STATEMENT: Pt presents alone without AD. Pt emotional regarding why she stopped taking her medications and recalled story of how her son died. Did make an appointment w/PCP on 7/21 to review her medications, as she stopped taking them in December of 2024. States she lives alone and takes care of her late son's dog. Reports her neighbors want her to get rid of the dog, but he gives me a reason to move. Had a fall about 2 months ago in her house and does not know why she fell. States she fell straight down and did not injure herself, was able to get up on her own. Purchased a cane that  folds up but has not used it yet.     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 Yes: NPRS scale: 4-5/10 Pain location: Bilateral feet  Pain description: Tingly, achy, they don't feel like they belong to me Aggravating factors: none  Relieving factors: Tylenol   PRECAUTIONS: Fall  RED FLAGS: None   WEIGHT BEARING RESTRICTIONS: No  FALLS: Has patient fallen in last 6 months? Yes. Number of falls 1  LIVING ENVIRONMENT: Lives with: lives alone Lives in: House/apartment Stairs: Yes: External: 3 steps; on right going up, on left going up, and can reach both Has following equipment at home: Single point cane, shower chair, Grab bars, and has to put shower chair and grab bars together, no installed currently   PLOF: Independent  PATIENT GOALS: I need my balance back and to be able to walk better    OBJECTIVE:  Note: Objective measures were completed at Evaluation unless otherwise noted.  DIAGNOSTIC FINDINGS: CT of head from 08/2022  IMPRESSION: 1. No CT evidence for acute intracranial abnormality. 2. Atrophy and chronic small vessel ischemic changes of the white matter.  COGNITION: Overall cognitive status: Difficulty to assess due to: no family present   SENSATION: Pt reports impaired sensation in bilateral feet and hands    EDEMA: Chronic swelling in LLE - history of DVT in 2019     POSTURE: rounded shoulders, forward head, and increased thoracic kyphosis  LOWER EXTREMITY ROM:     Active  Right Eval Left Eval  Hip flexion    Hip extension    Hip abduction    Hip adduction    Hip internal rotation    Hip external rotation    Knee flexion    Knee extension    Ankle dorsiflexion    Ankle plantarflexion    Ankle inversion    Ankle eversion     (Blank  rows = not tested)  LOWER EXTREMITY MMT:  Tested in seated position   MMT Right Eval Left Eval  Hip flexion 3+ 3+  Hip extension    Hip abduction 4 4  Hip adduction 3+ 3+  Hip internal rotation    Hip external rotation    Knee flexion 4- 4-  Knee extension 4- 4-  Ankle dorsiflexion 4 4  Ankle plantarflexion    Ankle inversion    Ankle eversion    (Blank rows = not tested)  BED MOBILITY:  Not tested  TRANSFERS: Sit to stand: SBA  Assistive device utilized: None     Stand to sit: SBA  Assistive device utilized: None      RAMP:  Not tested  CURB:  Not  tested  STAIRS: Not tested GAIT: Gait pattern: step through pattern, decreased arm swing- Right, decreased arm swing- Left, decreased stride length, decreased hip/knee flexion- Right, decreased hip/knee flexion- Left, lateral hip instability, decreased trunk rotation, wide BOS, and poor foot clearance- Left Distance walked: Various clinic distances  Assistive device utilized: None Level of assistance: SBA Comments: Pt ambulates w/wide BOS and waddle-like pattern. Decreased cadence. No LOB noted    FUNCTIONAL TESTS:   [facility] PT Assessment - 09/09/23 0926       Transfers   Five time sit to stand comments  30.16s   BUE support, posterior bracing against mat          VITALS  Vitals:   09/09/23 0907  BP: 122/84  Pulse: 65                                                                                                                                 TREATMENT:   Self-care/home management  Reiterated education from Dr. Leigh on importance of discussing medications w/PCP. Pt still grieving loss of her son and required frequent redirection to answer therapist's questions about her own care and not talk about her family.  Assessed vitals (see above) and WNL  Educated pt on polyneuropathy and connective tissue disease, as pt reports she does not know what either of these mean and no one has told her. Reviewed her most  recent after visit summaries w/Dr. Leigh and rheumatologist and reminded pt of their recommendations. Pt reports she does not remember much but will go back and read their notes.   PATIENT EDUCATION: Education details: POC, eval findings, see self-care section  Person educated: Patient Education method: Explanation and Demonstration Education comprehension: verbalized understanding and needs further education  HOME EXERCISE PROGRAM: To be established   GOALS: Goals reviewed with patient? Yes  SHORT TERM GOALS: Target date: 10/07/2023   Pt will be independent with initial HEP for improved strength, balance, transfers and gait.  Baseline: not established on eval  Goal status: INITIAL  2.  Pt will improve 5 x STS to less than or equal to 25 seconds to demonstrate improved functional strength and transfer efficiency.   Baseline: 30.16s w/BUE support  Goal status: INITIAL  3.  to be assessed and STG/LTG updated  Baseline:  Goal status: INITIAL  4.  Pt will trial various AD to determine safest option for pt for reduced fall risk  Baseline: pt has SPC that she has not used  Goal status: INITIAL  5.  FGA to be assessed and LTG updated  Baseline:  Goal status: INITIAL  LONG TERM GOALS: Target date: 10/21/2023   Pt will be independent with final HEP for improved strength, balance, transfers and gait.  Baseline:  Goal status: INITIAL  2.  Pt will improve 5 x STS to less than or equal to 20 seconds to demonstrate improved functional strength and transfer efficiency.   Baseline:  30.16s w/BUE support Goal status: INITIAL  3.  goal  Baseline:  Goal status: INITIAL  4.  FGA goal  Baseline:  Goal status: INITIAL  5.  Pt will perform floor transfer mod I for safe fall recovery and proper body mechanics  Baseline:  Goal status: INITIAL   ASSESSMENT:  CLINICAL IMPRESSION: Patient is a 83 year old female referred to Neuro OPPT for falls. Pt's PMH is significant  for: HTN, HLD, CHF, pre-DM, mixed connective tissue disease, DVT and PE (on Xarelto ), pancreatitis, COPD, former smoker, gallbladder removal, CKD, depression. The following deficits were present during the exam: global deconditioning, decreased safety awareness, impaired balance, impaired sensation and edema. Based on 5x STS and falls history, pt is an incr risk for falls. Pt would benefit from skilled PT to address these impairments and functional limitations to maximize functional mobility independence.    OBJECTIVE IMPAIRMENTS: Abnormal gait, decreased activity tolerance, decreased balance, decreased cognition, decreased endurance, decreased knowledge of condition, decreased knowledge of use of DME, decreased mobility, difficulty walking, decreased strength, decreased safety awareness, increased edema, impaired sensation, improper body mechanics, and pain  ACTIVITY LIMITATIONS: carrying, lifting, bending, standing, squatting, stairs, transfers, hygiene/grooming, locomotion level, and caring for others  PARTICIPATION LIMITATIONS: meal prep, cleaning, laundry, medication management, interpersonal relationship, driving, shopping, community activity, and yard work  PERSONAL FACTORS: Age, Fitness, Past/current experiences, Transportation, and 1-2 comorbidities: mixed connective tissue disease and polyneuropathy are also affecting patient's functional outcome.   REHAB POTENTIAL: Good  CLINICAL DECISION MAKING: Evolving/moderate complexity  EVALUATION COMPLEXITY: Moderate  PLAN:  PT FREQUENCY: 2x/week  PT DURATION: 6 weeks  PLANNED INTERVENTIONS: [id number]- PT Re-evaluation, 97750- Physical Performance Testing, 97110-Therapeutic exercises, 97530- Therapeutic activity, [id number]- Neuromuscular re-education, 97535- Self Care, [id number]- Manual therapy, [id number]- Gait training, [id number]- Canalith repositioning, [id number]- Aquatic Therapy, [phone]- Electrical stimulation (manual), [phone] (1-2 muscles), [id number] (3+ muscles)- Dry  Needling, Patient/Family education, Balance training, Stair training, Joint mobilization, Spinal mobilization, Vestibular training, and DME instructions  PLAN FOR NEXT SESSION: , FGA and update goals. Trial a SPC and rollator. Establish HEP for BLE and hip strength (sit to stands, hip 4-way, march overs, bridges). Scifit for endurance.    Bentzion Dauria E Laurence Crofford, PT, DPT 09/09/2023, 9:34 AM

## 2023-09-13 ENCOUNTER — Encounter: Payer: Self-pay | Admitting: Neurology

## 2023-09-16 ENCOUNTER — Ambulatory Visit: Admitting: Physical Therapy

## 2023-09-16 VITALS — BP 151/84 | HR 65

## 2023-09-16 DIAGNOSIS — R2681 Unsteadiness on feet: Secondary | ICD-10-CM

## 2023-09-16 DIAGNOSIS — M6281 Muscle weakness (generalized): Secondary | ICD-10-CM

## 2023-09-16 DIAGNOSIS — R2689 Other abnormalities of gait and mobility: Secondary | ICD-10-CM

## 2023-09-16 NOTE — Therapy (Signed)
 OUTPATIENT PHYSICAL THERAPY NEURO TREATMENT   Patient Name: Denise Cline MRN: [id number] DOB:16-Mar-1940, 83 y.o., female Today's Date: 09/16/2023   PCP: Campbell Reynolds, NP REFERRING PROVIDER: Leigh Venetia LITTIE, MD  END OF SESSION:  PT End of Session - 09/16/23 1100     Visit Number 2    Number of Visits 13   Plus eval   Date for PT Re-Evaluation 10/28/23    Authorization Type [employer] Medicare    PT Start Time 1100    PT Stop Time 1145    PT Time Calculation (min) 45 min    Activity Tolerance Patient tolerated treatment well    Behavior During Therapy WFL for tasks assessed/performed           Past Medical History:  Diagnosis Date   Acute colitis    Anxiety    B12 deficiency    Bilateral swelling of feet    Constipation    COPD (chronic obstructive pulmonary disease) (HCC)    Depression    DVT (deep venous thrombosis) (HCC) 2014, 2017   Fibroid    Gallbladder problem    Heart valve problem    High cholesterol    History of shingles    Hypertension    IBS (irritable bowel syndrome)    Joint pain    PE (pulmonary thromboembolism) (HCC) 2017   SOB (shortness of breath)    Thyroid  disease    Nodules   Varicose veins    Vitamin D  deficiency    Past Surgical History:  Procedure Laterality Date   ABDOMINAL HYSTERECTOMY     CHOLECYSTECTOMY N/A 11/05/2017   Procedure: LAPAROSCOPIC CHOLECYSTECTOMY WITH INTRAOPERATIVE CHOLANGIOGRAM;  Surgeon: Curvin Deward MOULD, MD;  Location: [facility] OR;  Service: General;  Laterality: N/A;   COLONOSCOPY N/A 10/05/2014   Procedure: COLONOSCOPY;  Surgeon: Lamar Bunk, MD;  Location: [facility] ENDOSCOPY;  Service: Endoscopy;  Laterality: N/A;   ERCP N/A 11/04/2017   Procedure: ENDOSCOPIC RETROGRADE CHOLANGIOPANCREATOGRAPHY (ERCP) Balloon Extraction;  Surgeon: Saintclair Jasper, MD;  Location: [facility] ENDOSCOPY;  Service: Gastroenterology;  Laterality: N/A;   LAPAROSCOPIC CHOLECYSTECTOMY W/ CHOLANGIOGRAPHY  11/05/2017   Dr. Curvin   OOPHORECTOMY     One ovary  removed   SPHINCTEROTOMY  11/04/2017   Procedure: SPHINCTEROTOMY;  Surgeon: Saintclair Jasper, MD;  Location: [facility] ENDOSCOPY;  Service: Gastroenterology;;   TOTAL THYROIDECTOMY     Patient Active Problem List   Diagnosis Date Noted   Choledocholithiasis with acute cholecystitis with obstruction 11/02/2017   Chronic kidney disease, stage 3 (HCC) 11/02/2017   DVT (deep venous thrombosis) (HCC) 11/12/2015   Pulmonary emboli (HCC) 11/11/2015   Acute pulmonary embolism (HCC) 11/11/2015   Coronary artery calcification seen on CAT scan 05/12/2015   Thoracic aortic aneurysm (HCC) 05/12/2015   Dyspnea 03/17/2015   Left leg swelling 03/16/2015   Acute colitis 10/04/2014   COPD (chronic obstructive pulmonary disease) (HCC)    PVD (peripheral vascular disease) (HCC) 04/01/2012   Leg pain 04/01/2012   Swelling of limb 04/01/2012   History of shingles    Hypertension    Fibroid    Thyroid  disease    Anxiety     ONSET DATE: 08/23/2023 (referral)   REFERRING DIAG: R26.89 (ICD-10-CM) - Imbalance R29.6 (ICD-10-CM) - Falls  THERAPY DIAG:  Unsteadiness on feet  Other abnormalities of gait and mobility  Muscle weakness (generalized)  Rationale for Evaluation and Treatment: Rehabilitation  SUBJECTIVE:  SUBJECTIVE STATEMENT: Pt presents alone without AD. Wearing flip flops. Reports she had a long weekend, feels like her feet do not belong to me. No falls.     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 Yes: NPRS scale: 5/10 Pain location: Bilateral feet  Pain description: Tingly, achy, they don't feel like they  belong to me Aggravating factors: none  Relieving factors: Tylenol   PRECAUTIONS: Fall  RED FLAGS: None   WEIGHT BEARING RESTRICTIONS: No  FALLS: Has patient fallen in last 6 months? Yes. Number of falls 1  LIVING ENVIRONMENT: Lives with: lives alone Lives in: House/apartment Stairs: Yes: External: 3 steps; on right going up, on left going up, and can reach both Has following equipment at home: Single point cane, shower chair, Grab bars, and has to put shower chair and grab bars together, no installed currently   PLOF: Independent  PATIENT GOALS: I need my balance back and to be able to walk better    OBJECTIVE:  Note: Objective measures were completed at Evaluation unless otherwise noted.  DIAGNOSTIC FINDINGS: CT of head from 08/2022  IMPRESSION: 1. No CT evidence for acute intracranial abnormality. 2. Atrophy and chronic small vessel ischemic changes of the white matter.  COGNITION: Overall cognitive status: Difficulty to assess due to: no family present   SENSATION: Pt reports impaired sensation in bilateral feet and hands    EDEMA: Chronic swelling in LLE - history of DVT in 2019     POSTURE: rounded shoulders, forward head, and increased thoracic kyphosis  LOWER EXTREMITY ROM:     Active  Right Eval Left Eval  Hip flexion    Hip extension    Hip abduction    Hip adduction    Hip internal rotation    Hip external rotation    Knee flexion    Knee extension    Ankle dorsiflexion    Ankle plantarflexion    Ankle inversion    Ankle eversion     (Blank rows = not tested)  LOWER EXTREMITY MMT:  Tested in seated position   MMT Right Eval Left Eval  Hip flexion 3+ 3+  Hip extension    Hip abduction 4 4  Hip adduction 3+ 3+  Hip internal rotation    Hip external rotation    Knee flexion 4- 4-  Knee extension 4- 4-  Ankle dorsiflexion 4 4  Ankle plantarflexion    Ankle inversion    Ankle eversion    (Blank rows = not tested)  BED  MOBILITY:  Not tested  TRANSFERS: Sit to stand: SBA  Assistive device utilized: None     Stand to sit: SBA  Assistive device utilized: None      RAMP:  Not tested  CURB:  Not tested  STAIRS: Not tested GAIT: Gait pattern: step through pattern, decreased arm swing- Right, decreased arm swing- Left, decreased stride length, decreased hip/knee flexion- Right, decreased hip/knee flexion- Left, lateral hip instability, decreased trunk rotation, wide BOS, and poor foot clearance- Left Distance walked: Various clinic distances  Assistive device utilized: None Level of assistance: SBA Comments: Pt ambulates w/wide BOS and waddle-like pattern. Decreased cadence. No LOB noted    FUNCTIONAL TESTS:      VITALS  Vitals:   09/16/23 1119  BP: (!) 151/84  Pulse: 65  TREATMENT:   Self-care/home management  Pt inquiring about polyneuropathy, so continued to provide education on thi condition and informed pt it is permanent. Encouraged pt to wear close-toe shoes at all times to protect her feet and reduce her fall risk. Pt did not receive education well, stating You are crazy and this girl has lost her mind. Pt reports she [place] and this is her preference. Continued to educate on importance of checking her feet and wearing protective shoes, which pt did verbalize understanding.  Assessed vitals (see above) and systolic elevated but within limits for session. Per pt request, wrote this value down on sticky note for pt to have for her records.  Noted pt's toenails overgrown and black, so encouraged her to seek professional care for nail maintenance. Pt reports she has an appointment at a spa on Wednesday to trim her nails and knows she needs to do something for her toenails, but that is low on the list.  Provided therapeutic  listening as pt discussing the deaths of her mom, sons and sister and the impact this has had on her. Pt reports she has not been taking care of herself and is finally to the point where she is ready to do so. Encouraged pt to seek out professional counseling and pt states she has started this process but has not completed it, will do so by Friday.   PATIENT EDUCATION: Education details: See above  Person educated: Patient Education method: Medical illustrator Education comprehension: verbalized understanding and needs further education  HOME EXERCISE PROGRAM: To be established   GOALS: Goals reviewed with patient? Yes  SHORT TERM GOALS: Target date: 10/07/2023   Pt will be independent with initial HEP for improved strength, balance, transfers and gait.  Baseline: not established on eval  Goal status: INITIAL  2.  Pt will improve 5 x STS to less than or equal to 25 seconds to demonstrate improved functional strength and transfer efficiency.   Baseline: 30.16s w/BUE support  Goal status: INITIAL  3.  to be assessed and STG/LTG updated  Baseline:  Goal status: INITIAL  4.  Pt will trial various AD to determine safest option for pt for reduced fall risk  Baseline: pt has SPC that she has not used  Goal status: INITIAL  5.  FGA to be assessed and LTG updated  Baseline:  Goal status: INITIAL  LONG TERM GOALS: Target date: 10/21/2023   Pt will be independent with final HEP for improved strength, balance, transfers and gait.  Baseline:  Goal status: INITIAL  2.  Pt will improve 5 x STS to less than or equal to 20 seconds to demonstrate improved functional strength and transfer efficiency.   Baseline: 30.16s w/BUE support Goal status: INITIAL  3.  goal  Baseline:  Goal status: INITIAL  4.  FGA goal  Baseline:  Goal status: INITIAL  5.  Pt will perform floor transfer mod I for safe fall recovery and proper body mechanics  Baseline:  Goal status:  INITIAL   ASSESSMENT:  CLINICAL IMPRESSION: Emphasis of skilled PT session on pt education regarding neuropathy and proper footwear as well as providing therapeutic listening as pt discusses her mental health. Pt resistant to make changes to her current ADLs, you cannot teach an old dog new tricks, so provided thorough education on importance of proper footwear for fall and wound prevention, as pt tends to walk around barefoot. Pt removed her shoes upon entry  to clinic today as well. Pt continues to be limited by significant grief and depression and does have resources for counseling, but has not completed paperwork for this. Encouraged pt to complete this, as mental and physical health are closely related. Continue POC.    OBJECTIVE IMPAIRMENTS: Abnormal gait, decreased activity tolerance, decreased balance, decreased cognition, decreased endurance, decreased knowledge of condition, decreased knowledge of use of DME, decreased mobility, difficulty walking, decreased strength, decreased safety awareness, increased edema, impaired sensation, improper body mechanics, and pain  ACTIVITY LIMITATIONS: carrying, lifting, bending, standing, squatting, stairs, transfers, hygiene/grooming, locomotion level, and caring for others  PARTICIPATION LIMITATIONS: meal prep, cleaning, laundry, medication management, interpersonal relationship, driving, shopping, community activity, and yard work  PERSONAL FACTORS: Age, Fitness, Past/current experiences, Transportation, and 1-2 comorbidities: mixed connective tissue disease and polyneuropathy are also affecting patient's functional outcome.   REHAB POTENTIAL: Good  CLINICAL DECISION MAKING: Evolving/moderate complexity  EVALUATION COMPLEXITY: Moderate  PLAN:  PT FREQUENCY: 2x/week  PT DURATION: 6 weeks  PLANNED INTERVENTIONS: 97164- PT Re-evaluation, 97750- Physical Performance Testing, 97110-Therapeutic exercises, 97530- Therapeutic activity, [id number]-  Neuromuscular re-education, 97535- Self Care, [id number]- Manual therapy, [id number]- Gait training, [phone]- Canalith repositioning, [id number]- Aquatic Therapy, [phone]- Electrical stimulation (manual), [phone] (1-2 muscles), [id number] (3+ muscles)- Dry Needling, Patient/Family education, Balance training, Stair training, Joint mobilization, Spinal mobilization, Vestibular training, and DME instructions  PLAN FOR NEXT SESSION: , FGA and update goals. Trial a SPC and rollator. Establish HEP for BLE and hip strength (sit to stands, hip 4-way, march overs, bridges). Scifit for endurance. Did she finish paperwork?    Chasmine Lender E Aldridge Krzyzanowski, PT, DPT 09/16/2023, 11:49 AM

## 2023-09-20 ENCOUNTER — Ambulatory Visit: Admitting: Physical Therapy

## 2023-09-20 VITALS — BP 138/92 | HR 78

## 2023-09-20 DIAGNOSIS — M6281 Muscle weakness (generalized): Secondary | ICD-10-CM

## 2023-09-20 DIAGNOSIS — R2689 Other abnormalities of gait and mobility: Secondary | ICD-10-CM

## 2023-09-20 DIAGNOSIS — R2681 Unsteadiness on feet: Secondary | ICD-10-CM | POA: Diagnosis not present

## 2023-09-20 NOTE — Therapy (Signed)
 OUTPATIENT PHYSICAL THERAPY NEURO TREATMENT   Patient Name: Denise Cline MRN: [id number] DOB:[birth year], 83 y.o., female Today's Date: 09/20/2023   PCP: Campbell Reynolds, NP REFERRING PROVIDER: Leigh Venetia LITTIE, MD  END OF SESSION:  PT End of Session - 09/20/23 1102     Visit Number 3    Number of Visits 13   Plus eval   Date for PT Re-Evaluation 10/28/23    Authorization Type [employer] Medicare    Authorization Time Period 09/09/23 - 10/21/23 12 PT visits    PT Start Time 1101    PT Stop Time 1148    PT Time Calculation (min) 47 min    Activity Tolerance Patient tolerated treatment well    Behavior During Therapy WFL for tasks assessed/performed            Past Medical History:  Diagnosis Date   Acute colitis    Anxiety    B12 deficiency    Bilateral swelling of feet    Constipation    COPD (chronic obstructive pulmonary disease) (HCC)    Depression    DVT (deep venous thrombosis) (HCC) 2014, 2017   Fibroid    Gallbladder problem    Heart valve problem    High cholesterol    History of shingles    Hypertension    IBS (irritable bowel syndrome)    Joint pain    PE (pulmonary thromboembolism) (HCC) 2017   SOB (shortness of breath)    Thyroid  disease    Nodules   Varicose veins    Vitamin D  deficiency    Past Surgical History:  Procedure Laterality Date   ABDOMINAL HYSTERECTOMY     CHOLECYSTECTOMY N/A 11/05/2017   Procedure: LAPAROSCOPIC CHOLECYSTECTOMY WITH INTRAOPERATIVE CHOLANGIOGRAM;  Surgeon: Curvin Deward MOULD, MD;  Location: [facility] OR;  Service: General;  Laterality: N/A;   COLONOSCOPY N/A 10/05/2014   Procedure: COLONOSCOPY;  Surgeon: Lamar Bunk, MD;  Location: [facility] ENDOSCOPY;  Service: Endoscopy;  Laterality: N/A;   ERCP N/A 11/04/2017   Procedure: ENDOSCOPIC RETROGRADE CHOLANGIOPANCREATOGRAPHY (ERCP) Balloon Extraction;  Surgeon: Saintclair Jasper, MD;  Location: [facility] ENDOSCOPY;  Service: Gastroenterology;  Laterality: N/A;   LAPAROSCOPIC CHOLECYSTECTOMY W/ CHOLANGIOGRAPHY   11/05/2017   Dr. Curvin   OOPHORECTOMY     One ovary removed   SPHINCTEROTOMY  11/04/2017   Procedure: SPHINCTEROTOMY;  Surgeon: Saintclair Jasper, MD;  Location: [facility] ENDOSCOPY;  Service: Gastroenterology;;   TOTAL THYROIDECTOMY     Patient Active Problem List   Diagnosis Date Noted   Choledocholithiasis with acute cholecystitis with obstruction 11/02/2017   Chronic kidney disease, stage 3 (HCC) 11/02/2017   DVT (deep venous thrombosis) (HCC) 11/12/2015   Pulmonary emboli (HCC) 11/11/2015   Acute pulmonary embolism (HCC) 11/11/2015   Coronary artery calcification seen on CAT scan 05/12/2015   Thoracic aortic aneurysm (HCC) 05/12/2015   Dyspnea 03/17/2015   Left leg swelling 03/16/2015   Acute colitis 10/04/2014   COPD (chronic obstructive pulmonary disease) (HCC)    PVD (peripheral vascular disease) (HCC) 04/01/2012   Leg pain 04/01/2012   Swelling of limb 04/01/2012   History of shingles    Hypertension    Fibroid    Thyroid  disease    Anxiety     ONSET DATE: 08/23/2023 (referral)   REFERRING DIAG: R26.89 (ICD-10-CM) - Imbalance R29.6 (ICD-10-CM) - Falls  THERAPY DIAG:  Unsteadiness on feet  Other abnormalities of gait and mobility  Muscle weakness (generalized)  Rationale for Evaluation and Treatment: Rehabilitation  SUBJECTIVE:  SUBJECTIVE STATEMENT: Pt presents alone without AD. Wearing tennis shoes today. States her hands are very crampy today, they are moving in all different directions. Reports she sees her PCP Monday at 11am, supposed to have PT until 11am. Refuses to cancel her PT appointment and told her PCP she would be late. No falls.     Pt accompanied by: self (pt is driving)   PERTINENT HISTORY: HTN, HLD, CHF, pre-DM, mixed connective tissue disease, DVT and PE (on Xarelto ),  pancreatitis, COPD, former smoker, gallbladder removal, CKD, depression  PAIN:  Are you having pain? No  PRECAUTIONS: Fall  RED FLAGS: None   WEIGHT BEARING RESTRICTIONS: No  FALLS: Has patient fallen in last 6 months? Yes. Number of falls 1  LIVING ENVIRONMENT: Lives with: lives alone Lives in: House/apartment Stairs: Yes: External: 3 steps; on right going up, on left going up, and can reach both Has following equipment at home: Single point cane, shower chair, Grab bars, and has to put shower chair and grab bars together, no installed currently   PLOF: Independent  PATIENT GOALS: I need my balance back and to be able to walk better    OBJECTIVE:  Note: Objective measures were completed at Evaluation unless otherwise noted.  DIAGNOSTIC FINDINGS: CT of head from 08/2022  IMPRESSION: 1. No CT evidence for acute intracranial abnormality. 2. Atrophy and chronic small vessel ischemic changes of the white matter.  COGNITION: Overall cognitive status: Difficulty to assess due to: no family present   SENSATION: Pt reports impaired sensation in bilateral feet and hands    EDEMA: Chronic swelling in LLE - history of DVT in 2019     POSTURE: rounded shoulders, forward head, and increased thoracic kyphosis  LOWER EXTREMITY ROM:     Active  Right Eval Left Eval  Hip flexion    Hip extension    Hip abduction    Hip adduction    Hip internal rotation    Hip external rotation    Knee flexion    Knee extension    Ankle dorsiflexion    Ankle plantarflexion    Ankle inversion    Ankle eversion     (Blank rows = not tested)  LOWER EXTREMITY MMT:  Tested in seated position   MMT Right Eval Left Eval  Hip flexion 3+ 3+  Hip extension    Hip abduction 4 4  Hip adduction 3+ 3+  Hip internal rotation    Hip external rotation    Knee flexion 4- 4-  Knee extension 4- 4-  Ankle dorsiflexion 4 4  Ankle plantarflexion    Ankle inversion    Ankle eversion     (Blank rows = not tested)  BED MOBILITY:  Not tested  TRANSFERS: Sit to stand: SBA  Assistive device utilized: None     Stand to sit: SBA  Assistive device utilized: None      RAMP:  Not tested  CURB:  Not tested  STAIRS: Not tested GAIT: Gait pattern: step through pattern, decreased arm swing- Right, decreased arm swing- Left, decreased stride length, decreased hip/knee flexion- Right, decreased hip/knee flexion- Left, lateral hip instability, decreased trunk rotation, wide BOS, and poor foot clearance- Left Distance walked: Various clinic distances  Assistive device utilized: None Level of assistance: SBA Comments: Pt ambulates w/wide BOS and waddle-like pattern. Decreased cadence. No LOB noted    FUNCTIONAL TESTS:   [facility] PT Assessment - 09/20/23 1126       Balance   Balance Assessed  Yes      Standardized Balance Assessment   Standardized Balance Assessment 10 meter walk test    10 Meter Walk 0.91 m/s   58m over 11s no AD           VITALS  Vitals:   09/20/23 1106 09/20/23 1109  BP: (!) 157/95 (!) 138/92  Pulse: 82 78                                                                                                                                  TREATMENT:   Self-care/home management  Assessed vitals (see above) and BP elevated. Pt reports she is stressed this morning and lost her keys this morning, so thinks this is why. Reassessed after several minutes of seated rest and BP did reduce, WNL.  Encouraged pt to cancel PT Monday to ensure she makes her PCP appointment, as they will not see her if she is >5 minutes late. Pt refusing, stating she told the PCP office she would be late and they are gonna see her regardless. Pt finally agreeable to move PT appointment to Tuesday, the 22nd, in order to make her appointment w/PCP on the 21st. Provided pt w/updated appointment calendar.   Ther Act  SciFit multi-peaks level 2.5 for 8 minutes using BUE/BLEs for neural  priming for reciprocal movement, dynamic cardiovascular warmup and increased amplitude of stepping. RPE of 5/10 following activity.    [facility] PT Assessment - 09/20/23 1126       Balance   Balance Assessed Yes      Standardized Balance Assessment   Standardized Balance Assessment 10 meter walk test    10 Meter Walk 0.91 m/s   62m over 11s no AD        Established initial HEP for improved functional hip strength, LE coordination and BLE strength:  Seated march overs using 10# KB, x10 reps per side. Pt able to self-correct when she was stepping around KB rather than over it.    PATIENT EDUCATION: Education details: See self-care section, initial HEP  Person educated: Patient Education method: Explanation, Demonstration, and Handouts Education comprehension: verbalized understanding, returned demonstration, and needs further education  HOME EXERCISE PROGRAM: Access Code: [email or web address] URL: [email or web address] Date: 09/20/2023 Prepared by: Marlon Indigo Barbian  Exercises - Seated march over  - 1 x daily - 7 x weekly - 3 sets - 10-15 reps  GOALS: Goals reviewed with patient? Yes  SHORT TERM GOALS: Target date: 10/07/2023   Pt will be independent with initial HEP for improved strength, balance, transfers and gait.  Baseline: not established on eval  Goal status: INITIAL  2.  Pt will improve 5 x STS to less than or equal to 25 seconds to demonstrate improved functional strength and transfer efficiency.   Baseline: 30.16s w/BUE support  Goal status: INITIAL  3.  Pt will improve gait velocity to at least 1.0 m/s for improved gait efficiency and independence  Baseline: 0.91 m/s no AD  Goal status: REVISED  4.  Pt will trial various AD to determine safest option for pt for reduced fall risk  Baseline: pt has SPC that she has not used  Goal status: INITIAL  5.  FGA to be assessed and LTG updated  Baseline:  Goal status: INITIAL  LONG TERM GOALS: Target date:  10/21/2023   Pt will be independent with final HEP for improved strength, balance, transfers and gait.  Baseline:  Goal status: INITIAL  2.  Pt will improve 5 x STS to less than or equal to 20 seconds to demonstrate improved functional strength and transfer efficiency.   Baseline: 30.16s w/BUE support Goal status: INITIAL  3.  goal  Baseline: 0.91 m/s no AD (7/18) Goal status: INITIAL  4.  FGA goal  Baseline:  Goal status: INITIAL  5.  Pt will perform floor transfer mod I for safe fall recovery and proper body mechanics  Baseline:  Goal status: INITIAL   ASSESSMENT:  CLINICAL IMPRESSION: Emphasis of skilled PT session on assessing gait speed, establishing initial HEP and providing pt education regarding importance of keeping her PCP appointment. Pt's BP elevated at beginning of session but did reduce w/seated rest and therapeutic listening as pt is very stressed about taking care of sister-in-law. Pt informed therapist of appointment conflict Monday and required max encouragement for pt to avoid missing her PCP appointment, w/PT rescheduled to Tuesday. Pt has not followed up about CT to abdomen, referral to urology and GI that was placed last year, so strongly encouraged her to prioritize PCP appointment on Monday. Established initial HEP that pt can perform at home in [place] or at sister-in-law's house in [place], as pt travels between the two. Also encouraged pt to drop off completed forms at [facility] to set up therapy today after PT session. Continue POC.    OBJECTIVE IMPAIRMENTS: Abnormal gait, decreased activity tolerance, decreased balance, decreased cognition, decreased endurance, decreased knowledge of condition, decreased knowledge of use of DME, decreased mobility, difficulty walking, decreased strength, decreased safety awareness, increased edema, impaired sensation, improper body mechanics, and pain  ACTIVITY LIMITATIONS: carrying, lifting, bending,  standing, squatting, stairs, transfers, hygiene/grooming, locomotion level, and caring for others  PARTICIPATION LIMITATIONS: meal prep, cleaning, laundry, medication management, interpersonal relationship, driving, shopping, community activity, and yard work  PERSONAL FACTORS: Age, Fitness, Past/current experiences, Transportation, and 1-2 comorbidities: mixed connective tissue disease and polyneuropathy are also affecting patient's functional outcome.   REHAB POTENTIAL: Good  CLINICAL DECISION MAKING: Evolving/moderate complexity  EVALUATION COMPLEXITY: Moderate  PLAN:  PT FREQUENCY: 2x/week  PT DURATION: 6 weeks  PLANNED INTERVENTIONS: [id number]- PT Re-evaluation, 97750- Physical Performance Testing, 97110-Therapeutic exercises, 97530- Therapeutic activity, [id number]- Neuromuscular re-education, 97535- Self Care, [id number]- Manual therapy, [id number]- Gait training, [phone]- Canalith repositioning, [id number]- Aquatic Therapy, [phone]- Electrical stimulation (manual), [phone] (1-2 muscles), [id number] (3+ muscles)- Dry Needling, Patient/Family education, Balance training, Stair training, Joint mobilization, Spinal mobilization, Vestibular training, and DME instructions  PLAN FOR NEXT SESSION: FGA and update goals. Trial a SPC and rollator. Establish HEP for BLE and hip strength (sit to stands, hip 4-way, march overs, bridges). Scifit for endurance. Did she bring paperwork to [facility]? How was PCP?   Lonnel Gjerde E Marquasha Brutus, PT, DPT 09/20/2023, 11:53 AM

## 2023-09-23 ENCOUNTER — Ambulatory Visit: Admitting: Physical Therapy

## 2023-09-24 ENCOUNTER — Ambulatory Visit: Payer: Self-pay | Admitting: Physical Therapy

## 2023-09-24 VITALS — BP 152/87 | HR 68

## 2023-09-24 DIAGNOSIS — R2681 Unsteadiness on feet: Secondary | ICD-10-CM | POA: Diagnosis not present

## 2023-09-24 DIAGNOSIS — R2689 Other abnormalities of gait and mobility: Secondary | ICD-10-CM

## 2023-09-24 DIAGNOSIS — M6281 Muscle weakness (generalized): Secondary | ICD-10-CM

## 2023-09-24 NOTE — Therapy (Signed)
 OUTPATIENT PHYSICAL THERAPY NEURO TREATMENT   Patient Name: Denise Cline MRN: [id number] DOB:30-Mar-1940, 83 y.o., female Today's Date: 09/24/2023   PCP: Campbell Reynolds, NP REFERRING PROVIDER: Leigh Venetia LITTIE, MD  END OF SESSION:  PT End of Session - 09/24/23 0938     Visit Number 4    Number of Visits 13   Plus eval   Date for PT Re-Evaluation 10/28/23    Authorization Type [employer] Medicare    Authorization Time Period 09/09/23 - 10/21/23 12 PT visits    PT Start Time 0934    PT Stop Time 1018    PT Time Calculation (min) 44 min    Activity Tolerance Patient tolerated treatment well    Behavior During Therapy [facility] for tasks assessed/performed            Past Medical History:  Diagnosis Date   Acute colitis    Anxiety    B12 deficiency    Bilateral swelling of feet    Constipation    COPD (chronic obstructive pulmonary disease) (HCC)    Depression    DVT (deep venous thrombosis) (HCC) 2014, 2017   Fibroid    Gallbladder problem    Heart valve problem    High cholesterol    History of shingles    Hypertension    IBS (irritable bowel syndrome)    Joint pain    PE (pulmonary thromboembolism) (HCC) 2017   SOB (shortness of breath)    Thyroid  disease    Nodules   Varicose veins    Vitamin D  deficiency    Past Surgical History:  Procedure Laterality Date   ABDOMINAL HYSTERECTOMY     CHOLECYSTECTOMY N/A 11/05/2017   Procedure: LAPAROSCOPIC CHOLECYSTECTOMY WITH INTRAOPERATIVE CHOLANGIOGRAM;  Surgeon: Curvin Deward MOULD, MD;  Location: [facility] OR;  Service: General;  Laterality: N/A;   COLONOSCOPY N/A 10/05/2014   Procedure: COLONOSCOPY;  Surgeon: Lamar Bunk, MD;  Location: [facility] ENDOSCOPY;  Service: Endoscopy;  Laterality: N/A;   ERCP N/A 11/04/2017   Procedure: ENDOSCOPIC RETROGRADE CHOLANGIOPANCREATOGRAPHY (ERCP) Balloon Extraction;  Surgeon: Saintclair Jasper, MD;  Location: [facility] ENDOSCOPY;  Service: Gastroenterology;  Laterality: N/A;   LAPAROSCOPIC CHOLECYSTECTOMY W/ CHOLANGIOGRAPHY   11/05/2017   Dr. Curvin   OOPHORECTOMY     One ovary removed   SPHINCTEROTOMY  11/04/2017   Procedure: SPHINCTEROTOMY;  Surgeon: Saintclair Jasper, MD;  Location: [facility] ENDOSCOPY;  Service: Gastroenterology;;   TOTAL THYROIDECTOMY     Patient Active Problem List   Diagnosis Date Noted   Choledocholithiasis with acute cholecystitis with obstruction 11/02/2017   Chronic kidney disease, stage 3 (HCC) 11/02/2017   DVT (deep venous thrombosis) (HCC) 11/12/2015   Pulmonary emboli (HCC) 11/11/2015   Acute pulmonary embolism (HCC) 11/11/2015   Coronary artery calcification seen on CAT scan 05/12/2015   Thoracic aortic aneurysm (HCC) 05/12/2015   Dyspnea 03/17/2015   Left leg swelling 03/16/2015   Acute colitis 10/04/2014   COPD (chronic obstructive pulmonary disease) (HCC)    PVD (peripheral vascular disease) (HCC) 04/01/2012   Leg pain 04/01/2012   Swelling of limb 04/01/2012   History of shingles    Hypertension    Fibroid    Thyroid  disease    Anxiety     ONSET DATE: 08/23/2023 (referral)   REFERRING DIAG: R26.89 (ICD-10-CM) - Imbalance R29.6 (ICD-10-CM) - Falls  THERAPY DIAG:  Unsteadiness on feet  Other abnormalities of gait and mobility  Muscle weakness (generalized)  Rationale for Evaluation and Treatment: Rehabilitation  SUBJECTIVE:  SUBJECTIVE STATEMENT: Pt presents alone without AD. Wearing tennis shoes today. States she saw her PCP yesterday and is a ticking time bomb. Did not successfully fill out her paperwork for behavioral health but did go to [employer] and get more paperwork on Friday. No falls.  Pt reports she had a meltdown yesterday, directed at her grandson regarding all the clutter at her house. Feels better today.     Pt accompanied by: self (pt is driving)   PERTINENT  HISTORY: HTN, HLD, CHF, pre-DM, mixed connective tissue disease, DVT and PE (on Xarelto ), pancreatitis, COPD, former smoker, gallbladder removal, CKD, depression  PAIN:  Are you having pain? No  PRECAUTIONS: Fall  RED FLAGS: None   WEIGHT BEARING RESTRICTIONS: No  FALLS: Has patient fallen in last 6 months? Yes. Number of falls 1  LIVING ENVIRONMENT: Lives with: lives alone Lives in: House/apartment Stairs: Yes: External: 3 steps; on right going up, on left going up, and can reach both Has following equipment at home: Single point cane, shower chair, Grab bars, and has to put shower chair and grab bars together, no installed currently   PLOF: Independent  PATIENT GOALS: I need my balance back and to be able to walk better    OBJECTIVE:  Note: Objective measures were completed at Evaluation unless otherwise noted.  DIAGNOSTIC FINDINGS: CT of head from 08/2022  IMPRESSION: 1. No CT evidence for acute intracranial abnormality. 2. Atrophy and chronic small vessel ischemic changes of the white matter.  COGNITION: Overall cognitive status: Difficulty to assess due to: no family present   SENSATION: Pt reports impaired sensation in bilateral feet and hands    EDEMA: Chronic swelling in LLE - history of DVT in 2019     POSTURE: rounded shoulders, forward head, and increased thoracic kyphosis  LOWER EXTREMITY ROM:     Active  Right Eval Left Eval  Hip flexion    Hip extension    Hip abduction    Hip adduction    Hip internal rotation    Hip external rotation    Knee flexion    Knee extension    Ankle dorsiflexion    Ankle plantarflexion    Ankle inversion    Ankle eversion     (Blank rows = not tested)  LOWER EXTREMITY MMT:  Tested in seated position   MMT Right Eval Left Eval  Hip flexion 3+ 3+  Hip extension    Hip abduction 4 4  Hip adduction 3+ 3+  Hip internal rotation    Hip external rotation    Knee flexion 4- 4-  Knee extension 4- 4-   Ankle dorsiflexion 4 4  Ankle plantarflexion    Ankle inversion    Ankle eversion    (Blank rows = not tested)  BED MOBILITY:  Not tested  TRANSFERS: Sit to stand: SBA  Assistive device utilized: None     Stand to sit: SBA  Assistive device utilized: None      RAMP:  Not tested  CURB:  Not tested  STAIRS: Not tested GAIT: Gait pattern: step through pattern, decreased arm swing- Right, decreased arm swing- Left, decreased stride length, decreased hip/knee flexion- Right, decreased hip/knee flexion- Left, lateral hip instability, decreased trunk rotation, wide BOS, and poor foot clearance- Left Distance walked: Various clinic distances  Assistive device utilized: None Level of assistance: Modified independence Comments: Pt ambulates w/wide BOS and waddle-like pattern. Decreased cadence. No LOB noted    FUNCTIONAL TESTS:  VITALS  Vitals:   09/24/23 0942  BP: (!) 152/87  Pulse: 68                                                                                                                                 TREATMENT:   Self-care/home management  Assessed vitals (see above) and systolic BP elevated but within limits for session.  Provided therapeutic listening as pt discussed her recent PCP appointment and losing her paperwork for [facility]. Pt reports she is going to start focusing on herself and she has a good plan to follow up about eye surgery and w/GI. Pt has obtained new packet of paperwork for [facility] and reports she will not leave for [facility] today and instead focus on setting up her appointments.    Ther Act  SciFit multi-peaks level 7.5 for 8 minutes using BUE/BLEs for neural priming for reciprocal movement, dynamic cardiovascular warmup and increased amplitude of stepping. RPE of 5/10 following activity.  In // bars for improved functional BLE strength and step clearance:  Lateral monster walks w/green theraband around distal quads, x2 reps w/BUE support. Pt  reported exercise being easy, so moved band to distal ankles, x2 reps, which achieved proper stimulus. Added to HEP (see bolded below). Pt able to don/doff band independently.    PATIENT EDUCATION: Education details: updates to HEP  Person educated: Patient Education method: Explanation, Facilities manager, and Handouts Education comprehension: verbalized understanding, returned demonstration, and needs further education  HOME EXERCISE PROGRAM: Access Code: [email or web address] URL: [email or web address] Date: 09/20/2023 Prepared by: Marlon Diondre Pulis  Exercises - Seated march over  - 1 x daily - 7 x weekly - 3 sets - 10-15 reps - Side Stepping with Resistance at Ankles and Counter Support  - 1 x daily - 7 x weekly - 2-5 reps  GOALS: Goals reviewed with patient? Yes  SHORT TERM GOALS: Target date: 10/07/2023   Pt will be independent with initial HEP for improved strength, balance, transfers and gait.  Baseline: not established on eval  Goal status: INITIAL  2.  Pt will improve 5 x STS to less than or equal to 25 seconds to demonstrate improved functional strength and transfer efficiency.   Baseline: 30.16s w/BUE support  Goal status: INITIAL  3.  Pt will improve gait velocity to at least 1.0 m/s for improved gait efficiency and independence    Baseline: 0.91 m/s no AD  Goal status: REVISED  4.  Pt will trial various AD to determine safest option for pt for reduced fall risk  Baseline: pt has SPC that she has not used  Goal status: INITIAL  5.  FGA to be assessed and LTG updated  Baseline:  Goal status: INITIAL  LONG TERM GOALS: Target date: 10/21/2023   Pt will be independent with final HEP for improved strength, balance, transfers and gait.  Baseline:  Goal status: INITIAL  2.  Pt will improve 5 x STS to less than  or equal to 20 seconds to demonstrate improved functional strength and transfer efficiency.   Baseline: 30.16s w/BUE support Goal status: INITIAL  3.   Pt will improve gait velocity to at least 1.2 m/s for improved gait efficiency and independence   Baseline: 0.91 m/s no AD (7/18) Goal status: REVISED  4.  FGA goal  Baseline:  Goal status: INITIAL  5.  Pt will perform floor transfer mod I for safe fall recovery and proper body mechanics  Baseline:  Goal status: INITIAL   ASSESSMENT:  CLINICAL IMPRESSION: Emphasis of skilled PT session on providing therapeutic listening as pt discussed recent PCP appointment, improved endurance and functional hip strength. Pt reports she is ready to start focusing on herself and work on establishing all her follow-ups that she needed months ago. Pt overall doing well but does tend to push-back against therapist regarding safety concerns. Pt will continue to benefit from skilled PT to improve functional strength and improve safety due to neuropathy. Continue POC.    OBJECTIVE IMPAIRMENTS: Abnormal gait, decreased activity tolerance, decreased balance, decreased cognition, decreased endurance, decreased knowledge of condition, decreased knowledge of use of DME, decreased mobility, difficulty walking, decreased strength, decreased safety awareness, increased edema, impaired sensation, improper body mechanics, and pain  ACTIVITY LIMITATIONS: carrying, lifting, bending, standing, squatting, stairs, transfers, hygiene/grooming, locomotion level, and caring for others  PARTICIPATION LIMITATIONS: meal prep, cleaning, laundry, medication management, interpersonal relationship, driving, shopping, community activity, and yard work  PERSONAL FACTORS: Age, Fitness, Past/current experiences, Transportation, and 1-2 comorbidities: mixed connective tissue disease and polyneuropathy are also affecting patient's functional outcome.   REHAB POTENTIAL: Good  CLINICAL DECISION MAKING: Evolving/moderate complexity  EVALUATION COMPLEXITY: Moderate  PLAN:  PT FREQUENCY: 2x/week  PT DURATION: 6 weeks  PLANNED  INTERVENTIONS: 97164- PT Re-evaluation, 97750- Physical Performance Testing, 97110-Therapeutic exercises, 97530- Therapeutic activity, [id number]- Neuromuscular re-education, 97535- Self Care, [id number]- Manual therapy, [id number]- Gait training, [phone]- Canalith repositioning, [id number]- Aquatic Therapy, [phone]- Electrical stimulation (manual), [phone] (1-2 muscles), [id number] (3+ muscles)- Dry Needling, Patient/Family education, Balance training, Stair training, Joint mobilization, Spinal mobilization, Vestibular training, and DME instructions  PLAN FOR NEXT SESSION: FGA and update goals. Trial a SPC and rollator. Establish HEP for BLE and hip strength (sit to stands, hip 4-way, march overs, bridges). Scifit for endurance. Did she bring paperwork to [facility]?   Neidy Guerrieri E Safari Cinque, PT, DPT 09/24/2023, 10:44 AM

## 2023-09-27 ENCOUNTER — Ambulatory Visit: Admitting: Physical Therapy

## 2023-09-27 VITALS — BP 117/90 | HR 74

## 2023-09-27 DIAGNOSIS — R2681 Unsteadiness on feet: Secondary | ICD-10-CM

## 2023-09-27 DIAGNOSIS — R2689 Other abnormalities of gait and mobility: Secondary | ICD-10-CM

## 2023-09-27 DIAGNOSIS — M6281 Muscle weakness (generalized): Secondary | ICD-10-CM

## 2023-09-27 NOTE — Therapy (Signed)
 OUTPATIENT PHYSICAL THERAPY NEURO TREATMENT   Patient Name: Denise Cline MRN: [id number] DOB:07/06/1940, 83 y.o., female Today's Date: 09/27/2023   PCP: Campbell Reynolds, NP REFERRING PROVIDER: Leigh Venetia LITTIE, MD  END OF SESSION:  PT End of Session - 09/27/23 1234     Visit Number 5    Number of Visits 13   Plus eval   Date for PT Re-Evaluation 10/28/23    Authorization Type [employer] Medicare    Authorization Time Period 09/09/23 - 10/21/23 12 PT visits    PT Start Time 1233    PT Stop Time 1313    PT Time Calculation (min) 40 min    Equipment Utilized During Treatment Gait belt    Activity Tolerance Patient tolerated treatment well    Behavior During Therapy WFL for tasks assessed/performed             Past Medical History:  Diagnosis Date   Acute colitis    Anxiety    B12 deficiency    Bilateral swelling of feet    Constipation    COPD (chronic obstructive pulmonary disease) (HCC)    Depression    DVT (deep venous thrombosis) (HCC) 2014, 2017   Fibroid    Gallbladder problem    Heart valve problem    High cholesterol    History of shingles    Hypertension    IBS (irritable bowel syndrome)    Joint pain    PE (pulmonary thromboembolism) (HCC) 2017   SOB (shortness of breath)    Thyroid  disease    Nodules   Varicose veins    Vitamin D  deficiency    Past Surgical History:  Procedure Laterality Date   ABDOMINAL HYSTERECTOMY     CHOLECYSTECTOMY N/A 11/05/2017   Procedure: LAPAROSCOPIC CHOLECYSTECTOMY WITH INTRAOPERATIVE CHOLANGIOGRAM;  Surgeon: Curvin Deward MOULD, MD;  Location: [facility] OR;  Service: General;  Laterality: N/A;   COLONOSCOPY N/A 10/05/2014   Procedure: COLONOSCOPY;  Surgeon: Lamar Bunk, MD;  Location: [facility] ENDOSCOPY;  Service: Endoscopy;  Laterality: N/A;   ERCP N/A 11/04/2017   Procedure: ENDOSCOPIC RETROGRADE CHOLANGIOPANCREATOGRAPHY (ERCP) Balloon Extraction;  Surgeon: Saintclair Jasper, MD;  Location: [facility] ENDOSCOPY;  Service: Gastroenterology;  Laterality:  N/A;   LAPAROSCOPIC CHOLECYSTECTOMY W/ CHOLANGIOGRAPHY  11/05/2017   Dr. Curvin   OOPHORECTOMY     One ovary removed   SPHINCTEROTOMY  11/04/2017   Procedure: SPHINCTEROTOMY;  Surgeon: Saintclair Jasper, MD;  Location: [facility] ENDOSCOPY;  Service: Gastroenterology;;   TOTAL THYROIDECTOMY     Patient Active Problem List   Diagnosis Date Noted   Choledocholithiasis with acute cholecystitis with obstruction 11/02/2017   Chronic kidney disease, stage 3 (HCC) 11/02/2017   DVT (deep venous thrombosis) (HCC) 11/12/2015   Pulmonary emboli (HCC) 11/11/2015   Acute pulmonary embolism (HCC) 11/11/2015   Coronary artery calcification seen on CAT scan 05/12/2015   Thoracic aortic aneurysm (HCC) 05/12/2015   Dyspnea 03/17/2015   Left leg swelling 03/16/2015   Acute colitis 10/04/2014   COPD (chronic obstructive pulmonary disease) (HCC)    PVD (peripheral vascular disease) (HCC) 04/01/2012   Leg pain 04/01/2012   Swelling of limb 04/01/2012   History of shingles    Hypertension    Fibroid    Thyroid  disease    Anxiety     ONSET DATE: 08/23/2023 (referral)   REFERRING DIAG: R26.89 (ICD-10-CM) - Imbalance R29.6 (ICD-10-CM) - Falls  THERAPY DIAG:  Unsteadiness on feet  Other abnormalities of gait and mobility  Muscle weakness (generalized)  Rationale for  Evaluation and Treatment: Rehabilitation  SUBJECTIVE:                                                                                                                                                                                             SUBJECTIVE STATEMENT: Pt presents alone without AD. Reports she has been very angry this morning so my blood pressure is probably out the roof. Denies pain or acute changes today.     Pt accompanied by: self (pt is driving)   PERTINENT HISTORY: HTN, HLD, CHF, pre-DM, mixed connective tissue disease, DVT and PE (on Xarelto ), pancreatitis, COPD, former smoker, gallbladder removal, CKD, depression  PAIN:   Are you having pain? No  PRECAUTIONS: Fall  RED FLAGS: None   WEIGHT BEARING RESTRICTIONS: No  FALLS: Has patient fallen in last 6 months? Yes. Number of falls 1  LIVING ENVIRONMENT: Lives with: lives alone Lives in: House/apartment Stairs: Yes: External: 3 steps; on right going up, on left going up, and can reach both Has following equipment at home: Single point cane, shower chair, Grab bars, and has to put shower chair and grab bars together, no installed currently   PLOF: Independent  PATIENT GOALS: I need my balance back and to be able to walk better    OBJECTIVE:  Note: Objective measures were completed at Evaluation unless otherwise noted.  DIAGNOSTIC FINDINGS: CT of head from 08/2022  IMPRESSION: 1. No CT evidence for acute intracranial abnormality. 2. Atrophy and chronic small vessel ischemic changes of the white matter.  COGNITION: Overall cognitive status: Difficulty to assess due to: no family present   SENSATION: Pt reports impaired sensation in bilateral feet and hands    EDEMA: Chronic swelling in LLE - history of DVT in 2019     POSTURE: rounded shoulders, forward head, and increased thoracic kyphosis  LOWER EXTREMITY ROM:     Active  Right Eval Left Eval  Hip flexion    Hip extension    Hip abduction    Hip adduction    Hip internal rotation    Hip external rotation    Knee flexion    Knee extension    Ankle dorsiflexion    Ankle plantarflexion    Ankle inversion    Ankle eversion     (Blank rows = not tested)  LOWER EXTREMITY MMT:  Tested in seated position   MMT Right Eval Left Eval  Hip flexion 3+ 3+  Hip extension    Hip abduction 4 4  Hip adduction 3+ 3+  Hip internal rotation    Hip external rotation    Knee flexion 4- 4-  Knee extension 4- 4-  Ankle dorsiflexion 4 4  Ankle plantarflexion    Ankle inversion    Ankle eversion    (Blank rows = not tested)  BED MOBILITY:  Not tested  TRANSFERS: Sit to stand:  SBA  Assistive device utilized: None     Stand to sit: SBA  Assistive device utilized: None      RAMP:  Not tested  CURB:  Not tested  STAIRS: Not tested GAIT: Gait pattern: step through pattern, decreased arm swing- Right, decreased arm swing- Left, decreased stride length, decreased hip/knee flexion- Right, decreased hip/knee flexion- Left, lateral hip instability, decreased trunk rotation, wide BOS, and poor foot clearance- Left Distance walked: Various clinic distances  Assistive device utilized: None Level of assistance: Modified independence Comments: Pt ambulates w/wide BOS and waddle-like pattern. Decreased cadence. No LOB noted    FUNCTIONAL TESTS:       VITALS  Vitals:   09/27/23 1237  BP: (!) 117/90  Pulse: 74                                                                                                                                  TREATMENT:   Self-care/home management  Assessed vitals (see above) and diastolic BP elevated but within limits for session.   Ther Act  SciFit multi-peaks level 8.5 for 8 minutes using BUE/BLEs for neural priming for reciprocal movement, dynamic cardiovascular warmup and increased amplitude of stepping. RPE of 6-7/10 following activity.  In // bars for improved functional BLE strength, single leg stability and step clearance:  Alt fwd adv/retreat over 6 hurdle, x10 reps per side. Increased difficulty w/LLE. No UE support throughout.  Lateral adv/retreat over 6 hurdle, x12 reps per side. Increased difficulty performing on RLE due to weakness of LLE  On airex, alt cone taps w/intermittent UE support and CGA. Pt frequently leaning on bars to stabilize w/activity.  RPE of 8/10 following session     PATIENT EDUCATION: Education details: Continue HEP, next appointment date and time  Person educated: Patient Education method: Explanation, Demonstration, and Handouts Education comprehension: verbalized understanding, returned  demonstration, and needs further education  HOME EXERCISE PROGRAM: Access Code: [email or web address] URL: [email or web address] Date: 09/20/2023 Prepared by: Marlon Lorine Iannaccone  Exercises - Seated march over  - 1 x daily - 7 x weekly - 3 sets - 10-15 reps - Side Stepping with Resistance at Ankles and Counter Support  - 1 x daily - 7 x weekly - 2-5 reps  GOALS: Goals reviewed with patient? Yes  SHORT TERM GOALS: Target date: 10/07/2023   Pt will be independent with initial HEP for improved strength, balance, transfers and gait.  Baseline: not established on eval  Goal status: INITIAL  2.  Pt will improve 5 x STS to less than or equal to 25 seconds to demonstrate improved functional strength and transfer efficiency.   Baseline: 30.16s w/BUE support  Goal status: INITIAL  3.  Pt will improve gait velocity to at least 1.0 m/s for improved gait efficiency and independence    Baseline: 0.91 m/s no AD  Goal status: REVISED  4.  Pt will trial various AD to determine safest option for pt for reduced fall risk  Baseline: pt has SPC that she has not used  Goal status: INITIAL  5.  FGA to be assessed and LTG updated  Baseline:  Goal status: INITIAL  LONG TERM GOALS: Target date: 10/21/2023   Pt will be independent with final HEP for improved strength, balance, transfers and gait.  Baseline:  Goal status: INITIAL  2.  Pt will improve 5 x STS to less than or equal to 20 seconds to demonstrate improved functional strength and transfer efficiency.   Baseline: 30.16s w/BUE support Goal status: INITIAL  3.  Pt will improve gait velocity to at least 1.2 m/s for improved gait efficiency and independence   Baseline: 0.91 m/s no AD (7/18) Goal status: REVISED  4.  FGA goal  Baseline:  Goal status: INITIAL  5.  Pt will perform floor transfer mod I for safe fall recovery and proper body mechanics  Baseline:  Goal status: INITIAL   ASSESSMENT:  CLINICAL IMPRESSION: Emphasis  of skilled PT session on functional endurance, single leg stability and ankle strategy. Pt reports she really enjoys her HEP and has been performing regularly. Pt demonstrates functional weakness of LLE > RLE and requires bracing on external surfaces to stabilize. Pt will continue to benefit from skilled PT services for improved global strength and reduced fall risk. Continue POC.    OBJECTIVE IMPAIRMENTS: Abnormal gait, decreased activity tolerance, decreased balance, decreased cognition, decreased endurance, decreased knowledge of condition, decreased knowledge of use of DME, decreased mobility, difficulty walking, decreased strength, decreased safety awareness, increased edema, impaired sensation, improper body mechanics, and pain  ACTIVITY LIMITATIONS: carrying, lifting, bending, standing, squatting, stairs, transfers, hygiene/grooming, locomotion level, and caring for others  PARTICIPATION LIMITATIONS: meal prep, cleaning, laundry, medication management, interpersonal relationship, driving, shopping, community activity, and yard work  PERSONAL FACTORS: Age, Fitness, Past/current experiences, Transportation, and 1-2 comorbidities: mixed connective tissue disease and polyneuropathy are also affecting patient's functional outcome.   REHAB POTENTIAL: Good  CLINICAL DECISION MAKING: Evolving/moderate complexity  EVALUATION COMPLEXITY: Moderate  PLAN:  PT FREQUENCY: 2x/week  PT DURATION: 6 weeks  PLANNED INTERVENTIONS: 97164- PT Re-evaluation, 97750- Physical Performance Testing, 97110-Therapeutic exercises, 97530- Therapeutic activity, [id number]- Neuromuscular re-education, 97535- Self Care, [id number]- Manual therapy, [id number]- Gait training, [phone]- Canalith repositioning, [id number]- Aquatic Therapy, [phone]- Electrical stimulation (manual), [phone] (1-2 muscles), 20561 (3+ muscles)- Dry Needling, Patient/Family education, Balance training, Stair training, Joint mobilization, Spinal mobilization, Vestibular  training, and DME instructions  PLAN FOR NEXT SESSION: Watch BP. FGA and update goal. Add to HEP for BLE and hip strength (sit to stands, hip 4-way, march overs, bridges). Scifit for endurance. Posterior chain strength and single leg stability. Blaze pods    Mutasim Tuckey E Najeeb Uptain, PT, DPT 09/27/2023, 1:15 PM

## 2023-10-04 ENCOUNTER — Ambulatory Visit: Attending: Neurology | Admitting: Physical Therapy

## 2023-10-04 VITALS — BP 161/95 | HR 70

## 2023-10-04 DIAGNOSIS — M6281 Muscle weakness (generalized): Secondary | ICD-10-CM | POA: Diagnosis present

## 2023-10-04 DIAGNOSIS — R2689 Other abnormalities of gait and mobility: Secondary | ICD-10-CM | POA: Insufficient documentation

## 2023-10-04 DIAGNOSIS — Z9181 History of falling: Secondary | ICD-10-CM | POA: Diagnosis present

## 2023-10-04 DIAGNOSIS — R2681 Unsteadiness on feet: Secondary | ICD-10-CM | POA: Insufficient documentation

## 2023-10-04 NOTE — Therapy (Signed)
 OUTPATIENT PHYSICAL THERAPY NEURO TREATMENT   Patient Name: Denise Cline MRN: [id number] DOB:1941/01/05, 83 y.o., female Today's Date: 10/04/2023   PCP: Campbell Reynolds, NP REFERRING PROVIDER: Leigh Venetia LITTIE, MD  END OF SESSION:  PT End of Session - 10/04/23 1151     Visit Number 6    Number of Visits 13   Plus eval   Date for PT Re-Evaluation 10/28/23    Authorization Type UHC Medicare    Authorization Time Period 09/09/23 - 10/21/23 12 PT visits    PT Start Time 1150   pt arrived late   PT Stop Time 1239    PT Time Calculation (min) 49 min    Equipment Utilized During Treatment Gait belt    Activity Tolerance Patient tolerated treatment well    Behavior During Therapy WFL for tasks assessed/performed              Past Medical History:  Diagnosis Date   Acute colitis    Anxiety    B12 deficiency    Bilateral swelling of feet    Constipation    COPD (chronic obstructive pulmonary disease) (HCC)    Depression    DVT (deep venous thrombosis) (HCC) 2014, 2017   Fibroid    Gallbladder problem    Heart valve problem    High cholesterol    History of shingles    Hypertension    IBS (irritable bowel syndrome)    Joint pain    PE (pulmonary thromboembolism) (HCC) 2017   SOB (shortness of breath)    Thyroid  disease    Nodules   Varicose veins    Vitamin D  deficiency    Past Surgical History:  Procedure Laterality Date   ABDOMINAL HYSTERECTOMY     CHOLECYSTECTOMY N/A 11/05/2017   Procedure: LAPAROSCOPIC CHOLECYSTECTOMY WITH INTRAOPERATIVE CHOLANGIOGRAM;  Surgeon: Curvin Deward MOULD, MD;  Location: [facility] OR;  Service: General;  Laterality: N/A;   COLONOSCOPY N/A 10/05/2014   Procedure: COLONOSCOPY;  Surgeon: Lamar Bunk, MD;  Location: [facility] ENDOSCOPY;  Service: Endoscopy;  Laterality: N/A;   ERCP N/A 11/04/2017   Procedure: ENDOSCOPIC RETROGRADE CHOLANGIOPANCREATOGRAPHY (ERCP) Balloon Extraction;  Surgeon: Saintclair Jasper, MD;  Location: [facility] ENDOSCOPY;  Service:  Gastroenterology;  Laterality: N/A;   LAPAROSCOPIC CHOLECYSTECTOMY W/ CHOLANGIOGRAPHY  11/05/2017   Dr. Curvin   OOPHORECTOMY     One ovary removed   SPHINCTEROTOMY  11/04/2017   Procedure: SPHINCTEROTOMY;  Surgeon: Saintclair Jasper, MD;  Location: [facility] ENDOSCOPY;  Service: Gastroenterology;;   TOTAL THYROIDECTOMY     Patient Active Problem List   Diagnosis Date Noted   Choledocholithiasis with acute cholecystitis with obstruction 11/02/2017   Chronic kidney disease, stage 3 (HCC) 11/02/2017   DVT (deep venous thrombosis) (HCC) 11/12/2015   Pulmonary emboli (HCC) 11/11/2015   Acute pulmonary embolism (HCC) 11/11/2015   Coronary artery calcification seen on CAT scan 05/12/2015   Thoracic aortic aneurysm (HCC) 05/12/2015   Dyspnea 03/17/2015   Left leg swelling 03/16/2015   Acute colitis 10/04/2014   COPD (chronic obstructive pulmonary disease) (HCC)    PVD (peripheral vascular disease) (HCC) 04/01/2012   Leg pain 04/01/2012   Swelling of limb 04/01/2012   History of shingles    Hypertension    Fibroid    Thyroid  disease    Anxiety     ONSET DATE: 08/23/2023 (referral)   REFERRING DIAG: R26.89 (ICD-10-CM) - Imbalance R29.6 (ICD-10-CM) - Falls  THERAPY DIAG:  Unsteadiness on feet  Other abnormalities of gait and mobility  Muscle  weakness (generalized)  History of falling  Rationale for Evaluation and Treatment: Rehabilitation  SUBJECTIVE:                                                                                                                                                                                             SUBJECTIVE STATEMENT:  Pt reports she has been doing yard work this week and is feeling very sore in her hips and buttocks. Pt reports all she can do today is the exercise bike. Pt denies any fall since last visit.   Pt accompanied by: self (pt is driving)   PERTINENT HISTORY: HTN, HLD, CHF, pre-DM, mixed connective tissue disease, DVT and PE (on Xarelto ),  pancreatitis, COPD, former smoker, gallbladder removal, CKD, depression  PAIN:  Are you having pain? No  PRECAUTIONS: Fall  RED FLAGS: None   WEIGHT BEARING RESTRICTIONS: No  FALLS: Has patient fallen in last 6 months? Yes. Number of falls 1  LIVING ENVIRONMENT: Lives with: lives alone Lives in: House/apartment Stairs: Yes: External: 3 steps; on right going up, on left going up, and can reach both Has following equipment at home: Single point cane, shower chair, Grab bars, and has to put shower chair and grab bars together, no installed currently   PLOF: Independent  PATIENT GOALS: I need my balance back and to be able to walk better    OBJECTIVE:  Note: Objective measures were completed at Evaluation unless otherwise noted.  DIAGNOSTIC FINDINGS: CT of head from 08/2022  IMPRESSION: 1. No CT evidence for acute intracranial abnormality. 2. Atrophy and chronic small vessel ischemic changes of the white matter.  COGNITION: Overall cognitive status: Difficulty to assess due to: no family present   SENSATION: Pt reports impaired sensation in bilateral feet and hands    EDEMA: Chronic swelling in LLE - history of DVT in 2019     POSTURE: rounded shoulders, forward head, and increased thoracic kyphosis  LOWER EXTREMITY ROM:     Active  Right Eval Left Eval  Hip flexion    Hip extension    Hip abduction    Hip adduction    Hip internal rotation    Hip external rotation    Knee flexion    Knee extension    Ankle dorsiflexion    Ankle plantarflexion    Ankle inversion    Ankle eversion     (Blank rows = not tested)  LOWER EXTREMITY MMT:  Tested in seated position   MMT Right Eval Left Eval  Hip flexion 3+ 3+  Hip extension    Hip abduction 4 4  Hip adduction 3+  3+  Hip internal rotation    Hip external rotation    Knee flexion 4- 4-  Knee extension 4- 4-  Ankle dorsiflexion 4 4  Ankle plantarflexion    Ankle inversion    Ankle eversion     (Blank rows = not tested)  BED MOBILITY:  Not tested  TRANSFERS: Sit to stand: SBA  Assistive device utilized: None     Stand to sit: SBA  Assistive device utilized: None      RAMP:  Not tested  CURB:  Not tested  STAIRS: Not tested GAIT: Gait pattern: step through pattern, decreased arm swing- Right, decreased arm swing- Left, decreased stride length, decreased hip/knee flexion- Right, decreased hip/knee flexion- Left, lateral hip instability, decreased trunk rotation, wide BOS, and poor foot clearance- Left Distance walked: Various clinic distances  Assistive device utilized: None Level of assistance: Modified independence Comments: Pt ambulates w/wide BOS and waddle-like pattern. Decreased cadence. No LOB noted    FUNCTIONAL TESTS:       VITALS  Vitals:   10/04/23 1158 10/04/23 1159  BP: (!) 163/104 (!) 161/95  Pulse:  70                                                                                                                                TREATMENT:   Self-care/home management  Assessed vitals (see above) and diastolic BP elevated, initially too high for safe participation but after 2 min seated rest break reassessed and within limits for session. Educated patient on safe BP range and BP goal.   Ther Act  SciFit multi-peaks level 9 for 8 minutes using BUE/BLEs for neural priming for reciprocal movement, dynamic cardiovascular warmup and increased amplitude of stepping. RPE of 6-7/10 following activity.  To address pain and muscle tightness in B glutes: Supine SKTC 3 x 30 sec each B Sidestepping in // bars 3 x 10 ft L/R no UE Added in red TB around ankles 3 x 10 ft L/R no UE    PATIENT EDUCATION: Education details: Continue HEP, next appointment date and time Person educated: Patient Education method: Medical illustrator Education comprehension: verbalized understanding, returned demonstration, and needs further education  HOME EXERCISE  PROGRAM: Access Code: [email or web address] URL: [email or web address] Date: 09/20/2023 Prepared by: Marlon Plaster  Exercises - Seated march over  - 1 x daily - 7 x weekly - 3 sets - 10-15 reps - Side Stepping with Resistance at Ankles and Counter Support  - 1 x daily - 7 x weekly - 2-5 reps  GOALS: Goals reviewed with patient? Yes  SHORT TERM GOALS: Target date: 10/07/2023   Pt will be independent with initial HEP for improved strength, balance, transfers and gait.  Baseline: not established on eval  Goal status: INITIAL  2.  Pt will improve 5 x STS to less than or equal to 25 seconds to demonstrate improved functional strength and transfer efficiency.   Baseline: 30.16s w/BUE support  Goal status: INITIAL  3.  Pt will improve gait velocity to at least 1.0 m/s for improved gait efficiency and independence    Baseline: 0.91 m/s no AD  Goal status: REVISED  4.  Pt will trial various AD to determine safest option for pt for reduced fall risk  Baseline: pt has SPC that she has not used  Goal status: INITIAL  5.  FGA to be assessed and LTG updated  Baseline:  Goal status: INITIAL  LONG TERM GOALS: Target date: 10/21/2023   Pt will be independent with final HEP for improved strength, balance, transfers and gait.  Baseline:  Goal status: INITIAL  2.  Pt will improve 5 x STS to less than or equal to 20 seconds to demonstrate improved functional strength and transfer efficiency.   Baseline: 30.16s w/BUE support Goal status: INITIAL  3.  Pt will improve gait velocity to at least 1.2 m/s for improved gait efficiency and independence   Baseline: 0.91 m/s no AD (7/18) Goal status: REVISED  4.  FGA goal  Baseline:  Goal status: INITIAL  5.  Pt will perform floor transfer mod I for safe fall recovery and proper body mechanics  Baseline:  Goal status: INITIAL   ASSESSMENT:  CLINICAL IMPRESSION: Emphasis of skilled PT session on continuing to work on functional  endurance and addressing glute soreness. Pt initially with elevated BP but improves after seated rest break though it still remains high. Pt's glute soreness reduced by end of session. Pt will continue to benefit from skilled PT services for improved global strength and reduced fall risk. Continue POC.    OBJECTIVE IMPAIRMENTS: Abnormal gait, decreased activity tolerance, decreased balance, decreased cognition, decreased endurance, decreased knowledge of condition, decreased knowledge of use of DME, decreased mobility, difficulty walking, decreased strength, decreased safety awareness, increased edema, impaired sensation, improper body mechanics, and pain  ACTIVITY LIMITATIONS: carrying, lifting, bending, standing, squatting, stairs, transfers, hygiene/grooming, locomotion level, and caring for others  PARTICIPATION LIMITATIONS: meal prep, cleaning, laundry, medication management, interpersonal relationship, driving, shopping, community activity, and yard work  PERSONAL FACTORS: Age, Fitness, Past/current experiences, Transportation, and 1-2 comorbidities: mixed connective tissue disease and polyneuropathy are also affecting patient's functional outcome.   REHAB POTENTIAL: Good  CLINICAL DECISION MAKING: Evolving/moderate complexity  EVALUATION COMPLEXITY: Moderate  PLAN:  PT FREQUENCY: 2x/week  PT DURATION: 6 weeks  PLANNED INTERVENTIONS: 97164- PT Re-evaluation, 97750- Physical Performance Testing, 97110-Therapeutic exercises, 97530- Therapeutic activity, [id number]- Neuromuscular re-education, 97535- Self Care, 02859- Manual therapy, [id number]- Gait training, [phone]- Canalith repositioning, [id number]- Aquatic Therapy, [phone]- Electrical stimulation (manual), [phone] (1-2 muscles), [id number] (3+ muscles)- Dry Needling, Patient/Family education, Balance training, Stair training, Joint mobilization, Spinal mobilization, Vestibular training, and DME instructions  PLAN FOR NEXT SESSION: assess STGs Watch BP. FGA  and update goal. Add to HEP for BLE and hip strength (sit to stands, hip 4-way, march overs, bridges). Scifit for endurance. Posterior chain strength and single leg stability. Blaze pods    Waddell Southgate, PT Waddell Southgate, PT, DPT, CSRS  10/04/2023, 12:43 PM

## 2023-10-07 ENCOUNTER — Ambulatory Visit: Admitting: Physical Therapy

## 2023-10-07 VITALS — BP 140/96 | HR 65

## 2023-10-07 DIAGNOSIS — R2681 Unsteadiness on feet: Secondary | ICD-10-CM

## 2023-10-07 DIAGNOSIS — M6281 Muscle weakness (generalized): Secondary | ICD-10-CM

## 2023-10-07 DIAGNOSIS — R2689 Other abnormalities of gait and mobility: Secondary | ICD-10-CM

## 2023-10-07 NOTE — Therapy (Signed)
 OUTPATIENT PHYSICAL THERAPY NEURO TREATMENT   Patient Name: Denise Cline MRN: [id number] DOB:1940-04-06, 83 y.o., female Today's Date: 10/07/2023   PCP: Campbell Reynolds, NP REFERRING PROVIDER: Leigh Venetia LITTIE, MD  END OF SESSION:  PT End of Session - 10/07/23 1233     Visit Number 7    Number of Visits 13   Plus eval   Date for PT Re-Evaluation 10/28/23    Authorization Type [employer] Medicare    Authorization Time Period 09/09/23 - 10/21/23 12 PT visits    PT Start Time 1232    PT Stop Time 1314    PT Time Calculation (min) 42 min    Equipment Utilized During Treatment Gait belt    Activity Tolerance Patient tolerated treatment well;Other (comment)   Diastolic HTN   Behavior During Therapy WFL for tasks assessed/performed              Past Medical History:  Diagnosis Date   Acute colitis    Anxiety    B12 deficiency    Bilateral swelling of feet    Constipation    COPD (chronic obstructive pulmonary disease) (HCC)    Depression    DVT (deep venous thrombosis) (HCC) 2014, 2017   Fibroid    Gallbladder problem    Heart valve problem    High cholesterol    History of shingles    Hypertension    IBS (irritable bowel syndrome)    Joint pain    PE (pulmonary thromboembolism) (HCC) 2017   SOB (shortness of breath)    Thyroid  disease    Nodules   Varicose veins    Vitamin D  deficiency    Past Surgical History:  Procedure Laterality Date   ABDOMINAL HYSTERECTOMY     CHOLECYSTECTOMY N/A 11/05/2017   Procedure: LAPAROSCOPIC CHOLECYSTECTOMY WITH INTRAOPERATIVE CHOLANGIOGRAM;  Surgeon: Curvin Deward MOULD, MD;  Location: [facility] OR;  Service: General;  Laterality: N/A;   COLONOSCOPY N/A 10/05/2014   Procedure: COLONOSCOPY;  Surgeon: Lamar Bunk, MD;  Location: [facility] ENDOSCOPY;  Service: Endoscopy;  Laterality: N/A;   ERCP N/A 11/04/2017   Procedure: ENDOSCOPIC RETROGRADE CHOLANGIOPANCREATOGRAPHY (ERCP) Balloon Extraction;  Surgeon: Saintclair Jasper, MD;  Location: [facility] ENDOSCOPY;  Service:  Gastroenterology;  Laterality: N/A;   LAPAROSCOPIC CHOLECYSTECTOMY W/ CHOLANGIOGRAPHY  11/05/2017   Dr. Curvin   OOPHORECTOMY     One ovary removed   SPHINCTEROTOMY  11/04/2017   Procedure: SPHINCTEROTOMY;  Surgeon: Saintclair Jasper, MD;  Location: [facility] ENDOSCOPY;  Service: Gastroenterology;;   TOTAL THYROIDECTOMY     Patient Active Problem List   Diagnosis Date Noted   Choledocholithiasis with acute cholecystitis with obstruction 11/02/2017   Chronic kidney disease, stage 3 (HCC) 11/02/2017   DVT (deep venous thrombosis) (HCC) 11/12/2015   Pulmonary emboli (HCC) 11/11/2015   Acute pulmonary embolism (HCC) 11/11/2015   Coronary artery calcification seen on CAT scan 05/12/2015   Thoracic aortic aneurysm (HCC) 05/12/2015   Dyspnea 03/17/2015   Left leg swelling 03/16/2015   Acute colitis 10/04/2014   COPD (chronic obstructive pulmonary disease) (HCC)    PVD (peripheral vascular disease) (HCC) 04/01/2012   Leg pain 04/01/2012   Swelling of limb 04/01/2012   History of shingles    Hypertension    Fibroid    Thyroid  disease    Anxiety     ONSET DATE: 08/23/2023 (referral)   REFERRING DIAG: R26.89 (ICD-10-CM) - Imbalance R29.6 (ICD-10-CM) - Falls  THERAPY DIAG:  Unsteadiness on feet  Other abnormalities of gait and mobility  Muscle  weakness (generalized)  Rationale for Evaluation and Treatment: Rehabilitation  SUBJECTIVE:                                                                                                                                                                                             SUBJECTIVE STATEMENT:  Pt reports doing well. Has not had to go to [place] in a week. Is going to get her paperwork to [facility] this week. No falls. Pt reports she is still having GI issues, needs to call her GI doctor.    Pt accompanied by: self (pt is driving)   PERTINENT HISTORY: HTN, HLD, CHF, pre-DM, mixed connective tissue disease, DVT and PE (on Xarelto ), pancreatitis, COPD,  former smoker, gallbladder removal, CKD, depression  PAIN:  Are you having pain? No  PRECAUTIONS: Fall  RED FLAGS: None   WEIGHT BEARING RESTRICTIONS: No  FALLS: Has patient fallen in last 6 months? Yes. Number of falls 1  LIVING ENVIRONMENT: Lives with: lives alone Lives in: House/apartment Stairs: Yes: External: 3 steps; on right going up, on left going up, and can reach both Has following equipment at home: Single point cane, shower chair, Grab bars, and has to put shower chair and grab bars together, no installed currently   PLOF: Independent  PATIENT GOALS: I need my balance back and to be able to walk better    OBJECTIVE:  Note: Objective measures were completed at Evaluation unless otherwise noted.  DIAGNOSTIC FINDINGS: CT of head from 08/2022  IMPRESSION: 1. No CT evidence for acute intracranial abnormality. 2. Atrophy and chronic small vessel ischemic changes of the white matter.  COGNITION: Overall cognitive status: Difficulty to assess due to: no family present   SENSATION: Pt reports impaired sensation in bilateral feet and hands    EDEMA: Chronic swelling in LLE - history of DVT in 2019     POSTURE: rounded shoulders, forward head, and increased thoracic kyphosis  LOWER EXTREMITY ROM:     Active  Right Eval Left Eval  Hip flexion    Hip extension    Hip abduction    Hip adduction    Hip internal rotation    Hip external rotation    Knee flexion    Knee extension    Ankle dorsiflexion    Ankle plantarflexion    Ankle inversion    Ankle eversion     (Blank rows = not tested)  LOWER EXTREMITY MMT:  Tested in seated position   MMT Right Eval Left Eval  Hip flexion 3+ 3+  Hip extension    Hip abduction 4 4  Hip adduction 3+ 3+  Hip internal rotation    Hip external rotation    Knee flexion 4- 4-  Knee extension 4- 4-  Ankle dorsiflexion 4 4  Ankle plantarflexion    Ankle inversion    Ankle eversion    (Blank rows = not  tested)  BED MOBILITY:  Not tested  TRANSFERS: Sit to stand: SBA  Assistive device utilized: None     Stand to sit: SBA  Assistive device utilized: None      RAMP:  Not tested  CURB:  Not tested  STAIRS: Not tested GAIT: Gait pattern: step through pattern, decreased arm swing- Right, decreased arm swing- Left, decreased stride length, decreased hip/knee flexion- Right, decreased hip/knee flexion- Left, lateral hip instability, decreased trunk rotation, wide BOS, and poor foot clearance- Left Distance walked: Various clinic distances  Assistive device utilized: None Level of assistance: Modified independence Comments: Pt ambulates w/wide BOS and waddle-like pattern. Decreased cadence. No LOB noted    FUNCTIONAL TESTS:   [facility] PT Assessment - 10/07/23 1304       Transfers   Five time sit to stand comments  19.03s   UE support on rep 1 only     Standardized Balance Assessment   10 Meter Walk 0.96 m/s   82m over 10.43s no AD            VITALS  Vitals:   10/07/23 1237 10/07/23 1241 10/07/23 1259 10/07/23 1310  BP: (!) 150/106 (!) 139/95 136/84 Comment: After SciFit (!) 140/96 Comment: End of session  Pulse: 68 64 68 65                                                                                                                                 TREATMENT:   Self-care/home management  Assessed vitals (see above) and diastolic BP elevated, initially too high for safe participation but after 2 min seated rest break reassessed and within limits for session. Continued to closely monitor BP throughout session and strongly recommended pt call her PCP regarding HTN. Pt reports she will call today.   Ther Act  SciFit multi-peaks level 8.0 for 8 minutes using BUE/BLEs for neural priming for reciprocal movement, dynamic cardiovascular warmup and increased amplitude of stepping. Assessed vitals following activity (see above) and WNL.    [facility] PT Assessment - 10/07/23 1304        Transfers   Five time sit to stand comments  19.03s   UE support on rep 1 only     Standardized Balance Assessment   10 Meter Walk 0.96 m/s   30m over 10.43s no AD        Assessed vitals at end of session (see above) and pt's diastolic BP elevated again. Reiterated importance of pt calling her PCP today and encouraged her to continue monitoring BP at home.      PATIENT EDUCATION: Education details: Continue HEP, proper BP parameters, importance of contacting PCP today, goal  results  Person educated: Patient Education method: Explanation and Demonstration Education comprehension: verbalized understanding, returned demonstration, and needs further education  HOME EXERCISE PROGRAM: Access Code: [email or web address] URL: [email or web address] Date: 09/20/2023 Prepared by: Marlon Susumu Hackler  Exercises - Seated march over  - 1 x daily - 7 x weekly - 3 sets - 10-15 reps - Side Stepping with Resistance at Ankles and Counter Support  - 1 x daily - 7 x weekly - 2-5 reps  GOALS: Goals reviewed with patient? Yes  SHORT TERM GOALS: Target date: 10/07/2023   Pt will be independent with initial HEP for improved strength, balance, transfers and gait.  Baseline: not established on eval  Goal status: MET  2.  Pt will improve 5 x STS to less than or equal to 25 seconds to demonstrate improved functional strength and transfer efficiency.   Baseline: 30.16s w/BUE support; 19.03s w/intermittent UE support (8/4) Goal status: MET  3.  Pt will improve gait velocity to at least 1.0 m/s for improved gait efficiency and independence    Baseline: 0.91 m/s no AD; 0.96 m/s no AD (8/4)  Goal status: NOT MET  4.  Pt will trial various AD to determine safest option for pt for reduced fall risk  Baseline: pt has SPC that she has not used  Goal status: DC  5.  FGA to be assessed and LTG updated  Baseline:  Goal status: INITIAL  LONG TERM GOALS: Target date: 10/21/2023   Pt will be  independent with final HEP for improved strength, balance, transfers and gait.  Baseline:  Goal status: INITIAL  2.  Pt will improve 5 x STS to less than or equal to 17 seconds to demonstrate improved functional strength and transfer efficiency.   Baseline: 30.16s w/BUE support; 19.03s w/intermittent UE support (8/4) Goal status: REVISED  3.  Pt will improve gait velocity to at least 1.2 m/s for improved gait efficiency and independence   Baseline: 0.91 m/s no AD (7/18); 0.96 m/s no AD (8/4)  Goal status: REVISED  4.  FGA goal  Baseline:  Goal status: INITIAL  5.  Pt will perform floor transfer mod I for safe fall recovery and proper body mechanics  Baseline:  Goal status: INITIAL   ASSESSMENT:  CLINICAL IMPRESSION: Session limited due to pt's elevated BP. Emphasis of skilled PT session on monitoring vitals and STG assessment. Pt's diastolic BP elevated today beyond safe levels but did reduce w/seated rest. However, w/ambulatory activity, pt's BP increases. Pt encouraged to call PCP today regarding this as pt stopped taking all BP medications in January. Pt has met 2 of 4 STGs thus far, improving her time and quality of movement on 5x STS w/reduced UE reliance and performing her HEP regularly. Pt has improved her gait speed without AD, but not quite to goal level. Will assess FGA next session. Continue POC.    OBJECTIVE IMPAIRMENTS: Abnormal gait, decreased activity tolerance, decreased balance, decreased cognition, decreased endurance, decreased knowledge of condition, decreased knowledge of use of DME, decreased mobility, difficulty walking, decreased strength, decreased safety awareness, increased edema, impaired sensation, improper body mechanics, and pain  ACTIVITY LIMITATIONS: carrying, lifting, bending, standing, squatting, stairs, transfers, hygiene/grooming, locomotion level, and caring for others  PARTICIPATION LIMITATIONS: meal prep, cleaning, laundry, medication  management, interpersonal relationship, driving, shopping, community activity, and yard work  PERSONAL FACTORS: Age, Fitness, Past/current experiences, Transportation, and 1-2 comorbidities: mixed connective tissue disease and polyneuropathy are also affecting patient's functional outcome.   REHAB POTENTIAL: Good  CLINICAL  DECISION MAKING: Evolving/moderate complexity  EVALUATION COMPLEXITY: Moderate  PLAN:  PT FREQUENCY: 2x/week  PT DURATION: 6 weeks  PLANNED INTERVENTIONS: [id number]- PT Re-evaluation, 97750- Physical Performance Testing, 97110-Therapeutic exercises, 97530- Therapeutic activity, [id number]- Neuromuscular re-education, 97535- Self Care, [id number]- Manual therapy, [id number]- Gait training, [phone]- Canalith repositioning, [id number]- Aquatic Therapy, [phone]- Electrical stimulation (manual), [phone] (1-2 muscles), [id number] (3+ muscles)- Dry Needling, Patient/Family education, Balance training, Stair training, Joint mobilization, Spinal mobilization, Vestibular training, and DME instructions  PLAN FOR NEXT SESSION: assess FGA and update goal. Watch BP. Did she call PCP? Add to HEP for BLE and hip strength (sit to stands, hip 4-way, march overs, bridges). Scifit for endurance. Posterior chain strength and single leg stability. Blaze pods    Tri Chittick E Baneen Wieseler, PT, DPT  10/07/2023, 1:15 PM

## 2023-10-11 ENCOUNTER — Ambulatory Visit: Admitting: Physical Therapy

## 2023-10-11 VITALS — BP 103/73 | HR 71

## 2023-10-11 DIAGNOSIS — R2681 Unsteadiness on feet: Secondary | ICD-10-CM | POA: Diagnosis not present

## 2023-10-11 DIAGNOSIS — Z9181 History of falling: Secondary | ICD-10-CM

## 2023-10-11 DIAGNOSIS — M6281 Muscle weakness (generalized): Secondary | ICD-10-CM

## 2023-10-11 DIAGNOSIS — R2689 Other abnormalities of gait and mobility: Secondary | ICD-10-CM

## 2023-10-11 NOTE — Therapy (Signed)
 OUTPATIENT PHYSICAL THERAPY NEURO TREATMENT   Patient Name: Denise Cline MRN: [id number] DOB:08/07/1940, 83 y.o., female Today's Date: 10/11/2023   PCP: Campbell Reynolds, NP REFERRING PROVIDER: Leigh Venetia LITTIE, MD  END OF SESSION:  PT End of Session - 10/11/23 1104     Visit Number 8    Number of Visits 13   Plus eval   Date for PT Re-Evaluation 10/28/23    Authorization Type [employer] Medicare    Authorization Time Period 09/09/23 - 10/21/23 12 PT visits    PT Start Time 1102    PT Stop Time 1148    PT Time Calculation (min) 46 min    Equipment Utilized During Treatment Gait belt    Activity Tolerance Patient tolerated treatment well    Behavior During Therapy WFL for tasks assessed/performed               Past Medical History:  Diagnosis Date   Acute colitis    Anxiety    B12 deficiency    Bilateral swelling of feet    Constipation    COPD (chronic obstructive pulmonary disease) (HCC)    Depression    DVT (deep venous thrombosis) (HCC) 2014, 2017   Fibroid    Gallbladder problem    Heart valve problem    High cholesterol    History of shingles    Hypertension    IBS (irritable bowel syndrome)    Joint pain    PE (pulmonary thromboembolism) (HCC) 2017   SOB (shortness of breath)    Thyroid  disease    Nodules   Varicose veins    Vitamin D  deficiency    Past Surgical History:  Procedure Laterality Date   ABDOMINAL HYSTERECTOMY     CHOLECYSTECTOMY N/A 11/05/2017   Procedure: LAPAROSCOPIC CHOLECYSTECTOMY WITH INTRAOPERATIVE CHOLANGIOGRAM;  Surgeon: Curvin Deward MOULD, MD;  Location: [facility] OR;  Service: General;  Laterality: N/A;   COLONOSCOPY N/A 10/05/2014   Procedure: COLONOSCOPY;  Surgeon: Lamar Bunk, MD;  Location: [facility] ENDOSCOPY;  Service: Endoscopy;  Laterality: N/A;   ERCP N/A 11/04/2017   Procedure: ENDOSCOPIC RETROGRADE CHOLANGIOPANCREATOGRAPHY (ERCP) Balloon Extraction;  Surgeon: Saintclair Jasper, MD;  Location: [facility] ENDOSCOPY;  Service: Gastroenterology;  Laterality:  N/A;   LAPAROSCOPIC CHOLECYSTECTOMY W/ CHOLANGIOGRAPHY  11/05/2017   Dr. Curvin   OOPHORECTOMY     One ovary removed   SPHINCTEROTOMY  11/04/2017   Procedure: SPHINCTEROTOMY;  Surgeon: Saintclair Jasper, MD;  Location: [facility] ENDOSCOPY;  Service: Gastroenterology;;   TOTAL THYROIDECTOMY     Patient Active Problem List   Diagnosis Date Noted   Choledocholithiasis with acute cholecystitis with obstruction 11/02/2017   Chronic kidney disease, stage 3 (HCC) 11/02/2017   DVT (deep venous thrombosis) (HCC) 11/12/2015   Pulmonary emboli (HCC) 11/11/2015   Acute pulmonary embolism (HCC) 11/11/2015   Coronary artery calcification seen on CAT scan 05/12/2015   Thoracic aortic aneurysm (HCC) 05/12/2015   Dyspnea 03/17/2015   Left leg swelling 03/16/2015   Acute colitis 10/04/2014   COPD (chronic obstructive pulmonary disease) (HCC)    PVD (peripheral vascular disease) (HCC) 04/01/2012   Leg pain 04/01/2012   Swelling of limb 04/01/2012   History of shingles    Hypertension    Fibroid    Thyroid  disease    Anxiety     ONSET DATE: 08/23/2023 (referral)   REFERRING DIAG: R26.89 (ICD-10-CM) - Imbalance R29.6 (ICD-10-CM) - Falls  THERAPY DIAG:  Unsteadiness on feet  Other abnormalities of gait and mobility  Muscle weakness (generalized)  History of falling  Rationale for Evaluation and Treatment: Rehabilitation  SUBJECTIVE:                                                                                                                                                                                             SUBJECTIVE STATEMENT:  Pt reports that her stomach has been upset this morning. Pt did call a doctor (PCP) on Monday that is friends with her son, they are within the [facility], scheduled to see them 8/19.  Pt denies any falls since last visit but reports she has to fight Thom Thom (her dog) as he tries to trip her. Pt reports that when she loses her balance she tends to fall  forwards.   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 No  PRECAUTIONS: Fall  RED FLAGS: None   WEIGHT BEARING RESTRICTIONS: No  FALLS: Has patient fallen in last 6 months? Yes. Number of falls 1  LIVING ENVIRONMENT: Lives with: lives alone Lives in: House/apartment Stairs: Yes: External: 3 steps; on right going up, on left going up, and can reach both Has following equipment at home: Single point cane, shower chair, Grab bars, and has to put shower chair and grab bars together, no installed currently   PLOF: Independent  PATIENT GOALS: I need my balance back and to be able to walk better    OBJECTIVE:  Note: Objective measures were completed at Evaluation unless otherwise noted.  DIAGNOSTIC FINDINGS: CT of head from 08/2022  IMPRESSION: 1. No CT evidence for acute intracranial abnormality. 2. Atrophy and chronic small vessel ischemic changes of the white matter.  COGNITION: Overall cognitive status: Difficulty to assess due to: no family present   SENSATION: Pt reports impaired sensation in bilateral feet and hands    EDEMA: Chronic swelling in LLE - history of DVT in 2019     POSTURE: rounded shoulders, forward head, and increased thoracic kyphosis  LOWER EXTREMITY ROM:     Active  Right Eval Left Eval  Hip flexion    Hip extension    Hip abduction    Hip adduction    Hip internal rotation    Hip external rotation    Knee flexion    Knee extension    Ankle dorsiflexion    Ankle plantarflexion    Ankle inversion    Ankle eversion     (Blank rows = not tested)  LOWER EXTREMITY  MMT:  Tested in seated position   MMT Right Eval Left Eval  Hip flexion 3+ 3+  Hip extension    Hip abduction 4 4  Hip adduction 3+ 3+  Hip internal rotation    Hip  external rotation    Knee flexion 4- 4-  Knee extension 4- 4-  Ankle dorsiflexion 4 4  Ankle plantarflexion    Ankle inversion    Ankle eversion    (Blank rows = not tested)  BED MOBILITY:  Not tested  TRANSFERS: Sit to stand: SBA  Assistive device utilized: None     Stand to sit: SBA  Assistive device utilized: None      RAMP:  Not tested  CURB:  Not tested  STAIRS: Not tested GAIT: Gait pattern: step through pattern, decreased arm swing- Right, decreased arm swing- Left, decreased stride length, decreased hip/knee flexion- Right, decreased hip/knee flexion- Left, lateral hip instability, decreased trunk rotation, wide BOS, and poor foot clearance- Left Distance walked: Various clinic distances  Assistive device utilized: None Level of assistance: Modified independence Comments: Pt ambulates w/wide BOS and waddle-like pattern. Decreased cadence. No LOB noted    FUNCTIONAL TESTS:   [facility] PT Assessment - 10/11/23 1113       Functional Gait  Assessment   Gait assessed  Yes    Gait Level Surface Walks 20 ft in less than 7 sec but greater than 5.5 sec, uses assistive device, slower speed, mild gait deviations, or deviates 6-10 in outside of the 12 in walkway width.    Change in Gait Speed Able to change speed, demonstrates mild gait deviations, deviates 6-10 in outside of the 12 in walkway width, or no gait deviations, unable to achieve a major change in velocity, or uses a change in velocity, or uses an assistive device.    Gait with Horizontal Head Turns Performs head turns smoothly with slight change in gait velocity (eg, minor disruption to smooth gait path), deviates 6-10 in outside 12 in walkway width, or uses an assistive device.    Gait with Vertical Head Turns Performs task with moderate change in gait velocity, slows down, deviates 10-15 in outside 12 in walkway width but recovers, can continue to walk.    Gait and Pivot Turn Pivot turns safely within 3 sec and stops  quickly with no loss of balance.    Step Over Obstacle Is able to step over one shoe box (4.5 in total height) without changing gait speed. No evidence of imbalance.    Gait with Narrow Base of Support Ambulates less than 4 steps heel to toe or cannot perform without assistance.    Gait with Eyes Closed Walks 20 ft, slow speed, abnormal gait pattern, evidence for imbalance, deviates 10-15 in outside 12 in walkway width. Requires more than 9 sec to ambulate 20 ft.    Ambulating Backwards Walks 20 ft, slow speed, abnormal gait pattern, evidence for imbalance, deviates 10-15 in outside 12 in walkway width.    Steps Two feet to a stair, must use rail.    Total Score 15    FGA comment: 15/30, high fall risk              VITALS  Vitals:   10/11/23 1112  BP: 103/73  Pulse: 71  TREATMENT:   Self-care/home management  Assessed vitals (see above) in LUE at rest and lower than normal for patient, [facility] for session.  Ther Act  To work on ankle strategy and static standing balance: In // bars with no UE support: Static stance on rockerboard in A/P direction 3 x 30 sec Added in 2# weighted dowel 3 x 5 reps Pt tends to lose her balance posteriorly    Physical Performance For STG assessment  [facility] PT Assessment - 10/11/23 1113       Functional Gait  Assessment   Gait assessed  Yes    Gait Level Surface Walks 20 ft in less than 7 sec but greater than 5.5 sec, uses assistive device, slower speed, mild gait deviations, or deviates 6-10 in outside of the 12 in walkway width.    Change in Gait Speed Able to change speed, demonstrates mild gait deviations, deviates 6-10 in outside of the 12 in walkway width, or no gait deviations, unable to achieve a major change in velocity, or uses a change in velocity, or uses an assistive device.    Gait with Horizontal Head  Turns Performs head turns smoothly with slight change in gait velocity (eg, minor disruption to smooth gait path), deviates 6-10 in outside 12 in walkway width, or uses an assistive device.    Gait with Vertical Head Turns Performs task with moderate change in gait velocity, slows down, deviates 10-15 in outside 12 in walkway width but recovers, can continue to walk.    Gait and Pivot Turn Pivot turns safely within 3 sec and stops quickly with no loss of balance.    Step Over Obstacle Is able to step over one shoe box (4.5 in total height) without changing gait speed. No evidence of imbalance.    Gait with Narrow Base of Support Ambulates less than 4 steps heel to toe or cannot perform without assistance.    Gait with Eyes Closed Walks 20 ft, slow speed, abnormal gait pattern, evidence for imbalance, deviates 10-15 in outside 12 in walkway width. Requires more than 9 sec to ambulate 20 ft.    Ambulating Backwards Walks 20 ft, slow speed, abnormal gait pattern, evidence for imbalance, deviates 10-15 in outside 12 in walkway width.    Steps Two feet to a stair, must use rail.    Total Score 15    FGA comment: 15/30, high fall risk             PATIENT EDUCATION: Education details: Continue HEP, results of FGA and functional implications Person educated: Patient Education method: Explanation and Demonstration Education comprehension: verbalized understanding, returned demonstration, and needs further education  HOME EXERCISE PROGRAM: Access Code: [email or web address] URL: [email or web address] Date: 09/20/2023 Prepared by: Marlon Plaster  Exercises - Seated march over  - 1 x daily - 7 x weekly - 3 sets - 10-15 reps - Side Stepping with Resistance at Ankles and Counter Support  - 1 x daily - 7 x weekly - 2-5 reps  GOALS: Goals reviewed with patient? Yes  SHORT TERM GOALS: Target date: 10/07/2023   Pt will be independent with initial HEP for improved strength, balance, transfers and  gait.  Baseline: not established on eval  Goal status: MET  2.  Pt will improve 5 x STS to less than or equal to 25 seconds to demonstrate improved functional strength and transfer efficiency.   Baseline: 30.16s w/BUE support; 19.03s w/intermittent UE support (8/4) Goal status: MET  3.  Pt will improve gait  velocity to at least 1.0 m/s for improved gait efficiency and independence    Baseline: 0.91 m/s no AD; 0.96 m/s no AD (8/4)  Goal status: NOT MET  4.  Pt will trial various AD to determine safest option for pt for reduced fall risk  Baseline: pt has SPC that she has not used  Goal status: DC  5.  FGA to be assessed and LTG updated  Baseline: 15/30 (8/8) Goal status: MET  LONG TERM GOALS: Target date: 10/21/2023   Pt will be independent with final HEP for improved strength, balance, transfers and gait.  Baseline:  Goal status: INITIAL  2.  Pt will improve 5 x STS to less than or equal to 17 seconds to demonstrate improved functional strength and transfer efficiency.   Baseline: 30.16s w/BUE support; 19.03s w/intermittent UE support (8/4) Goal status: REVISED  3.  Pt will improve gait velocity to at least 1.2 m/s for improved gait efficiency and independence   Baseline: 0.91 m/s no AD (7/18); 0.96 m/s no AD (8/4)  Goal status: REVISED  4.  Pt will improve FGA to 19/30 for decreased fall risk   Baseline: 15/30 (8/8) Goal status: INITIAL  5.  Pt will perform floor transfer mod I for safe fall recovery and proper body mechanics  Baseline:  Goal status: INITIAL   ASSESSMENT:  CLINICAL IMPRESSION: Emphasis of skilled PT session on continuing to assess vitals, assessing FGA, and working on ankle strategy and static standing balance. Pt's BP lower this session as compared to previous sessions and she did schedule to see a new PCP later this month. She is a high fall risk based on her score on the FGA today with the most difficulty performing gait with eyes closed,  tandem gait, and stair navigation as well as with difficulty following directions and attending to tasks. She reports that she tends to lose her balance anteriorly but during standing balance tasks this session tends to lose her balance posteriorly. She continues to benefit from skilled PT services to work towards improving her balance and her functional strength in order to decrease her fall risk. Continue POC.    OBJECTIVE IMPAIRMENTS: Abnormal gait, decreased activity tolerance, decreased balance, decreased cognition, decreased endurance, decreased knowledge of condition, decreased knowledge of use of DME, decreased mobility, difficulty walking, decreased strength, decreased safety awareness, increased edema, impaired sensation, improper body mechanics, and pain  ACTIVITY LIMITATIONS: carrying, lifting, bending, standing, squatting, stairs, transfers, hygiene/grooming, locomotion level, and caring for others  PARTICIPATION LIMITATIONS: meal prep, cleaning, laundry, medication management, interpersonal relationship, driving, shopping, community activity, and yard work  PERSONAL FACTORS: Age, Fitness, Past/current experiences, Transportation, and 1-2 comorbidities: mixed connective tissue disease and polyneuropathy are also affecting patient's functional outcome.   REHAB POTENTIAL: Good  CLINICAL DECISION MAKING: Evolving/moderate complexity  EVALUATION COMPLEXITY: Moderate  PLAN:  PT FREQUENCY: 2x/week  PT DURATION: 6 weeks  PLANNED INTERVENTIONS: [id number]- PT Re-evaluation, 97750- Physical Performance Testing, 97110-Therapeutic exercises, 97530- Therapeutic activity, [id number]- Neuromuscular re-education, 97535- Self Care, [id number]- Manual therapy, [id number]- Gait training, [phone]- Canalith repositioning, [id number]- Aquatic Therapy, [phone]- Electrical stimulation (manual), [phone] (1-2 muscles), [id number] (3+ muscles)- Dry Needling, Patient/Family education, Balance training, Stair training, Joint mobilization,  Spinal mobilization, Vestibular training, and DME instructions  PLAN FOR NEXT SESSION: Watch BP. Did she call PCP-yes, scheduled 8/19, Add to HEP for BLE and hip strength (sit to stands, hip 4-way, march overs, bridges). Scifit for endurance. Posterior chain strength and single leg stability. Blaze pods, rockerboard  Ary Lavine, PT [place], PT, DPT, CSRS   10/11/2023, 11:49 AM

## 2023-10-14 ENCOUNTER — Ambulatory Visit: Admitting: Physical Therapy

## 2023-10-14 VITALS — BP 137/106 | HR 63

## 2023-10-14 DIAGNOSIS — R2681 Unsteadiness on feet: Secondary | ICD-10-CM | POA: Diagnosis not present

## 2023-10-14 DIAGNOSIS — R2689 Other abnormalities of gait and mobility: Secondary | ICD-10-CM

## 2023-10-14 DIAGNOSIS — M6281 Muscle weakness (generalized): Secondary | ICD-10-CM

## 2023-10-14 NOTE — Therapy (Signed)
 OUTPATIENT PHYSICAL THERAPY NEURO TREATMENT   Patient Name: Denise Cline MRN: [id number] DOB:02-Jul-1940, 83 y.o., female Today's Date: 10/14/2023   PCP: Campbell Reynolds, NP REFERRING PROVIDER: Leigh Venetia LITTIE, MD  END OF SESSION:  PT End of Session - 10/14/23 1105     Visit Number 9    Number of Visits 13   Plus eval   Date for PT Re-Evaluation 10/28/23    Authorization Type [employer] Medicare    Authorization Time Period 09/09/23 - 10/21/23 12 PT visits    PT Start Time 1103    PT Stop Time 1119   HTN   PT Time Calculation (min) 16 min    Equipment Utilized During Treatment --    Activity Tolerance Treatment limited secondary to medical complications (Comment)   diastolic HTN   Behavior During Therapy [facility] for tasks assessed/performed               Past Medical History:  Diagnosis Date   Acute colitis    Anxiety    B12 deficiency    Bilateral swelling of feet    Constipation    COPD (chronic obstructive pulmonary disease) (HCC)    Depression    DVT (deep venous thrombosis) (HCC) 2014, 2017   Fibroid    Gallbladder problem    Heart valve problem    High cholesterol    History of shingles    Hypertension    IBS (irritable bowel syndrome)    Joint pain    PE (pulmonary thromboembolism) (HCC) 2017   SOB (shortness of breath)    Thyroid  disease    Nodules   Varicose veins    Vitamin D  deficiency    Past Surgical History:  Procedure Laterality Date   ABDOMINAL HYSTERECTOMY     CHOLECYSTECTOMY N/A 11/05/2017   Procedure: LAPAROSCOPIC CHOLECYSTECTOMY WITH INTRAOPERATIVE CHOLANGIOGRAM;  Surgeon: Curvin Deward MOULD, MD;  Location: [facility] OR;  Service: General;  Laterality: N/A;   COLONOSCOPY N/A 10/05/2014   Procedure: COLONOSCOPY;  Surgeon: Lamar Bunk, MD;  Location: [facility] ENDOSCOPY;  Service: Endoscopy;  Laterality: N/A;   ERCP N/A 11/04/2017   Procedure: ENDOSCOPIC RETROGRADE CHOLANGIOPANCREATOGRAPHY (ERCP) Balloon Extraction;  Surgeon: Saintclair Jasper, MD;  Location: [facility]  ENDOSCOPY;  Service: Gastroenterology;  Laterality: N/A;   LAPAROSCOPIC CHOLECYSTECTOMY W/ CHOLANGIOGRAPHY  11/05/2017   Dr. Curvin   OOPHORECTOMY     One ovary removed   SPHINCTEROTOMY  11/04/2017   Procedure: SPHINCTEROTOMY;  Surgeon: Saintclair Jasper, MD;  Location: [facility] ENDOSCOPY;  Service: Gastroenterology;;   TOTAL THYROIDECTOMY     Patient Active Problem List   Diagnosis Date Noted   Choledocholithiasis with acute cholecystitis with obstruction 11/02/2017   Chronic kidney disease, stage 3 (HCC) 11/02/2017   DVT (deep venous thrombosis) (HCC) 11/12/2015   Pulmonary emboli (HCC) 11/11/2015   Acute pulmonary embolism (HCC) 11/11/2015   Coronary artery calcification seen on CAT scan 05/12/2015   Thoracic aortic aneurysm (HCC) 05/12/2015   Dyspnea 03/17/2015   Left leg swelling 03/16/2015   Acute colitis 10/04/2014   COPD (chronic obstructive pulmonary disease) (HCC)    PVD (peripheral vascular disease) (HCC) 04/01/2012   Leg pain 04/01/2012   Swelling of limb 04/01/2012   History of shingles    Hypertension    Fibroid    Thyroid  disease    Anxiety     ONSET DATE: 08/23/2023 (referral)   REFERRING DIAG: R26.89 (ICD-10-CM) - Imbalance R29.6 (ICD-10-CM) - Falls  THERAPY DIAG:  Unsteadiness on feet  Other abnormalities of gait  and mobility  Muscle weakness (generalized)  Rationale for Evaluation and Treatment: Rehabilitation  SUBJECTIVE:                                                                                                                                                                                             SUBJECTIVE STATEMENT:  Pt reports she feels good today. Put some Vicks on her neck last night and then her pain went away. Saw this from [employer].   Pt denies any falls since last visit but  not because Thom Thom did not try. HEP is going well. I had a come to Jesus moment.    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 No  PRECAUTIONS: Fall  RED FLAGS: None   WEIGHT BEARING RESTRICTIONS: No  FALLS: Has patient fallen in last 6 months? Yes. Number of falls 1  LIVING ENVIRONMENT: Lives with: lives alone Lives in: House/apartment Stairs: Yes: External: 3 steps; on right going up, on left going up, and can reach both Has following equipment at home: Single point cane, shower chair, Grab bars, and has to put shower chair and grab bars together, no installed currently   PLOF: Independent  PATIENT GOALS: I need my balance back and to be able to walk better    OBJECTIVE:  Note: Objective measures were completed at Evaluation unless otherwise noted.  DIAGNOSTIC FINDINGS: CT of head from 08/2022  IMPRESSION: 1. No CT evidence for acute intracranial abnormality. 2. Atrophy and chronic small vessel ischemic changes of the white matter.  COGNITION: Overall cognitive status: Difficulty to assess due to: no family present   SENSATION: Pt reports impaired sensation in bilateral feet and hands    EDEMA: Chronic swelling in LLE - history of DVT in 2019     POSTURE: rounded shoulders, forward head, and increased thoracic kyphosis  LOWER EXTREMITY ROM:     Active  Right Eval Left Eval  Hip flexion    Hip extension    Hip abduction    Hip adduction    Hip internal rotation    Hip external rotation    Knee flexion    Knee extension    Ankle dorsiflexion    Ankle plantarflexion    Ankle inversion    Ankle eversion     (Blank rows = not tested)  LOWER EXTREMITY MMT:  Tested in seated position   MMT Right Eval Left Eval  Hip flexion  3+ 3+  Hip extension    Hip abduction 4 4  Hip adduction 3+ 3+  Hip internal rotation    Hip external rotation    Knee flexion 4- 4-  Knee extension 4- 4-  Ankle dorsiflexion 4 4   Ankle plantarflexion    Ankle inversion    Ankle eversion    (Blank rows = not tested)  BED MOBILITY:  Not tested  TRANSFERS: Sit to stand: SBA  Assistive device utilized: None     Stand to sit: SBA  Assistive device utilized: None      RAMP:  Not tested  CURB:  Not tested  STAIRS: Not tested GAIT: Gait pattern: step through pattern, decreased arm swing- Right, decreased arm swing- Left, decreased stride length, decreased hip/knee flexion- Right, decreased hip/knee flexion- Left, lateral hip instability, decreased trunk rotation, wide BOS, and poor foot clearance- Left Distance walked: Various clinic distances  Assistive device utilized: None Level of assistance: Modified independence Comments: Pt ambulates w/wide BOS and waddle-like pattern. Decreased cadence. No LOB noted    FUNCTIONAL TESTS:         VITALS  Vitals:   10/14/23 1110 10/14/23 1113  BP: (!) 156/97 (!) 137/106  Pulse: 61 63                                                                                                                                   TREATMENT:   Self-care/home management  Assessed vitals (see above) in LUE at rest and diastolic BP elevated beyond safe limits for session. Suspect stress is playing a large role in this. Strongly encouraged pt to call Behavioral health today and mail in her paperwork to get established with a counselor, which pt reports she will do today prior to leaving for [place].     PATIENT EDUCATION: Education details: See above Person educated: Patient Education method: Medical illustrator Education comprehension: verbalized understanding, returned demonstration, and needs further education  HOME EXERCISE PROGRAM: Access Code: [email or web address] URL: [email or web address] Date: 09/20/2023 Prepared by: Marlon Everlee Quakenbush  Exercises - Seated march over  - 1 x daily - 7 x weekly - 3 sets - 10-15 reps - Side Stepping with Resistance at  Ankles and Counter Support  - 1 x daily - 7 x weekly - 2-5 reps  GOALS: Goals reviewed with patient? Yes  SHORT TERM GOALS: Target date: 10/07/2023   Pt will be independent with initial HEP for improved strength, balance, transfers and gait.  Baseline: not established on eval  Goal status: MET  2.  Pt will improve 5 x STS to less than or equal to 25 seconds to demonstrate improved functional strength and transfer efficiency.   Baseline: 30.16s w/BUE support; 19.03s w/intermittent UE support (8/4) Goal status: MET  3.  Pt will improve gait velocity to at least 1.0 m/s for improved gait efficiency and independence    Baseline: 0.91 m/s no AD; 0.96 m/s  no AD (8/4)  Goal status: NOT MET  4.  Pt will trial various AD to determine safest option for pt for reduced fall risk  Baseline: pt has SPC that she has not used  Goal status: DC  5.  FGA to be assessed and LTG updated  Baseline: 15/30 (8/8) Goal status: MET  LONG TERM GOALS: Target date: 10/21/2023   Pt will be independent with final HEP for improved strength, balance, transfers and gait.  Baseline:  Goal status: INITIAL  2.  Pt will improve 5 x STS to less than or equal to 17 seconds to demonstrate improved functional strength and transfer efficiency.   Baseline: 30.16s w/BUE support; 19.03s w/intermittent UE support (8/4) Goal status: REVISED  3.  Pt will improve gait velocity to at least 1.2 m/s for improved gait efficiency and independence   Baseline: 0.91 m/s no AD (7/18); 0.96 m/s no AD (8/4)  Goal status: REVISED  4.  Pt will improve FGA to 19/30 for decreased fall risk   Baseline: 15/30 (8/8) Goal status: INITIAL  5.  Pt will perform floor transfer mod I for safe fall recovery and proper body mechanics  Baseline:  Goal status: INITIAL   ASSESSMENT:  CLINICAL IMPRESSION: Session limited due to diastolic BP which was above limits for session. Pt under a lot of stress and reports she just found out she is  to watch her sister-in -law by herself for a week and was not notified of this prior. Pt strongly encouraged to mail in her paperwork to behavioral health to get established with a counselor to assist with stress management. Pt has appointment w/PCP on 8/19 for further BP management as well. Continue POC.    OBJECTIVE IMPAIRMENTS: Abnormal gait, decreased activity tolerance, decreased balance, decreased cognition, decreased endurance, decreased knowledge of condition, decreased knowledge of use of DME, decreased mobility, difficulty walking, decreased strength, decreased safety awareness, increased edema, impaired sensation, improper body mechanics, and pain  ACTIVITY LIMITATIONS: carrying, lifting, bending, standing, squatting, stairs, transfers, hygiene/grooming, locomotion level, and caring for others  PARTICIPATION LIMITATIONS: meal prep, cleaning, laundry, medication management, interpersonal relationship, driving, shopping, community activity, and yard work  PERSONAL FACTORS: Age, Fitness, Past/current experiences, Transportation, and 1-2 comorbidities: mixed connective tissue disease and polyneuropathy are also affecting patient's functional outcome.   REHAB POTENTIAL: Good  CLINICAL DECISION MAKING: Evolving/moderate complexity  EVALUATION COMPLEXITY: Moderate  PLAN:  PT FREQUENCY: 2x/week  PT DURATION: 6 weeks  PLANNED INTERVENTIONS: 97164- PT Re-evaluation, 97750- Physical Performance Testing, 97110-Therapeutic exercises, 97530- Therapeutic activity, [id number]- Neuromuscular re-education, 97535- Self Care, 02859- Manual therapy, [id number]- Gait training, [phone]- Canalith repositioning, [id number]- Aquatic Therapy, [phone]- Electrical stimulation (manual), [phone] (1-2 muscles), [phone] (3+ muscles)- Dry Needling, Patient/Family education, Balance training, Stair training, Joint mobilization, Spinal mobilization, Vestibular training, and DME instructions  PLAN FOR NEXT SESSION: 10th visit PN. Watch BP.  Did she mail in paperwork to [facility]? Add to HEP for BLE and hip strength (sit to stands, hip 4-way, march overs, bridges). Scifit for endurance. Posterior chain strength and single leg stability. Blaze pods, rockerboard   Marlon BRAVO Emelia Sandoval, PT, DPT   10/14/2023, 11:30 AM

## 2023-10-18 ENCOUNTER — Ambulatory Visit: Admitting: Physical Therapy

## 2023-10-18 NOTE — Therapy (Incomplete)
 OUTPATIENT PHYSICAL THERAPY NEURO TREATMENT - 10th VISIT PROGRESS NOTE***   Patient Name: Denise Cline MRN: [id number] DOB:April 05, 1940, 83 y.o., female Today's Date: 10/18/2023   PCP: Campbell Reynolds, NP REFERRING PROVIDER: Leigh Venetia LITTIE, MD  Physical Therapy Progress Note   Dates of Reporting Period:*** - 10/18/2023  See Note below for Objective Data and Assessment of Progress/Goals.  Thank you for the referral of this patient. Waddell Southgate, PT, DPT, CSRS   END OF SESSION:         Past Medical History:  Diagnosis Date   Acute colitis    Anxiety    B12 deficiency    Bilateral swelling of feet    Constipation    COPD (chronic obstructive pulmonary disease) (HCC)    Depression    DVT (deep venous thrombosis) (HCC) 2014, 2017   Fibroid    Gallbladder problem    Heart valve problem    High cholesterol    History of shingles    Hypertension    IBS (irritable bowel syndrome)    Joint pain    PE (pulmonary thromboembolism) (HCC) 2017   SOB (shortness of breath)    Thyroid  disease    Nodules   Varicose veins    Vitamin D  deficiency    Past Surgical History:  Procedure Laterality Date   ABDOMINAL HYSTERECTOMY     CHOLECYSTECTOMY N/A 11/05/2017   Procedure: LAPAROSCOPIC CHOLECYSTECTOMY WITH INTRAOPERATIVE CHOLANGIOGRAM;  Surgeon: Curvin Deward MOULD, MD;  Location: [facility] OR;  Service: General;  Laterality: N/A;   COLONOSCOPY N/A 10/05/2014   Procedure: COLONOSCOPY;  Surgeon: Lamar Bunk, MD;  Location: [facility] ENDOSCOPY;  Service: Endoscopy;  Laterality: N/A;   ERCP N/A 11/04/2017   Procedure: ENDOSCOPIC RETROGRADE CHOLANGIOPANCREATOGRAPHY (ERCP) Balloon Extraction;  Surgeon: Saintclair Jasper, MD;  Location: [facility] ENDOSCOPY;  Service: Gastroenterology;  Laterality: N/A;   LAPAROSCOPIC CHOLECYSTECTOMY W/ CHOLANGIOGRAPHY  11/05/2017   Dr. Curvin   OOPHORECTOMY     One ovary removed   SPHINCTEROTOMY  11/04/2017   Procedure: SPHINCTEROTOMY;  Surgeon: Saintclair Jasper, MD;  Location: [facility]  ENDOSCOPY;  Service: Gastroenterology;;   TOTAL THYROIDECTOMY     Patient Active Problem List   Diagnosis Date Noted   Choledocholithiasis with acute cholecystitis with obstruction 11/02/2017   Chronic kidney disease, stage 3 (HCC) 11/02/2017   DVT (deep venous thrombosis) (HCC) 11/12/2015   Pulmonary emboli (HCC) 11/11/2015   Acute pulmonary embolism (HCC) 11/11/2015   Coronary artery calcification seen on CAT scan 05/12/2015   Thoracic aortic aneurysm (HCC) 05/12/2015   Dyspnea 03/17/2015   Left leg swelling 03/16/2015   Acute colitis 10/04/2014   COPD (chronic obstructive pulmonary disease) (HCC)    PVD (peripheral vascular disease) (HCC) 04/01/2012   Leg pain 04/01/2012   Swelling of limb 04/01/2012   History of shingles    Hypertension    Fibroid    Thyroid  disease    Anxiety     ONSET DATE: 08/23/2023 (referral)   REFERRING DIAG: R26.89 (ICD-10-CM) - Imbalance R29.6 (ICD-10-CM) - Falls  THERAPY DIAG:  No diagnosis found.  Rationale for Evaluation and Treatment: Rehabilitation  SUBJECTIVE:  SUBJECTIVE STATEMENT:  Pt reports she feels good today. Put some Vicks on her neck last night and then her pain went away. Saw this from [employer].   Pt denies any falls since last visit but  not because Thom Thom did not try. HEP is going well. I had a come to Jesus moment.   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 No  PRECAUTIONS: Fall  RED FLAGS: None   WEIGHT BEARING RESTRICTIONS: No  FALLS: Has patient fallen in last 6 months? Yes. Number of falls 1  LIVING  ENVIRONMENT: Lives with: lives alone Lives in: House/apartment Stairs: Yes: External: 3 steps; on right going up, on left going up, and can reach both Has following equipment at home: Single point cane, shower chair, Grab bars, and has to put shower chair and grab bars together, no installed currently   PLOF: Independent  PATIENT GOALS: I need my balance back and to be able to walk better    OBJECTIVE:  Note: Objective measures were completed at Evaluation unless otherwise noted.  DIAGNOSTIC FINDINGS: CT of head from 08/2022  IMPRESSION: 1. No CT evidence for acute intracranial abnormality. 2. Atrophy and chronic small vessel ischemic changes of the white matter.  COGNITION: Overall cognitive status: Difficulty to assess due to: no family present   SENSATION: Pt reports impaired sensation in bilateral feet and hands    EDEMA: Chronic swelling in LLE - history of DVT in 2019     POSTURE: rounded shoulders, forward head, and increased thoracic kyphosis  LOWER EXTREMITY ROM:     Active  Right Eval Left Eval  Hip flexion    Hip extension    Hip abduction    Hip adduction    Hip internal rotation    Hip external rotation    Knee flexion    Knee extension    Ankle dorsiflexion    Ankle plantarflexion    Ankle inversion    Ankle eversion     (Blank rows = not tested)  LOWER EXTREMITY MMT:  Tested in seated position   MMT Right Eval Left Eval  Hip flexion 3+ 3+  Hip extension    Hip abduction 4 4  Hip adduction 3+ 3+  Hip internal rotation    Hip external rotation    Knee flexion 4- 4-  Knee extension 4- 4-  Ankle dorsiflexion 4 4  Ankle plantarflexion    Ankle inversion    Ankle eversion    (Blank rows = not tested)  BED MOBILITY:  Not tested  TRANSFERS: Sit to stand: SBA  Assistive device utilized: None     Stand to sit: SBA  Assistive device utilized: None      RAMP:  Not tested  CURB:  Not tested  STAIRS: Not tested GAIT: Gait  pattern: step through pattern, decreased arm swing- Right, decreased arm swing- Left, decreased stride length, decreased hip/knee flexion- Right, decreased hip/knee flexion- Left, lateral hip instability, decreased trunk rotation, wide BOS, and poor foot clearance- Left Distance walked: Various clinic distances  Assistive device utilized: None Level of assistance: Modified independence Comments: Pt ambulates w/wide BOS and waddle-like pattern. Decreased cadence. No LOB noted    FUNCTIONAL TESTS:         VITALS  There were no vitals filed for  this visit.                                                                                                                                  TREATMENT:   Self-care/home management  Assessed vitals (see above) in LUE at rest and diastolic BP elevated beyond safe limits for session. Suspect stress is playing a large role in this. Strongly encouraged pt to call Behavioral health today and mail in her paperwork to get established with a counselor, which pt reports she will do today prior to leaving for [place].  ***    PATIENT EDUCATION: Education details: See above Person educated: Patient Education method: Medical illustrator Education comprehension: verbalized understanding, returned demonstration, and needs further education  HOME EXERCISE PROGRAM: Access Code: [email or web address] URL: [email or web address] Date: 09/20/2023 Prepared by: Marlon Plaster  Exercises - Seated march over  - 1 x daily - 7 x weekly - 3 sets - 10-15 reps - Side Stepping with Resistance at Ankles and Counter Support  - 1 x daily - 7 x weekly - 2-5 reps  GOALS: Goals reviewed with patient? Yes  SHORT TERM GOALS: Target date: 10/07/2023   Pt will be independent with initial HEP for improved strength, balance, transfers and gait.  Baseline: not established on eval  Goal status: MET  2.  Pt will improve 5 x STS to less than or equal to 25  seconds to demonstrate improved functional strength and transfer efficiency.   Baseline: 30.16s w/BUE support; 19.03s w/intermittent UE support (8/4) Goal status: MET  3.  Pt will improve gait velocity to at least 1.0 m/s for improved gait efficiency and independence    Baseline: 0.91 m/s no AD; 0.96 m/s no AD (8/4)  Goal status: NOT MET  4.  Pt will trial various AD to determine safest option for pt for reduced fall risk  Baseline: pt has SPC that she has not used  Goal status: DC  5.  FGA to be assessed and LTG updated  Baseline: 15/30 (8/8) Goal status: MET  LONG TERM GOALS: Target date: 10/21/2023   Pt will be independent with final HEP for improved strength, balance, transfers and gait.  Baseline:  Goal status: INITIAL  2.  Pt will improve 5 x STS to less than or equal to 17 seconds to demonstrate improved functional strength and transfer efficiency.   Baseline: 30.16s w/BUE support; 19.03s w/intermittent UE support (8/4) Goal status: REVISED  3.  Pt will improve gait velocity to at least 1.2 m/s for improved gait efficiency and independence   Baseline: 0.91 m/s no AD (7/18); 0.96 m/s no AD (8/4)  Goal status: REVISED  4.  Pt will improve FGA to 19/30 for decreased fall risk   Baseline: 15/30 (8/8) Goal status: INITIAL  5.  Pt will perform floor transfer mod I for safe fall recovery and proper body mechanics  Baseline:  Goal status: INITIAL   ASSESSMENT:  CLINICAL IMPRESSION: Session limited due to diastolic BP which was above limits for session. Pt under a lot of stress and reports she just found out she is to watch her sister-in -law by herself for a week and was not notified of this prior. Pt strongly encouraged to mail in her paperwork to behavioral health to get established with a counselor to assist with stress management. Pt has appointment w/PCP on 8/19 for further BP management as well.*** Continue POC.    OBJECTIVE IMPAIRMENTS: Abnormal gait,  decreased activity tolerance, decreased balance, decreased cognition, decreased endurance, decreased knowledge of condition, decreased knowledge of use of DME, decreased mobility, difficulty walking, decreased strength, decreased safety awareness, increased edema, impaired sensation, improper body mechanics, and pain  ACTIVITY LIMITATIONS: carrying, lifting, bending, standing, squatting, stairs, transfers, hygiene/grooming, locomotion level, and caring for others  PARTICIPATION LIMITATIONS: meal prep, cleaning, laundry, medication management, interpersonal relationship, driving, shopping, community activity, and yard work  PERSONAL FACTORS: Age, Fitness, Past/current experiences, Transportation, and 1-2 comorbidities: mixed connective tissue disease and polyneuropathy are also affecting patient's functional outcome.   REHAB POTENTIAL: Good  CLINICAL DECISION MAKING: Evolving/moderate complexity  EVALUATION COMPLEXITY: Moderate  PLAN:  PT FREQUENCY: 2x/week  PT DURATION: 6 weeks  PLANNED INTERVENTIONS: 97164- PT Re-evaluation, 97750- Physical Performance Testing, 97110-Therapeutic exercises, 97530- Therapeutic activity, [id number]- Neuromuscular re-education, 97535- Self Care, 02859- Manual therapy, [id number]- Gait training, [phone]- Canalith repositioning, [id number]- Aquatic Therapy, [phone]- Electrical stimulation (manual), [phone] (1-2 muscles), [id number] (3+ muscles)- Dry Needling, Patient/Family education, Balance training, Stair training, Joint mobilization, Spinal mobilization, Vestibular training, and DME instructions  PLAN FOR NEXT SESSION: 10th visit PN. Watch BP. Did she mail in paperwork to [facility]? Add to HEP for BLE and hip strength (sit to stands, hip 4-way, march overs, bridges). Scifit for endurance. Posterior chain strength and single leg stability. Blaze pods, rockerboard***   Waddell Southgate, PT Waddell Southgate, PT, DPT, CSRS    10/18/2023, 7:38 AM

## 2023-10-21 ENCOUNTER — Ambulatory Visit: Admitting: Physical Therapy

## 2023-10-21 VITALS — BP 144/97 | HR 72

## 2023-10-21 DIAGNOSIS — R2681 Unsteadiness on feet: Secondary | ICD-10-CM

## 2023-10-21 DIAGNOSIS — Z9181 History of falling: Secondary | ICD-10-CM

## 2023-10-21 DIAGNOSIS — M6281 Muscle weakness (generalized): Secondary | ICD-10-CM

## 2023-10-21 DIAGNOSIS — R2689 Other abnormalities of gait and mobility: Secondary | ICD-10-CM

## 2023-10-21 NOTE — Therapy (Signed)
 OUTPATIENT PHYSICAL THERAPY NEURO TREATMENT - 10th VISIT PROGRESS NOTE   Patient Name: Denise Cline MRN: [id number] DOB:[birth year], 83 y.o., female Today's Date: 10/21/2023   PCP: Campbell Reynolds, NP REFERRING PROVIDER: Leigh Venetia LITTIE, MD  Physical Therapy Progress Note   Dates of Reporting Period: 09/09/2023 - 10/21/2023  See Note below for Objective Data and Assessment of Progress/Goals.  Thank you for the referral of this patient. Waddell Southgate, PT, DPT, CSRS   END OF SESSION:  PT End of Session - 10/21/23 1102     Visit Number 10    Number of Visits 13   Plus eval   Date for PT Re-Evaluation 10/28/23    Authorization Type [employer] Medicare    Authorization Time Period 09/09/23 - 10/21/23 12 PT visits    PT Start Time 1100    PT Stop Time 1147    PT Time Calculation (min) 47 min    Equipment Utilized During Treatment Gait belt    Activity Tolerance Treatment limited secondary to medical complications (Comment);Patient tolerated treatment well   diastolic HTN initially, improves   Behavior During Therapy WFL for tasks assessed/performed                Past Medical History:  Diagnosis Date   Acute colitis    Anxiety    B12 deficiency    Bilateral swelling of feet    Constipation    COPD (chronic obstructive pulmonary disease) (HCC)    Depression    DVT (deep venous thrombosis) (HCC) 2014, 2017   Fibroid    Gallbladder problem    Heart valve problem    High cholesterol    History of shingles    Hypertension    IBS (irritable bowel syndrome)    Joint pain    PE (pulmonary thromboembolism) (HCC) 2017   SOB (shortness of breath)    Thyroid  disease    Nodules   Varicose veins    Vitamin D  deficiency    Past Surgical History:  Procedure Laterality Date   ABDOMINAL HYSTERECTOMY     CHOLECYSTECTOMY N/A 11/05/2017   Procedure: LAPAROSCOPIC CHOLECYSTECTOMY WITH INTRAOPERATIVE CHOLANGIOGRAM;  Surgeon: Curvin Deward MOULD, MD;  Location: [facility] OR;  Service: General;   Laterality: N/A;   COLONOSCOPY N/A 10/05/2014   Procedure: COLONOSCOPY;  Surgeon: Lamar Bunk, MD;  Location: [facility] ENDOSCOPY;  Service: Endoscopy;  Laterality: N/A;   ERCP N/A 11/04/2017   Procedure: ENDOSCOPIC RETROGRADE CHOLANGIOPANCREATOGRAPHY (ERCP) Balloon Extraction;  Surgeon: Saintclair Jasper, MD;  Location: [facility] ENDOSCOPY;  Service: Gastroenterology;  Laterality: N/A;   LAPAROSCOPIC CHOLECYSTECTOMY W/ CHOLANGIOGRAPHY  11/05/2017   Dr. Curvin   OOPHORECTOMY     One ovary removed   SPHINCTEROTOMY  11/04/2017   Procedure: SPHINCTEROTOMY;  Surgeon: Saintclair Jasper, MD;  Location: [facility] ENDOSCOPY;  Service: Gastroenterology;;   TOTAL THYROIDECTOMY     Patient Active Problem List   Diagnosis Date Noted   Choledocholithiasis with acute cholecystitis with obstruction 11/02/2017   Chronic kidney disease, stage 3 (HCC) 11/02/2017   DVT (deep venous thrombosis) (HCC) 11/12/2015   Pulmonary emboli (HCC) 11/11/2015   Acute pulmonary embolism (HCC) 11/11/2015   Coronary artery calcification seen on CAT scan 05/12/2015   Thoracic aortic aneurysm (HCC) 05/12/2015   Dyspnea 03/17/2015   Left leg swelling 03/16/2015   Acute colitis 10/04/2014   COPD (chronic obstructive pulmonary disease) (HCC)    PVD (peripheral vascular disease) (HCC) 04/01/2012   Leg pain 04/01/2012   Swelling of limb 04/01/2012   History of  shingles    Hypertension    Fibroid    Thyroid  disease    Anxiety     ONSET DATE: 08/23/2023 (referral)   REFERRING DIAG: R26.89 (ICD-10-CM) - Imbalance R29.6 (ICD-10-CM) - Falls  THERAPY DIAG:  Unsteadiness on feet  Other abnormalities of gait and mobility  Muscle weakness (generalized)  History of falling  Rationale for Evaluation and Treatment: Rehabilitation  SUBJECTIVE:                                                                                                                                                                                             SUBJECTIVE STATEMENT:  Pt  reports that she had a rough weekend, she has been in [place] since last Tuesday taking care of her sister in law, she just got back Sunday night. Her sister in law did fall Thursday night and she helped her back up by herself. She is worried that her BP is going to be high today due to ongoing stress, brought in her personal cuff from home.  Pt brings in her paperwork for her new PCP visit tomorrow with [facility] in [place] - encouraged her to fill this out prior to her appointment tomorrow. Pt continues to admit she does not take her BP medication.   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 No  PRECAUTIONS: Fall  RED FLAGS: None   WEIGHT BEARING RESTRICTIONS: No  FALLS: Has patient fallen in last 6 months? Yes. Number of falls 1  LIVING ENVIRONMENT: Lives with: lives alone Lives in: House/apartment Stairs: Yes: External: 3 steps; on right going up, on left going up, and can reach both Has following equipment at home: Single point cane, shower chair, Grab bars, and has to put shower chair and grab bars together, no installed currently   PLOF: Independent  PATIENT GOALS: I need my balance back and to be able to walk better    OBJECTIVE:  Note: Objective measures were completed at Evaluation unless otherwise noted.  DIAGNOSTIC FINDINGS: CT of head from 08/2022  IMPRESSION: 1. No CT evidence for acute intracranial abnormality. 2. Atrophy and chronic small vessel ischemic changes of the white matter.  COGNITION: Overall cognitive status: Difficulty to assess due to: no family present   SENSATION: Pt reports impaired sensation in bilateral feet and hands    EDEMA: Chronic swelling in LLE - history of DVT in 2019     POSTURE: rounded shoulders,  forward head, and  increased thoracic kyphosis  LOWER EXTREMITY ROM:     Active  Right Eval Left Eval  Hip flexion    Hip extension    Hip abduction    Hip adduction    Hip internal rotation    Hip external rotation    Knee flexion    Knee extension    Ankle dorsiflexion    Ankle plantarflexion    Ankle inversion    Ankle eversion     (Blank rows = not tested)  LOWER EXTREMITY MMT:  Tested in seated position   MMT Right Eval Left Eval  Hip flexion 3+ 3+  Hip extension    Hip abduction 4 4  Hip adduction 3+ 3+  Hip internal rotation    Hip external rotation    Knee flexion 4- 4-  Knee extension 4- 4-  Ankle dorsiflexion 4 4  Ankle plantarflexion    Ankle inversion    Ankle eversion    (Blank rows = not tested)  BED MOBILITY:  Not tested  TRANSFERS: Sit to stand: SBA  Assistive device utilized: None     Stand to sit: SBA  Assistive device utilized: None      RAMP:  Not tested  CURB:  Not tested  STAIRS: Not tested GAIT: Gait pattern: step through pattern, decreased arm swing- Right, decreased arm swing- Left, decreased stride length, decreased hip/knee flexion- Right, decreased hip/knee flexion- Left, lateral hip instability, decreased trunk rotation, wide BOS, and poor foot clearance- Left Distance walked: Various clinic distances  Assistive device utilized: None Level of assistance: Modified independence Comments: Pt ambulates w/wide BOS and waddle-like pattern. Decreased cadence. No LOB noted    FUNCTIONAL TESTS:   [facility] PT Assessment - 10/21/23 1132       Ambulation/Gait   Gait velocity 32.8 ft over 9.3 sec = 3.5 ft/sec      Standardized Balance Assessment   Standardized Balance Assessment Five Times Sit to Stand    Five times sit to stand comments  22 sec   no UE     Functional Gait  Assessment   Gait assessed  Yes    Gait Level Surface Walks 20 ft in less than 5.5 sec, no assistive devices, good speed, no evidence for imbalance, normal gait pattern,  deviates no more than 6 in outside of the 12 in walkway width.    Change in Gait Speed Able to smoothly change walking speed without loss of balance or gait deviation. Deviate no more than 6 in outside of the 12 in walkway width.    Gait with Horizontal Head Turns Performs head turns smoothly with slight change in gait velocity (eg, minor disruption to smooth gait path), deviates 6-10 in outside 12 in walkway width, or uses an assistive device.    Gait with Vertical Head Turns Performs task with slight change in gait velocity (eg, minor disruption to smooth gait path), deviates 6 - 10 in outside 12 in walkway width or uses assistive device    Gait and Pivot Turn Pivot turns safely within 3 sec and stops quickly with no loss of balance.    Step Over Obstacle Is able to step over 2 stacked shoe boxes taped together (9 in total height) without changing gait speed. No evidence of imbalance.    Gait with Narrow Base of Support Ambulates less than 4 steps heel to toe or cannot perform without assistance.    Gait with Eyes Closed Walks 20 ft, slow speed,  abnormal gait pattern, evidence for imbalance, deviates 10-15 in outside 12 in walkway width. Requires more than 9 sec to ambulate 20 ft.    Ambulating Backwards Walks 20 ft, uses assistive device, slower speed, mild gait deviations, deviates 6-10 in outside 12 in walkway width.    Steps Two feet to a stair, must use rail.    Total Score 20    FGA comment: 20/50, medium fall risk               VITALS  Vitals:   10/21/23 1251 10/21/23 1252  BP: (!) 155/104 (!) 144/97  Pulse: 72 72                                                                                                                                    TREATMENT:   Self-care/home management  Assessed vitals (see above) in LUE at rest and diastolic BP elevated beyond safe limits for session initially, does improve with seated rest break though it still remains elevated. See below  for all readings obtained: BP readings obtained 2 min apart in LUE while pt seated: 155/104, HR 72 162/106, HR 74 150/95, HR 68 (with patient's personal device) 144/97, HR 72  For LTG assessment for updated insurance authorization:  [facility] PT Assessment - 10/21/23 1132       Ambulation/Gait   Gait velocity 32.8 ft over 9.3 sec = 3.5 ft/sec      Standardized Balance Assessment   Standardized Balance Assessment Five Times Sit to Stand    Five times sit to stand comments  22 sec   no UE     Functional Gait  Assessment   Gait assessed  Yes    Gait Level Surface Walks 20 ft in less than 5.5 sec, no assistive devices, good speed, no evidence for imbalance, normal gait pattern, deviates no more than 6 in outside of the 12 in walkway width.    Change in Gait Speed Able to smoothly change walking speed without loss of balance or gait deviation. Deviate no more than 6 in outside of the 12 in walkway width.    Gait with Horizontal Head Turns Performs head turns smoothly with slight change in gait velocity (eg, minor disruption to smooth gait path), deviates 6-10 in outside 12 in walkway width, or uses an assistive device.    Gait with Vertical Head Turns Performs task with slight change in gait velocity (eg, minor disruption to smooth gait path), deviates 6 - 10 in outside 12 in walkway width or uses assistive device    Gait and Pivot Turn Pivot turns safely within 3 sec and stops quickly with no loss of balance.    Step Over Obstacle Is able to step over 2 stacked shoe boxes taped together (9 in total height) without changing gait speed. No evidence of imbalance.    Gait with Narrow Base of Support Ambulates less than 4 steps  heel to toe or cannot perform without assistance.    Gait with Eyes Closed Walks 20 ft, slow speed, abnormal gait pattern, evidence for imbalance, deviates 10-15 in outside 12 in walkway width. Requires more than 9 sec to ambulate 20 ft.    Ambulating Backwards Walks 20 ft, uses  assistive device, slower speed, mild gait deviations, deviates 6-10 in outside 12 in walkway width.    Steps Two feet to a stair, must use rail.    Total Score 20    FGA comment: 20/50, medium fall risk          PATIENT EDUCATION: Education details: continue to monitor BP and discuss her readings/medication with new PCP tomorrow, results of OM and functional implications Person educated: Patient Education method: Explanation, Demonstration, and Handouts Education comprehension: verbalized understanding, returned demonstration, and needs further education  HOME EXERCISE PROGRAM: Access Code: [email or web address] URL: [email or web address] Date: 09/20/2023 Prepared by: Marlon Plaster  Exercises - Seated march over  - 1 x daily - 7 x weekly - 3 sets - 10-15 reps - Side Stepping with Resistance at Ankles and Counter Support  - 1 x daily - 7 x weekly - 2-5 reps  GOALS: Goals reviewed with patient? Yes  SHORT TERM GOALS: Target date: 10/07/2023   Pt will be independent with initial HEP for improved strength, balance, transfers and gait.  Baseline: not established on eval  Goal status: MET  2.  Pt will improve 5 x STS to less than or equal to 25 seconds to demonstrate improved functional strength and transfer efficiency.   Baseline: 30.16s w/BUE support; 19.03s w/intermittent UE support (8/4) Goal status: MET  3.  Pt will improve gait velocity to at least 1.0 m/s for improved gait efficiency and independence    Baseline: 0.91 m/s no AD; 0.96 m/s no AD (8/4)  Goal status: NOT MET  4.  Pt will trial various AD to determine safest option for pt for reduced fall risk  Baseline: pt has SPC that she has not used  Goal status: DC  5.  FGA to be assessed and LTG updated  Baseline: 15/30 (8/8) Goal status: MET  LONG TERM GOALS: Target date: 10/28/2023 (updated to match last scheduled appt date within POC)   Pt will be independent with final HEP for improved strength, balance,  transfers and gait.  Baseline:  Goal status: INITIAL  2.  Pt will improve 5 x STS to less than or equal to 17 seconds to demonstrate improved functional strength and transfer efficiency.   Baseline: 30.16s w/BUE support; 19.03s w/intermittent UE support (8/4), 22 sec no UE (8/18) Goal status: REVISED  3.  Pt will improve gait velocity to at least 1.2 m/s for improved gait efficiency and independence   Baseline: 0.91 m/s no AD (7/18); 0.96 m/s no AD (8/4), 1.07 m/s no AD (8/18) Goal status: IN PROGRESS  4.  Pt will improve FGA to 19/30 for decreased fall risk   Baseline: 15/30 (8/8), 20/30 (8/18) Goal status: MET  5.  Pt will perform floor transfer mod I for safe fall recovery and proper body mechanics  Baseline:  Goal status: INITIAL   ASSESSMENT:  CLINICAL IMPRESSION: Emphasis of skilled PT session on continuing to assess BP and initiating assessment of LTG for continued insurance authorization for PT services. Pt's BP remains elevated and outside safe limits for PT initially, does improve with seated rest break. She plans to see a new PCP tomorrow, encouraged her to discuss her hypertension with her  PCP and provided a BP log and chart for normal and hypertensive BP readings. Pt with ongoing life stressors, need to follow up on Behavioral Health paperwork. She showed decreased fall risk with improved gait speed and FGA scores this date as compared to previous sessions. She did take increased time to complete the 5xSTS, however she did not need to use UE support during this testing and it is to be expected that her progress so far would fluctuate as she has not been able to consistently attend and fully participate in PT sessions due to ongoing hypertension and life stressors. She does continue to benefit from skilled PT services to address her impaired balance and increased fall risk but she also needs to better manage her BP and pursue a Behavioral Health appointment to address her  mental health and the significant amount of stress that she has in her life. Continue POC.   OBJECTIVE IMPAIRMENTS: Abnormal gait, decreased activity tolerance, decreased balance, decreased cognition, decreased endurance, decreased knowledge of condition, decreased knowledge of use of DME, decreased mobility, difficulty walking, decreased strength, decreased safety awareness, increased edema, impaired sensation, improper body mechanics, and pain  ACTIVITY LIMITATIONS: carrying, lifting, bending, standing, squatting, stairs, transfers, hygiene/grooming, locomotion level, and caring for others  PARTICIPATION LIMITATIONS: meal prep, cleaning, laundry, medication management, interpersonal relationship, driving, shopping, community activity, and yard work  PERSONAL FACTORS: Age, Fitness, Past/current experiences, Transportation, and 1-2 comorbidities: mixed connective tissue disease and polyneuropathy are also affecting patient's functional outcome.   REHAB POTENTIAL: Good  CLINICAL DECISION MAKING: Evolving/moderate complexity  EVALUATION COMPLEXITY: Moderate  PLAN:  PT FREQUENCY: 2x/week  PT DURATION: 6 weeks  PLANNED INTERVENTIONS: [id number]- PT Re-evaluation, 97750- Physical Performance Testing, 97110-Therapeutic exercises, 97530- Therapeutic activity, [id number]- Neuromuscular re-education, 97535- Self Care, [id number]- Manual therapy, [id number]- Gait training, [phone]- Canalith repositioning, [id number]- Aquatic Therapy, [phone]- Electrical stimulation (manual), [phone] (1-2 muscles), [id number] (3+ muscles)- Dry Needling, Patient/Family education, Balance training, Stair training, Joint mobilization, Spinal mobilization, Vestibular training, and DME instructions  PLAN FOR NEXT SESSION: did we get updated insurance auth? Watch BP. Did she mail in paperwork to [facility]? Add to HEP for BLE and hip strength (sit to stands, hip 4-way, march overs, bridges). Scifit for endurance. Posterior chain strength and single leg stability.  Blaze pods, rockerboard   Waddell Southgate, PT Waddell Southgate, PT, DPT, CSRS    10/21/2023, 12:51 PM

## 2023-10-25 ENCOUNTER — Ambulatory Visit: Admitting: Physical Therapy

## 2023-10-25 VITALS — BP 181/112 | HR 59

## 2023-10-25 DIAGNOSIS — R2681 Unsteadiness on feet: Secondary | ICD-10-CM | POA: Diagnosis not present

## 2023-10-25 DIAGNOSIS — R2689 Other abnormalities of gait and mobility: Secondary | ICD-10-CM

## 2023-10-25 NOTE — Therapy (Addendum)
 OUTPATIENT PHYSICAL THERAPY NEURO TREATMENT    Patient Name: Denise Cline MRN: [id number] DOB:1940-09-22, 83 y.o., female Today's Date: 10/25/2023   PCP: Campbell Reynolds, NP REFERRING PROVIDER: Leigh Venetia LITTIE, MD    END OF SESSION:  PT End of Session - 10/25/23 1105     Visit Number 11    Number of Visits 13   Plus eval   Date for PT Re-Evaluation 10/28/23    Authorization Type [employer] Medicare    Authorization Time Period 6 visits approved from 10/25/23-11/22/23    PT Start Time 1102    PT Stop Time 1145    PT Time Calculation (min) 43 min    Equipment Utilized During Treatment --    Activity Tolerance Treatment limited secondary to medical complications (Comment)   HTN   Behavior During Therapy [facility] for tasks assessed/performed;Anxious                 Past Medical History:  Diagnosis Date   Acute colitis    Anxiety    B12 deficiency    Bilateral swelling of feet    Constipation    COPD (chronic obstructive pulmonary disease) (HCC)    Depression    DVT (deep venous thrombosis) (HCC) 2014, 2017   Fibroid    Gallbladder problem    Heart valve problem    High cholesterol    History of shingles    Hypertension    IBS (irritable bowel syndrome)    Joint pain    PE (pulmonary thromboembolism) (HCC) 2017   SOB (shortness of breath)    Thyroid  disease    Nodules   Varicose veins    Vitamin D  deficiency    Past Surgical History:  Procedure Laterality Date   ABDOMINAL HYSTERECTOMY     CHOLECYSTECTOMY N/A 11/05/2017   Procedure: LAPAROSCOPIC CHOLECYSTECTOMY WITH INTRAOPERATIVE CHOLANGIOGRAM;  Surgeon: Curvin Deward MOULD, MD;  Location: [facility] OR;  Service: General;  Laterality: N/A;   COLONOSCOPY N/A 10/05/2014   Procedure: COLONOSCOPY;  Surgeon: Lamar Bunk, MD;  Location: [facility] ENDOSCOPY;  Service: Endoscopy;  Laterality: N/A;   ERCP N/A 11/04/2017   Procedure: ENDOSCOPIC RETROGRADE CHOLANGIOPANCREATOGRAPHY (ERCP) Balloon Extraction;  Surgeon: Saintclair Jasper, MD;  Location:  [facility] ENDOSCOPY;  Service: Gastroenterology;  Laterality: N/A;   LAPAROSCOPIC CHOLECYSTECTOMY W/ CHOLANGIOGRAPHY  11/05/2017   Dr. Curvin   OOPHORECTOMY     One ovary removed   SPHINCTEROTOMY  11/04/2017   Procedure: SPHINCTEROTOMY;  Surgeon: Saintclair Jasper, MD;  Location: [facility] ENDOSCOPY;  Service: Gastroenterology;;   TOTAL THYROIDECTOMY     Patient Active Problem List   Diagnosis Date Noted   Choledocholithiasis with acute cholecystitis with obstruction 11/02/2017   Chronic kidney disease, stage 3 (HCC) 11/02/2017   DVT (deep venous thrombosis) (HCC) 11/12/2015   Pulmonary emboli (HCC) 11/11/2015   Acute pulmonary embolism (HCC) 11/11/2015   Coronary artery calcification seen on CAT scan 05/12/2015   Thoracic aortic aneurysm (HCC) 05/12/2015   Dyspnea 03/17/2015   Left leg swelling 03/16/2015   Acute colitis 10/04/2014   COPD (chronic obstructive pulmonary disease) (HCC)    PVD (peripheral vascular disease) (HCC) 04/01/2012   Leg pain 04/01/2012   Swelling of limb 04/01/2012   History of shingles    Hypertension    Fibroid    Thyroid  disease    Anxiety     ONSET DATE: 08/23/2023 (referral)   REFERRING DIAG: R26.89 (ICD-10-CM) - Imbalance R29.6 (ICD-10-CM) - Falls  THERAPY DIAG:  Other abnormalities of gait and mobility  Rationale for Evaluation and Treatment: Rehabilitation  SUBJECTIVE:                                                                                                                                                                                             SUBJECTIVE STATEMENT:  Pt reports that she had a rough week. Saw her PCP on Tuesday and brought in the summary paperwork. Was told to restart all her prescriptions and emphasized need to see a counselor. Has not started her medications yet due to fear of side effects. No falls.    Pt accompanied by: self (pt is driving)   PERTINENT HISTORY: HTN, HLD, CHF, pre-DM, mixed connective tissue disease, DVT and PE  (on Xarelto ), pancreatitis, COPD, former smoker, gallbladder removal, CKD, depression  PAIN:  Are you having pain? No  PRECAUTIONS: Fall  RED FLAGS: None   WEIGHT BEARING RESTRICTIONS: No  FALLS: Has patient fallen in last 6 months? Yes. Number of falls 1  LIVING ENVIRONMENT: Lives with: lives alone Lives in: House/apartment Stairs: Yes: External: 3 steps; on right going up, on left going up, and can reach both Has following equipment at home: Single point cane, shower chair, Grab bars, and has to put shower chair and grab bars together, no installed currently   PLOF: Independent  PATIENT GOALS: I need my balance back and to be able to walk better    OBJECTIVE:  Note: Objective measures were completed at Evaluation unless otherwise noted.  DIAGNOSTIC FINDINGS: CT of head from 08/2022  IMPRESSION: 1. No CT evidence for acute intracranial abnormality. 2. Atrophy and chronic small vessel ischemic changes of the white matter.  COGNITION: Overall cognitive status: Difficulty to assess due to: no family present   SENSATION: Pt reports impaired sensation in bilateral feet and hands    EDEMA: Chronic swelling in LLE - history of DVT in 2019     POSTURE: rounded shoulders, forward head, and increased thoracic kyphosis  LOWER EXTREMITY ROM:     Active  Right Eval Left Eval  Hip flexion    Hip extension    Hip abduction    Hip adduction    Hip internal rotation    Hip external rotation    Knee flexion    Knee extension    Ankle dorsiflexion    Ankle plantarflexion    Ankle inversion    Ankle eversion     (Blank rows = not tested)  LOWER EXTREMITY MMT:  Tested in seated position   MMT Right Eval Left Eval  Hip flexion 3+ 3+  Hip extension    Hip abduction 4 4  Hip adduction 3+ 3+  Hip internal rotation    Hip external rotation    Knee flexion 4- 4-  Knee extension 4- 4-  Ankle dorsiflexion 4 4  Ankle plantarflexion    Ankle inversion    Ankle  eversion    (Blank rows = not tested)  BED MOBILITY:  Not tested  TRANSFERS: Sit to stand: SBA  Assistive device utilized: None     Stand to sit: SBA  Assistive device utilized: None      RAMP:  Not tested  CURB:  Not tested  STAIRS: Not tested GAIT: Gait pattern: step through pattern, decreased arm swing- Right, decreased arm swing- Left, decreased stride length, decreased hip/knee flexion- Right, decreased hip/knee flexion- Left, lateral hip instability, decreased trunk rotation, wide BOS, and poor foot clearance- Left Distance walked: Various clinic distances  Assistive device utilized: None Level of assistance: Modified independence Comments: Pt ambulates w/wide BOS and waddle-like pattern. Decreased cadence. No LOB noted    FUNCTIONAL TESTS:          VITALS  Vitals:   10/25/23 1110  BP: (!) 181/112  Pulse: (!) 59                                                                                                                                     TREATMENT:   Self-care/home management  Assessed vitals (see above) in LUE at rest and BP too elevated to participate in PT. Pt reports she understands that anxiety can increase BP and that it plays a large role in her HTN. Encouraged pt to start taking her BP meds today, pt verbalized understanding.  Provided therapeutic listening as pt recalls her rough week and process the recommendation from her PCP to move to an AFL/ILF as she lives alone and has no family nearby. Continue to recommend pt prioritize counseling and herself, as pt has been spread thin traveling to [place] to take care of her sister-in-law. At this time, advised pt to DC from PT to focus on her mental health and potentially moving, as her BP and trips to [place] have been a limiting factor in PT. Pt in agreement with this and states she plans on returning to church next week and building her network back. Pt also waiting on [place] to call to set up  counseling. Pt educated on how to return to PT in future once pt is more settled, pt verbalized understanding.   PATIENT EDUCATION: Education details: See above, plan to DC next session  Person educated: Patient Education method: Explanation and Demonstration Education comprehension: verbalized understanding, returned demonstration, and needs further education  HOME EXERCISE PROGRAM: Access Code: [email or web address] URL: [email or web address] Date: 09/20/2023 Prepared by: Marlon Atlas Kuc  Exercises - Seated march over  - 1 x daily - 7 x weekly - 3 sets - 10-15 reps - Side Stepping with Resistance at Ankles and Counter Support  - 1 x daily -  7 x weekly - 2-5 reps  GOALS: Goals reviewed with patient? Yes  SHORT TERM GOALS: Target date: 10/07/2023   Pt will be independent with initial HEP for improved strength, balance, transfers and gait.  Baseline: not established on eval  Goal status: MET  2.  Pt will improve 5 x STS to less than or equal to 25 seconds to demonstrate improved functional strength and transfer efficiency.   Baseline: 30.16s w/BUE support; 19.03s w/intermittent UE support (8/4) Goal status: MET  3.  Pt will improve gait velocity to at least 1.0 m/s for improved gait efficiency and independence    Baseline: 0.91 m/s no AD; 0.96 m/s no AD (8/4)  Goal status: NOT MET  4.  Pt will trial various AD to determine safest option for pt for reduced fall risk  Baseline: pt has SPC that she has not used  Goal status: DC  5.  FGA to be assessed and LTG updated  Baseline: 15/30 (8/8) Goal status: MET  LONG TERM GOALS: Target date: 10/28/2023 (updated to match last scheduled appt date within POC)   Pt will be independent with final HEP for improved strength, balance, transfers and gait.  Baseline:  Goal status: INITIAL  2.  Pt will improve 5 x STS to less than or equal to 17 seconds to demonstrate improved functional strength and transfer efficiency.   Baseline:  30.16s w/BUE support; 19.03s w/intermittent UE support (8/4), 22 sec no UE (8/18) Goal status: REVISED  3.  Pt will improve gait velocity to at least 1.2 m/s for improved gait efficiency and independence   Baseline: 0.91 m/s no AD (7/18); 0.96 m/s no AD (8/4), 1.07 m/s no AD (8/18) Goal status: IN PROGRESS  4.  Pt will improve FGA to 19/30 for decreased fall risk   Baseline: 15/30 (8/8), 20/30 (8/18) Goal status: MET  5.  Pt will perform floor transfer mod I for safe fall recovery and proper body mechanics  Baseline:  Goal status: INITIAL   ASSESSMENT:  CLINICAL IMPRESSION: Session limited due to continued uncontrolled HTN. Pt reports she saw PCP on Tuesday and was given new prescriptions for BP and strongly encouraged to set up counseling as soon as possible. Pt has been limited in PT due to uncontrolled HTN and life stressors, often requiring more therapeutic listening than activity during sessions. Pt in agreement to DC from PT next session in order to prioritize counseling and herself, as she is considering moving to an ILF/ALF and selling her house. Continue POC.    OBJECTIVE IMPAIRMENTS: Abnormal gait, decreased activity tolerance, decreased balance, decreased cognition, decreased endurance, decreased knowledge of condition, decreased knowledge of use of DME, decreased mobility, difficulty walking, decreased strength, decreased safety awareness, increased edema, impaired sensation, improper body mechanics, and pain  ACTIVITY LIMITATIONS: carrying, lifting, bending, standing, squatting, stairs, transfers, hygiene/grooming, locomotion level, and caring for others  PARTICIPATION LIMITATIONS: meal prep, cleaning, laundry, medication management, interpersonal relationship, driving, shopping, community activity, and yard work  PERSONAL FACTORS: Age, Fitness, Past/current experiences, Transportation, and 1-2 comorbidities: mixed connective tissue disease and polyneuropathy are also  affecting patient's functional outcome.   REHAB POTENTIAL: Good  CLINICAL DECISION MAKING: Evolving/moderate complexity  EVALUATION COMPLEXITY: Moderate  PLAN:  PT FREQUENCY: 2x/week  PT DURATION: 6 weeks  PLANNED INTERVENTIONS: [id number]- PT Re-evaluation, 97750- Physical Performance Testing, 97110-Therapeutic exercises, 97530- Therapeutic activity, [id number]- Neuromuscular re-education, 97535- Self Care, [id number]- Manual therapy, [id number]- Gait training, [phone]- Canalith repositioning, [id number]- Aquatic Therapy, [id number]- Electrical stimulation (manual), [phone] (1-2  muscles), [phone] (3+ muscles)- Dry Needling, Patient/Family education, Balance training, Stair training, Joint mobilization, Spinal mobilization, Vestibular training, and DME instructions  PLAN FOR NEXT SESSION: goals and DC. Give pt Aymar Whitfill's card    Marlon BRAVO Tyreka Henneke, PT, DPT  10/25/2023, 1:23 PM

## 2023-10-28 ENCOUNTER — Ambulatory Visit: Admitting: Physical Therapy

## 2023-11-01 ENCOUNTER — Telehealth: Payer: Self-pay | Admitting: Physical Therapy

## 2023-11-01 ENCOUNTER — Encounter (HOSPITAL_COMMUNITY): Payer: Self-pay

## 2023-11-01 ENCOUNTER — Emergency Department (HOSPITAL_COMMUNITY)

## 2023-11-01 ENCOUNTER — Ambulatory Visit: Admitting: Physical Therapy

## 2023-11-01 ENCOUNTER — Emergency Department (HOSPITAL_COMMUNITY)
Admission: EM | Admit: 2023-11-01 | Discharge: 2023-11-01 | Disposition: A | Discharge status: Home / Self Care | Attending: Emergency Medicine | Admitting: Emergency Medicine

## 2023-11-01 DIAGNOSIS — R197 Diarrhea, unspecified: Secondary | ICD-10-CM | POA: Diagnosis present

## 2023-11-01 DIAGNOSIS — Z7901 Long term (current) use of anticoagulants: Secondary | ICD-10-CM | POA: Diagnosis not present

## 2023-11-01 DIAGNOSIS — Z7982 Long term (current) use of aspirin: Secondary | ICD-10-CM | POA: Insufficient documentation

## 2023-11-01 LAB — URINALYSIS, ROUTINE W REFLEX MICROSCOPIC
Bilirubin Urine: NEGATIVE
Glucose, UA: NEGATIVE mg/dL
Hgb urine dipstick: NEGATIVE
Ketones, ur: NEGATIVE mg/dL
Nitrite: NEGATIVE
Protein, ur: NEGATIVE mg/dL
Specific Gravity, Urine: 1.015 (ref 1.005–1.030)
pH: 5 (ref 5.0–8.0)

## 2023-11-01 LAB — COMPREHENSIVE METABOLIC PANEL WITH GFR
ALT: 14 U/L (ref 0–44)
AST: 21 U/L (ref 15–41)
Albumin: 3.5 g/dL (ref 3.5–5.0)
Alkaline Phosphatase: 47 U/L (ref 38–126)
Anion gap: 12 (ref 5–15)
BUN: 21 mg/dL (ref 8–23)
CO2: 25 mmol/L (ref 22–32)
Calcium: 9.1 mg/dL (ref 8.9–10.3)
Chloride: 100 mmol/L (ref 98–111)
Creatinine, Ser: 0.94 mg/dL (ref 0.44–1.00)
GFR, Estimated: 59 mL/min — ABNORMAL LOW (ref >60)
Glucose, Bld: 108 mg/dL — ABNORMAL HIGH (ref 70–99)
Potassium: 3.5 mmol/L (ref 3.5–5.1)
Sodium: 137 mmol/L (ref 135–145)
Total Bilirubin: 0.5 mg/dL (ref 0.0–1.2)
Total Protein: 7.1 g/dL (ref 6.5–8.1)

## 2023-11-01 LAB — MAGNESIUM: Magnesium: 1.7 mg/dL (ref 1.7–2.4)

## 2023-11-01 LAB — CBC
HCT: 40.8 % (ref 36.0–46.0)
Hemoglobin: 12.9 g/dL (ref 12.0–15.0)
MCH: 31.7 pg (ref 26.0–34.0)
MCHC: 31.6 g/dL (ref 30.0–36.0)
MCV: 100.2 fL — ABNORMAL HIGH (ref 80.0–100.0)
Platelets: 274 K/uL (ref 150–400)
RBC: 4.07 MIL/uL (ref 3.87–5.11)
RDW: 15.1 % (ref 11.5–15.5)
WBC: 6.2 K/uL (ref 4.0–10.5)
nRBC: 0 % (ref 0.0–0.2)

## 2023-11-01 LAB — LIPASE, BLOOD: Lipase: 29 U/L (ref 11–51)

## 2023-11-01 MED ORDER — ONDANSETRON 4 MG PO TBDP: ORAL_TABLET | ORAL | 0 refills | Status: AC

## 2023-11-01 MED ORDER — IOHEXOL 350 MG/ML SOLN
100.0000 mL | Freq: Once | INTRAVENOUS | Status: AC | PRN
  Administered 2023-11-01: 100 mL via INTRAVENOUS

## 2023-11-01 MED ORDER — SODIUM CHLORIDE 0.9 % IV BOLUS
1000.0000 mL | Freq: Once | INTRAVENOUS | Status: AC
  Administered 2023-11-01: 1000 mL via INTRAVENOUS

## 2023-11-01 MED ORDER — POTASSIUM CHLORIDE 20 MEQ PO PACK
60.0000 meq | PACK | Freq: Two times a day (BID) | ORAL | Status: DC
  Administered 2023-11-01: 60 meq via ORAL
  Filled 2023-11-01: qty 3

## 2023-11-01 NOTE — Discharge Instructions (Signed)
 Take the nausea medicine for nausea.  You can take Imodium for diarrhea.  Please follow-up with your family doctor in the office.

## 2023-11-01 NOTE — ED Triage Notes (Signed)
 Pt arrives via POV. Pt reports she recently started taking duloxetine this past Saturday and states since then she has been experiencing fatigue, nausea, vomiting and diarrhea. She also reports worsening neuropathy and pain from her arthritis. Pt is AxOx4.

## 2023-11-01 NOTE — ED Provider Notes (Signed)
 [place] EMERGENCY DEPARTMENT AT [facility] Provider Note   CSN: [id number] Arrival date & time: 11/01/23  1411     Patient presents with: Abdominal Pain, Nausea, and Fatigue   Denise Cline is a 83 y.o. female.   83 yo F with multiple complaints.  The patient said that she took a new antidepressant medication and developed difficulty with her vision and numbness to her hands and feet.  She said that she was unable to stand or walk well for a couple days.  She has gotten better but still feels like her vision is not right.  She cannot quantify this.  Tells me that there is something behind it.  She also feels like she cannot feel her feet from the knees down.  She tells me that this is new.  She is pretty sure the medication caused this problem.  She denies any chest pain.  Has been having diarrhea for some time.  When asked her how long she cannot tell me.  Says it has been a while.  More than a week.  She does tell me multiple times that she has had 2 sensitive cancer and has been traveling to [place] to help out a family member.  She is taking care of a dog that was one of her sons.   Abdominal Pain      Prior to Admission medications   Medication Sig Start Date End Date Taking? Authorizing Provider  ondansetron  (ZOFRAN -ODT) 4 MG disintegrating tablet 4mg  ODT q4 hours prn nausea/vomit 11/01/23  Yes Deral Schellenberg, DO  acetaminophen  (TYLENOL ) 650 MG CR tablet Take 1,300 mg by mouth 2 (two) times a day. Patient not taking: Reported on 08/23/2023    [provider]  aspirin  EC 81 MG tablet Take 81 mg by mouth daily. Swallow whole. Patient not taking: Reported on 08/23/2023    [provider]  Cholecalciferol (VITAMIN D3) 125 MCG (5000 UT) CAPS Take by mouth. Patient not taking: Reported on 08/23/2023    [provider]  Colesevelam HCl 3.75 g PACK Take by mouth. Patient not taking: Reported on 08/23/2023    [provider]   hydrochlorothiazide  (HYDRODIURIL ) 25 MG tablet Take 25 mg by mouth daily.  Patient not taking: Reported on 08/23/2023 02/05/18   [provider]  lidocaine  (LIDODERM ) 5 % Place 1 patch onto the skin daily. Remove & Discard patch within 12 hours or as directed by MD Patient not taking: Reported on 08/23/2023 08/25/21   Jarold Olam HERO, PA-C  losartan  (COZAAR ) 100 MG tablet Take 100 mg by mouth daily. Patient not taking: Reported on 08/23/2023 02/05/18   [provider]  Magnesium  250 MG TABS Take by mouth. Patient not taking: Reported on 08/23/2023    [provider]  Multiple Vitamins-Minerals (CENTRUM SILVER ADULT 50+) TABS Take 1 tablet by mouth daily. Patient not taking: Reported on 08/23/2023    [provider]  pantoprazole  (PROTONIX ) 40 MG tablet TAKE ONE TABLET BY MOUTH EVERY EVENING Patient not taking: Reported on 08/23/2023 02/21/22   [name], MD  polyethylene glycol (MIRALAX  / GLYCOLAX ) packet Take 17 g by mouth daily. Patient not taking: Reported on 08/23/2023 11/07/17   Vicci Burnard SAUNDERS, PA-C  rivaroxaban  (XARELTO ) 20 MG TABS tablet Take 20 mg by mouth daily with supper.    [provider]  rosuvastatin  (CRESTOR ) 20 MG tablet TAKE ONE TABLET BY MOUTH EVERY EVENING Patient not taking: Reported on 08/23/2023 02/21/22   Claudene Victory  W, MD  saccharomyces boulardii (FLORASTOR) 250 MG capsule Take 250 mg by mouth 2 (two) times daily. Patient not taking: Reported on 08/23/2023    [provider]  sertraline (ZOLOFT) 50 MG tablet  02/12/19   [provider]  traMADol  (ULTRAM ) 50 MG tablet Take 1 tablet (50 mg total) by mouth every 6 (six) hours as needed for moderate pain. Patient not taking: Reported on 08/23/2023 08/23/15   Mesner, Jason, MD  traZODone (DESYREL) 50 MG tablet Take 50 mg by mouth at bedtime. Patient not taking: Reported on 08/23/2023    [provider]  triamcinolone  (KENALOG ) 0.025 % cream Apply 1  application topically daily as needed for itching. Patient not taking: Reported on 08/23/2023 03/29/17   [provider]  Vitamin D , Ergocalciferol , (DRISDOL ) 1.25 MG (50000 UNIT) CAPS capsule TAKE ONE CAPSULE BY MOUTH ONCE WEEKLY ON MONDAY Patient not taking: Reported on 08/23/2023 02/23/21   Verdon Parry D, MD    Allergies: Penicillins, Sulfa  antibiotics, Ace inhibitors, Atorvastatin, Citrus, Oxycodone, Sulfamethoxazole -trimethoprim , and Vicodin [place].Arrant ]    Review of Systems  Gastrointestinal:  Positive for abdominal pain.    Updated Vital Signs BP (!) 145/98   Pulse 66   Temp 97.8 F (36.6 C)   Resp 18   SpO2 100%   Physical Exam Vitals and nursing note reviewed.  Constitutional:      General: She is not in acute distress.    Appearance: She is well-developed. She is not diaphoretic.  HENT:     Head: Normocephalic and atraumatic.  Eyes:     Pupils: Pupils are equal, round, and reactive to light.  Cardiovascular:     Rate and Rhythm: Normal rate and regular rhythm.     Heart sounds: No murmur heard.    No friction rub. No gallop.  Pulmonary:     Effort: Pulmonary effort is normal.     Breath sounds: No wheezing or rales.  Abdominal:     General: There is no distension.     Palpations: Abdomen is soft.     Tenderness: There is no abdominal tenderness.  Musculoskeletal:        General: No tenderness.     Cervical back: Normal range of motion and neck supple.  Skin:    General: Skin is warm and dry.  Neurological:     Mental Status: She is alert and oriented to person, place, and time.  Psychiatric:        Behavior: Behavior normal.     (all labs ordered are listed, but only abnormal results are displayed) Labs Reviewed  COMPREHENSIVE METABOLIC PANEL WITH GFR - Abnormal; Notable for the following components:      Result Value   Glucose, Bld 108 (*)    GFR, Estimated 59 (*)    All other components within normal limits  CBC -  Abnormal; Notable for the following components:   MCV 100.2 (*)    All other components within normal limits  URINALYSIS, ROUTINE W REFLEX MICROSCOPIC - Abnormal; Notable for the following components:   APPearance HAZY (*)    Leukocytes,Ua MODERATE (*)    Bacteria, UA RARE (*)    All other components within normal limits  LIPASE, BLOOD  MAGNESIUM     EKG: None  Radiology: CT Angio Chest/Abd/Pel for Dissection W and/or Wo Contrast Result Date: 11/01/2023 CLINICAL DATA:  Provided history: Acute aortic syndrome (AAS) suspected Patient reports nausea. EXAM: CT ANGIOGRAPHY CHEST, ABDOMEN AND PELVIS TECHNIQUE: Non-contrast CT of the chest  was initially obtained. Multidetector CT imaging through the chest, abdomen and pelvis was performed using the standard protocol during bolus administration of intravenous contrast. Multiplanar reconstructed images and MIPs were obtained and reviewed to evaluate the vascular anatomy. RADIATION DOSE REDUCTION: This exam was performed according to the departmental dose-optimization program which includes automated exposure control, adjustment of the mA and/or kV according to patient size and/or use of iterative reconstruction technique. CONTRAST:  OMNIPAQUE  IOHEXOL  350 MG/ML SOLN COMPARISON:  Chest CT 03/01/2023 FINDINGS: CTA CHEST FINDINGS Cardiovascular: No aortic hematoma on unenhanced exam. Unchanged 4.4 cm ascending aortic aneurysm. No aortic dissection or acute aortic findings. Mild atherosclerosis. No central pulmonary embolus to the segmental level. The heart is mildly enlarged. No pericardial effusion. Mediastinum/Nodes: Enlarged right lobe of the thyroid  gland with 2.9 cm low-density nodule, stable from prior exam. This has been evaluated on previous imaging. (ref: J Am Coll Radiol. 2015 Feb;12(2): 143-50). Enlarged right axillary nodes there again seen measuring up to 14 mm short axis series 6 image 52. Prominent subpectoral nodes on the right are also  unchanged. No hilar adenopathy. Small hiatal hernia. Lungs/Pleura: No acute airspace disease. No features of pulmonary edema. No pleural effusion. Scattered pulmonary cysts, including an irregular cyst in the right lower lobe with peripheral nodular calcification, unchanged from prior. No suspicious pulmonary nodule. Scattered calcified granuloma again seen. Musculoskeletal: There are no acute or suspicious osseous abnormalities. Review of the MIP images confirms the above findings. CTA ABDOMEN AND PELVIS FINDINGS VASCULAR Aorta: Normal caliber aorta without aneurysm, dissection, vasculitis or significant stenosis. Mild aortic atherosclerosis and tortuosity. Celiac: Patent without evidence of aneurysm, dissection, vasculitis or significant stenosis. SMA: Patent without evidence of aneurysm, dissection, vasculitis or significant stenosis. Renals: 2 right and 2 left renal arteries. All renal arteries are patent without evidence of aneurysm, dissection, vasculitis, fibromuscular dysplasia or significant stenosis. IMA: Patent without evidence of aneurysm, dissection, vasculitis or significant stenosis. Inflow: Patent without evidence of aneurysm, dissection, vasculitis or significant stenosis. Veins: No obvious venous abnormality within the limitations of this arterial phase study. Review of the MIP images confirms the above findings. NON-VASCULAR Hepatobiliary: No focal liver lesion. Question subtle capsular nodularity of the left lobe of the liver. Cholecystectomy without biliary dilatation. Pancreas: No ductal dilatation or inflammation. Spleen: Normal in size and arterial enhancement. Adrenals/Urinary Tract: Normal adrenal glands. Bilateral renal parenchymal scarring. No hydronephrosis or evidence of renal inflammation. Unremarkable urinary bladder. Stomach/Bowel: Small hiatal hernia. Areas of enhancement of small bowel in the pelvis with mild associated wall thickening. No obstruction. Lipomatous hypertrophy of the  ileocecal valve. The appendix is not definitively seen, no appendicitis. Small volume of formed stool in the colon. Lymphatic: No lymphadenopathy. Reproductive: Status post hysterectomy. No adnexal masses. Other: No free air, free fluid, or intra-abdominal fluid collection. Small fat containing umbilical hernia. Musculoskeletal: Lumbar degenerative change with prominent facet hypertrophy. There are no acute or suspicious osseous abnormalities. Review of the MIP images confirms the above findings. IMPRESSION: 1. No aortic dissection or acute aortic abnormality. 2. Stable ascending aortic aneurysm at 4.4 cm. Recommend annual imaging followup by CTA or MRA. This recommendation follows 2010 ACCF/AHA/AATS/ACR/ASA/SCA/SCAI/SIR/STS/SVM Guidelines for the Diagnosis and Management of Patients with Thoracic Aortic Disease. Circulation. 2010; 121: Z733-z630. Aortic aneurysm NOS (ICD10-I71.9) 3. Areas of enhancement of small bowel in the pelvis with mild associated wall thickening, suspicious for enteritis. 4. Question subtle capsular nodularity of the left lobe of the liver, can be seen with cirrhosis. 5. Unchanged enlarged right axillary  and subpectoral lymph nodes, likely reactive given stability. Aortic Atherosclerosis (ICD10-I70.0). Electronically Signed   By: Andrea Gasman M.D.   On: 11/01/2023 22:51   MR BRAIN WO CONTRAST Result Date: 11/01/2023 EXAM: MR Brain Without Intravenous Contrast. CLINICAL HISTORY: Neuro deficit, acute, stroke suspected. TECHNIQUE: Magnetic resonance images of the brain without intravenous contrast in multiple planes. CONTRAST: Without. COMPARISON: CT Head dated 08/31/2022. MR Head dated 07/30/2018. FINDINGS: BRAIN: No restricted diffusion to indicate acute infarction. No intracranial mass or hemorrhage. No midline shift or extra-axial fluid collection. No cerebellar tonsillar ectopia. The central arterial and venous flow voids are patent. Mild atrophy and moderate white matter changes  demonstrate some progression since the prior MRI. VENTRICLES: No hydrocephalus. ORBITS: The orbits are normal. SINUSES AND MASTOIDS: The sinuses and mastoid air cells are clear. BONES: No acute fracture or focal osseous lesion. Mild degenerative changes are present in the upper cervical spine. IMPRESSION: 1. No acute findings. 2. Mild atrophy and moderate white matter changes demonstrate some progression since the prior MRI. This most likely reflects the sequelae of chronic microvascular ischemia. Electronically signed by: Lonni Necessary MD 11/01/2023 07:16 PM EDT RP Workstation: [id number]     Procedures   Medications Ordered in the ED  potassium chloride  (KLOR-CON ) packet 60 mEq (60 mEq Oral Given 11/01/23 2125)  sodium chloride  0.9 % bolus 1,000 mL (0 mLs Intravenous Stopped 11/01/23 2054)  iohexol  (OMNIPAQUE ) 350 MG/ML injection 100 mL (100 mLs Intravenous Contrast Given 11/01/23 2214)                                    Medical Decision Making Amount and/or Complexity of Data Reviewed Labs: ordered. Radiology: ordered.  Risk Prescription drug management.   83 yo F with multiple complaints.  She tells me that she think she has colitis and also had a episode that lasted for a couple days where she felt like she was completely lost her vision and was unable to walk.  This has gotten better but she does not feel like she is back to normal.  It is difficult to get a good history from the patient.  She has trouble describing what she is feeling.  She is worried that maybe she has colitis again, she also is worried that her thoracic aortic aneurysm may have worsened.   Lab work is largely reassuring.  No leukocytosis no significant electrolyte abnormalities.  Will add on a mag level give a bolus of IV fluids.  Patient with abdominal and neurologic symptoms I think less likely to be dissection but does have a history of a thoracic aortic aneurysm, on my record review last measured was  4.4 cm.  Will obtain a CT angiogram of the chest.  With unexplained neurologic symptoms we will obtain an MRI.  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  When I discussed this with the patient she thought that the sensation was different than it typically is.  MRI is negative for acute pathology.  CT dissection study without change to her thoracic aortic aneurysm.  Radiology read as possible enteritis.  Prescription for Zofran .  Imodium for diarrhea.  PCP follow-up.  10:56 PM:  I have discussed the diagnosis/risks/treatment options with the patient.  Evaluation and diagnostic testing in the emergency department does not suggest an emergent condition requiring admission or immediate intervention  beyond what has been performed at this time.  They will follow up with PCP. We also discussed returning to the ED immediately if new or worsening sx occur. We discussed the sx which are most concerning (e.g., sudden worsening pain, fever, inability to tolerate by mouth) that necessitate immediate return. Medications administered to the patient during their visit and any new prescriptions provided to the patient are listed below.  Medications given during this visit Medications  potassium chloride  (KLOR-CON ) packet 60 mEq (60 mEq Oral Given 11/01/23 2125)  sodium chloride  0.9 % bolus 1,000 mL (0 mLs Intravenous Stopped 11/01/23 2054)  iohexol  (OMNIPAQUE ) 350 MG/ML injection 100 mL (100 mLs Intravenous Contrast Given 11/01/23 2214)     The patient appears reasonably screen and/or stabilized for discharge and I doubt any other medical condition or other [facility] requiring further screening, evaluation, or treatment in the ED at this time prior to discharge.       Final diagnoses:  Diarrhea, unspecified type    ED Discharge Orders          Ordered    ondansetron  (ZOFRAN -ODT) 4 MG disintegrating tablet         11/01/23 2255               Emil Share, DO 11/01/23 2256
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
## 2023-11-01 NOTE — Therapy (Incomplete)
 OUTPATIENT PHYSICAL THERAPY NEURO TREATMENT - DISCHARGE SUMMARY AND RECERTIFICATION   Patient Name: KAMBRIA GRIMA MRN: [id number] DOB:[birth year], 83 y.o., female Today's Date: 11/01/2023   PCP: Campbell Reynolds, NP REFERRING PROVIDER: Leigh Venetia LITTIE, MD  PHYSICAL THERAPY DISCHARGE SUMMARY  Visits from Start of Care: ***  Current functional level related to goals / functional outcomes: ***   Remaining deficits: ***   Education / Equipment: ***   Patient agrees to discharge. Patient goals were {OP Goals:25702::met}. Patient is being discharged due to {OP Discharge Reasons:25703::meeting the stated rehab goals.}     END OF SESSION:           Past Medical History:  Diagnosis Date   Acute colitis    Anxiety    B12 deficiency    Bilateral swelling of feet    Constipation    COPD (chronic obstructive pulmonary disease) (HCC)    Depression    DVT (deep venous thrombosis) (HCC) 2014, 2017   Fibroid    Gallbladder problem    Heart valve problem    High cholesterol    History of shingles    Hypertension    IBS (irritable bowel syndrome)    Joint pain    PE (pulmonary thromboembolism) (HCC) 2017   SOB (shortness of breath)    Thyroid  disease    Nodules   Varicose veins    Vitamin D  deficiency    Past Surgical History:  Procedure Laterality Date   ABDOMINAL HYSTERECTOMY     CHOLECYSTECTOMY N/A 11/05/2017   Procedure: LAPAROSCOPIC CHOLECYSTECTOMY WITH INTRAOPERATIVE CHOLANGIOGRAM;  Surgeon: Curvin Deward MOULD, MD;  Location: [facility] OR;  Service: General;  Laterality: N/A;   COLONOSCOPY N/A 10/05/2014   Procedure: COLONOSCOPY;  Surgeon: Lamar Bunk, MD;  Location: [facility] ENDOSCOPY;  Service: Endoscopy;  Laterality: N/A;   ERCP N/A 11/04/2017   Procedure: ENDOSCOPIC RETROGRADE CHOLANGIOPANCREATOGRAPHY (ERCP) Balloon Extraction;  Surgeon: Saintclair Jasper, MD;  Location: [facility] ENDOSCOPY;  Service: Gastroenterology;  Laterality: N/A;   LAPAROSCOPIC CHOLECYSTECTOMY W/  CHOLANGIOGRAPHY  11/05/2017   Dr. Curvin   OOPHORECTOMY     One ovary removed   SPHINCTEROTOMY  11/04/2017   Procedure: SPHINCTEROTOMY;  Surgeon: Saintclair Jasper, MD;  Location: [facility] ENDOSCOPY;  Service: Gastroenterology;;   TOTAL THYROIDECTOMY     Patient Active Problem List   Diagnosis Date Noted   Choledocholithiasis with acute cholecystitis with obstruction 11/02/2017   Chronic kidney disease, stage 3 (HCC) 11/02/2017   DVT (deep venous thrombosis) (HCC) 11/12/2015   Pulmonary emboli (HCC) 11/11/2015   Acute pulmonary embolism (HCC) 11/11/2015   Coronary artery calcification seen on CAT scan 05/12/2015   Thoracic aortic aneurysm (HCC) 05/12/2015   Dyspnea 03/17/2015   Left leg swelling 03/16/2015   Acute colitis 10/04/2014   COPD (chronic obstructive pulmonary disease) (HCC)    PVD (peripheral vascular disease) (HCC) 04/01/2012   Leg pain 04/01/2012   Swelling of limb 04/01/2012   History of shingles    Hypertension    Fibroid    Thyroid  disease    Anxiety     ONSET DATE: 08/23/2023 (referral)   REFERRING DIAG: R26.89 (ICD-10-CM) - Imbalance R29.6 (ICD-10-CM) - Falls  THERAPY DIAG:  No diagnosis found.  Rationale for Evaluation and Treatment: Rehabilitation  SUBJECTIVE:  SUBJECTIVE STATEMENT:  Pt reports that she had a rough week. Saw her PCP on Tuesday and brought in the summary paperwork. Was told to restart all her prescriptions and emphasized need to see a counselor. Has not started her medications yet due to fear of side effects. No falls.    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  No  PRECAUTIONS: Fall  RED FLAGS: None   WEIGHT BEARING RESTRICTIONS: No  FALLS: Has patient fallen in last 6 months? Yes. Number of falls 1  LIVING ENVIRONMENT: Lives with: lives alone Lives in: House/apartment Stairs: Yes: External: 3 steps; on right going up, on left going up, and can reach both Has following equipment at home: Single point cane, shower chair, Grab bars, and has to put shower chair and grab bars together, no installed currently   PLOF: Independent  PATIENT GOALS: I need my balance back and to be able to walk better    OBJECTIVE:  Note: Objective measures were completed at Evaluation unless otherwise noted.  DIAGNOSTIC FINDINGS: CT of head from 08/2022  IMPRESSION: 1. No CT evidence for acute intracranial abnormality. 2. Atrophy and chronic small vessel ischemic changes of the white matter.  COGNITION: Overall cognitive status: Difficulty to assess due to: no family present   SENSATION: Pt reports impaired sensation in bilateral feet and hands    EDEMA: Chronic swelling in LLE - history of DVT in 2019     POSTURE: rounded shoulders, forward head, and increased thoracic kyphosis  LOWER EXTREMITY ROM:     Active  Right Eval Left Eval  Hip flexion    Hip extension    Hip abduction    Hip adduction    Hip internal rotation    Hip external rotation    Knee flexion    Knee extension    Ankle dorsiflexion    Ankle plantarflexion    Ankle inversion    Ankle eversion     (Blank rows = not tested)  LOWER EXTREMITY MMT:  Tested in seated position   MMT Right Eval Left Eval  Hip flexion 3+ 3+  Hip extension    Hip abduction 4 4  Hip adduction 3+ 3+  Hip internal rotation    Hip external rotation    Knee flexion 4- 4-  Knee extension 4- 4-  Ankle dorsiflexion 4 4  Ankle plantarflexion    Ankle inversion    Ankle eversion    (Blank rows = not tested)  BED MOBILITY:  Not tested  TRANSFERS: Sit to stand: SBA  Assistive device  utilized: None     Stand to sit: SBA  Assistive device utilized: None      RAMP:  Not tested  CURB:  Not tested  STAIRS: Not tested GAIT: Gait pattern: step through pattern, decreased arm swing- Right, decreased arm swing- Left, decreased stride length, decreased hip/knee flexion- Right, decreased hip/knee flexion- Left, lateral hip instability, decreased trunk rotation, wide BOS, and poor foot clearance- Left Distance walked: Various clinic distances  Assistive device utilized: None Level of assistance: Modified independence Comments: Pt ambulates w/wide BOS and waddle-like pattern. Decreased cadence. No LOB noted    FUNCTIONAL TESTS:          VITALS  There were no vitals filed for this visit.  TREATMENT:   Self-care/home management  Assessed vitals (see above) in LUE at rest and BP too elevated to participate in PT. Pt reports she understands that anxiety can increase BP and that it plays a large role in her HTN. Encouraged pt to start taking her BP meds today, pt verbalized understanding.  Provided therapeutic listening as pt recalls her rough week and process the recommendation from her PCP to move to an AFL/ILF as she lives alone and has no family nearby. Continue to recommend pt prioritize counseling and herself, as pt has been spread thin traveling to [place] to take care of her sister-in-law. At this time, advised pt to DC from PT to focus on her mental health and potentially moving, as her BP and trips to [place] have been a limiting factor in PT. Pt in agreement with this and states she plans on returning to church next week and building her network back. Pt also waiting on [facility] to call to set up counseling. Pt educated on how to return to PT in future once pt is more settled, pt verbalized understanding.   PATIENT  EDUCATION: Education details: See above, plan to DC next session  Person educated: Patient Education method: Explanation and Demonstration Education comprehension: verbalized understanding, returned demonstration, and needs further education  HOME EXERCISE PROGRAM: Access Code: [email or web address] URL: [email or web address] Date: 09/20/2023 Prepared by: Marlon Kiven Vangilder  Exercises - Seated march over  - 1 x daily - 7 x weekly - 3 sets - 10-15 reps - Side Stepping with Resistance at Ankles and Counter Support  - 1 x daily - 7 x weekly - 2-5 reps  GOALS: Goals reviewed with patient? Yes  SHORT TERM GOALS: Target date: 10/07/2023   Pt will be independent with initial HEP for improved strength, balance, transfers and gait.  Baseline: not established on eval  Goal status: MET  2.  Pt will improve 5 x STS to less than or equal to 25 seconds to demonstrate improved functional strength and transfer efficiency.   Baseline: 30.16s w/BUE support; 19.03s w/intermittent UE support (8/4) Goal status: MET  3.  Pt will improve gait velocity to at least 1.0 m/s for improved gait efficiency and independence    Baseline: 0.91 m/s no AD; 0.96 m/s no AD (8/4)  Goal status: NOT MET  4.  Pt will trial various AD to determine safest option for pt for reduced fall risk  Baseline: pt has SPC that she has not used  Goal status: DC  5.  FGA to be assessed and LTG updated  Baseline: 15/30 (8/8) Goal status: MET  LONG TERM GOALS: Target date: 10/28/2023 (updated to match last scheduled appt date within POC)   Pt will be independent with final HEP for improved strength, balance, transfers and gait.  Baseline:  Goal status: INITIAL  2.  Pt will improve 5 x STS to less than or equal to 17 seconds to demonstrate improved functional strength and transfer efficiency.   Baseline: 30.16s w/BUE support; 19.03s w/intermittent UE support (8/4), 22 sec no UE (8/18) Goal status: REVISED  3.  Pt will  improve gait velocity to at least 1.2 m/s for improved gait efficiency and independence   Baseline: 0.91 m/s no AD (7/18); 0.96 m/s no AD (8/4), 1.07 m/s no AD (8/18) Goal status: IN PROGRESS  4.  Pt will improve FGA to 19/30 for decreased fall risk   Baseline: 15/30 (8/8), 20/30 (8/18) Goal status: MET  5.  Pt will perform floor transfer mod  I for safe fall recovery and proper body mechanics  Baseline:  Goal status: INITIAL   ASSESSMENT:  CLINICAL IMPRESSION: Session limited due to continued uncontrolled HTN. Pt reports she saw PCP on Tuesday and was given new prescriptions for BP and strongly encouraged to set up counseling as soon as possible. Pt has been limited in PT due to uncontrolled HTN and life stressors, often requiring more therapeutic listening than activity during sessions. Pt in agreement to DC from PT next session in order to prioritize counseling and herself, as she is considering moving to an ILF/ALF and selling her house. Continue POC.    OBJECTIVE IMPAIRMENTS: Abnormal gait, decreased activity tolerance, decreased balance, decreased cognition, decreased endurance, decreased knowledge of condition, decreased knowledge of use of DME, decreased mobility, difficulty walking, decreased strength, decreased safety awareness, increased edema, impaired sensation, improper body mechanics, and pain  ACTIVITY LIMITATIONS: carrying, lifting, bending, standing, squatting, stairs, transfers, hygiene/grooming, locomotion level, and caring for others  PARTICIPATION LIMITATIONS: meal prep, cleaning, laundry, medication management, interpersonal relationship, driving, shopping, community activity, and yard work  PERSONAL FACTORS: Age, Fitness, Past/current experiences, Transportation, and 1-2 comorbidities: mixed connective tissue disease and polyneuropathy are also affecting patient's functional outcome.   REHAB POTENTIAL: Good  CLINICAL DECISION MAKING: Evolving/moderate  complexity  EVALUATION COMPLEXITY: Moderate  PLAN:  PT FREQUENCY: 2x/week  PT DURATION: 6 weeks  PLANNED INTERVENTIONS: 97164- PT Re-evaluation, 97750- Physical Performance Testing, 97110-Therapeutic exercises, 97530- Therapeutic activity, [id number]- Neuromuscular re-education, 97535- Self Care, [id number]- Manual therapy, [id number]- Gait training, [phone]- Canalith repositioning, [id number]- Aquatic Therapy, [phone]- Electrical stimulation (manual), [phone] (1-2 muscles), [id number] (3+ muscles)- Dry Needling, Patient/Family education, Balance training, Stair training, Joint mobilization, Spinal mobilization, Vestibular training, and DME instructions  PLAN FOR NEXT SESSION: goals and DC. Give pt Audrick Lamoureaux's card    Marlon BRAVO Ebonique Hallstrom, PT, DPT  11/01/2023, 10:38 AM

## 2023-11-05 ENCOUNTER — Ambulatory Visit: Admitting: Physical Therapy

## 2023-11-08 ENCOUNTER — Ambulatory Visit: Admitting: Physical Therapy

## 2023-11-08 ENCOUNTER — Encounter: Payer: Self-pay | Admitting: Physical Therapy

## 2023-11-08 ENCOUNTER — Telehealth: Payer: Self-pay | Admitting: Physical Therapy

## 2023-11-08 NOTE — Telephone Encounter (Signed)
 PHYSICAL THERAPY DISCHARGE SUMMARY  Visits from Start of Care: 11  Current functional level related to goals / functional outcomes: Pt is mod I w/all ADLs without use of AD   Remaining deficits: Bilateral peripheral neuropathy, increased fall risk     Education / Equipment: HEP   Patient agrees to discharge. Patient goals were partially met. Patient is being discharged due to uncontrolled HTN and need to prioritize behavioral health. Therapist called and spoke to pt regarding plan to DC, which pt in agreement with. Pt reports she did get her paperwork in to start counseling so is waiting on her first counseling session. Pt states she was very sick last week, recalling how confused she was and how she needed to go to the hospital. At this time, pt greatly limited by anxiety and uncontrolled BP and is encouraged to focus on medical management prior to returning to PT. Pt in agreement with this and verbalized appreciation for therapist calling her.   Margareth Kanner E Shakelia Scrivner, PT, DPT

## 2023-11-11 ENCOUNTER — Ambulatory Visit: Admitting: Physical Therapy

## 2023-11-15 ENCOUNTER — Ambulatory Visit: Admitting: Physical Therapy

## 2023-11-18 ENCOUNTER — Ambulatory Visit: Admitting: Physical Therapy

## 2023-11-22 ENCOUNTER — Ambulatory Visit: Admitting: Physical Therapy

## 2023-12-09 NOTE — Progress Notes (Signed)
 Office Visit Note  Patient: Denise Cline             Date of Birth: 07-19-1940           MRN: [id number]             PCP: Bertrum Charlie LITTIE, MD Referring: Campbell Reynolds, NP Visit Date: 12/23/2023 Occupation: Data Unavailable  Subjective:  Joint stiffness   History of Present Illness: Denise Cline is a 83 y.o. female with history of mixed connective tissue disease.  Patient is not currently taking immunosuppressive agents.  She has not noticed any new or worsening symptoms since her last office visit.  She remains under the tremendous amount of stress currently caring for her brother who lives in [place].  She has noticed intermittent oral ulcers but denies any nasal ulcerations.  She has not any recent rashes.  She continues to experience stiffness in both hands as well as some discomfort and instability in both knees.  Her knee joint pain and stiffness is worse after sitting for prolonged periods of time or when climbing steps.  She has been taking Tylenol  arthritis 2 tablets twice daily for symptomatic relief.  Patient reports that overall her pain levels have been manageable.  She denies any joint swelling.  She continues have intermittent symptoms of Raynaud's phenomenon.   Activities of Daily Living:  Patient reports morning stiffness for less than 1 hour.   Patient Reports nocturnal pain.  Difficulty dressing/grooming: Denies Difficulty climbing stairs: Reports Difficulty getting out of chair: Reports Difficulty using hands for taps, buttons, cutlery, and/or writing: Reports  Review of Systems  Constitutional:  Positive for fatigue.  HENT:  Positive for mouth sores and mouth dryness.   Eyes:  Positive for dryness.  Cardiovascular:  Positive for palpitations. Negative for chest pain.  Gastrointestinal:  Positive for constipation and diarrhea. Negative for blood in stool.  Endocrine: Negative for increased urination.  Genitourinary:  Positive for involuntary  urination.  Musculoskeletal:  Positive for joint pain, gait problem, joint pain, joint swelling, myalgias, muscle weakness, morning stiffness, muscle tenderness and myalgias.  Skin:  Positive for hair loss. Negative for color change, rash and sensitivity to sunlight.  Allergic/Immunologic: Positive for susceptible to infections.  Neurological:  Negative for dizziness and headaches.  Hematological:  Negative for swollen glands.  Psychiatric/Behavioral:  Positive for depressed mood and sleep disturbance. The patient is nervous/anxious.     PMFS History:  Patient Active Problem List   Diagnosis Date Noted   PTSD (post-traumatic stress disorder) 12/11/2023   Choledocholithiasis with acute cholecystitis with obstruction 11/02/2017   Chronic kidney disease, stage 3 (HCC) 11/02/2017   DVT (deep venous thrombosis) (HCC) 11/12/2015   Pulmonary emboli (HCC) 11/11/2015   Acute pulmonary embolism (HCC) 11/11/2015   Coronary artery calcification seen on CAT scan 05/12/2015   Thoracic aortic aneurysm 05/12/2015   Dyspnea 03/17/2015   Left leg swelling 03/16/2015   Acute colitis 10/04/2014   COPD (chronic obstructive pulmonary disease) (HCC)    PVD (peripheral vascular disease) (HCC) 04/01/2012   Leg pain 04/01/2012   Swelling of limb 04/01/2012   History of shingles    Hypertension    Fibroid    Thyroid  disease    Anxiety     Past Medical History:  Diagnosis Date   Acute colitis    Anxiety    B12 deficiency    Bilateral swelling of feet    Constipation    COPD (chronic obstructive pulmonary disease) (HCC)  Depression    DVT (deep venous thrombosis) (HCC) 2014, 2017   Fibroid    Gallbladder problem    Heart valve problem    High cholesterol    History of shingles    Hypertension    IBS (irritable bowel syndrome)    Joint pain    Neuropathy    PE (pulmonary thromboembolism) (HCC) 2017   SOB (shortness of breath)    Thyroid  disease    Nodules   Varicose veins    Vitamin D   deficiency     Family History  Problem Relation Age of Onset   Crohn's disease Mother    Alzheimer's disease Mother    Hypertension Father    Stroke Father    Sudden death Father    Aneurysm Father    Cancer Sister    Diabetes Sister    Hypertension Sister    Prostate cancer Brother    Stroke Brother    Drug abuse Son    Other Son        overdose in 2019   Other Son        brain mass   Colon cancer Son    Past Surgical History:  Procedure Laterality Date   ABDOMINAL HYSTERECTOMY     CHOLECYSTECTOMY N/A 11/05/2017   Procedure: LAPAROSCOPIC CHOLECYSTECTOMY WITH INTRAOPERATIVE CHOLANGIOGRAM;  Surgeon: Curvin Deward MOULD, MD;  Location: [facility];  Service: General;  Laterality: N/A;   COLONOSCOPY N/A 10/05/2014   Procedure: COLONOSCOPY;  Surgeon: Lamar Bunk, MD;  Location: [facility] ENDOSCOPY;  Service: Endoscopy;  Laterality: N/A;   ERCP N/A 11/04/2017   Procedure: ENDOSCOPIC RETROGRADE CHOLANGIOPANCREATOGRAPHY (ERCP) Balloon Extraction;  Surgeon: Saintclair Jasper, MD;  Location: [facility] ENDOSCOPY;  Service: Gastroenterology;  Laterality: N/A;   LAPAROSCOPIC CHOLECYSTECTOMY W/ CHOLANGIOGRAPHY  11/05/2017   Dr. Curvin   OOPHORECTOMY     One ovary removed   SPHINCTEROTOMY  11/04/2017   Procedure: SPHINCTEROTOMY;  Surgeon: Saintclair Jasper, MD;  Location: [facility] ENDOSCOPY;  Service: Gastroenterology;;   TOTAL THYROIDECTOMY     Social History   Tobacco Use   Smoking status: Former    Current packs/day: 0.00    Average packs/day: 2.0 packs/day for 40.0 years (80.0 ttl pk-yrs)    Types: Cigarettes    Start date: 05/30/1968    Quit date: 05/30/2008    Years since quitting: 15.5    Passive exposure: Current (minimal)   Smokeless tobacco: Former    Quit date: 05/12/2008  Vaping Use   Vaping status: Never Used  Substance Use Topics   Alcohol  use: Yes    Comment: Rare   Drug use: No   Social History   Social History Narrative   Are you right handed or left handed? Left hand   Are you currently employed ?     What is your current occupation? retired   Do you live at home alone? no   Who lives with you? Son    What type of home do you live in: 1 story or 2 story? one    Caffeine 1 cup a day     Immunization History  Administered Date(s) Administered   Influenza Split 02/21/2015   PFIZER(Purple Top)SARS-COV-2 Vaccination 02/23/2020     Objective: Vital Signs: BP 130/82   Pulse (!) 57   Temp 97.7 F (36.5 C)   Resp 14   Ht 5' 6 (1.676 m)   Wt 183 lb 9.6 oz (83.3 kg)   BMI 29.63 kg/m    Physical Exam Vitals and  nursing note reviewed.  Constitutional:      Appearance: She is well-developed.  HENT:     Head: Normocephalic and atraumatic.  Eyes:     Conjunctiva/sclera: Conjunctivae normal.  Cardiovascular:     Rate and Rhythm: Normal rate and regular rhythm.     Heart sounds: Normal heart sounds.  Pulmonary:     Effort: Pulmonary effort is normal.     Breath sounds: Normal breath sounds.  Abdominal:     General: Bowel sounds are normal.     Palpations: Abdomen is soft.  Musculoskeletal:     Cervical back: Normal range of motion.  Lymphadenopathy:     Cervical: No cervical adenopathy.  Skin:    General: Skin is warm and dry.     Capillary Refill: Capillary refill takes less than 2 seconds.  Neurological:     Mental Status: She is alert and oriented to person, place, and time.  Psychiatric:        Behavior: Behavior normal.      Musculoskeletal Exam: C-spine, thoracic spine, lumbar spine have good range of motion.  No midline spinal tenderness.  No SI joint tenderness.  Shoulder joints, elbow joints, wrist joints, MCPs, PIPs, DIPs have good range of motion with no synovitis.  CMC, PIP, DIP thickening consistent with osteoarthritis of both hands.  Complete fist formation bilaterally.  Hip joints have good range of motion with no groin pain.  Knee joints have good range of motion no warmth or effusion.  Ankle joints have good range of motion no tenderness or joint swelling.   No evidence of Achilles tendinitis or plantar fasciitis.   CDAI Exam: CDAI Score: -- Patient Global: --; Provider Global: -- Swollen: --; Tender: -- Joint Exam 12/23/2023   No joint exam has been documented for this visit   There is currently no information documented on the homunculus. Go to the Rheumatology activity and complete the homunculus joint exam.  Investigation: No additional findings.  Imaging: No results found.  Recent Labs: Lab Results  Component Value Date   WBC 6.2 11/01/2023   HGB 12.9 11/01/2023   PLT 274 11/01/2023   NA 137 11/01/2023   K 3.5 11/01/2023   CL 100 11/01/2023   CO2 25 11/01/2023   GLUCOSE 108 (H) 11/01/2023   BUN 21 11/01/2023   CREATININE 0.94 11/01/2023   BILITOT 0.5 11/01/2023   ALKPHOS 47 11/01/2023   AST 21 11/01/2023   ALT 14 11/01/2023   PROT 7.1 11/01/2023   ALBUMIN 3.5 11/01/2023   CALCIUM  9.1 11/01/2023   GFRAA 69 03/09/2019    Speciality Comments: No specialty comments available.  Procedures:  No procedures performed Allergies: Penicillins, Sulfa  antibiotics, Ace inhibitors, Atorvastatin, Citrus, Cymbalta [duloxetine hcl], Oxycodone, Sulfamethoxazole -trimethoprim , and Vicodin [hydrocodone-acetaminophen ]   Assessment / Plan:     Visit Diagnoses: MCTD (mixed connective tissue disease) -Positive ANA, positive RNP, positive SSA, positive dsDNA, positive RF, elevated sedimentation rate: She is not currently taking immunosuppressive agents.  She has declined the use of Plaquenil in the past.  She has not noticed any new or worsening symptoms since her last office visit.  She continues to have intermittent symptoms of Raynaud's phenomenon, occasional oral ulcers, and stiffness involving both hands and both knee joints.  She has no synovitis on examination today.  She has not had any recent rashes.  She continues to have chronic sicca symptoms.  She is apprehensive to make any medication changes at this time. Reviewed lab results  from 06/20/2023: ANA  remains positive, double-stranded DNA 7, complements within normal limits, ESR 39, CMP within normal limits, CBC within normal limits, urine protein creatinine ratio within normal limits, Ro antibody remains positive, RNP antibody remains positive.  Plan to update the following lab work for close [facility] out the orders on a prescription pad since she would like to have all labs drawn tomorrow by her PCP.  Plan to call to discuss results once completed.  She would like to hold off on starting Plaquenil or any other medications at this time.  She will notify us  if she develops any new or worsening symptoms.  She will follow-up in the office in 6 months or sooner if needed.  Pain in both hands: She continues to experience stiffness involving both hands but has no synovitis on exam.  CMC, PIP, DIP thickening consistent with osteoarthritis of both hands noted.  Discussed the importance of joint protection and muscle strengthening.  She has been taking Tylenol  arthritis 2 tablets twice daily for symptomatic relief.  Chronic pain of both knees: Patient continues to experience intermittent discomfort and stiffness involving both knees.  She is difficulty rising from a seated position after sitting for prolonged peers of time.  She also has difficulty climbing steps.  On examination no warmth or effusion was noted.  Overall her symptoms have been manageable with use of Tylenol  arthritis.  Primary osteoarthritis of both hips - X-rays of both hips from 01/23/2023 were consistent with osteoarthritis.  No groin pain currently.   Pain in both feet: She has good ROM of both ankles with no tenderness or joint swelling.   Other medical conditions are listed as follows:  Stage 3a chronic kidney disease (HCC)  History of COPD  Calcified granuloma of lung - CT angio chest 01/25/22: L3 mm superior segment left lower lobe nodule, unchanged, benign, calcified granulomas  Coronary artery  calcification seen on CAT scan  Former smoker - - Quit smoking May 12, 2008.  Smoked 1 to 2 pack/day for 50 years.  Pulmonary hypertension (HCC)  Acute deep vein thrombosis (DVT) of femoral vein of left lower extremity (HCC)  Pulmonary embolism without acute cor pulmonale, unspecified chronicity, unspecified pulmonary embolism type (HCC)  PVD (peripheral vascular disease)  Thoracic aortic aneurysm without rupture, unspecified part  Primary hypertension: Blood pressure was 130/80 today in the office.  Choledocholithiasis with acute cholecystitis with obstruction  Thyroid  disease  Anxiety and depression  Family history of systemic lupus erythematosus-grandson and niece  Neuropathy  Orders: No orders of the defined types were placed in this encounter.  No orders of the defined types were placed in this encounter.   Follow-Up Instructions: Return in about 6 months (around 06/22/2024) for MCTD.   Waddell CHRISTELLA Craze, PA-C  Note - This record has been created using Dragon software.  Chart creation errors have been sought, but may not always  have been located. Such creation errors do not reflect on  the standard of medical care.

## 2023-12-10 ENCOUNTER — Ambulatory Visit: Admitting: Licensed Clinical Social Worker

## 2023-12-10 DIAGNOSIS — F4310 Post-traumatic stress disorder, unspecified: Secondary | ICD-10-CM

## 2023-12-10 DIAGNOSIS — F4323 Adjustment disorder with mixed anxiety and depressed mood: Secondary | ICD-10-CM | POA: Diagnosis not present
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
## 2023-12-11 DIAGNOSIS — F4310 Post-traumatic stress disorder, unspecified: Secondary | ICD-10-CM | POA: Insufficient documentation

## 2023-12-23 ENCOUNTER — Ambulatory Visit: Attending: Physician Assistant | Admitting: Physician Assistant

## 2023-12-23 ENCOUNTER — Encounter: Payer: Self-pay | Admitting: Physician Assistant

## 2023-12-23 VITALS — BP 130/82 | HR 57 | Temp 97.7°F | Resp 14 | Ht 66.0 in | Wt 183.6 lb

## 2023-12-23 DIAGNOSIS — I2699 Other pulmonary embolism without acute cor pulmonale: Secondary | ICD-10-CM

## 2023-12-23 DIAGNOSIS — I739 Peripheral vascular disease, unspecified: Secondary | ICD-10-CM

## 2023-12-23 DIAGNOSIS — M160 Bilateral primary osteoarthritis of hip: Secondary | ICD-10-CM

## 2023-12-23 DIAGNOSIS — M25561 Pain in right knee: Secondary | ICD-10-CM

## 2023-12-23 DIAGNOSIS — Z8709 Personal history of other diseases of the respiratory system: Secondary | ICD-10-CM

## 2023-12-23 DIAGNOSIS — Z87891 Personal history of nicotine dependence: Secondary | ICD-10-CM

## 2023-12-23 DIAGNOSIS — F32A Depression, unspecified: Secondary | ICD-10-CM

## 2023-12-23 DIAGNOSIS — G629 Polyneuropathy, unspecified: Secondary | ICD-10-CM

## 2023-12-23 DIAGNOSIS — M79672 Pain in left foot: Secondary | ICD-10-CM

## 2023-12-23 DIAGNOSIS — I2510 Atherosclerotic heart disease of native coronary artery without angina pectoris: Secondary | ICD-10-CM

## 2023-12-23 DIAGNOSIS — M351 Other overlap syndromes: Secondary | ICD-10-CM

## 2023-12-23 DIAGNOSIS — M79641 Pain in right hand: Secondary | ICD-10-CM

## 2023-12-23 DIAGNOSIS — Z8269 Family history of other diseases of the musculoskeletal system and connective tissue: Secondary | ICD-10-CM

## 2023-12-23 DIAGNOSIS — I272 Pulmonary hypertension, unspecified: Secondary | ICD-10-CM

## 2023-12-23 DIAGNOSIS — J984 Other disorders of lung: Secondary | ICD-10-CM

## 2023-12-23 DIAGNOSIS — F419 Anxiety disorder, unspecified: Secondary | ICD-10-CM

## 2023-12-23 DIAGNOSIS — M79642 Pain in left hand: Secondary | ICD-10-CM

## 2023-12-23 DIAGNOSIS — N1831 Chronic kidney disease, stage 3a: Secondary | ICD-10-CM

## 2023-12-23 DIAGNOSIS — M25562 Pain in left knee: Secondary | ICD-10-CM

## 2023-12-23 DIAGNOSIS — E079 Disorder of thyroid, unspecified: Secondary | ICD-10-CM

## 2023-12-23 DIAGNOSIS — M79671 Pain in right foot: Secondary | ICD-10-CM

## 2023-12-23 DIAGNOSIS — I82412 Acute embolism and thrombosis of left femoral vein: Secondary | ICD-10-CM

## 2023-12-23 DIAGNOSIS — G8929 Other chronic pain: Secondary | ICD-10-CM

## 2023-12-23 DIAGNOSIS — K8043 Calculus of bile duct with acute cholecystitis with obstruction: Secondary | ICD-10-CM

## 2023-12-23 DIAGNOSIS — I10 Essential (primary) hypertension: Secondary | ICD-10-CM

## 2023-12-23 DIAGNOSIS — I712 Thoracic aortic aneurysm, without rupture, unspecified: Secondary | ICD-10-CM

## 2023-12-26 ENCOUNTER — Telehealth: Payer: Self-pay | Admitting: *Deleted

## 2023-12-26 NOTE — Telephone Encounter (Signed)
 Left message to advise patient Okay to complete lab work at upcoming visit on 01/10/2024

## 2023-12-26 NOTE — Telephone Encounter (Signed)
 Patient contacted the office and left a message stating her appointment with Dr. Bertrum was moved to 01/10/2024. Patient states she would like to know if she needs to have the labs done prior to that. Please advise.

## 2023-12-26 NOTE — Telephone Encounter (Signed)
 Okay to complete lab work at upcoming visit on 01/10/2024

## 2023-12-31 ENCOUNTER — Ambulatory Visit: Admitting: Licensed Clinical Social Worker

## 2024-01-14 ENCOUNTER — Other Ambulatory Visit: Payer: Self-pay | Admitting: Surgery

## 2024-01-14 DIAGNOSIS — I712 Thoracic aortic aneurysm, without rupture, unspecified: Secondary | ICD-10-CM

## 2024-01-28 ENCOUNTER — Ambulatory Visit: Admitting: Licensed Clinical Social Worker

## 2024-02-03 ENCOUNTER — Ambulatory Visit: Admitting: Licensed Clinical Social Worker

## 2024-02-03 DIAGNOSIS — F4312 Post-traumatic stress disorder, chronic: Secondary | ICD-10-CM

## 2024-02-03 DIAGNOSIS — F4310 Post-traumatic stress disorder, unspecified: Secondary | ICD-10-CM
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
## 2024-02-05 ENCOUNTER — Ambulatory Visit (HOSPITAL_COMMUNITY)
Admission: RE | Admit: 2024-02-05 | Discharge: 2024-02-05 | Disposition: A | Source: Ambulatory Visit | Attending: Surgery | Admitting: Surgery

## 2024-02-05 DIAGNOSIS — I712 Thoracic aortic aneurysm, without rupture, unspecified: Secondary | ICD-10-CM | POA: Insufficient documentation

## 2024-02-05 MED ORDER — IOHEXOL 350 MG/ML SOLN
75.0000 mL | Freq: Once | INTRAVENOUS | Status: AC | PRN
  Administered 2024-02-05: 75 mL via INTRAVENOUS

## 2024-02-11 NOTE — Progress Notes (Unsigned)
 NEUROLOGY FOLLOW UP OFFICE NOTE  Denise Cline [id number]  Subjective:  Denise Cline is a 83 y.o. 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  To briefly review: 02/20/23: Patient has had symptoms for 5-10 years. She describes pain, numbness, tingling, and burning in her legs and hands. It started very slowly and has been slowly progressive. She now has difficulty walking with imbalance. She denies any falls, but has near falls requiring her to catch herself. She does not use an assistive device to help her walk. She takes tramadol  and tylenol  for pain. She has not previously tried gabapentin  and Lyrica. She has had electric stimulation therapy previously, which may have helped for a short period of time. She does not remember ever having an EMG.   Patient has occasional tremor in her hands and mouth. These has been going on about 6 months. She thinks it is at rest and intermittent. She thinks it is worse with stress. It does not last long. Her handwriting is worse, but not small. She thinks her smell may not be as good as normal. She denies clear freezing while walking. She is not aware of clear REM sleep disorder, but she does wake up with all the covers removed.   She has not had recent PT. She mentions that she is supposed to start PT at the beginning of the year. This has been ordered by PCP.   Patient is followed in rheumatology by Dr. Dolphus for mixed connective tissue disease (last seen 02/19/23). Per that note: Visit Diagnoses: MCTD (mixed connective tissue disease) (HCC) - Positive ANA, positive RNP, positive SSA, positive dsDNA, positive RF, elevated sedimentation rate: I did detailed discussion with the patient regarding the lab results.  Complements are normal and sed  rate is mildly elevated.  Signs dry mouth she does not have any other symptoms of autoimmune disease.  She complains of joint pain but no synovitis was noted.  She has osteoarthritis involving multiple joints.  I discussed possible use of hydroxychloroquine.  Side effects of hydroxychloroquine were discussed at length.  Patient states she is uncertain if she wants to take any medications for mixed connective tissue disease as she does not have much symptoms.  I provided her with a handout to review.  I also advised her to contact us  if she develops any new symptoms.   Pain in both hands -she complains of discomfort in her bilateral hands.  No synovitis was noted.  Bilateral CMC PIP and DIP thickening was noted.  X-rays obtained at the last visit of bilateral hands were suggestive of osteoarthritis.  X-ray findings were reviewed with the patient.  A handout on hand exercises was provided.   Chronic pain of both knees -she complains of discomfort in the bilateral knee joints.  No warmth swelling or effusion was noted.  X-rays obtained at the last visit showed bilateral knee joint moderate osteoarthritis.  Detail counseled regarding osteoarthritis was provided.  A handout on lower extremity exercises was given.   Pain in right hip -she complains of right hip joint.  X-rays obtained at the last visit showed osteoarthritic changes.  Results were reviewed with the patient.   Pain in both feet-she complains of discomfort in her feet.  No synovitis was noted.  X-rays were suggestive of osteoarthritis.  She does not report any constitutional symptoms like fever or unintentional weight loss.   EtOH use: None currently, but did many years ago  Restrictive diet? She does not eat often for many years (maybe 1 meal and an ensure each day) Family history of neuropathy/myopathy/NM disease? Mother with Alzheimer  08/23/23: Patient has lost a lot of family, including her son. She stopped taking all of her medications  due to some argument that she had with him. She has not told anyone about stopping her medications.   She has had one fall since our last visit. She felt like her left leg gave out. She did not hurt herself.   She is having numbness, tingling, and pain in legs and pain.   She has not done PT.  Most recent Assessment and Plan (08/23/23): This is Denise Cline, a 83 y.o. female with: Numbness and tingling in hands and feet, imbalance, falls. Patient's symptoms are likely multifactorial with contributions from arthritis, PVD, lower extremity edema, and peripheral neuropathy. 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 This is likely a major contributor to her imbalance. Her known risk factors include mixed connective tissue disease (sensory neuronopathy associated with Sjogren's and SSA positivity), pre-DM and possible prior heavy EtOH use, though this was many years ago. She was to do PT after last visit, but never scheduled it. She has had 1 fall since 02/20/23. Depression - this was the major issue discussed today. Patient appears very distraught about recent deaths in her family and arguments with family. Per patient, she has stopped all of her medications and is no longer taking care of herself. She denies SI. She would like help with her mental health.   Plan: -Patient promised to make appointment with PCP to discuss medications as she states she has stopped all of them -Physical therapy for imbalance and falls -Lidocaine  cream as needed -Patient not interested in taking medications at this time for neuropathy -Referral to behavioral health  Since their last visit: Her numbness and tingling are terrible. She feels it is hard to open bottles. She has arthritic pain as well. She has not had any recent falls, may have had a fall around time of last appointment when tripping over a dog.  Patient went to PT, but tells me she was thrown out because her BP was too  high.     MEDICATIONS:  Outpatient Encounter Medications as of 02/20/2024  Medication Sig   acetaminophen  (TYLENOL ) 650 MG CR tablet Take 1,300 mg by mouth 2 (two) times a day.   losartan  (COZAAR ) 100 MG tablet Take 100 mg by mouth daily.   Magnesium  250 MG TABS Take by mouth.   Multiple Vitamins-Minerals (CENTRUM SILVER ADULT 50+) TABS Take 1 tablet by mouth daily.   rivaroxaban  (XARELTO ) 20 MG TABS tablet Take 20 mg by mouth daily with supper.   traMADol  (ULTRAM ) 50 MG tablet Take 1 tablet (50 mg total) by mouth every 6 (six) hours as needed for moderate pain.   triamcinolone  (KENALOG ) 0.025 % cream Apply 1 application topically daily as needed for itching.   aspirin  EC 81 MG tablet Take 81 mg by mouth daily. Swallow whole. (Patient not taking: Reported on 02/20/2024)   Cholecalciferol (VITAMIN D3) 125 MCG (5000 UT) CAPS Take by mouth. (Patient not taking: Reported on 02/20/2024)   Colesevelam HCl 3.75 g PACK Take by mouth. (Patient not taking: Reported on 02/20/2024)   hydrochlorothiazide  (HYDRODIURIL ) 25 MG tablet Take 25 mg by mouth  daily.  (Patient not taking: Reported on 02/20/2024)   lidocaine  (LIDODERM ) 5 % Place 1 patch onto the skin daily. Remove & Discard patch within 12 hours or as directed by MD (Patient not taking: Reported on 02/20/2024)   ondansetron  (ZOFRAN -ODT) 4 MG disintegrating tablet 4mg  ODT q4 hours prn nausea/vomit (Patient not taking: Reported on 02/20/2024)   pantoprazole  (PROTONIX ) 40 MG tablet TAKE ONE TABLET BY MOUTH EVERY EVENING (Patient not taking: Reported on 02/20/2024)   polyethylene glycol (MIRALAX  / GLYCOLAX ) packet Take 17 g by mouth daily. (Patient not taking: Reported on 02/20/2024)   rosuvastatin  (CRESTOR ) 20 MG tablet TAKE ONE TABLET BY MOUTH EVERY EVENING (Patient not taking: Reported on 02/20/2024)   saccharomyces boulardii (FLORASTOR) 250 MG capsule Take 250 mg by mouth 2 (two) times daily. (Patient not taking: Reported on 02/20/2024)   sertraline  (ZOLOFT) 50 MG tablet  (Patient not taking: Reported on 02/20/2024)   traZODone (DESYREL) 50 MG tablet Take 50 mg by mouth at bedtime. (Patient not taking: Reported on 02/20/2024)   Vitamin D , Ergocalciferol , (DRISDOL ) 1.25 MG (50000 UNIT) CAPS capsule TAKE ONE CAPSULE BY MOUTH ONCE WEEKLY ON MONDAY (Patient not taking: Reported on 02/20/2024)   No facility-administered encounter medications on file as of 02/20/2024.    PAST MEDICAL HISTORY: Past Medical History:  Diagnosis Date   Acute colitis    Anxiety    B12 deficiency    Bilateral swelling of feet    Constipation    COPD (chronic obstructive pulmonary disease) (HCC)    Depression    DVT (deep venous thrombosis) (HCC) 2014, 2017   Fibroid    Gallbladder problem    Heart valve problem    High cholesterol    History of shingles    Hypertension    IBS (irritable bowel syndrome)    Joint pain    Neuropathy    PE (pulmonary thromboembolism) (HCC) 2017   SOB (shortness of breath)    Thyroid  disease    Nodules   Varicose veins    Vitamin D  deficiency     PAST SURGICAL HISTORY: Past Surgical History:  Procedure Laterality Date   ABDOMINAL HYSTERECTOMY     CHOLECYSTECTOMY N/A 11/05/2017   Procedure: LAPAROSCOPIC CHOLECYSTECTOMY WITH INTRAOPERATIVE CHOLANGIOGRAM;  Surgeon: Curvin Deward MOULD, MD;  Location: [facility] OR;  Service: General;  Laterality: N/A;   COLONOSCOPY N/A 10/05/2014   Procedure: COLONOSCOPY;  Surgeon: Lamar Bunk, MD;  Location: [facility] ENDOSCOPY;  Service: Endoscopy;  Laterality: N/A;   ERCP N/A 11/04/2017   Procedure: ENDOSCOPIC RETROGRADE CHOLANGIOPANCREATOGRAPHY (ERCP) Balloon Extraction;  Surgeon: Saintclair Jasper, MD;  Location: [facility] ENDOSCOPY;  Service: Gastroenterology;  Laterality: N/A;   LAPAROSCOPIC CHOLECYSTECTOMY W/ CHOLANGIOGRAPHY  11/05/2017   Dr. Curvin   OOPHORECTOMY     One ovary removed   SPHINCTEROTOMY  11/04/2017   Procedure: SPHINCTEROTOMY;  Surgeon: Saintclair Jasper, MD;  Location[facility] ENDOSCOPY;  Service:  Gastroenterology;;   TOTAL THYROIDECTOMY      ALLERGIES: Allergies  Allergen Reactions   Penicillins Anaphylaxis and Swelling    Has patient had a PCN reaction causing immediate rash, facial/tongue/throat swelling, SOB or lightheadedness with hypotension: yes  Has patient had a PCN reaction causing severe rash involving mucus membranes or skin necrosis: no  Has patient had a PCN reaction that required hospitalization yes  Has patient had a PCN reaction occurring within the last 10 years: no  If all of the above answers are NO, then may proceed with Cephalosporin use.  Has patient had a  PCN reaction causing immediate rash, facial/tongue/throat swelling, SOB or lightheadedness with hypotension: yes Has patient had a PCN reaction causing severe rash involving mucus membranes or skin necrosis: no Has patient had a PCN reaction that required hospitalization yes Has patient had a PCN reaction occurring within the last 10 years: no If all of the above answers are NO, then may proceed with Cephalosporin use.   Sulfa  Antibiotics Hives   Ace Inhibitors Other (See Comments)   Atorvastatin Other (See Comments)   Citrus Itching and Other (See Comments)    Can only eat if taking Benadryl    Cymbalta [Duloxetine Hcl]    Oxycodone Other (See Comments)    Hallucinations   Quinolones Other (See Comments)    Ascending thoracic aortic aneurysm, use with caution   Sulfamethoxazole -Trimethoprim  Other (See Comments)   Vicodin [Hydrocodone-Acetaminophen ] Other (See Comments)    I don't know where I am    FAMILY HISTORY: Family History  Problem Relation Age of Onset   Crohn's disease Mother    Alzheimer's disease Mother    Hypertension Father    Stroke Father    Sudden death Father    Aneurysm Father    Cancer Sister    Diabetes Sister    Hypertension Sister    Prostate cancer Brother    Stroke Brother    Drug abuse Son    Other Son        overdose in 2019   Other Son        brain  mass   Colon cancer Son     SOCIAL HISTORY: Social History   Tobacco Use   Smoking status: Former    Current packs/day: 0.00    Average packs/day: 2.0 packs/day for 40.0 years (80.0 ttl pk-yrs)    Types: Cigarettes    Start date: 05/30/1968    Quit date: 05/30/2008    Years since quitting: 15.7    Passive exposure: Current (minimal)   Smokeless tobacco: Former    Quit date: 05/12/2008  Vaping Use   Vaping status: Never Used  Substance Use Topics   Alcohol  use: Yes    Comment: Rare   Drug use: No   Social History   Social History Narrative   Are you right handed or left handed? Left hand   Are you currently employed ?    What is your current occupation? retired   Do you live at home alone? no   Who lives with you? Son    What type of home do you live in: 1 story or 2 story? one    Caffeine 1 cup a day      Objective:  Vital Signs:  BP 136/84   Pulse 89   Ht 5' 6 (1.676 m)   Wt 181 lb (82.1 kg)   SpO2 (!) 89%   BMI 29.21 kg/m   General: No acute distress.  Patient appears well-groomed.   Head:  Normocephalic/atraumatic Neck: supple Lungs: Non-labored breathing on room air    Neurological Exam: Mental status: alert and oriented, speech fluent and not dysarthric, language intact.   Cranial nerves: CN I: not tested CN II: pupils equal, round and reactive to light, visual fields intact CN III, IV, VI:  full range of motion, no nystagmus, no ptosis CN V: facial sensation intact. CN VII: upper and lower face symmetric CN VIII: hearing intact CN IX, X: uvula midline CN XI: sternocleidomastoid and trapezius muscles intact CN XII: tongue midline   Bulk & Tone: normal,  no fasciculations. Motor:  muscle strength 5/5 throughout Deep Tendon Reflexes:  2+ throughout, except at ankles which are absent.  Sensation:  Pinprick and vibration sensation diminished below the knees in bilateral lower extremities. Intact in upper extremities. Finger to nose testing:   Without dysmetria.    Gait:  Wide based gait, mildly unsteady.   Labs and Imaging review: New results: External labs 01/10/24: CBC w/ diff unremarkable CMP unremarkable TSH wnl Lipid panel: tChol 195, LDL 98, TG 145  MRI brain wo contrast (11/01/23): BRAIN: No restricted diffusion to indicate acute infarction. No intracranial mass or hemorrhage. No midline shift or extra-axial fluid collection. No cerebellar tonsillar ectopia. The central arterial and venous flow voids are patent. Mild atrophy and moderate white matter changes demonstrate some progression since the prior MRI.   VENTRICLES: No hydrocephalus.   ORBITS: The orbits are normal.   SINUSES AND MASTOIDS: The sinuses and mastoid air cells are clear.   BONES: No acute fracture or focal osseous lesion. Mild degenerative changes are present in the upper cervical spine.   IMPRESSION: 1. No acute findings. 2. Mild atrophy and moderate white matter changes demonstrate some progression since the prior MRI. This most likely reflects the sequelae of chronic microvascular ischemia.  Previously reviewed results: 02/20/23: B1 wnl B12: 608 IFE: no M protein   06/20/23: RNP ab positive SSA positive ESR 39 ANA positive (1:1280) dsDNA indeterminate   01/23/23: ANA positive (1:1280) DsDNA positive at 5 SSA > 8 positive SSB negative Anti-Sm negative RNP positive > 8.0 Scl-70 negative CCP negative RF positive ESR elevated at 36 CK 173 (130 13 years ago)   External labs: 01/01/23: CMP significant for Cr 1.07 CBC unremarkable SSA positive, SSB negative ANA positive (1:80) RF positive CRP wnl HbA1c: 6.4   Imaging: CT head wo contrast (08/31/22): FINDINGS: Brain: no acute territorial infarction, hemorrhage, or intracranial mass. Ventricles are nonenlarged. Mild atrophy. Mild to moderate white matter hypodensity consistent with chronic small vessel ischemic change.   Vascular: Hyperdense vessels.  Carotid  vascular calcification   Skull: Normal. Negative for fracture or focal lesion.   Sinuses/Orbits: Mucosal thickening in the sinuses   Other: None   IMPRESSION: 1. No CT evidence for acute intracranial abnormality. 2. Atrophy and chronic small vessel ischemic changes of the white matter.   CTA head and neck (08/25/21): CTA NECK FINDINGS   SKELETON: There is no bony spinal canal stenosis. No lytic or blastic lesion.   OTHER NECK: Right thyroid  lobe is enlarged, possibly compensatory to the atrophic or absent left lobe.   UPPER CHEST: No pneumothorax or pleural effusion. No nodules or masses.   AORTIC ARCH:   There is no calcific atherosclerosis of the aortic arch. There is no aneurysm, dissection or hemodynamically significant stenosis of the visualized portion of the aorta. Conventional 3 vessel aortic branching pattern. The visualized proximal subclavian arteries are widely patent.   RIGHT CAROTID SYSTEM: No dissection, occlusion or aneurysm. Mild atherosclerotic calcification at the carotid bifurcation without hemodynamically significant stenosis.   LEFT CAROTID SYSTEM: Normal without aneurysm, dissection or stenosis.   VERTEBRAL ARTERIES: Left dominant configuration. Both origins are clearly patent. There is no dissection, occlusion or flow-limiting stenosis to the skull base (V1-V3 segments).   CTA HEAD FINDINGS   POSTERIOR CIRCULATION:   --Vertebral arteries: Normal V4 segments.   --Inferior cerebellar arteries: Normal.   --Basilar artery: Normal.   --Superior cerebellar arteries: Normal.   --Posterior cerebral arteries (PCA): Normal.   ANTERIOR CIRCULATION:   --  Intracranial internal carotid arteries: Normal.   --Anterior cerebral arteries (ACA): Normal. Both A1 segments are present. Patent anterior communicating artery (a-comm).   --Middle cerebral arteries (MCA): Normal.   VENOUS SINUSES: As permitted by contrast timing, patent.   ANATOMIC  VARIANTS: None   Review of the MIP images confirms the above findings.   IMPRESSION: 1. No emergent large vessel occlusion or hemodynamically significant stenosis of the head or neck. 2. Chronic ischemic microangiopathy.   Cervical spine xray (08/24/21): FINDINGS: There is straightening of normal cervical lordosis. There is no evidence of cervical spine fracture or prevertebral soft tissue swelling. Alignment is normal. There is mild disc space narrowing and endplate osteophyte formation at C4-C5 and C5-C6 compatible with degenerative change.   IMPRESSION: 1. No evidence for fracture or malalignment. 2. Mild to moderate degenerative changes at C4-C5 and C5-C6.   MRI brain w/wo contrast (07/30/2018): IMPRESSION: MRI HEAD IMPRESSION:   1. No acute intracranial infarct or other abnormality identified. 2. Generalized age-related cerebral atrophy with mild chronic small vessel ischemic disease. 3. Empty sella.  Assessment/Plan:  This is Denise Cline, a 83 y.o. female with numbness and tingling in hands and feet, imbalance, falls. Patient's symptoms are likely multifactorial with contributions from arthritis, PVD, lower extremity edema, and peripheral neuropathy. 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 This is likely a major contributor to her imbalance. Her known risk factors include mixed connective tissue disease (sensory neuronopathy associated with Sjogren's and SSA positivity), pre-DM and possible prior heavy EtOH use, though this was many years ago. She did minimal PT but stopped due to having too many other current obligations. Most of the visit patient spent complaining about other doctors stating we were not helping her. I offered her neuropathic pain medication again today, which she was willing to try at a low dose.  Plan: -Gabapentin  100 mg at bedtime -Lidocaine  cream as needed -Alpha lipoic acid 600 mg daily -Consider restarting  PT  Return to clinic in 1 year   Venetia Potters, MD

## 2024-02-13 ENCOUNTER — Ambulatory Visit

## 2024-02-13 DIAGNOSIS — I712 Thoracic aortic aneurysm, without rupture, unspecified: Secondary | ICD-10-CM | POA: Diagnosis not present

## 2024-02-13 NOTE — Progress Notes (Signed)
 [address] 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 She has been followed by our clinic since 2019 and aneurysm has measured stable in size. On recent CTA scan of chest aneurysm measured 4.2 cm.   She presents today and reports that she is doing ok. She has not been talking all of her medications as prescribed.  She recently changed PCP providers and is working with them to restart her medications. She is active with helping her family members. She denies chest pain, shortness of breath and lower leg swelling.   Medications Ordered Prior to Encounter[1]   ROS: Review of Systems  Constitutional:  Negative for malaise/fatigue.  Respiratory:  Negative for cough, shortness of breath and wheezing.   Cardiovascular:  Negative for chest pain, palpitations and leg swelling.     BP 132/78   Pulse 100   Resp 20   Ht 5' 6 (1.676 m)   Wt 183 lb (83 kg)   SpO2 93% Comment: RA  BMI 29.54 kg/m   Physical Exam Constitutional:      Appearance: Normal appearance.  HENT:     Head: Normocephalic and atraumatic.  Musculoskeletal:     Cervical back: Normal range of motion.  Skin:    General: Skin is warm and dry.  Neurological:     General: No focal deficit present.     Mental Status: She is alert.      Imaging: EXAM: CTA CHEST 02/05/2024 02:30:03 PM   TECHNIQUE: CTA of the chest was performed after the administration of 75 mL of iohexol  (OMNIPAQUE ) 350 MG/ML injection. Multiplanar reformatted images are provided for review. MIP images  are provided for review. Automated exposure control, iterative reconstruction, and/or weight based adjustment of the mA/kV was utilized to reduce the radiation dose to as low as reasonably achievable.   COMPARISON: CT 11/01/2023.   CLINICAL HISTORY: Aortic aneurysm suspected.   FINDINGS:   PULMONARY ARTERIES: Pulmonary arterial system is well opacified without evidence of pulmonary embolism. Main pulmonary artery is normal in caliber. Remaining vascular structures are unremarkable.   MEDIASTINUM:   Heart: Moderate cardiomegaly. Minimal calcified plaque of the left anterior ascending and right coronary arteries.   Aorta: There is aneurysm of the ascending thoracic aorta measuring approximately 4.2 cm in AP diameter which is unchanged. Aortic root at the sinuses of Valsalva measures 3.8 cm and sinotubular junction measures 2.7 cm. Aortic arch measures 3.7 cm in diameter. The descending thoracic aorta measures 2.8 cm. There is calcified plaque over the descending thoracic aorta.   Other Mediastinal Structures: Mild prominence of the right lobe of the thyroid  extending superiorly.   LYMPH NODES: No mediastinal, hilar or axillary lymphadenopathy.   LUNGS AND PLEURA: Lungs are adequately inflated without acute airspace disease or effusion. Focal pleural calcified plaque of the right lateral upper thorax. There are several thin walled scattered pulmonary cysts most prominent over the lower lungs. Airways are unremarkable. Calcified granuloma over the right lower lobe.   UPPER  ABDOMEN: Visualized images of the upper abdomen demonstrate calcified plaque over the abdominal aorta. There are no acute findings in the upper abdomen. Stable subcentimeter exophytic cyst of the upper pole left kidney.   SOFT TISSUES AND BONES: No acute bone or soft tissue abnormality.   IMPRESSION: 1. Stable ascending thoracic aortic aneurysm measuring 4.2 cm in AP diameter. Recommend annual  imaging followup by CTA or MRA. This recommendation follows 2010 ACCF/AHA/AATS/ACR/ASA/SCA/SCAI/SIR/STS/SVM Guidelines for the Diagnosis and Management of Patients With Thoracic Aortic Disease. Circulation. 2010; 121: z733-z630 2. No pulmonary embolism. No acute cardiopulmonary disease. 3. Aortic atherosclerosis. Atherosclerotic coronary artery disease.   Electronically signed by: Toribio Agreste MD 02/05/2024 03:31 PM EST RP Workstation: [id number]     A/P: Thoracic aortic aneurysm without rupture, unspecified part -4.2 cm ascending thoracic aortic aneurysm on CTA of chest.   -We discussed the natural history and and risk factors for growth of ascending aortic aneurysms. Discussed recommendations to minimize the risk of further expansion or dissection including careful blood pressure control, avoidance of contact sports and heavy lifting, attention to lipid management.  We covered the importance of continued smoking cessation.  The patient does not yet meet surgical criteria of >5.5cm. The patient is aware of signs and symptoms of aortic dissection and when to present to the emergency department   -Follow up in one year with CTA of chest for continued surveillance    Risk Modification:  Statin:  not currently prescribed, pt unable to tolerate  Smoking cessation instruction/counseling given:  commended patient for quitting and reviewed strategies for preventing relapses  Patient was counseled on importance of Blood Pressure Control  They are instructed to contact their Primary Care Physician if they start to have blood pressure readings over 130s/90s. Do not ever stop blood pressure medications on your own, unless instructed by healthcare professional.  Please avoid use of Fluoroquinolones as this can potentially increase your risk of Aortic Rupture and/or Dissection  Patient educated on signs and symptoms of Aortic Dissection, handout also provided in AVS  Denise CHRISTELLA Rough,  PA-C 02/13/2024      [1]  Current Outpatient Medications on File Prior to Visit  Medication Sig Dispense Refill   acetaminophen  (TYLENOL ) 650 MG CR tablet Take 1,300 mg by mouth 2 (two) times a day.     aspirin  EC 81 MG tablet Take 81 mg by mouth daily. Swallow whole. (Patient not taking: Reported on 12/23/2023)     Cholecalciferol (VITAMIN D3) 125 MCG (5000 UT) CAPS Take by mouth. (Patient not taking: Reported on 12/23/2023)     Colesevelam HCl 3.75 g PACK Take by mouth. (Patient not taking: Reported on 12/23/2023)     hydrochlorothiazide  (HYDRODIURIL ) 25 MG tablet Take 25 mg by mouth daily.      lidocaine  (LIDODERM ) 5 % Place 1 patch onto the skin daily. Remove & Discard patch within 12 hours or as directed by MD (Patient not taking: Reported on 12/23/2023) 10 patch 0   losartan  (COZAAR ) 100 MG tablet Take 100 mg by mouth daily.     Magnesium  250 MG TABS Take by mouth. (Patient not taking: Reported on 12/23/2023)     Multiple Vitamins-Minerals (CENTRUM SILVER ADULT 50+) TABS Take 1 tablet by mouth daily.     ondansetron  (ZOFRAN -ODT) 4 MG disintegrating tablet 4mg  ODT q4 hours prn nausea/vomit (Patient not taking: Reported on 12/23/2023) 20 tablet 0   pantoprazole  (PROTONIX ) 40 MG tablet TAKE ONE TABLET BY MOUTH EVERY EVENING 90 tablet 3   polyethylene  glycol (MIRALAX  / GLYCOLAX ) packet Take 17 g by mouth daily. 14 each 0   rivaroxaban  (XARELTO ) 20 MG TABS tablet Take 20 mg by mouth daily with supper.     rosuvastatin  (CRESTOR ) 20 MG tablet TAKE ONE TABLET BY MOUTH EVERY EVENING (Patient not taking: Reported on 12/23/2023) 90 tablet 3   saccharomyces boulardii (FLORASTOR) 250 MG capsule Take 250 mg by mouth 2 (two) times daily. (Patient not taking: Reported on 12/23/2023)     sertraline (ZOLOFT) 50 MG tablet  (Patient not taking: Reported on 12/23/2023)     traMADol  (ULTRAM ) 50 MG tablet Take 1 tablet (50 mg total) by mouth every 6 (six) hours as needed for moderate pain. 30 tablet 0    traZODone (DESYREL) 50 MG tablet Take 50 mg by mouth at bedtime. (Patient not taking: Reported on 12/23/2023)     triamcinolone  (KENALOG ) 0.025 % cream Apply 1 application topically daily as needed for itching.     Vitamin D , Ergocalciferol , (DRISDOL ) 1.25 MG (50000 UNIT) CAPS capsule TAKE ONE CAPSULE BY MOUTH ONCE WEEKLY ON MONDAY (Patient not taking: Reported on 12/23/2023) 4 capsule 5   No current facility-administered medications on file prior to visit.

## 2024-02-13 NOTE — Patient Instructions (Signed)

## 2024-02-18 NOTE — Progress Notes (Incomplete)
 New Patient Pulmonology Office Visit   Subjective:  Patient ID: Denise Cline, female    DOB: 1941/03/05  MRN: [id number]  Referred by: Bertrum Charlie CROME, MD  CC: No chief complaint on file.   HPI Denise Cline is a 83 y.o. female with with MCTD, OSA on CPAP,    {PULM QUESTIONNAIRES (Optional):33196}  ROS  Allergies: Penicillins, Sulfa  antibiotics, Ace inhibitors, Atorvastatin, Citrus, Cymbalta [duloxetine hcl], Oxycodone, Quinolones, Sulfamethoxazole -trimethoprim , and Vicodin [hydrocodone-acetaminophen ] Current Medications[1] Past Medical History:  Diagnosis Date   Acute colitis    Anxiety    B12 deficiency    Bilateral swelling of feet    Constipation    COPD (chronic obstructive pulmonary disease) (HCC)    Depression    DVT (deep venous thrombosis) (HCC) 2014, 2017   Fibroid    Gallbladder problem    Heart valve problem    High cholesterol    History of shingles    Hypertension    IBS (irritable bowel syndrome)    Joint pain    Neuropathy    PE (pulmonary thromboembolism) (HCC) 2017   SOB (shortness of breath)    Thyroid  disease    Nodules   Varicose veins    Vitamin D  deficiency    Past Surgical History:  Procedure Laterality Date   ABDOMINAL HYSTERECTOMY     CHOLECYSTECTOMY N/A 11/05/2017   Procedure: LAPAROSCOPIC CHOLECYSTECTOMY WITH INTRAOPERATIVE CHOLANGIOGRAM;  Surgeon: Curvin Deward MOULD, MD;  Location: [facility] OR;  Service: General;  Laterality: N/A;   COLONOSCOPY N/A 10/05/2014   Procedure: COLONOSCOPY;  Surgeon: Lamar Bunk, MD;  Location: [facility] ENDOSCOPY;  Service: Endoscopy;  Laterality: N/A;   ERCP N/A 11/04/2017   Procedure: ENDOSCOPIC RETROGRADE CHOLANGIOPANCREATOGRAPHY (ERCP) Balloon Extraction;  Surgeon: Saintclair Jasper, MD;  Location: [facility] ENDOSCOPY;  Service: Gastroenterology;  Laterality: N/A;   LAPAROSCOPIC CHOLECYSTECTOMY W/ CHOLANGIOGRAPHY  11/05/2017   Dr. Curvin   OOPHORECTOMY     One ovary removed   SPHINCTEROTOMY  11/04/2017   Procedure:  SPHINCTEROTOMY;  Surgeon: Saintclair Jasper, MD;  Location: [facility] ENDOSCOPY;  Service: Gastroenterology;;   TOTAL THYROIDECTOMY     Family History  Problem Relation Age of Onset   Crohn's disease Mother    Alzheimer's disease Mother    Hypertension Father    Stroke Father    Sudden death Father    Aneurysm Father    Cancer Sister    Diabetes Sister    Hypertension Sister    Prostate cancer Brother    Stroke Brother    Drug abuse Son    Other Son        overdose in 2019   Other Son        brain mass   Colon cancer Son    Social History   Socioeconomic History   Marital status: Single    Spouse name: Not on file   Number of children: 3   Years of education: Not on file   Highest education level: Not on file  Occupational History   Not on file  Tobacco Use   Smoking status: Former    Current packs/day: 0.00    Average packs/day: 2.0 packs/day for 40.0 years (80.0 ttl pk-yrs)    Types: Cigarettes    Start date: 05/30/1968    Quit date: 05/30/2008    Years since quitting: 15.7    Passive exposure: Current (minimal)   Smokeless tobacco: Former    Quit date: 05/12/2008  Vaping Use   Vaping status: Never Used  Substance and Sexual Activity   Alcohol  use: Yes    Comment: Rare   Drug use: No   Sexual activity: Not Currently    Birth control/protection: Surgical  Other Topics Concern   Not on file  Social History Narrative   Are you right handed or left handed? Left hand   Are you currently employed ?    What is your current occupation? retired   Do you live at home alone? no   Who lives with you? Son    What type of home do you live in: 1 story or 2 story? one    Caffeine 1 cup a day   Social Drivers of Health   Tobacco Use: Medium Risk (02/13/2024)   Patient History    Smoking Tobacco Use: Former    Smokeless Tobacco Use: Former    Passive Exposure: Dispensing Optician: Not on Ship Broker Insecurity: Not on Chartered Certified Accountant Needs: Not on file   Physical Activity: Not on file  Stress: Not on file  Social Connections: Not on file  Intimate Partner Violence: Not on file  Depression (PHQ2-9): Not on file  Alcohol  Screen: Not on file  Housing: Not on file  Utilities: Not on file  Health Literacy: Not on file       Objective:  There were no vitals taken for this visit. {Pulm Vitals (Optional):32837}  Physical Exam  Diagnostic Review:  {Labs (Optional):32838}  02/05/2024 CT chest Angio LYMPH NODES: No mediastinal, hilar or axillary lymphadenopathy.  LUNGS AND PLEURA: Lungs are adequately inflated without acute airspace disease or effusion. Focal pleural calcified plaque of the right lateral upper thorax. There are several thin walled scattered pulmonary cysts most prominent over the lower lungs. Airways are unremarkable. Calcified granuloma over the right lower lobe.  1. Stable ascending thoracic aortic aneurysm measuring 4.2 cm in AP diameter. Recommend annual imaging followup by CTA or MRA. This recommendation follows 2010 ACCF/AHA/AATS/ACR/ASA/SCA/SCAI/SIR/STS/SVM Guidelines for the Diagnosis and Management of Patients With Thoracic Aortic Disease. Circulation. 2010; 121: z733-z630 2. No pulmonary embolism. No acute cardiopulmonary disease. 3. Aortic atherosclerosis. Atherosclerotic coronary artery disease.     Assessment & Plan:   Assessment & Plan   Some cystic lung disease mild  No follow-ups on file.    Marny Patch, MD Pulmonary and Critical Care Medicine [facility]    [1]  Current Outpatient Medications:    acetaminophen  (TYLENOL ) 650 MG CR tablet, Take 1,300 mg by mouth 2 (two) times a day., Disp: , Rfl:    aspirin  EC 81 MG tablet, Take 81 mg by mouth daily. Swallow whole. (Patient not taking: Reported on 12/23/2023), Disp: , Rfl:    Cholecalciferol (VITAMIN D3) 125 MCG (5000 UT) CAPS, Take by mouth. (Patient not taking: Reported on 12/23/2023), Disp: , Rfl:     Colesevelam HCl 3.75 g PACK, Take by mouth. (Patient not taking: Reported on 12/23/2023), Disp: , Rfl:    hydrochlorothiazide  (HYDRODIURIL ) 25 MG tablet, Take 25 mg by mouth daily. , Disp: , Rfl:    lidocaine  (LIDODERM ) 5 %, Place 1 patch onto the skin daily. Remove & Discard patch within 12 hours or as directed by MD (Patient not taking: Reported on 12/23/2023), Disp: 10 patch, Rfl: 0   losartan  (COZAAR ) 100 MG tablet, Take 100 mg by mouth daily., Disp: , Rfl:    Magnesium  250 MG TABS, Take by mouth. (Patient not taking: Reported on 12/23/2023), Disp: , Rfl:    Multiple  Vitamins-Minerals (CENTRUM SILVER ADULT 50+) TABS, Take 1 tablet by mouth daily., Disp: , Rfl:    ondansetron  (ZOFRAN -ODT) 4 MG disintegrating tablet, 4mg  ODT q4 hours prn nausea/vomit (Patient not taking: Reported on 12/23/2023), Disp: 20 tablet, Rfl: 0   pantoprazole  (PROTONIX ) 40 MG tablet, TAKE ONE TABLET BY MOUTH EVERY EVENING, Disp: 90 tablet, Rfl: 3   polyethylene glycol (MIRALAX  / GLYCOLAX ) packet, Take 17 g by mouth daily., Disp: 14 each, Rfl: 0   rivaroxaban  (XARELTO ) 20 MG TABS tablet, Take 20 mg by mouth daily with supper., Disp: , Rfl:    rosuvastatin  (CRESTOR ) 20 MG tablet, TAKE ONE TABLET BY MOUTH EVERY EVENING (Patient not taking: Reported on 12/23/2023), Disp: 90 tablet, Rfl: 3   saccharomyces boulardii (FLORASTOR) 250 MG capsule, Take 250 mg by mouth 2 (two) times daily. (Patient not taking: Reported on 12/23/2023), Disp: , Rfl:    sertraline (ZOLOFT) 50 MG tablet, , Disp: , Rfl:    traMADol  (ULTRAM ) 50 MG tablet, Take 1 tablet (50 mg total) by mouth every 6 (six) hours as needed for moderate pain., Disp: 30 tablet, Rfl: 0   traZODone (DESYREL) 50 MG tablet, Take 50 mg by mouth at bedtime. (Patient not taking: Reported on 12/23/2023), Disp: , Rfl:    triamcinolone  (KENALOG ) 0.025 % cream, Apply 1 application topically daily as needed for itching., Disp: , Rfl:    Vitamin D , Ergocalciferol , (DRISDOL ) 1.25 MG (50000  UNIT) CAPS capsule, TAKE ONE CAPSULE BY MOUTH ONCE WEEKLY ON MONDAY (Patient not taking: Reported on 12/23/2023), Disp: 4 capsule, Rfl: 5

## 2024-02-19 ENCOUNTER — Ambulatory Visit

## 2024-02-19 VITALS — BP 128/64 | HR 64 | Temp 97.7°F | Ht 66.0 in | Wt 182.6 lb

## 2024-02-19 DIAGNOSIS — I2699 Other pulmonary embolism without acute cor pulmonale: Secondary | ICD-10-CM

## 2024-02-19 NOTE — Patient Instructions (Addendum)
 Dear Denise Cline,   Your last CT scan did not show clots in the lungs and you are on anticoagulation as well. You have cysts in your lungs that It can be associated to your Rheumatoid arthritis. Given your age, and no significant symptoms, I will just monitor.  Continue your medications.   I will see you in 6 months.

## 2024-02-20 ENCOUNTER — Ambulatory Visit: Admitting: Neurology

## 2024-02-20 ENCOUNTER — Encounter: Payer: Self-pay | Admitting: Neurology

## 2024-02-20 VITALS — BP 136/84 | HR 89 | Ht 66.0 in | Wt 181.0 lb

## 2024-02-20 DIAGNOSIS — M351 Other overlap syndromes: Secondary | ICD-10-CM

## 2024-02-20 DIAGNOSIS — G629 Polyneuropathy, unspecified: Secondary | ICD-10-CM | POA: Diagnosis not present

## 2024-02-20 DIAGNOSIS — M1990 Unspecified osteoarthritis, unspecified site: Secondary | ICD-10-CM

## 2024-02-20 DIAGNOSIS — I739 Peripheral vascular disease, unspecified: Secondary | ICD-10-CM | POA: Diagnosis not present

## 2024-02-20 MED ORDER — GABAPENTIN 100 MG PO CAPS: 100.0000 mg | ORAL_CAPSULE | Freq: Every day | ORAL | 5 refills | Status: AC

## 2024-02-20 NOTE — Patient Instructions (Signed)
 Gabapentin  100 mg at bedtime  You can also try Lidocaine  cream as needed. Apply wear you have pain, tingling, or burning. Wear gloves to prevent your hands being numb. This can be bought over the counter at any drug store or online.  Alpha lipoic acid 600mg  daily has some research data suggesting it helps with nerve health. No major side effects other than <1% of people report upset stomach. This can be taken twice per day (1200mg  daily) if no relief obtained. You can buy this over the counter or online.  The physicians and staff at [facility] are committed to providing excellent care. You may receive a survey requesting feedback about your experience at our office. We strive to receive very good responses to the survey questions. If you feel that your experience would prevent you from giving the office a very good  response, please contact our office to try to remedy the situation. We may be reached at [phone]. Thank you for taking the time out of your busy day to complete the survey.  Venetia Potters, MD [facility]  Preventing Falls at [facility] are common, often dreaded events in the lives of older people. Aside from the obvious injuries and even death that may result, fall can cause wide-ranging consequences including loss of independence, mental decline, decreased activity and mobility. Younger people are also at risk of falling, especially those with chronic illnesses and fatigue.  Ways to reduce risk for falling Examine diet and medications. Warm foods and alcohol  dilate blood vessels, which can lead to dizziness when standing. Sleep aids, antidepressants and pain medications can also increase the likelihood of a fall.  Get a vision exam. Poor vision, cataracts and glaucoma increase the chances of falling.  Check foot gear. Shoes should fit snugly and have a sturdy, nonskid sole and a broad, low heel  Participate in a physician-approved exercise program to build and  maintain muscle strength and improve balance and coordination. Programs that use ankle weights or stretch bands are excellent for muscle-strengthening. Water  aerobics programs and low-impact Tai Chi programs have also been shown to improve balance and coordination.  Increase vitamin D  intake. Vitamin D  improves muscle strength and increases the amount of calcium  the body is able to absorb and deposit in bones.  How to prevent falls from common hazards Floors - Remove all loose wires, cords, and throw rugs. Minimize clutter. Make sure rugs are anchored and smooth. Keep furniture in its usual place.  Chairs -- Use chairs with straight backs, armrests and firm seats. Add firm cushions to existing pieces to add height.  Bathroom - Install grab bars and non-skid tape in the tub or shower. Use a bathtub transfer bench or a shower chair with a back support Use an elevated toilet seat and/or safety rails to assist standing from a low surface. Do not use towel racks or bathroom tissue holders to help you stand.  Lighting - Make sure halls, stairways, and entrances are well-lit. Install a night light in your bathroom or hallway. Make sure there is a light switch at the top and bottom of the staircase. Turn lights on if you get up in the middle of the night. Make sure lamps or light switches are within reach of the bed if you have to get up during the night.  Kitchen - Install non-skid rubber mats near the sink and stove. Clean spills immediately. Store frequently used utensils, pots, pans between waist and eye level. This helps prevent reaching  and bending. Sit when getting things out of lower cupboards.  Living room/ Bedrooms - Place furniture with wide spaces in between, giving enough room to move around. Establish a route through the living room that gives you something to hold onto as you walk.  Stairs - Make sure treads, rails, and rugs are secure. Install a rail on both sides of the stairs. If stairs  are a threat, it might be helpful to arrange most of your activities on the lower level to reduce the number of times you must climb the stairs.  Entrances and doorways - Install metal handles on the walls adjacent to the doorknobs of all doors to make it more secure as you travel through the doorway.  Tips for maintaining balance Keep at least one hand free at all times. Try using a backpack or fanny pack to hold things rather than carrying them in your hands. Never carry objects in both hands when walking as this interferes with keeping your balance.  Attempt to swing both arms from front to back while walking. This might require a conscious effort if Parkinson's disease has diminished your movement. It will, however, help you to maintain balance and posture, and reduce fatigue.  Consciously lift your feet off of the ground when walking. Shuffling and dragging of the feet is a common culprit in losing your balance.  When trying to navigate turns, use a U technique of facing forward and making a wide turn, rather than pivoting sharply.  Try to stand with your feet shoulder-length apart. When your feet are close together for any length of time, you increase your risk of losing your balance and falling.  Do one thing at a time. Don't try to walk and accomplish another task, such as reading or looking around. The decrease in your automatic reflexes complicates motor function, so the less distraction, the better.  Do not wear rubber or gripping soled shoes, they might catch on the floor and cause tripping.  Move slowly when changing positions. Use deliberate, concentrated movements and, if needed, use a grab bar or walking aid. Count 15 seconds between each movement. For example, when rising from a seated position, wait 15 seconds after standing to begin walking.  If balance is a continuous problem, you might want to consider a walking aid such as a cane, walking stick, or walker. Once you've  mastered walking with help, you might be ready to try it on your own again.

## 2024-02-21 ENCOUNTER — Other Ambulatory Visit: Payer: Self-pay | Admitting: Student

## 2024-02-21 DIAGNOSIS — R1033 Periumbilical pain: Secondary | ICD-10-CM

## 2024-02-24 ENCOUNTER — Telehealth: Payer: Self-pay | Admitting: *Deleted

## 2024-02-24 NOTE — Telephone Encounter (Signed)
 Patient contacted the office and left message stating she would like a call back to discuss possible labs. 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 Patient would like a call back to discuss.

## 2024-02-24 NOTE — Telephone Encounter (Signed)
 This is not something that we assess for-she should contact her PCP for further evaluation.

## 2024-02-24 NOTE — Telephone Encounter (Signed)
 Left message to advise patient this is not something that we assess for-she should contact her PCP for further evaluation.

## 2024-02-28 ENCOUNTER — Ambulatory Visit: Admitting: Neurology

## 2024-03-02 ENCOUNTER — Ambulatory Visit
Admission: RE | Admit: 2024-03-02 | Discharge: 2024-03-02 | Disposition: A | Source: Ambulatory Visit | Attending: Student

## 2024-03-02 DIAGNOSIS — R1033 Periumbilical pain: Secondary | ICD-10-CM

## 2024-03-02 MED ORDER — IOPAMIDOL (ISOVUE-300) INJECTION 61%
100.0000 mL | Freq: Once | INTRAVENOUS | Status: AC | PRN
  Administered 2024-03-02: 100 mL via INTRAVENOUS

## 2024-03-06 ENCOUNTER — Ambulatory Visit: Admitting: Licensed Clinical Social Worker

## 2024-03-06 ENCOUNTER — Other Ambulatory Visit: Payer: Self-pay | Admitting: Family Medicine

## 2024-03-06 DIAGNOSIS — E2839 Other primary ovarian failure: Secondary | ICD-10-CM

## 2024-03-09 ENCOUNTER — Ambulatory Visit: Admitting: Licensed Clinical Social Worker

## 2024-03-09 DIAGNOSIS — F4310 Post-traumatic stress disorder, unspecified: Secondary | ICD-10-CM | POA: Diagnosis not present
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
## 2024-03-12 ENCOUNTER — Ambulatory Visit
Admission: RE | Admit: 2024-03-12 | Discharge: 2024-03-12 | Disposition: A | Discharge status: Home / Self Care | Source: Ambulatory Visit | Attending: Family Medicine | Admitting: Family Medicine

## 2024-03-12 DIAGNOSIS — E2839 Other primary ovarian failure: Secondary | ICD-10-CM | POA: Diagnosis present

## 2024-03-24 ENCOUNTER — Ambulatory Visit: Admitting: Licensed Clinical Social Worker

## 2024-04-07 ENCOUNTER — Ambulatory Visit: Admitting: Licensed Clinical Social Worker

## 2024-04-07 DIAGNOSIS — F4310 Post-traumatic stress disorder, unspecified: Secondary | ICD-10-CM | POA: Diagnosis not present

## 2024-04-21 ENCOUNTER — Ambulatory Visit: Admitting: Licensed Clinical Social Worker

## 2024-06-23 ENCOUNTER — Ambulatory Visit: Admitting: Rheumatology
